# Patient Record
Sex: Male | Born: 1950
Health system: Southern US, Community
[De-identification: ages and names within clinical notes are randomized; demographics above are authoritative.]

## PROBLEM LIST (undated history)

## (undated) DIAGNOSIS — I10 Essential (primary) hypertension: Secondary | ICD-10-CM

## (undated) DIAGNOSIS — N210 Calculus in bladder: Secondary | ICD-10-CM

## (undated) DIAGNOSIS — I2510 Atherosclerotic heart disease of native coronary artery without angina pectoris: Secondary | ICD-10-CM

## (undated) DIAGNOSIS — J45909 Unspecified asthma, uncomplicated: Secondary | ICD-10-CM

## (undated) DIAGNOSIS — J301 Allergic rhinitis due to pollen: Secondary | ICD-10-CM

## (undated) DIAGNOSIS — Z973 Presence of spectacles and contact lenses: Secondary | ICD-10-CM

## (undated) DIAGNOSIS — T4145XA Adverse effect of unspecified anesthetic, initial encounter: Secondary | ICD-10-CM

## (undated) DIAGNOSIS — R112 Nausea with vomiting, unspecified: Secondary | ICD-10-CM

## (undated) DIAGNOSIS — L089 Local infection of the skin and subcutaneous tissue, unspecified: Secondary | ICD-10-CM

## (undated) DIAGNOSIS — M549 Dorsalgia, unspecified: Secondary | ICD-10-CM

## (undated) DIAGNOSIS — K219 Gastro-esophageal reflux disease without esophagitis: Secondary | ICD-10-CM

## (undated) DIAGNOSIS — M1990 Unspecified osteoarthritis, unspecified site: Secondary | ICD-10-CM

## (undated) DIAGNOSIS — G8929 Other chronic pain: Secondary | ICD-10-CM

## (undated) DIAGNOSIS — E785 Hyperlipidemia, unspecified: Secondary | ICD-10-CM

## (undated) DIAGNOSIS — Z860100 Personal history of colon polyps, unspecified: Secondary | ICD-10-CM

## (undated) DIAGNOSIS — C61 Malignant neoplasm of prostate: Principal | ICD-10-CM

## (undated) DIAGNOSIS — Z8546 Personal history of malignant neoplasm of prostate: Secondary | ICD-10-CM

## (undated) DIAGNOSIS — J189 Pneumonia, unspecified organism: Secondary | ICD-10-CM

## (undated) DIAGNOSIS — M6510 Other infective (teno)synovitis, unspecified site: Secondary | ICD-10-CM

## (undated) DIAGNOSIS — Z87442 Personal history of urinary calculi: Secondary | ICD-10-CM

## (undated) DIAGNOSIS — T7840XA Allergy, unspecified, initial encounter: Secondary | ICD-10-CM

## (undated) DIAGNOSIS — T8859XA Other complications of anesthesia, initial encounter: Secondary | ICD-10-CM

## (undated) DIAGNOSIS — E7800 Pure hypercholesterolemia, unspecified: Secondary | ICD-10-CM

## (undated) HISTORY — PX: KNEE ARTHROSCOPY: SUR90

## (undated) HISTORY — DX: Local infection of the skin and subcutaneous tissue, unspecified: L08.9

## (undated) HISTORY — PX: COLONOSCOPY: SHX174

## (undated) HISTORY — PX: ACHILLES TENDON SURGERY: SHX542

## (undated) HISTORY — PX: VASECTOMY: SHX75

## (undated) HISTORY — DX: Other infective (teno)synovitis, unspecified site: M65.10

## (undated) HISTORY — DX: Allergy, unspecified, initial encounter: T78.40XA

## (undated) HISTORY — PX: EYE SURGERY: SHX253

## (undated) HISTORY — PX: POSTERIOR LAMINECTOMY / DECOMPRESSION LUMBAR SPINE: SUR740

## (undated) HISTORY — PX: TONSILLECTOMY: SUR1361

## (undated) HISTORY — PX: FINGER SURGERY: SHX640

---
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
## 2002-06-01 HISTORY — PX: CARDIAC CATHETERIZATION: SHX172

## 2005-01-09 ENCOUNTER — Ambulatory Visit: Payer: Self-pay | Admitting: Internal Medicine

## 2005-01-27 ENCOUNTER — Ambulatory Visit: Payer: Self-pay | Admitting: Internal Medicine

## 2005-01-27 ENCOUNTER — Encounter (INDEPENDENT_AMBULATORY_CARE_PROVIDER_SITE_OTHER): Payer: Self-pay | Admitting: *Deleted

## 2006-08-17 ENCOUNTER — Ambulatory Visit (HOSPITAL_COMMUNITY): Admission: RE | Admit: 2006-08-17 | Discharge: 2006-08-17 | Payer: Self-pay | Admitting: Cardiology

## 2006-08-19 ENCOUNTER — Ambulatory Visit (HOSPITAL_COMMUNITY): Admission: RE | Admit: 2006-08-19 | Discharge: 2006-08-19 | Payer: Self-pay | Admitting: Cardiology

## 2007-05-25 ENCOUNTER — Ambulatory Visit (HOSPITAL_BASED_OUTPATIENT_CLINIC_OR_DEPARTMENT_OTHER): Admission: RE | Admit: 2007-05-25 | Discharge: 2007-05-25 | Payer: Self-pay | Admitting: Orthopedic Surgery

## 2009-09-03 DIAGNOSIS — G5600 Carpal tunnel syndrome, unspecified upper limb: Secondary | ICD-10-CM | POA: Insufficient documentation

## 2009-09-03 DIAGNOSIS — H539 Unspecified visual disturbance: Secondary | ICD-10-CM | POA: Insufficient documentation

## 2009-09-03 DIAGNOSIS — E785 Hyperlipidemia, unspecified: Secondary | ICD-10-CM | POA: Insufficient documentation

## 2009-09-03 DIAGNOSIS — M1990 Unspecified osteoarthritis, unspecified site: Secondary | ICD-10-CM | POA: Insufficient documentation

## 2009-12-19 DIAGNOSIS — K219 Gastro-esophageal reflux disease without esophagitis: Secondary | ICD-10-CM | POA: Insufficient documentation

## 2009-12-19 DIAGNOSIS — H04129 Dry eye syndrome of unspecified lacrimal gland: Secondary | ICD-10-CM | POA: Insufficient documentation

## 2009-12-19 DIAGNOSIS — J309 Allergic rhinitis, unspecified: Secondary | ICD-10-CM | POA: Insufficient documentation

## 2009-12-19 DIAGNOSIS — N529 Male erectile dysfunction, unspecified: Secondary | ICD-10-CM | POA: Insufficient documentation

## 2009-12-23 ENCOUNTER — Encounter: Payer: Self-pay | Admitting: Internal Medicine

## 2009-12-30 ENCOUNTER — Encounter: Payer: Self-pay | Admitting: Internal Medicine

## 2010-01-31 ENCOUNTER — Encounter (INDEPENDENT_AMBULATORY_CARE_PROVIDER_SITE_OTHER): Payer: Self-pay

## 2010-02-04 ENCOUNTER — Ambulatory Visit: Payer: Self-pay | Admitting: Internal Medicine

## 2010-02-07 ENCOUNTER — Telehealth (INDEPENDENT_AMBULATORY_CARE_PROVIDER_SITE_OTHER): Payer: Self-pay | Admitting: *Deleted

## 2010-02-19 ENCOUNTER — Ambulatory Visit: Payer: Self-pay | Admitting: Internal Medicine

## 2010-02-20 ENCOUNTER — Encounter: Payer: Self-pay | Admitting: Internal Medicine

## 2010-06-22 ENCOUNTER — Encounter: Payer: Self-pay | Admitting: Cardiovascular Disease

## 2010-07-01 NOTE — Letter (Signed)
Summary: Previsit letter  [facility]  [address] [place], [place] [address]   Phone: [phone]  Fax: [phone]       12/30/2009 MRN: [id number]  [facility] [address] [place], [place]  [address]  Dear Terry [name],  Welcome to the Gastroenterology Division at [facility].    You are scheduled to see a nurse for your pre-procedure visit on 02-04-10 at 4:30PM on the 3rd floor at [facility], [address]  We ask that you try to arrive at our office 15 minutes prior to your appointment time to allow for check-in.  Your nurse visit will consist of discussing your medical and surgical history, your immediate family medical history, and your medications.    Please bring a complete list of all your medications or, if you prefer, bring the medication bottles and we will list them.  We will need to be aware of both prescribed and over the counter drugs.  We will need to know exact dosage information as well.  If you are on blood thinners (Coumadin, Plavix, Aggrenox, Ticlid, etc.) please call our office today/prior to your appointment, as we need to consult with your physician about holding your medication.   Please be prepared to read and sign documents such as consent forms, a financial agreement, and acknowledgement forms.  If necessary, and with your consent, a friend or relative is welcome to sit-in on the nurse visit with you.  Please bring your insurance card so that we may make a copy of it.  If your insurance requires a referral to see a specialist, please bring your referral form from your primary care physician.  No co-pay is required for this nurse visit.     If you cannot keep your appointment, please call [phone] to cancel or reschedule prior to your appointment date.  This allows [place] the opportunity to schedule an appointment for another patient in need of care.    Thank you for choosing  Gastroenterology for your medical needs.   We appreciate the opportunity to care for you.  Please visit [place] at our website  to learn more about our practice.                     Sincerely.                                                                                                                   The Gastroenterology Division

## 2010-07-01 NOTE — Miscellaneous (Signed)
Summary: Lec previsit  Clinical Lists Changes  Medications: Added new medication of MOVIPREP 100 GM  SOLR (PEG-KCL-NACL-NASULF-NA ASC-C) As per prep instructions. - Signed Rx of MOVIPREP 100 GM  SOLR (PEG-KCL-NACL-NASULF-NA ASC-C) As per prep instructions.;  #1 x 0;  Signed;  Entered by: Ulis Rias RN;  Authorized by: Hilarie Fredrickson MD;  Method used: Electronically to [facility] # [address] [place], [place]  [address], Ph: [phone], Fax: [phone] Allergies: Added new allergy or adverse reaction of PENICILLIN Observations: Added new observation of ALLERGY REV: Done (02/04/2010 16:16) Added new observation of NKA: F (02/04/2010 16:16)    Prescriptions: MOVIPREP 100 GM  SOLR (PEG-KCL-NACL-NASULF-NA ASC-C) As per prep instructions.  #1 x 0   Entered by:   Ulis Rias RN   Authorized by:   Hilarie Fredrickson MD   Signed by:   Ulis Rias RN on 02/04/2010   Method used:   Electronically to        [facility] # [address]* (retail)       [address]       [place], [place]  [address]       Ph: [phone]       Fax: [phone]   RxID:   [id number]

## 2010-07-01 NOTE — Procedures (Signed)
Summary: Colonoscopy  Patient: [name] Note: All result statuses are Final unless otherwise noted.  Tests: (1) Colonoscopy (COL)   COL Colonoscopy           DONE     [name]     [address]     [place], [place]  [address]           COLONOSCOPY PROCEDURE REPORT           PATIENT:  Terry [name], Terry [name]  MR#:  [id number]     BIRTHDATE:  12-26-1950, 59 yrs. old  GENDER:  male     ENDOSCOPIST:  Wilhemina Bonito. Eda Keys, MD     REF. BY:  Surveillance Program Recall     PROCEDURE DATE:  02/19/2010     PROCEDURE:  Colonoscopy with snare polypectomy x 1     ASA CLASS:  Class II     INDICATIONS:  history of pre-cancerous (adenomatous) colon polyps,     surveillance and high-risk screening ; index exam 12-2004 w/ small     adenomas     MEDICATIONS:   Fentanyl 100 mcg IV, Versed 10 mg IV           DESCRIPTION OF PROCEDURE:   After the risks benefits and     alternatives of the procedure were thoroughly explained, informed     consent was obtained.  Digital rectal exam was performed and     revealed no abnormalities.   The LB CF-[id number] endoscope     was introduced through the anus and advanced to the cecum, which     was identified by both the appendix and ileocecal valve, without     limitations.Time to cecum = 2:55 min. The quality of the prep was     excellent, using MoviPrep.  The instrument was then slowly     withdrawn (time = 10:13 min) as the colon was fully examined.     <<PROCEDUREIMAGES>>           FINDINGS:  A diminutive polyp was found in the mid transverse     colon. Polyp was snared without cautery. Retrieval was successful.     Moderate diverticulosis was found in the sigmoid colon.     Retroflexed views in the rectum revealed no abnormalities.    The     scope was then withdrawn from the patient and the procedure     completed.           COMPLICATIONS:  None     ENDOSCOPIC IMPRESSION:     1) Diminutive polyp in the mid transverse colon - removed     2)  Moderate diverticulosis in the sigmoid colon           RECOMMENDATIONS:     1) Follow up colonoscopy in 5 years           ______________________________     Wilhemina Bonito. Eda Keys, MD           CC:  Creola Corn, MD; The Patient           n.     eSIGNED:   Wilhemina Bonito. Eda Keys at 02/19/2010 10:25 AM           Philis Fendt, [id number]  Note: An exclamation mark (!) indicates a result that was not dispersed into the flowsheet. Document Creation Date: 02/19/2010 10:26 AM _______________________________________________________________________  (1) Order result status: Final Collection or observation date-time: 02/19/2010 10:19 Requested date-time:  Receipt  date-time:  Reported date-time:  Referring Physician:   Ordering Physician: Fransico Setters [phone]) Specimen Source:  Source: Launa Grill Order Number: [id number] Lab site:   Appended Document: Colonoscopy     Procedures Next Due Date:    Colonoscopy: 03/2015

## 2010-07-01 NOTE — Letter (Signed)
Summary: Patient Notice- Polyp Results  [facility]  [address] [place], [place] [address]   Phone: [phone]  Fax: [phone]        February 20, 2010 MRN: [id number]    [facility] [address] [place], [place]  [address]    Dear Mr. Romey,  I am pleased to inform you that the colon polyp(s) removed during your recent colonoscopy was (were) found to be benign (no cancer detected) upon pathologic examination.  I recommend you have a repeat colonoscopy examination in 5 years to look for recurrent polyps, as having colon polyps increases your risk for having recurrent polyps or even colon cancer in the future.  Should you develop new or worsening symptoms of abdominal pain, bowel habit changes or bleeding from the rectum or bowels, please schedule an evaluation with either your primary care physician or with me.  Additional information/recommendations:  __ No further action with gastroenterology is needed at this time. Please      follow-up with your primary care physician for your other healthcare      needs.    Please call us if you are having persistent problems or have questions about your condition that have not been fully answered at this time.  Sincerely,  Hilarie Fredrickson MD  This letter has been electronically signed by your physician.  Appended Document: Patient Notice- Polyp Results letter mailed

## 2010-07-01 NOTE — Progress Notes (Signed)
Summary: prep concern  Phone Note Call from Patient Call back at Work Phone [phone]   Caller: Patient Call For: Dr. Marina Goodell Reason for Call: Talk to Nurse Summary of Call: prep is not at pharmacy... [facility] [address] Initial call taken by: Vallarie Mare,  February 07, 2010 1:58 PM  Follow-up for Phone Call        [facility] [address]  Left message on pt's voice mail. Follow-up by: Ezra Sites RN,  February 07, 2010 2:52 PM

## 2010-07-01 NOTE — Letter (Signed)
Summary: [facility] Instructions  [facility]  [address] [place], [place] [address]   Phone: [phone]  Fax: [phone]       Terry [name]    Oct 21, 1950    MRN: [id number]        Procedure Day Dorna Bloom:  Wednesday 02/19/2061     Arrival Time: 8:30 am      Procedure Time: 9:30 am     Location of Procedure:                    _x _  [facility] (4th Floor)                        PREPARATION FOR COLONOSCOPY WITH MOVIPREP   Starting 5 days prior to your procedure Friday 9/16 do not eat nuts, seeds, popcorn, corn, beans, peas,  salads, or any raw vegetables.  Do not take any fiber supplements (e.g. Metamucil, Citrucel, and Benefiber).  THE DAY BEFORE YOUR PROCEDURE         DATE: Tuesday 9/20  1.  Drink clear liquids the entire day-NO SOLID FOOD  2.  Do not drink anything colored red or purple.  Avoid juices with pulp.  No orange juice.  3.  Drink at least 64 oz. (8 glasses) of fluid/clear liquids during the day to prevent dehydration and help the prep work efficiently.  CLEAR LIQUIDS INCLUDE: Water Jello Ice Popsicles Tea (sugar ok, no milk/cream) Powdered fruit flavored drinks Coffee (sugar ok, no milk/cream) Gatorade Juice: apple, white grape, white cranberry  Lemonade Clear bullion, consomm, broth Carbonated beverages (any kind) Strained chicken noodle soup Hard Candy                             4.  In the morning, mix first dose of MoviPrep solution:    Empty 1 Pouch A and 1 Pouch B into the disposable container    Add lukewarm drinking water to the top line of the container. Mix to dissolve    Refrigerate (mixed solution should be used within 24 hrs)  5.  Begin drinking the prep at 5:00 p.m. The MoviPrep container is divided by 4 marks.   Every 15 minutes drink the solution down to the next mark (approximately 8 oz) until the full liter is complete.   6.  Follow completed prep with 16 oz of clear liquid of your choice (Nothing  red or purple).  Continue to drink clear liquids until bedtime.  7.  Before going to bed, mix second dose of MoviPrep solution:    Empty 1 Pouch A and 1 Pouch B into the disposable container    Add lukewarm drinking water to the top line of the container. Mix to dissolve    Refrigerate  THE DAY OF YOUR PROCEDURE      DATE: Wednesday 9/21 Beginning at 4:30 am (5 hours before procedure):         1. Every 15 minutes, drink the solution down to the next mark (approx 8 oz) until the full liter is complete.  2. Follow completed prep with 16 oz. of clear liquid of your choice.    3. You may drink clear liquids until 7:30 am (2 HOURS BEFORE PROCEDURE).   MEDICATION INSTRUCTIONS  Unless otherwise instructed, you should take regular prescription medications with a small sip of water   as early as possible the morning of your  procedure.          OTHER INSTRUCTIONS  You will need a responsible adult at least 62 years of age to accompany you and drive you home.   This person must remain in the waiting room during your procedure.  Wear loose fitting clothing that is easily removed.  Leave jewelry and other valuables at home.  However, you may wish to bring a book to read or  an iPod/MP3 player to listen to music as you wait for your procedure to start.  Remove all body piercing jewelry and leave at home.  Total time from sign-in until discharge is approximately 2-3 hours.  You should go home directly after your procedure and rest.  You can resume normal activities the  day after your procedure.  The day of your procedure you should not:   Drive   Make legal decisions   Operate machinery   Drink alcohol   Return to work  You will receive specific instructions about eating, activities and medications before you leave.    The above instructions have been reviewed and explained to me by   Ulis Rias RN  February 04, 2061 4:52 PM     I fully understand and can  verbalize these instructions _____________________________ Date _________

## 2010-07-01 NOTE — Letter (Signed)
Summary: Colonoscopy Letter  [facility]  [address] [place], [place] [address]   Phone: [phone]  Fax: [phone]      December 23, 2009 MRN: [id number]   [facility] [address] [place], [place]  [address]   Dear Ms. Hurtado,   According to your medical record, it is time for you to schedule a Colonoscopy. The American Cancer Society recommends this procedure as a method to detect early colon cancer. Patients with a family history of colon cancer, or a personal history of colon polyps or inflammatory bowel disease are at increased risk.  This letter has been generated based on the recommendations made at the time of your procedure. If you feel that in your particular situation this may no longer apply, please contact our office.  Please call our office at [phone] to schedule this appointment or to update your records at your earliest convenience.  Thank you for cooperating with [place] to provide you with the very best care possible.   Sincerely,  Wilhemina Bonito. Marina Goodell, M.D.  [facility] [phone]

## 2010-10-14 NOTE — Op Note (Signed)
Terry [name], Terry [name]                ACCOUNT NO.:  [id number]   MEDICAL RECORD NO.:  [id number]          PATIENT TYPE:  AMB   LOCATION:  DSC                          FACILITY:  [facility]   PHYSICIAN:  Harvie Junior, M.D.   DATE OF BIRTH:  06-28-1950   DATE OF PROCEDURE:  05/25/2007  DATE OF DISCHARGE:                               OPERATIVE REPORT   PREOPERATIVE DIAGNOSIS:  Lateral meniscal tear.   POSTOPERATIVE DIAGNOSES:  1. Partial posterior horn medial meniscal tear.  2. Partial posterior horn lateral meniscal tear.   PRINCIPAL PROCEDURE:  1. Partial posterior horn medial meniscectomy.  2. Partial posterior horn lateral meniscectomy.   SURGEON:  Harvie Junior, M.D.   ASSISTANT:   ANESTHESIA:  General.   BRIEF HISTORY:  [name] is a 60 year old male with long history of  having had significant left knee pain.  We treated conservatively for a  period of time.  He was having intermittent locking, catching, grabbing.  Because of continued complaints of pain, he was ultimately taken to the  operating room for operative knee arthroscopy.   PROCEDURE:  Patient was taken to the operating room.  After adequate  anesthesia was obtained with general __________ , patient placed supine  on the operating table.  The left leg was prepped and draped in the  usual sterile fashion.   Following this routine, arthroscopic examination of the knee revealed  that there was an obvious posterior horn medial meniscal tear with a  flap that was displacing into the knee.  This was debrided back to a  smooth and stable rim.   Attention was then turned laterally where there was a posterior horn  lateral meniscal tear which was debrided back to a smooth and stable  rim.  The articular cartilage both medial and lateral looked good.  Patellofemoral tracking was good, and patellofemoral cartilage looked  good.  There was some significant sort of debris in the knee.  It was  soft of soft tissue,  synovial; had a little bit of look like it could  have had some cartilage pieces, but I did not see any donor site within  the knee.   The knee was copiously and thoroughly irrigated with 6 liters of normal  saline irrigation, suctioned dry.  The arthroscopic portals were closed  with Band-Aids.  Sterile compressive dressing was applied.   Patient was taken to recovery room, was noted to be in satisfactory  condition.  Estimated blood loss for the procedure was __________      Harvie Junior, M.D.  Electronically Signed     JLG/MEDQ  D:  05/25/2007  T:  05/25/2007  Job:  [id number]

## 2010-12-29 DIAGNOSIS — F430 Acute stress reaction: Secondary | ICD-10-CM | POA: Insufficient documentation

## 2011-03-06 LAB — POCT HEMOGLOBIN-HEMACUE
Hemoglobin: 15.7 — ABNORMAL HIGH
Operator id: 128471

## 2011-09-09 ENCOUNTER — Other Ambulatory Visit: Payer: Self-pay

## 2011-09-09 DIAGNOSIS — M79672 Pain in left foot: Secondary | ICD-10-CM

## 2011-09-11 ENCOUNTER — Other Ambulatory Visit: Payer: Self-pay

## 2011-09-14 ENCOUNTER — Ambulatory Visit
Admission: RE | Admit: 2011-09-14 | Discharge: 2011-09-14 | Disposition: A | Discharge status: Home / Self Care | Payer: 59 | Source: Ambulatory Visit

## 2011-09-14 DIAGNOSIS — M79672 Pain in left foot: Secondary | ICD-10-CM

## 2012-04-05 DIAGNOSIS — M549 Dorsalgia, unspecified: Secondary | ICD-10-CM | POA: Insufficient documentation

## 2012-04-05 DIAGNOSIS — M79641 Pain in right hand: Secondary | ICD-10-CM | POA: Insufficient documentation

## 2013-01-23 DIAGNOSIS — R739 Hyperglycemia, unspecified: Secondary | ICD-10-CM | POA: Insufficient documentation

## 2013-01-23 DIAGNOSIS — R7402 Elevation of levels of lactic acid dehydrogenase (LDH): Secondary | ICD-10-CM | POA: Insufficient documentation

## 2013-01-23 DIAGNOSIS — Z5189 Encounter for other specified aftercare: Secondary | ICD-10-CM | POA: Insufficient documentation

## 2013-01-23 DIAGNOSIS — D229 Melanocytic nevi, unspecified: Secondary | ICD-10-CM | POA: Insufficient documentation

## 2013-01-23 DIAGNOSIS — Z1389 Encounter for screening for other disorder: Secondary | ICD-10-CM | POA: Insufficient documentation

## 2013-01-23 DIAGNOSIS — Z4889 Encounter for other specified surgical aftercare: Secondary | ICD-10-CM | POA: Insufficient documentation

## 2013-04-13 HISTORY — PX: PROSTATE BIOPSY: SHX241

## 2013-05-08 ENCOUNTER — Encounter: Payer: Self-pay | Admitting: Radiation Oncology

## 2013-05-08 DIAGNOSIS — C61 Malignant neoplasm of prostate: Secondary | ICD-10-CM | POA: Insufficient documentation

## 2013-05-08 NOTE — Progress Notes (Signed)
GU Location of Tumor / Histology:  prostate  If Prostate Cancer, Gleason Score is (3 + 3) and PSA is (5.08 on 03/22/13)  Patient presented 1.5 months ago with signs/symptoms of: elevated PSA  Biopsies of prostate (if applicable) revealed: adenocarcinoma, 1/12 cores positive for adenocarcinoma, right lateral mid core- small focus atypical glands, Gleason 3+3=6, volume 55.4 cc  Past/Anticipated interventions by urology, if any: none  Past/Anticipated interventions by medical oncology, if any: none  Weight changes, if any: no  Bowel/Bladder complaints, if any:  Hesitancy, straining to void, sensation of not emptying bladder, frequency, stream starts/stops, urgency, weak stream, strain to void, nocturia x 2, IPSS 18  Nausea/Vomiting, if any: no  Pain issues, if any:  History of pain underneath left testicle, pain "near my left kidney" x 2 years  SAFETY ISSUES:  Prior radiation? no  Pacemaker/ICD? no  Possible current pregnancy? na  Is the patient on methotrexate? no  Current Complaints / other details:  Married, business development/management, 2 children

## 2013-05-09 ENCOUNTER — Encounter: Payer: Self-pay | Admitting: Radiation Oncology

## 2013-05-09 ENCOUNTER — Ambulatory Visit
Admission: RE | Admit: 2013-05-09 | Discharge: 2013-05-09 | Disposition: A | Discharge status: Home / Self Care | Payer: 59 | Source: Ambulatory Visit | Attending: Radiation Oncology | Admitting: Radiation Oncology

## 2013-05-09 VITALS — BP 143/81 | HR 71 | Temp 99.1°F | Resp 20 | Ht 68.0 in | Wt 180.0 lb

## 2013-05-09 DIAGNOSIS — C61 Malignant neoplasm of prostate: Secondary | ICD-10-CM

## 2013-05-09 HISTORY — DX: Malignant neoplasm of prostate: C61

## 2013-05-09 HISTORY — DX: Unspecified asthma, uncomplicated: J45.909

## 2013-05-09 HISTORY — DX: Gastro-esophageal reflux disease without esophagitis: K21.9

## 2013-05-09 NOTE — Progress Notes (Signed)
[facility] Oncology NEW PATIENT EVALUATION  Name: Terry [name] MRN: [id number]  Date:   05/09/2013           DOB: 1950-11-09  Status: outpatient   CC:   Terry Mc, MD Dr. Creola [name]   REFERRING PHYSICIAN: [name], MD   DIAGNOSIS: Stage TI C. favorable risk adenocarcinoma prostate.   HISTORY OF PRESENT ILLNESS:  Terry [name] is a 62 y.o. male who is seen today for the courtesy of Dr. Heloise [name] for discussion of possible radiation therapy in the management of his stage TI C. favorable risk adenocarcinoma prostate. His was have an elevated PSA of 5.1 prompting ultrasound-guided needle biopsies by Dr. Laverle Patter on 04/13/2013. He was found to have Gleason's 6 (3+3) involving 60% of one core from the right base. Remaining 11 biopsies were benign although there was a small focus of atypical glands within the right lateral mid gland. His gland volume was 55.4 cc. His I PSS score today is 18 and was noted to be 14 when initially seen by Dr. Laverle Patter. He is potent, but not sexually active. No GI difficulties.    PREVIOUS RADIATION THERAPY: No   PAST MEDICAL HISTORY:  has a past medical history of Prostate cancer (04/13/13); Asthma; GERD (gastroesophageal reflux disease); and Joint pain.     PAST SURGICAL HISTORY:  Past Surgical History  Procedure Laterality Date  . Eye surgery      lazy eye as child, lasiks surgery  . Knee arthroscopy Bilateral   . Finger surgery      tendon sheath  . Prostate biopsy  04/13/13    gleason 3+3=6, vol 55.4 cc     FAMILY HISTORY: family history includes Dementia in his mother; Heart attack in his father; Heart disease in his father; Pneumonia in his mother. His mother died from pneumonia after having dementia at age 31. His father died of a heart attack at 89. No family history of prostate cancer.   SOCIAL HISTORY:  reports that he has never smoked. He has never used smokeless tobacco. He reports that he drinks alcohol. He  reports that he does not use illicit drugs. Married, 2 children. 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   ALLERGIES: Penicillins   MEDICATIONS:  Current Outpatient Prescriptions  Medication Sig Dispense Refill  . cetirizine (ZYRTEC) 10 MG tablet Take 10 mg by mouth daily.      . cycloSPORINE (RESTASIS) 0.05 % ophthalmic emulsion 1 drop 2 (two) times daily.      Marland Kitchen ibuprofen (ADVIL,MOTRIN) 800 MG tablet Take 800 mg by mouth every 8 (eight) hours as needed.      Boris Lown Oil 1000 MG CAPS Take by mouth.      . mometasone (NASONEX) 50 MCG/ACT nasal spray Place 2 sprays into the nose daily.      . Multiple Vitamins-Minerals (CENTRUM SILVER ADULT 50+ PO) Take by mouth.      Marland Kitchen omeprazole (PRILOSEC) 20 MG capsule Take 20 mg by mouth daily.      . rosuvastatin (CRESTOR) 20 MG tablet Take 20 mg by mouth daily.       No current facility-administered medications for this encounter.     REVIEW OF SYSTEMS:  Pertinent items are noted in HPI.    PHYSICAL EXAM:  height is 5\' 8"  (1.727 m) and weight is 180 lb (81.647 kg). His oral temperature is 99.1 F (37.3 C). His blood pressure is 143/81 and his pulse  is 71. His respiration is 20.   Head and neck examination: Grossly unremarkable. Nodes: Without palpable cervical or supraclavicular lymphadenopathy. Chest: Lungs clear. Back: Without spinal or CVA tenderness. Abdomen: Without masses organomegaly. Genitalia: Unremarkable to inspection. Rectal: The prostate gland is slightly enlarged and is without focal induration or nodularity. Extremities: Without edema.   LABORATORY DATA:  Lab Results  Component Value Date   HGB 15.7* 05/25/2007   No results found for this basename: NA, K, CL, CO2   No results found for this basename: ALT, AST, GGT, ALKPHOS, BILITOT   PSA 5.08 from 03/22/2013.   IMPRESSION: Stage TI C. favorable risk adenocarcinoma prostate. I explained to the patient and his wife that his prognosis is related to his stage, PSA  level, and Gleason score. All are favorable. Other prognostic factors include PSA doubling time and disease volume. These also appear to be favorable. Treatment options include surgery, close surveillance/observation, and radiation therapy. Radiation therapy options include seed implantation alone or 8 weeks of external beam/IMRT. We discussed the potential acute and late toxicities of radiation therapy. He is relatively healthy and has at least a 10 year life expectancy, so I would favor potentially curative treatment. He is not an ideal candidate for seed implantation in view of his obstructive symptomatology. His I PSS score today is 18. This has been a chronic problem for him. Before visiting today, he was trying to decide between surgery and seed implantation. I recommend surgery and he is comfortable with this approach .   PLAN: As discussed above. He'll contact Dr. Laverle Patter for scheduling of his surgery.  I spent 60 minutes minutes face to face with the patient and more than 50% of that time was spent in counseling and/or coordination of care.

## 2013-05-09 NOTE — Progress Notes (Signed)
Please see the Nurse Progress Note in the MD Initial Consult Encounter for this patient. 

## 2013-05-10 ENCOUNTER — Other Ambulatory Visit: Payer: Self-pay | Admitting: Urology

## 2013-05-17 ENCOUNTER — Other Ambulatory Visit (HOSPITAL_COMMUNITY): Payer: Self-pay | Admitting: Urology

## 2013-05-17 ENCOUNTER — Encounter (HOSPITAL_COMMUNITY): Payer: Self-pay | Admitting: Pharmacy Technician

## 2013-05-17 NOTE — Patient Instructions (Signed)
20 REMO KIRSCHENMANN  05/17/2013   Your procedure is scheduled on: 05/22/13 MONDAY   Report to [facility] at     0515  AM.  Call this number if you have problems the morning of surgery: [phone]       Remember: BOWEL PREP AS DIRECTED BY OFFICE  Do not eat food  Or drink :After Midnight. Sunday NIGHT   Take these medicines the morning of surgery with A SIP OF WATER: OMEPRAZOLE   .  Contacts, dentures or partial plates can not be worn to surgery  Leave suitcase in the car. After surgery it may be brought to your room.  For patients admitted to the hospital, checkout time is 11:00 AM day of  discharge.             SPECIAL INSTRUCTIONS- SEE [place] PREPARING FOR SURGERY INSTRUCTION SHEET-     DO NOT WEAR JEWELRY, LOTIONS, POWDERS, OR PERFUMES.  WOMEN-- DO NOT SHAVE LEGS OR UNDERARMS FOR 12 HOURS BEFORE SHOWERS. MEN MAY SHAVE FACE.  Patients discharged the day of surgery will not be allowed to drive home. IF going home the day of surgery, you must have a driver and someone to stay with you for the first 24 hours  Name and phone number of your driver:   admission                                                                     Please read over the following fact sheets that you were given:  Incentive Spirometry Sheet, Blood Transfusion Sheet  Information                                                                                   Aliviyah Malanga  PST [phone]  [phone]                 FAILURE TO FOLLOW THESE INSTRUCTIONS MAY RESULT IN  CANCELLATION   OF YOUR SURGERY                                                  Patient Signature _____________________________

## 2013-05-18 ENCOUNTER — Encounter (HOSPITAL_COMMUNITY): Payer: Self-pay

## 2013-05-18 ENCOUNTER — Ambulatory Visit (HOSPITAL_COMMUNITY)
Admission: RE | Admit: 2013-05-18 | Discharge: 2013-05-18 | Disposition: A | Discharge status: Home / Self Care | Payer: 59 | Source: Ambulatory Visit | Attending: Urology | Admitting: Urology

## 2013-05-18 ENCOUNTER — Encounter (HOSPITAL_COMMUNITY)
Admission: RE | Admit: 2013-05-18 | Discharge: 2013-05-18 | Disposition: A | Discharge status: Home / Self Care | Payer: 59 | Source: Ambulatory Visit | Attending: Urology | Admitting: Urology

## 2013-05-18 DIAGNOSIS — M40299 Other kyphosis, site unspecified: Secondary | ICD-10-CM | POA: Insufficient documentation

## 2013-05-18 DIAGNOSIS — Z0183 Encounter for blood typing: Secondary | ICD-10-CM | POA: Insufficient documentation

## 2013-05-18 DIAGNOSIS — M439 Deforming dorsopathy, unspecified: Secondary | ICD-10-CM | POA: Insufficient documentation

## 2013-05-18 DIAGNOSIS — C61 Malignant neoplasm of prostate: Secondary | ICD-10-CM | POA: Insufficient documentation

## 2013-05-18 DIAGNOSIS — Z01810 Encounter for preprocedural cardiovascular examination: Secondary | ICD-10-CM | POA: Insufficient documentation

## 2013-05-18 DIAGNOSIS — Z01818 Encounter for other preprocedural examination: Secondary | ICD-10-CM | POA: Insufficient documentation

## 2013-05-18 DIAGNOSIS — Z01812 Encounter for preprocedural laboratory examination: Secondary | ICD-10-CM | POA: Insufficient documentation

## 2013-05-18 HISTORY — DX: Unspecified osteoarthritis, unspecified site: M19.90

## 2013-05-18 HISTORY — DX: Pure hypercholesterolemia, unspecified: E78.00

## 2013-05-18 LAB — CBC
HCT: 43.1 % (ref 39.0–52.0)
Hemoglobin: 15.2 g/dL (ref 13.0–17.0)
MCH: 29.2 pg (ref 26.0–34.0)
MCV: 82.7 fL (ref 78.0–100.0)
Platelets: 226 10*3/uL (ref 150–400)
RBC: 5.21 MIL/uL (ref 4.22–5.81)
RDW: 13.1 % (ref 11.5–15.5)
WBC: 7.2 10*3/uL (ref 4.0–10.5)

## 2013-05-18 LAB — BASIC METABOLIC PANEL
CO2: 25 mEq/L (ref 19–32)
Calcium: 9.9 mg/dL (ref 8.4–10.5)
Creatinine, Ser: 0.96 mg/dL (ref 0.50–1.35)
Glucose, Bld: 107 mg/dL — ABNORMAL HIGH (ref 70–99)
Potassium: 3.8 mEq/L (ref 3.5–5.1)

## 2013-05-18 LAB — ABO/RH: ABO/RH(D): B NEG

## 2013-05-18 NOTE — Progress Notes (Signed)
Faxed chest x ray report to Dr Laverle Patter by [facility]

## 2013-05-18 NOTE — Progress Notes (Signed)
Dr Laverle Patter-  Cefazolin is ordered pre op-  PT STATES HAS HIVES WITH PENICILLIN--please clarify if EPIC if you wish to make any changes.. Thank you

## 2013-05-21 ENCOUNTER — Encounter (HOSPITAL_COMMUNITY): Payer: Self-pay | Admitting: Anesthesiology

## 2013-05-21 NOTE — H&P (Signed)
History of Present Illness Terry [name] is a 62 year old who was noted to have an elevated PSA of 5.1 prompting a prostate needle biopsy on 04/13/13 which confirmed Gleason 3+3=6 adenocarcinoma of the prostate in 1 out of 12 biopsy cores. He has no family history of prostate cancer.    TNM stage: cT1c Nx Mx  PSA: 5.1  Gleason score: 3+3=6  Biopsy (04/13/13): 1/12 cores -- R base (60%)  Prostate volume: 55.4 cc  PSAD: 0.09    Nomogram  OC disease: 88%  EPE: 8%  SVI: 1%  LNI: 1.4%  PFS (surgery): 98% at 5 years, 97% at 10 years    Urinary function: He has moderate voiding symptoms including hesitancy and straining. IPSS is 14.  Erectile function: SHIM score is 20.    Interval history:    He follows up today for further discussion regarding his treatment options for prostate cancer. He recently saw Dr. Dayton Scrape and discussed his radiation therapy options. After further discussion and considering his baseline voiding symptoms, he has elected to proceed with surgical treatment.     Past Medical History Problems  1. History of Asthma (493.90) 2. History of esophageal reflux (V12.79)  Surgical History Problems  1. History of Arthroscopy Knee 2. History of Eye Surgery 3. History of Hand Incision Tendon Sheath Of A Finger  Current Meds 1. Centrum Silver Adult 50+ Oral Tablet;  Therapy: (Recorded:22Oct2014) to Recorded 2. Crestor 20 MG Oral Tablet;  Therapy: (Recorded:22Oct2014) to Recorded 3. Ibuprofen 800 MG Oral Tablet;  Therapy: 07Jul2014 to Recorded 4. Krill Oil 1000 MG Oral Capsule;  Therapy: (Recorded:22Oct2014) to Recorded 5. Nasonex 50 MCG/ACT Nasal Suspension;  Therapy: 09Sep2014 to Recorded 6. PriLOSEC OTC 20 MG Oral Tablet Delayed Release;  Therapy: (Recorded:22Oct2014) to Recorded 7. Restasis 0.05 % Ophthalmic Emulsion;  Therapy: (Recorded:22Oct2014) to Recorded 8. ZyrTEC Allergy TABS;  Therapy: (Recorded:22Oct2014) to  Recorded  Allergies Medication  1. Penicillins  Family History Problems  1. Family history of Bacterial Pneumonia : Mother 2. Family history of Heart Disease (V17.49) : Father  Social History Problems  1. Alcohol Use   up to 1 serving daily - not an every drinker 2. Caffeine Use   up to 2 cups of coffee daily 3. Marital History - Currently Married 4. Never A Smoker 5. Occupation:   business Counsellor / Geologist, engineering Vital Signs [Data Includes: Last 1 Day]  Recorded: 15Dec2014 04:03PM  Blood Pressure: 156 / 87 Heart Rate: 72  Physical Exam Constitutional: Well nourished and well developed . No acute distress.  ENT:. The ears and nose are normal in appearance.  Neck: The appearance of the neck is normal and no neck mass is present.  Pulmonary: No respiratory distress, normal respiratory rhythm and effort and clear bilateral breath sounds.  Cardiovascular: Heart rate and rhythm are normal . No peripheral edema.  Abdomen: The abdomen is soft and nontender. No masses are palpated. No CVA tenderness. No hernias are palpable. No hepatosplenomegaly noted.  Skin: Normal skin turgor, no visible rash and no visible skin lesions.  Neuro/Psych:. Mood and affect are appropriate.    Results/Data Urine [Data Includes: Last 1 Day]   15Dec2014  COLOR YELLOW   APPEARANCE CLEAR   SPECIFIC GRAVITY 1.025   pH 6.0   GLUCOSE NEG mg/dL  BILIRUBIN NEG   KETONE NEG mg/dL  BLOOD NEG   PROTEIN NEG mg/dL  UROBILINOGEN 0.2 mg/dL  NITRITE NEG   LEUKOCYTE ESTERASE TRACE   SQUAMOUS EPITHELIAL/HPF NONE SEEN  WBC 3-6 WBC/hpf  RBC 0-2 RBC/hpf  BACTERIA NONE SEEN   CRYSTALS NONE SEEN   CASTS NONE SEEN    Assessment Assessed  1. Prostate cancer (185)  Plan Health Maintenance  1. UA With REFLEX; Status:Complete;   Done: 15Dec2014 03:38PM Prostate cancer  2. Follow-up Keep Future Appt Office  Follow-up  Status: Complete  Done: 15Dec2014  Discussion/Summary 1. Prostate cancer:  He has elected to proceed with surgical treatment of his prostate cancer and has been scheduled for a bilateral nerve sparing robotic-assisted laparoscopic radical prostatectomy.   We discussed surgical therapy for prostate cancer including the different available surgical approaches. We discussed, in detail, the risks and expectations of surgery with regard to cancer control, urinary control, and erectile function as well as the expected postoperative recovery process. Additional risks of surgery including but not limited to bleeding, infection, hernia formation, nerve damage, lymphocele formation, bowel/rectal injury potentially necessitating colostomy, damage to the urinary tract resulting in urine leakage, urethral stricture, and the cardiopulmonary risks such as myocardial infarction, stroke, death, venothromboembolism, etc. were explained. The risk of open surgical conversion for robotic/laparoscopic prostatectomy was also discussed.     All of his questions were answered to be stated satisfaction. Surgery will be scheduled for the near future.    Cc: Dr. Creola Corn  A total of 35 minutes were spent in the overall care of the patient today with 35 minutes in direct face to face consultation.    Signatures Electronically signed by : Heloise Purpura, M.D.; May 15 2013  5:03PM EST

## 2013-05-21 NOTE — Anesthesia Preprocedure Evaluation (Addendum)
Anesthesia Evaluation  Patient identified by MRN, date of birth, ID band Patient awake    Reviewed: Allergy & Precautions, H&P , NPO status , Patient's Chart, lab work & pertinent test results  Airway Mallampati: II TM Distance: >3 FB Neck ROM: Full    Dental no notable dental hx.    Pulmonary asthma ,  breath sounds clear to auscultation  Pulmonary exam normal       Cardiovascular Exercise Tolerance: Good negative cardio ROS  Rhythm:Regular Rate:Normal  ECG and CXR reviewed.   Neuro/Psych negative neurological ROS  negative psych ROS   GI/Hepatic Neg liver ROS, GERD-  Medicated,  Endo/Other  negative endocrine ROS  Renal/GU negative Renal ROS  negative genitourinary   Musculoskeletal negative musculoskeletal ROS (+)   Abdominal   Peds negative pediatric ROS (+)  Hematology negative hematology ROS (+)   Anesthesia Other Findings   Reproductive/Obstetrics negative OB ROS                          Anesthesia Physical Anesthesia Plan  ASA: II  Anesthesia Plan: General   Post-op Pain Management:    Induction: Intravenous  Airway Management Planned: Oral ETT  Additional Equipment:   Intra-op Plan:   Post-operative Plan: Extubation in OR  Informed Consent: I have reviewed the patients History and Physical, chart, labs and discussed the procedure including the risks, benefits and alternatives for the proposed anesthesia with the patient or authorized representative who has indicated his/her understanding and acceptance.   Dental advisory given  Plan Discussed with: CRNA  Anesthesia Plan Comments:         Anesthesia Quick Evaluation

## 2013-05-22 ENCOUNTER — Encounter (HOSPITAL_COMMUNITY): Payer: 59 | Admitting: Anesthesiology

## 2013-05-22 ENCOUNTER — Inpatient Hospital Stay (HOSPITAL_COMMUNITY)
Admission: RE | Admit: 2013-05-22 | Discharge: 2013-05-23 | DRG: 708 | Disposition: A | Discharge status: Home / Self Care | Payer: 59 | Source: Ambulatory Visit | Attending: Urology | Admitting: Urology

## 2013-05-22 ENCOUNTER — Encounter (HOSPITAL_COMMUNITY)
Admission: RE | Disposition: A | Discharge status: Home / Self Care | Payer: Self-pay | Source: Ambulatory Visit | Attending: Urology

## 2013-05-22 ENCOUNTER — Encounter (HOSPITAL_COMMUNITY): Payer: Self-pay | Admitting: *Deleted

## 2013-05-22 ENCOUNTER — Inpatient Hospital Stay (HOSPITAL_COMMUNITY): Payer: 59 | Admitting: Anesthesiology

## 2013-05-22 DIAGNOSIS — Z8249 Family history of ischemic heart disease and other diseases of the circulatory system: Secondary | ICD-10-CM

## 2013-05-22 DIAGNOSIS — R3911 Hesitancy of micturition: Secondary | ICD-10-CM | POA: Diagnosis present

## 2013-05-22 DIAGNOSIS — K219 Gastro-esophageal reflux disease without esophagitis: Secondary | ICD-10-CM | POA: Diagnosis present

## 2013-05-22 DIAGNOSIS — C61 Malignant neoplasm of prostate: Principal | ICD-10-CM | POA: Diagnosis present

## 2013-05-22 DIAGNOSIS — J45909 Unspecified asthma, uncomplicated: Secondary | ICD-10-CM | POA: Diagnosis present

## 2013-05-22 HISTORY — PX: ROBOT ASSISTED LAPAROSCOPIC RADICAL PROSTATECTOMY: SHX5141

## 2013-05-22 LAB — HEMOGLOBIN AND HEMATOCRIT, BLOOD: Hemoglobin: 14 g/dL (ref 13.0–17.0)

## 2013-05-22 LAB — TYPE AND SCREEN: Antibody Screen: NEGATIVE

## 2013-05-22 SURGERY — ROBOTIC ASSISTED LAPAROSCOPIC RADICAL PROSTATECTOMY LEVEL 1
Anesthesia: General | Site: Abdomen

## 2013-05-22 MED ORDER — HYDROMORPHONE HCL PF 1 MG/ML IJ SOLN
0.2500 mg | INTRAMUSCULAR | Status: DC | PRN
  Administered 2013-05-22 (×2): 0.25 mg via INTRAVENOUS

## 2013-05-22 MED ORDER — DIPHENHYDRAMINE HCL 50 MG/ML IJ SOLN: 12.5000 mg | Freq: Four times a day (QID) | INTRAMUSCULAR | Status: DC | PRN

## 2013-05-22 MED ORDER — SUFENTANIL CITRATE 50 MCG/ML IV SOLN
INTRAVENOUS | Status: DC | PRN
  Administered 2013-05-22: 10 ug via INTRAVENOUS
  Administered 2013-05-22 (×2): 5 ug via INTRAVENOUS
  Administered 2013-05-22: 10 ug via INTRAVENOUS
  Administered 2013-05-22: 5 ug via INTRAVENOUS
  Administered 2013-05-22 (×6): 10 ug via INTRAVENOUS
  Administered 2013-05-22: 5 ug via INTRAVENOUS
  Administered 2013-05-22: 10 ug via INTRAVENOUS

## 2013-05-22 MED ORDER — ZOLPIDEM TARTRATE 5 MG PO TABS
5.0000 mg | ORAL_TABLET | Freq: Every evening | ORAL | Status: DC | PRN
  Administered 2013-05-22: 5 mg via ORAL
  Filled 2013-05-22: qty 1

## 2013-05-22 MED ORDER — ONDANSETRON HCL 4 MG/2ML IJ SOLN
INTRAMUSCULAR | Status: AC
Start: 2013-05-22 — End: 2013-05-22
  Filled 2013-05-22: qty 2

## 2013-05-22 MED ORDER — PROMETHAZINE HCL 25 MG/ML IJ SOLN: 6.2500 mg | INTRAMUSCULAR | Status: DC | PRN

## 2013-05-22 MED ORDER — LIDOCAINE HCL (CARDIAC) 20 MG/ML IV SOLN
INTRAVENOUS | Status: AC
  Filled 2013-05-22: qty 5

## 2013-05-22 MED ORDER — DEXAMETHASONE SODIUM PHOSPHATE 10 MG/ML IJ SOLN
INTRAMUSCULAR | Status: AC
Start: 2013-05-22 — End: 2013-05-22
  Filled 2013-05-22: qty 1

## 2013-05-22 MED ORDER — CIPROFLOXACIN HCL 500 MG PO TABS: 500.0000 mg | ORAL_TABLET | Freq: Two times a day (BID) | ORAL | Status: DC

## 2013-05-22 MED ORDER — GLYCOPYRROLATE 0.2 MG/ML IJ SOLN
INTRAMUSCULAR | Status: AC
  Filled 2013-05-22: qty 3

## 2013-05-22 MED ORDER — PANTOPRAZOLE SODIUM 40 MG PO TBEC
40.0000 mg | DELAYED_RELEASE_TABLET | Freq: Every day | ORAL | Status: DC
  Administered 2013-05-23: 10:00:00 40 mg via ORAL
  Filled 2013-05-22: qty 1

## 2013-05-22 MED ORDER — HYDROCODONE-ACETAMINOPHEN 5-325 MG PO TABS: 1.0000 | ORAL_TABLET | Freq: Four times a day (QID) | ORAL | Status: DC | PRN

## 2013-05-22 MED ORDER — LACTATED RINGERS IV SOLN: INTRAVENOUS | Status: DC

## 2013-05-22 MED ORDER — SODIUM CHLORIDE 0.9 % IJ SOLN
INTRAMUSCULAR | Status: AC
  Filled 2013-05-22: qty 10

## 2013-05-22 MED ORDER — PROPOFOL 10 MG/ML IV BOLUS
INTRAVENOUS | Status: AC
  Filled 2013-05-22: qty 20

## 2013-05-22 MED ORDER — ACETAMINOPHEN 325 MG PO TABS: 650.0000 mg | ORAL_TABLET | ORAL | Status: DC | PRN

## 2013-05-22 MED ORDER — LIDOCAINE HCL (CARDIAC) 20 MG/ML IV SOLN
INTRAVENOUS | Status: DC | PRN
  Administered 2013-05-22: 80 mg via INTRAVENOUS
  Administered 2013-05-22: 20 mg via INTRAVENOUS

## 2013-05-22 MED ORDER — LACTATED RINGERS IV SOLN
INTRAVENOUS | Status: DC | PRN
  Administered 2013-05-22: 08:00:00

## 2013-05-22 MED ORDER — SUFENTANIL CITRATE 50 MCG/ML IV SOLN
INTRAVENOUS | Status: AC
  Filled 2013-05-22: qty 1

## 2013-05-22 MED ORDER — KCL IN DEXTROSE-NACL 20-5-0.45 MEQ/L-%-% IV SOLN
INTRAVENOUS | Status: DC
  Administered 2013-05-22 – 2013-05-23 (×3): via INTRAVENOUS
  Filled 2013-05-22 (×5): qty 1000

## 2013-05-22 MED ORDER — DEXAMETHASONE SODIUM PHOSPHATE 10 MG/ML IJ SOLN
INTRAMUSCULAR | Status: AC
  Filled 2013-05-22: qty 1

## 2013-05-22 MED ORDER — DOCUSATE SODIUM 100 MG PO CAPS
100.0000 mg | ORAL_CAPSULE | Freq: Two times a day (BID) | ORAL | Status: DC
  Administered 2013-05-22 – 2013-05-23 (×2): 100 mg via ORAL
  Filled 2013-05-22 (×3): qty 1

## 2013-05-22 MED ORDER — GLYCOPYRROLATE 0.2 MG/ML IJ SOLN
INTRAMUSCULAR | Status: DC | PRN
  Administered 2013-05-22: .6 mg via INTRAVENOUS

## 2013-05-22 MED ORDER — KETOROLAC TROMETHAMINE 15 MG/ML IJ SOLN
15.0000 mg | Freq: Four times a day (QID) | INTRAMUSCULAR | Status: DC
  Administered 2013-05-22 – 2013-05-23 (×3): 15 mg via INTRAVENOUS
  Filled 2013-05-22 (×5): qty 1

## 2013-05-22 MED ORDER — MIDAZOLAM HCL 5 MG/5ML IJ SOLN
INTRAMUSCULAR | Status: DC | PRN
  Administered 2013-05-22: 2 mg via INTRAVENOUS

## 2013-05-22 MED ORDER — ONDANSETRON HCL 4 MG/2ML IJ SOLN
INTRAMUSCULAR | Status: AC
  Filled 2013-05-22: qty 2

## 2013-05-22 MED ORDER — LABETALOL HCL 5 MG/ML IV SOLN
INTRAVENOUS | Status: DC | PRN
  Administered 2013-05-22 (×2): 5 mg via INTRAVENOUS

## 2013-05-22 MED ORDER — HEPARIN SODIUM (PORCINE) 1000 UNIT/ML IJ SOLN
INTRAMUSCULAR | Status: AC
  Filled 2013-05-22: qty 1

## 2013-05-22 MED ORDER — SUCCINYLCHOLINE CHLORIDE 20 MG/ML IJ SOLN
INTRAMUSCULAR | Status: DC | PRN
  Administered 2013-05-22: 100 mg via INTRAVENOUS

## 2013-05-22 MED ORDER — DEXAMETHASONE SODIUM PHOSPHATE 10 MG/ML IJ SOLN
INTRAMUSCULAR | Status: DC | PRN
  Administered 2013-05-22: 10 mg via INTRAVENOUS

## 2013-05-22 MED ORDER — SUCCINYLCHOLINE CHLORIDE 20 MG/ML IJ SOLN
INTRAMUSCULAR | Status: AC
  Filled 2013-05-22: qty 1

## 2013-05-22 MED ORDER — SODIUM CHLORIDE 0.9 % IV BOLUS (SEPSIS)
1000.0000 mL | Freq: Once | INTRAVENOUS | Status: AC
  Administered 2013-05-22: 1000 mL via INTRAVENOUS

## 2013-05-22 MED ORDER — MORPHINE SULFATE 2 MG/ML IJ SOLN
2.0000 mg | INTRAMUSCULAR | Status: DC | PRN
  Administered 2013-05-22 – 2013-05-23 (×4): 2 mg via INTRAVENOUS
  Filled 2013-05-22 (×4): qty 1

## 2013-05-22 MED ORDER — CEFAZOLIN SODIUM-DEXTROSE 2-3 GM-% IV SOLR
2.0000 g | INTRAVENOUS | Status: AC
  Administered 2013-05-22: 2 g via INTRAVENOUS

## 2013-05-22 MED ORDER — MIDAZOLAM HCL 2 MG/2ML IJ SOLN
INTRAMUSCULAR | Status: AC
  Filled 2013-05-22: qty 2

## 2013-05-22 MED ORDER — ONDANSETRON HCL 4 MG/2ML IJ SOLN
INTRAMUSCULAR | Status: DC | PRN
  Administered 2013-05-22: 4 mg via INTRAVENOUS

## 2013-05-22 MED ORDER — HYDROMORPHONE HCL PF 2 MG/ML IJ SOLN
INTRAMUSCULAR | Status: AC
  Filled 2013-05-22: qty 1

## 2013-05-22 MED ORDER — ROCURONIUM BROMIDE 100 MG/10ML IV SOLN
INTRAVENOUS | Status: AC
  Filled 2013-05-22: qty 1

## 2013-05-22 MED ORDER — HYDROMORPHONE HCL PF 1 MG/ML IJ SOLN
INTRAMUSCULAR | Status: DC | PRN
  Administered 2013-05-22 (×2): 1 mg via INTRAVENOUS

## 2013-05-22 MED ORDER — CYCLOSPORINE 0.05 % OP EMUL
1.0000 [drp] | Freq: Two times a day (BID) | OPHTHALMIC | Status: DC
  Administered 2013-05-23: 10:00:00 1 [drp] via OPHTHALMIC
  Filled 2013-05-22 (×3): qty 1

## 2013-05-22 MED ORDER — DOCUSATE SODIUM 100 MG PO CAPS: 100.0000 mg | ORAL_CAPSULE | Freq: Two times a day (BID) | ORAL | Status: DC

## 2013-05-22 MED ORDER — BUPIVACAINE-EPINEPHRINE 0.25% -1:200000 IJ SOLN
INTRAMUSCULAR | Status: DC | PRN
  Administered 2013-05-22: 30 mL

## 2013-05-22 MED ORDER — NEOSTIGMINE METHYLSULFATE 1 MG/ML IJ SOLN
INTRAMUSCULAR | Status: AC
  Filled 2013-05-22: qty 10

## 2013-05-22 MED ORDER — FLUTICASONE PROPIONATE 50 MCG/ACT NA SUSP
1.0000 | Freq: Every day | NASAL | Status: DC
  Administered 2013-05-23: 1 via NASAL
  Filled 2013-05-22: qty 16

## 2013-05-22 MED ORDER — HYDROMORPHONE HCL PF 1 MG/ML IJ SOLN
INTRAMUSCULAR | Status: AC
  Filled 2013-05-22: qty 1

## 2013-05-22 MED ORDER — SODIUM CHLORIDE 0.9 % IJ SOLN
INTRAMUSCULAR | Status: AC
  Filled 2013-05-22: qty 20

## 2013-05-22 MED ORDER — DIPHENHYDRAMINE HCL 12.5 MG/5ML PO ELIX: 12.5000 mg | ORAL_SOLUTION | Freq: Four times a day (QID) | ORAL | Status: DC | PRN

## 2013-05-22 MED ORDER — CEFAZOLIN SODIUM 1-5 GM-% IV SOLN
1.0000 g | Freq: Three times a day (TID) | INTRAVENOUS | Status: AC
  Administered 2013-05-22 (×2): 1 g via INTRAVENOUS
  Filled 2013-05-22 (×2): qty 50

## 2013-05-22 MED ORDER — NEOSTIGMINE METHYLSULFATE 1 MG/ML IJ SOLN
INTRAMUSCULAR | Status: DC | PRN
  Administered 2013-05-22: 4 mg via INTRAVENOUS

## 2013-05-22 MED ORDER — BUPIVACAINE-EPINEPHRINE PF 0.25-1:200000 % IJ SOLN
INTRAMUSCULAR | Status: AC
  Filled 2013-05-22: qty 30

## 2013-05-22 MED ORDER — LACTATED RINGERS IV SOLN
INTRAVENOUS | Status: DC | PRN
  Administered 2013-05-22 (×3): via INTRAVENOUS

## 2013-05-22 MED ORDER — STERILE WATER FOR IRRIGATION IR SOLN
Status: DC | PRN
  Administered 2013-05-22: 3000 mL

## 2013-05-22 MED ORDER — SODIUM CHLORIDE 0.9 % IR SOLN
Status: DC | PRN
  Administered 2013-05-22: 1000 mL

## 2013-05-22 MED ORDER — LABETALOL HCL 5 MG/ML IV SOLN
INTRAVENOUS | Status: AC
  Filled 2013-05-22: qty 4

## 2013-05-22 MED ORDER — ROCURONIUM BROMIDE 100 MG/10ML IV SOLN
INTRAVENOUS | Status: DC | PRN
  Administered 2013-05-22: 20 mg via INTRAVENOUS
  Administered 2013-05-22: 10 mg via INTRAVENOUS
  Administered 2013-05-22: 20 mg via INTRAVENOUS
  Administered 2013-05-22: 50 mg via INTRAVENOUS

## 2013-05-22 MED ORDER — ATORVASTATIN CALCIUM 40 MG PO TABS
40.0000 mg | ORAL_TABLET | Freq: Every day | ORAL | Status: DC
  Filled 2013-05-22 (×2): qty 1

## 2013-05-22 MED ORDER — PROPOFOL 10 MG/ML IV BOLUS
INTRAVENOUS | Status: DC | PRN
  Administered 2013-05-22: 30 mg via INTRAVENOUS
  Administered 2013-05-22: 10 mg via INTRAVENOUS
  Administered 2013-05-22 (×2): 30 mg via INTRAVENOUS
  Administered 2013-05-22: 20 mg via INTRAVENOUS
  Administered 2013-05-22: 30 mg via INTRAVENOUS
  Administered 2013-05-22: 180 mg via INTRAVENOUS
  Administered 2013-05-22: 40 mg via INTRAVENOUS
  Administered 2013-05-22: 30 mg via INTRAVENOUS

## 2013-05-22 SURGICAL SUPPLY — 45 items
CABLE HIGH FREQUENCY MONO STRZ (ELECTRODE) ×2 IMPLANT
CANISTER SUCTION 2500CC (MISCELLANEOUS) ×2 IMPLANT
CATH FOLEY 2WAY SLVR 18FR 30CC (CATHETERS) ×2 IMPLANT
CATH ROBINSON RED A/P 16FR (CATHETERS) ×2 IMPLANT
CATH ROBINSON RED A/P 8FR (CATHETERS) ×2 IMPLANT
CATH TIEMANN FOLEY 18FR 5CC (CATHETERS) ×2 IMPLANT
CHLORAPREP W/TINT 26ML (MISCELLANEOUS) ×2 IMPLANT
CLIP LIGATING HEM O LOK PURPLE (MISCELLANEOUS) ×8 IMPLANT
COVER SURGICAL LIGHT HANDLE (MISCELLANEOUS) ×2 IMPLANT
COVER TIP SHEARS 8 DVNC (MISCELLANEOUS) ×1 IMPLANT
COVER TIP SHEARS 8MM DA VINCI (MISCELLANEOUS) ×1
CUTTER ECHEON FLEX ENDO 45 340 (ENDOMECHANICALS) ×2 IMPLANT
DECANTER SPIKE VIAL GLASS SM (MISCELLANEOUS) IMPLANT
DERMABOND ADVANCED (GAUZE/BANDAGES/DRESSINGS)
DERMABOND ADVANCED .7 DNX12 (GAUZE/BANDAGES/DRESSINGS) IMPLANT
DRAPE INCISE IOBAN 66X45 STRL (DRAPES) ×2 IMPLANT
DRAPE SURG IRRIG POUCH 19X23 (DRAPES) ×2 IMPLANT
DRSG TEGADERM 4X4.75 (GAUZE/BANDAGES/DRESSINGS) ×2 IMPLANT
DRSG TEGADERM 6X8 (GAUZE/BANDAGES/DRESSINGS) ×4 IMPLANT
ELECT REM PT RETURN 9FT ADLT (ELECTROSURGICAL) ×2
ELECTRODE REM PT RTRN 9FT ADLT (ELECTROSURGICAL) ×1 IMPLANT
GLOVE BIO SURGEON STRL SZ 6.5 (GLOVE) ×2 IMPLANT
GLOVE BIOGEL M STRL SZ7.5 (GLOVE) ×4 IMPLANT
GOWN PREVENTION PLUS LG XLONG (DISPOSABLE) ×6 IMPLANT
GOWN STRL REIN XL XLG (GOWN DISPOSABLE) ×2 IMPLANT
HEMOSTAT SURGICEL 4X8 (HEMOSTASIS) ×2 IMPLANT
HOLDER FOLEY CATH W/STRAP (MISCELLANEOUS) ×2 IMPLANT
IV LACTATED RINGERS 1000ML (IV SOLUTION) IMPLANT
KIT ACCESSORY DA VINCI DISP (KITS) ×1
KIT ACCESSORY DVNC DISP (KITS) ×1 IMPLANT
NDL SAFETY ECLIPSE 18X1.5 (NEEDLE) ×1 IMPLANT
NEEDLE HYPO 18GX1.5 SHARP (NEEDLE) ×1
PACK ROBOT UROLOGY CUSTOM (CUSTOM PROCEDURE TRAY) ×2 IMPLANT
RELOAD GREEN ECHELON 45 (STAPLE) ×2 IMPLANT
SET TUBE IRRIG SUCTION NO TIP (IRRIGATION / IRRIGATOR) ×2 IMPLANT
SOLUTION ELECTROLUBE (MISCELLANEOUS) ×2 IMPLANT
SUT ETHILON 3 0 PS 1 (SUTURE) ×2 IMPLANT
SUT MNCRL 3 0 VIOLET RB1 (SUTURE) ×1 IMPLANT
SUT MNCRL AB 4-0 PS2 18 (SUTURE) ×4 IMPLANT
SUT MONOCRYL 3 0 RB1 (SUTURE) ×1
SUT VICRYL 0 UR6 27IN ABS (SUTURE) ×4 IMPLANT
SYR 27GX1/2 1ML LL SAFETY (SYRINGE) ×2 IMPLANT
TOWEL OR 17X26 10 PK STRL BLUE (TOWEL DISPOSABLE) ×2 IMPLANT
TOWEL OR NON WOVEN STRL DISP B (DISPOSABLE) ×2 IMPLANT
WATER STERILE IRR 1500ML POUR (IV SOLUTION) IMPLANT

## 2013-05-22 NOTE — Op Note (Signed)
Preoperative diagnosis: Clinically localized adenocarcinoma of the prostate (clinical stage T1c Nx Mx)  Postoperative diagnosis: Clinically localized adenocarcinoma of the prostate (clinical stage T1c Nx Mx)  Procedure:  1. Robotic assisted laparoscopic radical prostatectomy (bilateral nerve sparing)  Surgeon: Rolly Salter, Montez Hageman. M.D.  Assistant: Dr. Cordella Register  2nd Assitant: Seward Grater, [facility]  Anesthesia: General  Complications: None  EBL: 150 mL  IVF:  1800 mL crystalloid  Specimens: 1. Prostate and seminal vesicles  Disposition of specimens: Pathology  Drains: 1. 20 Fr coude catheter 2. # 19 Blake pelvic drain  Indication: Terry [name] is a 62 y.o. year old patient with clinically localized prostate cancer.  After a thorough review of the management options for treatment of prostate cancer, he elected to proceed with surgical therapy and the above procedure(s).  We have discussed the potential benefits and risks of the procedure, side effects of the proposed treatment, the likelihood of the patient achieving the goals of the procedure, and any potential problems that might occur during the procedure or recuperation. Informed consent has been obtained.  Description of procedure:  The patient was taken to the operating room and a general anesthetic was administered. He was given preoperative antibiotics, placed in the dorsal lithotomy position, and prepped and draped in the usual sterile fashion. Next a preoperative timeout was performed. A urethral catheter was placed into the bladder and a site was selected near the umbilicus for placement of the camera port. This was placed using a standard open Hassan technique which allowed entry into the peritoneal cavity under direct vision and without difficulty. A 12 mm port was placed and a pneumoperitoneum established. The camera was then used to inspect the abdomen and there was no evidence of any intra-abdominal injuries or  other abnormalities. The remaining abdominal ports were then placed. 8 mm robotic ports were placed in the right lower quadrant, left lower quadrant, and far left lateral abdominal wall. A 5 mm port was placed in the right upper quadrant and a 12 mm port was placed in the right lateral abdominal wall for laparoscopic assistance. All ports were placed under direct vision without difficulty. The surgical cart was then docked.   Utilizing the cautery scissors, the bladder was reflected posteriorly allowing entry into the space of Retzius and identification of the endopelvic fascia and prostate. The periprostatic fat was then removed from the prostate allowing full exposure of the endopelvic fascia. The endopelvic fascia was then incised from the apex back to the base of the prostate bilaterally and the underlying levator muscle fibers were swept laterally off the prostate thereby isolating the dorsal venous complex. The dorsal vein was then stapled and divided with a 45 mm Flex Echelon stapler. Attention then turned to the bladder neck which was divided anteriorly thereby allowing entry into the bladder and exposure of the urethral catheter. The catheter balloon was deflated and the catheter was brought into the operative field and used to retract the prostate anteriorly. The posterior bladder neck was then examined and was divided allowing further dissection between the bladder and prostate posteriorly until the vasa deferentia and seminal vessels were identified. The vasa deferentia were isolated, divided, and lifted anteriorly. The seminal vesicles were dissected down to their tips with care to control the seminal vascular arterial blood supply. These structures were then lifted anteriorly and the space between Denonvillier's fascia and the anterior rectum was developed with a combination of sharp and blunt dissection. This isolated the vascular pedicles of  the prostate.  The lateral prostatic fascia was then  sharply incised allowing release of the neurovascular bundles bilaterally. The vascular pedicles of the prostate were then ligated with Weck clips between the prostate and neurovascular bundles and divided with sharp cold scissor dissection resulting in neurovascular bundle preservation. The neurovascular bundles were then separated off the apex of the prostate and urethra bilaterally.  The urethra was then sharply transected allowing the prostate specimen to be disarticulated. The pelvis was copiously irrigated and hemostasis was ensured. There was no evidence for rectal injury.  Attention then turned to the urethral anastomosis. A 2-0 Vicryl slip knot was placed between Denonvillier's fascia, the posterior bladder neck, and the posterior urethra to reapproximate these structures. A double-armed 3-0 Monocryl suture was then used to perform a 360 running tension-free anastomosis between the bladder neck and urethra. A new urethral catheter was then placed into the bladder and irrigated. There were no blood clots within the bladder and the anastomosis appeared to be watertight. A #19 Blake drain was then brought through the left lateral 8 mm port site and positioned appropriately within the pelvis. It was secured to the skin with a nylon suture. The surgical cart was then undocked. The right lateral 12 mm port site was closed at the fascial level with a 0 Vicryl suture placed laparoscopically. All remaining ports were then removed under direct vision. The prostate specimen was removed intact within the Endopouch retrieval bag via the periumbilical camera port site. This fascial opening was closed with two running 0 Vicryl sutures. 0.25% Marcaine was then injected into all port sites and all incisions were reapproximated at the skin level with 4-0 Monocryl subcuticular sutures and Dermabond. The patient appeared to tolerate the procedure well and without complications. The patient was able to be extubated and  transferred to the recovery unit in satisfactory condition.  Moody Bruins MD

## 2013-05-22 NOTE — Anesthesia Postprocedure Evaluation (Signed)
  Anesthesia Post-op Note  Patient: Terry [name]  Procedure(s) Performed: Procedure(s) (LRB): ROBOTIC ASSISTED LAPAROSCOPIC RADICAL PROSTATECTOMY LEVEL 1 (N/A)  Patient Location: PACU  Anesthesia Type: General  Level of Consciousness: awake and alert   Airway and Oxygen Therapy: Patient Spontanous Breathing  Post-op Pain: mild  Post-op Assessment: Post-op Vital signs reviewed, Patient's Cardiovascular Status Stable, Respiratory Function Stable, Patent Airway and No signs of Nausea or vomiting  Last Vitals:  Filed Vitals:   05/22/13 1210  BP: 149/80  Pulse: 72  Temp: 36.8 C  Resp:     Post-op Vital Signs: stable   Complications: No apparent anesthesia complications

## 2013-05-22 NOTE — Progress Notes (Signed)
Patient ID: CANDACE RAMUS, male   DOB: 09-19-1950, 62 y.o.   MRN: [id number]  Post-op note  Subjective: The patient is doing well.  No complaints.  Objective: Vital signs in last 24 hours: Temp:  [98.3 F (36.8 C)-99.3 F (37.4 C)] 99.3 F (37.4 C) (12/22 1825) Pulse Rate:  [72-102] 102 (12/22 1825) Resp:  [6-16] 16 (12/22 1825) BP: (128-161)/(66-91) 161/79 mmHg (12/22 1825) SpO2:  [96 %-99 %] 96 % (12/22 1825) FiO2 (%):  [2 %] 2 % (12/22 1210) Weight:  [80.186 kg (176 lb 12.5 oz)] 80.186 kg (176 lb 12.5 oz) (12/22 1210)  Intake/Output from previous day:   Intake/Output this shift:    Physical Exam:  General: Alert and oriented. Abdomen: Soft, Nondistended. Incisions: Clean and dry.  Lab Results:  Recent Labs  05/22/13 1045  HGB 14.0  HCT 40.9    Assessment/Plan: POD#0   1) Continue to monitor   Moody Bruins. MD   LOS: 0 days   Lovene Maret,LES 05/22/2013, 7:37 PM

## 2013-05-22 NOTE — Transfer of Care (Signed)
Immediate Anesthesia Transfer of Care Note  Patient: Terry [name]  Procedure(s) Performed: Procedure(s) (LRB): ROBOTIC ASSISTED LAPAROSCOPIC RADICAL PROSTATECTOMY LEVEL 1 (N/A)  Patient Location: PACU  Anesthesia Type: General  Level of Consciousness: sedated, patient cooperative and responds to stimulation  Airway & Oxygen Therapy: Patient Spontanous Breathing and Patient connected to face mask oxgen  Post-op Assessment: Report given to PACU RN and Post -op Vital signs reviewed and stable  Post vital signs: Reviewed and stable  Complications: No apparent anesthesia complications

## 2013-05-22 NOTE — Interval H&P Note (Signed)
History and Physical Interval Note:  05/22/2013 7:07 AM  Terry [name]  has presented today for surgery, with the diagnosis of PROSTATE CANCER  The various methods of treatment have been discussed with the patient and family. After consideration of risks, benefits and other options for treatment, the patient has consented to  Procedure(s): ROBOTIC ASSISTED LAPAROSCOPIC RADICAL PROSTATECTOMY LEVEL 1 (N/A) as a surgical intervention .  The patient's history has been reviewed, patient examined, no change in status, stable for surgery.  I have reviewed the patient's chart and labs.  Questions were answered to the patient's satisfaction.     Corben Auzenne,LES

## 2013-05-23 ENCOUNTER — Encounter (HOSPITAL_COMMUNITY): Payer: Self-pay | Admitting: Urology

## 2013-05-23 LAB — HEMOGLOBIN AND HEMATOCRIT, BLOOD: HCT: 35.9 % — ABNORMAL LOW (ref 39.0–52.0)

## 2013-05-23 MED ORDER — BISACODYL 10 MG RE SUPP
10.0000 mg | Freq: Once | RECTAL | Status: AC
  Administered 2013-05-23: 09:00:00 10 mg via RECTAL
  Filled 2013-05-23: qty 1

## 2013-05-23 MED ORDER — HYDROCODONE-ACETAMINOPHEN 5-325 MG PO TABS
1.0000 | ORAL_TABLET | Freq: Four times a day (QID) | ORAL | Status: DC | PRN
  Administered 2013-05-23: 2 via ORAL
  Filled 2013-05-23: qty 2

## 2013-05-23 NOTE — Discharge Summary (Signed)
  Date of admission: 05/22/2013  Date of discharge: 05/23/2013  Admission diagnosis: Prostate Cancer  Discharge diagnosis: Prostate Cancer  History and Physical: For full details, please see admission history and physical. Briefly, GERHARDT [name] is a 62 y.o. gentleman with localized prostate cancer.  After discussing management/treatment options, he elected to proceed with surgical treatment.  Hospital Course: [name] was taken to the operating room on 05/22/2013 and underwent a robotic assisted laparoscopic radical prostatectomy. He tolerated this procedure well and without complications. Postoperatively, he was able to be transferred to a regular hospital room following recovery from anesthesia.  He was able to begin ambulating the night of surgery. He remained hemodynamically stable overnight.  He had excellent urine output with appropriately minimal output from his pelvic drain and his pelvic drain was removed on POD #1.  He was transitioned to oral pain medication, tolerated a clear liquid diet, and had met all discharge criteria and was able to be discharged home later on POD#1.  Laboratory values:  Recent Labs  05/22/13 1045 05/23/13 0514  HGB 14.0 12.4*  HCT 40.9 35.9*    Disposition: Home  Discharge instruction: He was instructed to be ambulatory but to refrain from heavy lifting, strenuous activity, or driving. He was instructed on urethral catheter care.  Discharge medications:     Medication List    STOP taking these medications       CENTRUM SILVER ADULT 50+ PO     Krill Oil 1000 MG Caps     naproxen sodium 220 MG tablet  Commonly known as:  ANAPROX      TAKE these medications       cetirizine 10 MG tablet  Commonly known as:  ZYRTEC  Take 10 mg by mouth daily. PRN     ciprofloxacin 500 MG tablet  Commonly known as:  CIPRO  Take 1 tablet (500 mg total) by mouth 2 (two) times daily. Begin one day prior to catheter removal.     cycloSPORINE 0.05 %  ophthalmic emulsion  Commonly known as:  RESTASIS  Place 1 drop into both eyes 2 (two) times daily.     diphenhydrAMINE 25 MG tablet  Commonly known as:  BENADRYL  Take 25 mg by mouth at bedtime as needed.     docusate sodium 100 MG capsule  Commonly known as:  COLACE  Take 1 capsule (100 mg total) by mouth 2 (two) times daily.     HYDROcodone-acetaminophen 5-325 MG per tablet  Commonly known as:  NORCO/VICODIN  Take 1-2 tablets by mouth every 6 (six) hours as needed.     mometasone 50 MCG/ACT nasal spray  Commonly known as:  NASONEX  Place 2 sprays into the nose daily.     omeprazole 20 MG capsule  Commonly known as:  PRILOSEC  Take 20 mg by mouth daily.     rosuvastatin 20 MG tablet  Commonly known as:  CRESTOR  Take 20 mg by mouth every evening.        Followup: He will followup in 1 week for catheter removal and to discuss his surgical pathology results.

## 2013-05-23 NOTE — Progress Notes (Signed)
Patient ID: Terry [name], male   DOB: 12-Dec-1950, 62 y.o.   MRN: [id number]  1 Day Post-Op Subjective: The patient is doing well.  No nausea or vomiting. Pain is adequately controlled.  Objective: Vital signs in last 24 hours: Temp:  [97.6 F (36.4 C)-99.3 F (37.4 C)] 98.1 F (36.7 C) (12/23 0200) Pulse Rate:  [71-102] 71 (12/23 0200) Resp:  [6-16] 16 (12/23 0200) BP: (117-161)/(66-84) 117/70 mmHg (12/23 0200) SpO2:  [95 %-99 %] 97 % (12/23 0200) FiO2 (%):  [2 %] 2 % (12/22 1210) Weight:  [80.186 kg (176 lb 12.5 oz)] 80.186 kg (176 lb 12.5 oz) (12/22 1210)  Intake/Output from previous day: 12/22 0701 - 12/23 0700 In: 6455 [P.O.:360; I.V.:5095; IV Piggyback:1000] Out: 4280 [Urine:4030; Drains:100; Blood:150] Intake/Output this shift:    Physical Exam:  General: Alert and oriented. CV: RRR Lungs: Clear bilaterally. GI: Soft, Nondistended. Positive BS. Incisions: Dressings intact. Urine: Clear Extremities: Nontender, no erythema, no edema.  Lab Results:  Recent Labs  05/22/13 1045 05/23/13 0514  HGB 14.0 12.4*  HCT 40.9 35.9*      Assessment/Plan: POD# 1 s/p robotic prostatectomy.  1) SL IVF 2) Ambulate, Incentive spirometry 3) Transition to oral pain medication 4) Dulcolax suppository 5) D/C pelvic drain 6) Plan for likely discharge later today   Moody Bruins. MD   LOS: 1 day   Dafna Romo,LES 05/23/2013, 7:34 AM

## 2013-05-23 NOTE — Progress Notes (Signed)
Leg bag teaching done with patient and wife. Both demonstrate and verbalize understanding. Setzer, Don Broach

## 2014-06-21 NOTE — Pre-Procedure Instructions (Signed)
JACAI KIPP  06/21/2014   Your procedure is scheduled on:  Wednesday,January 27.  Report to [facility] Admitting at 10:30 AM.  Call this number if you have problems the morning of surgery: [phone]   Remember:   Do not eat food or drink liquids after midnight Tuesday, January 26.   Take these medicines the morning of surgery with A SIP OF WATER: omeprazole (PRILOSEC).              May take if need  cetirizine (ZYRTEC) and HYDROcodone-acetaminophen (NORCO/VICODIN).   Do not wear jewelry, make-up or nail polish.  Do not wear lotions, powders, or perfumes.  Men may shave face and neck.  Do not bring valuables to the hospital.               [facility] is not responsible for any belongings or valuables.               Contacts, dentures or bridgework may not be worn into surgery.  Leave suitcase in the car. After surgery it may be brought to your room.  For patients admitted to the hospital, discharge time is determined by your treatment team.               Patients discharged the day of surgery will not be allowed to drive home.  Name and phone number of your driver: -   Special Instructions: -              Bring brace with you.   Please read over the following fact sheets that you were given: Pain Booklet, Coughing and Deep Breathing and Surgical Site Infection Prevention

## 2014-06-21 NOTE — Pre-Procedure Instructions (Signed)
Terry [name]  06/21/2014   Your procedure is scheduled on:  Wednesday,January 27.  Report to [facility] Admitting at 10:30 AM.  Call this number if you have problems the morning of surgery: [phone]   Remember:   Do not eat food or drink liquids after midnight Tuesday, January 26.   Take these medicines the morning of surgery with A SIP OF WATER: omeprazole (PRILOSEC).              May take if need  cetirizine (ZYRTEC) and HYDROcodone-acetaminophen (NORCO/VICODIN).   Do not wear jewelry, make-up or nail polish.  Do not wear lotions, powders, or perfumes.  Men may shave face and neck.  Do not bring valuables to the hospital.               [facility] is not responsible for any belongings or valuables.               Contacts, dentures or bridgework may not be worn into surgery.  Leave suitcase in the car. After surgery it may be brought to your room.  For patients admitted to the hospital, discharge time is determined by your treatment team.               Patients discharged the day of surgery will not be allowed to drive home.  Name and phone number of your driver: -   Special Instructions: -   Please read over the following fact sheets that you were given: Pain Booklet, Coughing and Deep Breathing and Surgical Site Infection Prevention

## 2014-06-21 NOTE — Pre-Procedure Instructions (Signed)
Terry [name]  06/21/2014   Your procedure is scheduled on:  Wednesday,January 27.  Report to [facility] Admitting at 10:30 AM.  Call this number if you have problems the morning of surgery: [phone]   Remember:   Do not eat food or drink liquids after midnight Tuesday, January 26.   Take these medicines the morning of surgery with A SIP OF WATER: omeprazole (PRILOSEC).   Do not wear jewelry, make-up or nail polish.  Do not wear lotions, powders, or perfumes.  Men may shave face and neck.  Do not bring valuables to the hospital.               [facility] is not responsible for any belongings or valuables.               Contacts, dentures or bridgework may not be worn into surgery.  Leave suitcase in the car. After surgery it may be brought to your room.  For patients admitted to the hospital, discharge time is determined by your treatment team.               Patients discharged the day of surgery will not be allowed to drive home.  Name and phone number of your driver: -   Special Instructions: -   Please read over the following fact sheets that you were given: Pain Booklet, Coughing and Deep Breathing and Surgical Site Infection Prevention

## 2014-06-22 ENCOUNTER — Other Ambulatory Visit (HOSPITAL_COMMUNITY): Payer: Self-pay

## 2014-06-22 ENCOUNTER — Encounter (HOSPITAL_COMMUNITY): Payer: Self-pay

## 2014-06-22 ENCOUNTER — Encounter (HOSPITAL_COMMUNITY)
Admission: RE | Admit: 2014-06-22 | Discharge: 2014-06-22 | Disposition: A | Discharge status: Home / Self Care | Payer: 59 | Source: Ambulatory Visit | Attending: Orthopedic Surgery | Admitting: Orthopedic Surgery

## 2014-06-22 DIAGNOSIS — Z01818 Encounter for other preprocedural examination: Secondary | ICD-10-CM | POA: Diagnosis present

## 2014-06-22 DIAGNOSIS — M47892 Other spondylosis, cervical region: Secondary | ICD-10-CM | POA: Insufficient documentation

## 2014-06-22 HISTORY — DX: Pneumonia, unspecified organism: J18.9

## 2014-06-22 LAB — BASIC METABOLIC PANEL
ANION GAP: 7 (ref 5–15)
BUN: 13 mg/dL (ref 6–23)
CO2: 27 mmol/L (ref 19–32)
CREATININE: 1.06 mg/dL (ref 0.50–1.35)
Calcium: 9.7 mg/dL (ref 8.4–10.5)
Chloride: 105 mmol/L (ref 96–112)
GFR calc Af Amer: 83 mL/min — ABNORMAL LOW (ref >90)
GFR, EST NON AFRICAN AMERICAN: 72 mL/min — AB (ref >90)
Glucose, Bld: 117 mg/dL — ABNORMAL HIGH (ref 70–99)
Potassium: 3.9 mmol/L (ref 3.5–5.1)
Sodium: 139 mmol/L (ref 135–145)

## 2014-06-22 LAB — CBC
HCT: 41.9 % (ref 39.0–52.0)
Hemoglobin: 14.9 g/dL (ref 13.0–17.0)
MCH: 29.7 pg (ref 26.0–34.0)
MCHC: 35.6 g/dL (ref 30.0–36.0)
MCV: 83.6 fL (ref 78.0–100.0)
PLATELETS: 203 10*3/uL (ref 150–400)
RBC: 5.01 MIL/uL (ref 4.22–5.81)
RDW: 13.4 % (ref 11.5–15.5)
WBC: 6.9 10*3/uL (ref 4.0–10.5)

## 2014-06-22 LAB — SURGICAL PCR SCREEN
MRSA, PCR: NEGATIVE
Staphylococcus aureus: POSITIVE — AB

## 2014-06-22 NOTE — Progress Notes (Signed)
Mupirocin Ointment Rx called into [facility] on [address] for positive PCR of staph. Pt notified and voiced understanding.

## 2014-06-22 NOTE — Pre-Procedure Instructions (Signed)
DAMEN WINDSOR  06/22/2014   Your procedure is scheduled on:  Wednesday, June 27, 2014 at 12:30 PM.   Report to [facility] Entrance "A" Admitting Office at 10:30 AM.   Call this number if you have problems the morning of surgery: [phone]   Remember:   Do not eat food or drink liquids after midnight Tuesday, 06/26/14.   Take these medicines the morning of surgery with A SIP OF WATER: Prilosec (Omeprazole), Nasonex, Robaxin (Methocarbamol) - if needed, Restasis eye drops if needed   Do not wear jewelry.  Do not wear lotions, powders, or cologne. You may wear deodorant.  Men may shave face and neck.  Do not bring valuables to the hospital.  [facility] is not responsible                  for any belongings or valuables.               Contacts, dentures or bridgework may not be worn into surgery.  Leave suitcase in the car. After surgery it may be brought to your room.  For patients admitted to the hospital, discharge time is determined by your                treatment team.               Special Instructions: [place] - Preparing for Surgery  Before surgery, you can play an important role.  Because skin is not sterile, your skin needs to be as free of germs as possible.  You can reduce the number of germs on you skin by washing with CHG (chlorahexidine gluconate) soap before surgery.  CHG is an antiseptic cleaner which kills germs and bonds with the skin to continue killing germs even after washing.  Please DO NOT use if you have an allergy to CHG or antibacterial soaps.  If your skin becomes reddened/irritated stop using the CHG and inform your nurse when you arrive at Short Stay.  Do not shave (including legs and underarms) for at least 48 hours prior to the first CHG shower.  You may shave your face.  Please follow these instructions carefully:   1.  Shower with CHG Soap the night before surgery and the                                morning of Surgery.  2.  If  you choose to wash your hair, wash your hair first as usual with your       normal shampoo.  3.  After you shampoo, rinse your hair and body thoroughly to remove the                      Shampoo.  4.  Use CHG as you would any other liquid soap.  You can apply chg directly       to the skin and wash gently with scrungie or a clean washcloth.  5.  Apply the CHG Soap to your body ONLY FROM THE NECK DOWN.        Do not use on open wounds or open sores.  Avoid contact with your eyes, ears, mouth and genitals (private parts).  Wash genitals (private parts) with your normal soap.  6.  Wash thoroughly, paying special attention to the area where your surgery        will be performed.  7.  Thoroughly rinse your body with warm water from the neck down.  8.  DO NOT shower/wash with your normal soap after using and rinsing off       the CHG Soap.  9.  Pat yourself dry with a clean towel.            10.  Wear clean pajamas.            11.  Place clean sheets on your bed the night of your first shower and do not        sleep with pets.  Day of Surgery  Do not apply any lotions the morning of surgery.  Please wear clean clothes to the hospital.     Please read over the following fact sheets that you were given: Pain Booklet, Coughing and Deep Breathing, MRSA Information and Surgical Site Infection Prevention

## 2014-06-26 MED ORDER — ACETAMINOPHEN 10 MG/ML IV SOLN: 1000.0000 mg | Freq: Four times a day (QID) | INTRAVENOUS | Status: DC

## 2014-06-26 MED ORDER — DEXAMETHASONE SODIUM PHOSPHATE 4 MG/ML IJ SOLN
10.0000 mg | Freq: Once | INTRAMUSCULAR | Status: DC
  Filled 2014-06-26: qty 3

## 2014-06-26 MED ORDER — VANCOMYCIN HCL IN DEXTROSE 1-5 GM/200ML-% IV SOLN
1000.0000 mg | INTRAVENOUS | Status: AC
  Administered 2014-06-27: 1000 mg via INTRAVENOUS
  Filled 2014-06-26: qty 200

## 2014-06-27 ENCOUNTER — Encounter (HOSPITAL_COMMUNITY): Payer: Self-pay | Admitting: *Deleted

## 2014-06-27 ENCOUNTER — Ambulatory Visit (HOSPITAL_COMMUNITY): Payer: 59 | Admitting: Certified Registered Nurse Anesthetist

## 2014-06-27 ENCOUNTER — Observation Stay (HOSPITAL_COMMUNITY): Payer: 59

## 2014-06-27 ENCOUNTER — Encounter (HOSPITAL_COMMUNITY)
Admission: RE | Disposition: A | Discharge status: Home / Self Care | Payer: Self-pay | Source: Ambulatory Visit | Attending: Orthopedic Surgery

## 2014-06-27 ENCOUNTER — Ambulatory Visit (HOSPITAL_COMMUNITY): Payer: 59

## 2014-06-27 ENCOUNTER — Observation Stay (HOSPITAL_COMMUNITY)
Admission: RE | Admit: 2014-06-27 | Discharge: 2014-06-28 | Disposition: A | Discharge status: Home / Self Care | Payer: 59 | Source: Ambulatory Visit | Attending: Orthopedic Surgery | Admitting: Orthopedic Surgery

## 2014-06-27 DIAGNOSIS — Z880 Allergy status to penicillin: Secondary | ICD-10-CM | POA: Insufficient documentation

## 2014-06-27 DIAGNOSIS — E780 Pure hypercholesterolemia: Secondary | ICD-10-CM | POA: Insufficient documentation

## 2014-06-27 DIAGNOSIS — K219 Gastro-esophageal reflux disease without esophagitis: Secondary | ICD-10-CM | POA: Insufficient documentation

## 2014-06-27 DIAGNOSIS — M47812 Spondylosis without myelopathy or radiculopathy, cervical region: Secondary | ICD-10-CM

## 2014-06-27 DIAGNOSIS — M542 Cervicalgia: Secondary | ICD-10-CM | POA: Diagnosis present

## 2014-06-27 DIAGNOSIS — Z8546 Personal history of malignant neoplasm of prostate: Secondary | ICD-10-CM | POA: Insufficient documentation

## 2014-06-27 DIAGNOSIS — J45909 Unspecified asthma, uncomplicated: Secondary | ICD-10-CM | POA: Insufficient documentation

## 2014-06-27 DIAGNOSIS — Z981 Arthrodesis status: Secondary | ICD-10-CM

## 2014-06-27 DIAGNOSIS — M2578 Osteophyte, vertebrae: Secondary | ICD-10-CM | POA: Diagnosis not present

## 2014-06-27 DIAGNOSIS — M4722 Other spondylosis with radiculopathy, cervical region: Principal | ICD-10-CM | POA: Insufficient documentation

## 2014-06-27 DIAGNOSIS — M1990 Unspecified osteoarthritis, unspecified site: Secondary | ICD-10-CM | POA: Insufficient documentation

## 2014-06-27 HISTORY — PX: ANTERIOR CERVICAL DECOMP/DISCECTOMY FUSION: SHX1161

## 2014-06-27 SURGERY — ANTERIOR CERVICAL DECOMPRESSION/DISCECTOMY FUSION 2 LEVELS
Anesthesia: General

## 2014-06-27 MED ORDER — ACETAMINOPHEN 10 MG/ML IV SOLN
1000.0000 mg | Freq: Four times a day (QID) | INTRAVENOUS | Status: DC
  Administered 2014-06-27 – 2014-06-28 (×2): 1000 mg via INTRAVENOUS
  Filled 2014-06-27 (×4): qty 100

## 2014-06-27 MED ORDER — ACETAMINOPHEN 10 MG/ML IV SOLN
INTRAVENOUS | Status: DC | PRN
  Administered 2014-06-27: 1000 mg via INTRAVENOUS

## 2014-06-27 MED ORDER — FENTANYL CITRATE 0.05 MG/ML IJ SOLN
INTRAMUSCULAR | Status: AC
  Filled 2014-06-27: qty 5

## 2014-06-27 MED ORDER — GLYCOPYRROLATE 0.2 MG/ML IJ SOLN
INTRAMUSCULAR | Status: DC | PRN
  Administered 2014-06-27: .8 mg via INTRAVENOUS

## 2014-06-27 MED ORDER — LACTATED RINGERS IV SOLN
INTRAVENOUS | Status: DC
  Administered 2014-06-27: 11:00:00 via INTRAVENOUS

## 2014-06-27 MED ORDER — OXYCODONE HCL 5 MG PO TABS
5.0000 mg | ORAL_TABLET | Freq: Once | ORAL | Status: DC | PRN
Start: 2014-06-27 — End: 2014-06-27

## 2014-06-27 MED ORDER — GLYCOPYRROLATE 0.2 MG/ML IJ SOLN
INTRAMUSCULAR | Status: AC
  Filled 2014-06-27: qty 4

## 2014-06-27 MED ORDER — LACTATED RINGERS IV SOLN
INTRAVENOUS | Status: DC | PRN
  Administered 2014-06-27 (×2): via INTRAVENOUS

## 2014-06-27 MED ORDER — PROMETHAZINE HCL 25 MG/ML IJ SOLN: 6.2500 mg | INTRAMUSCULAR | Status: DC | PRN

## 2014-06-27 MED ORDER — THROMBIN 20000 UNITS EX SOLR
CUTANEOUS | Status: DC | PRN
  Administered 2014-06-27: 20000 mL via TOPICAL

## 2014-06-27 MED ORDER — OXYCODONE-ACETAMINOPHEN 10-325 MG PO TABS: 1.0000 | ORAL_TABLET | ORAL | Status: DC | PRN

## 2014-06-27 MED ORDER — DEXAMETHASONE SODIUM PHOSPHATE 4 MG/ML IJ SOLN
INTRAMUSCULAR | Status: DC | PRN
  Administered 2014-06-27: 10 mg via INTRAVENOUS

## 2014-06-27 MED ORDER — VANCOMYCIN HCL IN DEXTROSE 1-5 GM/200ML-% IV SOLN
1000.0000 mg | Freq: Once | INTRAVENOUS | Status: AC
  Administered 2014-06-27: 1000 mg via INTRAVENOUS
  Filled 2014-06-27: qty 200

## 2014-06-27 MED ORDER — PROPOFOL 10 MG/ML IV BOLUS
INTRAVENOUS | Status: DC | PRN
  Administered 2014-06-27: 150 mg via INTRAVENOUS

## 2014-06-27 MED ORDER — DOCUSATE SODIUM 100 MG PO CAPS: 100.0000 mg | ORAL_CAPSULE | Freq: Three times a day (TID) | ORAL | Status: DC | PRN

## 2014-06-27 MED ORDER — ARTIFICIAL TEARS OP OINT
TOPICAL_OINTMENT | OPHTHALMIC | Status: DC | PRN
  Administered 2014-06-27: 1 via OPHTHALMIC

## 2014-06-27 MED ORDER — VECURONIUM BROMIDE 10 MG IV SOLR
INTRAVENOUS | Status: AC
  Filled 2014-06-27: qty 20

## 2014-06-27 MED ORDER — SODIUM CHLORIDE 0.9 % IJ SOLN: 3.0000 mL | Freq: Two times a day (BID) | INTRAMUSCULAR | Status: DC

## 2014-06-27 MED ORDER — METHOCARBAMOL 500 MG PO TABS
500.0000 mg | ORAL_TABLET | Freq: Four times a day (QID) | ORAL | Status: DC | PRN
  Administered 2014-06-27: 500 mg via ORAL
  Filled 2014-06-27 (×2): qty 1

## 2014-06-27 MED ORDER — LACTATED RINGERS IV SOLN: INTRAVENOUS | Status: DC

## 2014-06-27 MED ORDER — PHENYLEPHRINE HCL 10 MG/ML IJ SOLN
10.0000 mg | INTRAVENOUS | Status: DC | PRN
  Administered 2014-06-27: 20 ug/min via INTRAVENOUS

## 2014-06-27 MED ORDER — HYDROMORPHONE HCL 1 MG/ML IJ SOLN
0.2500 mg | INTRAMUSCULAR | Status: DC | PRN
  Administered 2014-06-27 (×4): 0.5 mg via INTRAVENOUS

## 2014-06-27 MED ORDER — ROCURONIUM BROMIDE 100 MG/10ML IV SOLN
INTRAVENOUS | Status: DC | PRN
  Administered 2014-06-27: 50 mg via INTRAVENOUS

## 2014-06-27 MED ORDER — ROCURONIUM BROMIDE 50 MG/5ML IV SOLN
INTRAVENOUS | Status: AC
  Filled 2014-06-27: qty 1

## 2014-06-27 MED ORDER — DEXAMETHASONE 4 MG PO TABS
4.0000 mg | ORAL_TABLET | Freq: Four times a day (QID) | ORAL | Status: DC
  Administered 2014-06-27 – 2014-06-28 (×2): 4 mg via ORAL
  Filled 2014-06-27 (×6): qty 1

## 2014-06-27 MED ORDER — HYDROMORPHONE HCL 1 MG/ML IJ SOLN
INTRAMUSCULAR | Status: AC
  Administered 2014-06-27: 0.5 mg via INTRAVENOUS
  Filled 2014-06-27: qty 1

## 2014-06-27 MED ORDER — OXYCODONE HCL 5 MG/5ML PO SOLN: 5.0000 mg | Freq: Once | ORAL | Status: DC | PRN

## 2014-06-27 MED ORDER — DEXTROSE 5 % IV SOLN
500.0000 mg | Freq: Four times a day (QID) | INTRAVENOUS | Status: DC | PRN
  Administered 2014-06-27: 500 mg via INTRAVENOUS
  Filled 2014-06-27 (×3): qty 5

## 2014-06-27 MED ORDER — MIDAZOLAM HCL 5 MG/5ML IJ SOLN
INTRAMUSCULAR | Status: DC | PRN
  Administered 2014-06-27: 2 mg via INTRAVENOUS

## 2014-06-27 MED ORDER — BUPIVACAINE-EPINEPHRINE 0.25% -1:200000 IJ SOLN
INTRAMUSCULAR | Status: DC | PRN
  Administered 2014-06-27: 7 mL

## 2014-06-27 MED ORDER — SODIUM CHLORIDE 0.9 % IJ SOLN: 3.0000 mL | INTRAMUSCULAR | Status: DC | PRN

## 2014-06-27 MED ORDER — LIDOCAINE HCL (CARDIAC) 20 MG/ML IV SOLN
INTRAVENOUS | Status: DC | PRN
  Administered 2014-06-27: 60 mg via INTRAVENOUS

## 2014-06-27 MED ORDER — SODIUM CHLORIDE 0.9 % IV SOLN: 250.0000 mL | INTRAVENOUS | Status: DC

## 2014-06-27 MED ORDER — MORPHINE SULFATE 2 MG/ML IJ SOLN: 1.0000 mg | INTRAMUSCULAR | Status: DC | PRN

## 2014-06-27 MED ORDER — ONDANSETRON HCL 4 MG/2ML IJ SOLN
INTRAMUSCULAR | Status: AC
  Filled 2014-06-27: qty 2

## 2014-06-27 MED ORDER — THROMBIN 20000 UNITS EX SOLR
CUTANEOUS | Status: AC
  Filled 2014-06-27: qty 20000

## 2014-06-27 MED ORDER — MENTHOL 3 MG MT LOZG: 1.0000 | LOZENGE | OROMUCOSAL | Status: DC | PRN

## 2014-06-27 MED ORDER — PHENOL 1.4 % MT LIQD
1.0000 | OROMUCOSAL | Status: DC | PRN
Start: 2014-06-27 — End: 2014-06-28

## 2014-06-27 MED ORDER — MIDAZOLAM HCL 2 MG/2ML IJ SOLN
INTRAMUSCULAR | Status: AC
  Filled 2014-06-27: qty 2

## 2014-06-27 MED ORDER — METHOCARBAMOL 500 MG PO TABS: 500.0000 mg | ORAL_TABLET | Freq: Three times a day (TID) | ORAL | Status: DC | PRN

## 2014-06-27 MED ORDER — 0.9 % SODIUM CHLORIDE (POUR BTL) OPTIME
TOPICAL | Status: DC | PRN
  Administered 2014-06-27: 1000 mL

## 2014-06-27 MED ORDER — DEXAMETHASONE SODIUM PHOSPHATE 4 MG/ML IJ SOLN
4.0000 mg | Freq: Four times a day (QID) | INTRAMUSCULAR | Status: DC
  Filled 2014-06-27 (×4): qty 1

## 2014-06-27 MED ORDER — FENTANYL CITRATE 0.05 MG/ML IJ SOLN
INTRAMUSCULAR | Status: DC | PRN
  Administered 2014-06-27: 100 ug via INTRAVENOUS
  Administered 2014-06-27 (×2): 50 ug via INTRAVENOUS
  Administered 2014-06-27: 100 ug via INTRAVENOUS

## 2014-06-27 MED ORDER — PHENYLEPHRINE HCL 10 MG/ML IJ SOLN
INTRAMUSCULAR | Status: DC | PRN
  Administered 2014-06-27 (×4): 80 ug via INTRAVENOUS

## 2014-06-27 MED ORDER — NEOSTIGMINE METHYLSULFATE 10 MG/10ML IV SOLN
INTRAVENOUS | Status: DC | PRN
  Administered 2014-06-27: 4 mg via INTRAVENOUS

## 2014-06-27 MED ORDER — ONDANSETRON HCL 4 MG PO TABS: 4.0000 mg | ORAL_TABLET | Freq: Three times a day (TID) | ORAL | Status: DC | PRN

## 2014-06-27 MED ORDER — ACETAMINOPHEN 10 MG/ML IV SOLN
INTRAVENOUS | Status: AC
  Filled 2014-06-27: qty 100

## 2014-06-27 MED ORDER — PROPOFOL 10 MG/ML IV BOLUS
INTRAVENOUS | Status: AC
  Filled 2014-06-27: qty 20

## 2014-06-27 MED ORDER — ONDANSETRON HCL 4 MG/2ML IJ SOLN: 4.0000 mg | INTRAMUSCULAR | Status: DC | PRN

## 2014-06-27 MED ORDER — BUPIVACAINE-EPINEPHRINE (PF) 0.25% -1:200000 IJ SOLN
INTRAMUSCULAR | Status: AC
  Filled 2014-06-27: qty 30

## 2014-06-27 MED ORDER — LIDOCAINE HCL (CARDIAC) 20 MG/ML IV SOLN
INTRAVENOUS | Status: AC
  Filled 2014-06-27: qty 5

## 2014-06-27 MED ORDER — DEXAMETHASONE SODIUM PHOSPHATE 10 MG/ML IJ SOLN
INTRAMUSCULAR | Status: AC
  Filled 2014-06-27: qty 1

## 2014-06-27 MED ORDER — ONDANSETRON HCL 4 MG/2ML IJ SOLN
INTRAMUSCULAR | Status: DC | PRN
  Administered 2014-06-27: 4 mg via INTRAVENOUS

## 2014-06-27 MED ORDER — OXYCODONE HCL 5 MG PO TABS
10.0000 mg | ORAL_TABLET | ORAL | Status: DC | PRN
  Administered 2014-06-27: 10 mg via ORAL
  Filled 2014-06-27: qty 2

## 2014-06-27 SURGICAL SUPPLY — 71 items
BENZOIN TINCTURE PRP APPL 2/3 (GAUZE/BANDAGES/DRESSINGS) ×3 IMPLANT
BLADE SURG ROTATE 9660 (MISCELLANEOUS) IMPLANT
BUR EGG ELITE 4.0 (BURR) IMPLANT
BUR EGG ELITE 4.0MM (BURR)
BUR MATCHSTICK NEURO 3.0 LAGG (BURR) IMPLANT
CANISTER SUCTION 2500CC (MISCELLANEOUS) ×3 IMPLANT
CLOSURE STERI-STRIP 1/2X4 (GAUZE/BANDAGES/DRESSINGS) ×1
CLSR STERI-STRIP ANTIMIC 1/2X4 (GAUZE/BANDAGES/DRESSINGS) ×2 IMPLANT
CORDS BIPOLAR (ELECTRODE) ×3 IMPLANT
COVER SURGICAL LIGHT HANDLE (MISCELLANEOUS) ×6 IMPLANT
CRADLE DONUT ADULT HEAD (MISCELLANEOUS) ×3 IMPLANT
DEVICE ENDSKLTN MED 6 7MM (Orthopedic Implant) ×2 IMPLANT
DRAPE C-ARM 42X72 X-RAY (DRAPES) ×3 IMPLANT
DRAPE POUCH INSTRU U-SHP 10X18 (DRAPES) ×3 IMPLANT
DRAPE SURG 17X23 STRL (DRAPES) ×3 IMPLANT
DRAPE U-SHAPE 47X51 STRL (DRAPES) ×3 IMPLANT
DRSG MEPILEX BORDER 4X4 (GAUZE/BANDAGES/DRESSINGS) ×3 IMPLANT
DURAPREP 26ML APPLICATOR (WOUND CARE) ×3 IMPLANT
ELECT COATED BLADE 2.86 ST (ELECTRODE) ×3 IMPLANT
ELECT PENCIL ROCKER SW 15FT (MISCELLANEOUS) ×3 IMPLANT
ELECT REM PT RETURN 9FT ADLT (ELECTROSURGICAL) ×3
ELECTRODE REM PT RTRN 9FT ADLT (ELECTROSURGICAL) ×1 IMPLANT
ENDOSKELETON MED 6 7MM (Orthopedic Implant) ×6 IMPLANT
GLOVE BIOGEL PI IND STRL 6 (GLOVE) ×1 IMPLANT
GLOVE BIOGEL PI IND STRL 6.5 (GLOVE) ×2 IMPLANT
GLOVE BIOGEL PI IND STRL 8 (GLOVE) IMPLANT
GLOVE BIOGEL PI IND STRL 8.5 (GLOVE) ×1 IMPLANT
GLOVE BIOGEL PI INDICATOR 6 (GLOVE) ×2
GLOVE BIOGEL PI INDICATOR 6.5 (GLOVE) ×4
GLOVE BIOGEL PI INDICATOR 8 (GLOVE)
GLOVE BIOGEL PI INDICATOR 8.5 (GLOVE) ×2
GLOVE ECLIPSE 8.5 STRL (GLOVE) ×3 IMPLANT
GLOVE ORTHO TXT STRL SZ7.5 (GLOVE) IMPLANT
GOWN STRL REUS W/ TWL LRG LVL3 (GOWN DISPOSABLE) ×1 IMPLANT
GOWN STRL REUS W/ TWL XL LVL3 (GOWN DISPOSABLE) IMPLANT
GOWN STRL REUS W/TWL 2XL LVL3 (GOWN DISPOSABLE) ×3 IMPLANT
GOWN STRL REUS W/TWL LRG LVL3 (GOWN DISPOSABLE) ×2
GOWN STRL REUS W/TWL XL LVL3 (GOWN DISPOSABLE)
KIT BASIN OR (CUSTOM PROCEDURE TRAY) ×3 IMPLANT
KIT ROOM TURNOVER OR (KITS) ×3 IMPLANT
NEEDLE SPNL 18GX3.5 QUINCKE PK (NEEDLE) ×3 IMPLANT
NS IRRIG 1000ML POUR BTL (IV SOLUTION) ×3 IMPLANT
PACK ORTHO CERVICAL (CUSTOM PROCEDURE TRAY) ×3 IMPLANT
PACK UNIVERSAL I (CUSTOM PROCEDURE TRAY) ×3 IMPLANT
PAD ARMBOARD 7.5X6 YLW CONV (MISCELLANEOUS) ×6 IMPLANT
PATTIES SURGICAL .25X.25 (GAUZE/BANDAGES/DRESSINGS) ×3 IMPLANT
PIN DISTRACTION 14 (PIN) ×3 IMPLANT
PIN RETAINER PRODISC 14 MM (PIN) ×3 IMPLANT
PIN TEMP SKYLINE THREADED (PIN) ×3 IMPLANT
PLATE SKYLINE 2 LEVEL 34MM (Plate) ×3 IMPLANT
PUTTY BONE DBX 2.5 MIS (Bone Implant) ×3 IMPLANT
RESTRAINT LIMB HOLDER UNIV (RESTRAINTS) ×3 IMPLANT
SCREW SKYLINE 14MM SD-VA (Screw) ×12 IMPLANT
SCREW SKYLINE 16MM (Screw) ×6 IMPLANT
SPONGE INTESTINAL PEANUT (DISPOSABLE) ×3 IMPLANT
SPONGE LAP 4X18 X RAY DECT (DISPOSABLE) ×6 IMPLANT
SPONGE SURGIFOAM ABS GEL 100 (HEMOSTASIS) ×3 IMPLANT
SURGIFLO TRUKIT (HEMOSTASIS) ×3 IMPLANT
SUT BONE WAX W31G (SUTURE) ×3 IMPLANT
SUT MON AB 3-0 SH 27 (SUTURE) ×2
SUT MON AB 3-0 SH27 (SUTURE) ×1 IMPLANT
SUT SILK 2 0 (SUTURE)
SUT SILK 2-0 18XBRD TIE 12 (SUTURE) IMPLANT
SUT VIC AB 2-0 CT1 18 (SUTURE) ×3 IMPLANT
SYR BULB IRRIGATION 50ML (SYRINGE) ×3 IMPLANT
SYR CONTROL 10ML LL (SYRINGE) ×6 IMPLANT
TAPE CLOTH 4X10 WHT NS (GAUZE/BANDAGES/DRESSINGS) ×3 IMPLANT
TAPE UMBILICAL COTTON 1/8X30 (MISCELLANEOUS) ×3 IMPLANT
TOWEL OR 17X24 6PK STRL BLUE (TOWEL DISPOSABLE) ×3 IMPLANT
TOWEL OR 17X26 10 PK STRL BLUE (TOWEL DISPOSABLE) ×6 IMPLANT
WATER STERILE IRR 1000ML POUR (IV SOLUTION) IMPLANT

## 2014-06-27 NOTE — H&P (Signed)
History of Present Illness  The patient is a 64 year old male who comes in today for a preoperative History and Physical. The patient is scheduled for a ACDF C5-7 to be performed by Dr. Duane Lope D. Rolena Infante, MD at [facility] on 06/27/14 . Please see the hospital record for complete dictated history and physical.  Additional reasons for visit:  Follow-up Neck is described as the following: The patient is being followed for their neck pain ("is actually worse"). Symptoms reported today include: pain (right scapula "all the time"), pain at night, aching, stiffness, popping, grinding, weakness and numbness (right arm to the finger tips), while the patient does not report symptoms of: swelling, locking, giving way, instability, pain with weightbearing, difficulty ambulating, difficulty arising from chair or pain with overhead motions. The patient feels that they are doing poorly and report their pain level to be 6-7 / 10 (increases with activity). The following medication has been used for pain control: antiinflammatory medication (Meloxicam he has not begun taking since the injection on 05/09/14. He is taking Robaxin qhs). The patient indicates that they have questions or concerns today regarding pain and Medications.  Vitals  06/18/2014 8:03 AM Weight: 177 lb Height: 67.5in Body Surface Area: 1.93 m Body Mass Index: 27.31 kg/m  Pulse: 74 (Regular)  BP: 159/86 (Sitting, Left Arm, Standard)  Physical Exam  General General Appearance-Very pleasant and pleasant. Orientation-Oriented X3. Gait-Normal.  Cardiovascular Examination of related systems reveals -well-developed, well-nourished and in no acute distress; alert and oriented x 3. Auscultation Rhythm - Regular. Heart Sounds - Normal heart sounds.  Peripheral Vascular Lower Extremity Palpation - Pulses - Bilateral - pulses intact. Calf - Bilateral - soft/supple to palpation, appearance is not suggestive of  DVT.  Neurologic Sensory Paresthesia - Right C6 and Right C7. Light Touch - Decreased - Right C6 and Right C7. Pain - Increased - Right C6 and Right C7. Reflexes 2/2 Normal - All. Testing Hoffman's Sign - No Hoffman's sign present.   Plans Transcription  Goal Of Surgery:Discussed that goal of surgery is to reduce pain and improve function and quality of life. Patient is aware that despite all appropriate treatment that there pain and function could be the same, worse, or different. Anterior cervical fusion:Risks of surgery include, but are not limited to: Throat pain, swallowing difficulty, hoarseness or change in voice, death, stroke, paralysis, nerve root damage/injury, bleeding, blood clots, loss of bowel/bladder control, hardware failure, or mal-position, spinal fluid leak, adjacent segment disease, non-union, need for further surgery, ongoing or worse pain, infection. Post-operative bleeding or swelling that could require emergent surgery. Anterior cervical fusion:Risks of surgery include, but are not limited to: Throat pain, swallowing difficulty, hoarseness or change in voice, death, stroke, paralysis, nerve root damage/injury, bleeding, blood clots, loss of bowel/bladder control, hardware failure, or mal-position, spinal fluid leak, adjacent segment disease, non-union, need for further surgery, ongoing or worse pain, infection. Post-operative bleeding or swelling that could require emergent surgery.  Assessments Transcription  At this point in time, his neck pain is just unbearable.  We have tried physical therapy, injection therapy, activity modification and medications, and he is just to the point where he would like something done. We have discussed the risks which include infection, bleeding, nerve damage, death, stroke, paralysis, failure to heal, ongoing or worsening pain, throat pain, swallowing difficulties, that it does not fuse and the need for surgery in the back of the  neck. Because this is a multilevel procedure, I am going to  request an external bone stimulator to be used to help augment the likelihood of solid arthrodesis. He is present for the dictation and will plan on doing this in the near future.

## 2014-06-27 NOTE — Brief Op Note (Signed)
06/27/2014  4:07 PM  PATIENT:  Terry [name]  64 y.o. male  PRE-OPERATIVE DIAGNOSIS:  CERVICAL SPONDYLOSIS   POST-OPERATIVE DIAGNOSIS:  CERVICAL SPONDYLOSIS   PROCEDURE:  Procedure(s): ANTERIOR CERVICAL DECOMPRESSION/DISCECTOMY FUSION C5-C7   (2 LEVELS) (N/A)  SURGEON:  Surgeon(s) and Role:    * Melina Schools, MD - Primary  PHYSICIAN ASSISTANT:   ASSISTANTS: none   ANESTHESIA:   general  EBL:  Total I/O In: 1300 [I.V.:1300] Out: 100 [Blood:100]  BLOOD ADMINISTERED:none  DRAINS: none   LOCAL MEDICATIONS USED:  MARCAINE     SPECIMEN:  No Specimen  DISPOSITION OF SPECIMEN:  N/A  COUNTS:  YES  TOURNIQUET:  * No tourniquets in log *  DICTATION: .Other Dictation: Dictation Number [id number]  PLAN OF CARE: Admit for overnight observation  PATIENT DISPOSITION:  PACU - hemodynamically stable.

## 2014-06-27 NOTE — Transfer of Care (Signed)
Immediate Anesthesia Transfer of Care Note  Patient: Terry [name]  Procedure(s) Performed: Procedure(s): ANTERIOR CERVICAL DECOMPRESSION/DISCECTOMY FUSION C5-C7   (2 LEVELS) (N/A)  Patient Location: PACU  Anesthesia Type:General  Level of Consciousness: awake, alert  and oriented  Airway & Oxygen Therapy: Patient Spontanous Breathing and Patient connected to nasal cannula oxygen  Post-op Assessment: Report given to PACU RN, Post -op Vital signs reviewed and stable and Patient moving all extremities X 4  Post vital signs: Reviewed and stable  Complications: No apparent anesthesia complications

## 2014-06-27 NOTE — Anesthesia Postprocedure Evaluation (Signed)
Anesthesia Post Note  Patient: Terry [name]  Procedure(s) Performed: Procedure(s) (LRB): ANTERIOR CERVICAL DECOMPRESSION/DISCECTOMY FUSION C5-C7   (2 LEVELS) (N/A)  Anesthesia type: general  Patient location: PACU  Post pain: Pain level controlled  Post assessment: Patient's Cardiovascular Status Stable  Last Vitals:  Filed Vitals:   06/27/14 1848  BP: 163/93  Pulse: 72  Temp: 36.8 C  Resp: 16    Post vital signs: Reviewed and stable  Level of consciousness: sedated  Complications: No apparent anesthesia complications

## 2014-06-27 NOTE — Anesthesia Preprocedure Evaluation (Addendum)
Anesthesia Evaluation  Patient identified by MRN, date of birth, ID band Patient awake    Reviewed: Allergy & Precautions, NPO status , Patient's Chart, lab work & pertinent test results  Airway Mallampati: II  TM Distance: >3 FB Neck ROM: Full    Dental  (+) Teeth Intact   Pulmonary neg pulmonary ROS,          Cardiovascular negative cardio ROS      Neuro/Psych negative neurological ROS     GI/Hepatic Neg liver ROS, GERD-  ,  Endo/Other  negative endocrine ROS  Renal/GU negative Renal ROS     Musculoskeletal  (+) Arthritis -,   Abdominal   Peds  Hematology negative hematology ROS (+)   Anesthesia Other Findings   Reproductive/Obstetrics                            Anesthesia Physical Anesthesia Plan  ASA: II  Anesthesia Plan: General   Post-op Pain Management:    Induction: Intravenous  Airway Management Planned: Oral ETT  Additional Equipment:   Intra-op Plan:   Post-operative Plan: Extubation in OR  Informed Consent: I have reviewed the patients History and Physical, chart, labs and discussed the procedure including the risks, benefits and alternatives for the proposed anesthesia with the patient or authorized representative who has indicated his/her understanding and acceptance.   Dental advisory given  Plan Discussed with: CRNA  Anesthesia Plan Comments:         Anesthesia Quick Evaluation

## 2014-06-27 NOTE — Discharge Instructions (Signed)

## 2014-06-27 NOTE — Anesthesia Procedure Notes (Signed)
Procedure Name: Intubation Date/Time: 06/27/2014 1:14 PM Performed by: Ollen Bowl Pre-anesthesia Checklist: Patient identified, Emergency Drugs available, Suction available, Patient being monitored and Timeout performed Patient Re-evaluated:Patient Re-evaluated prior to inductionOxygen Delivery Method: Circle system utilized and Simple face mask Preoxygenation: Pre-oxygenation with 100% oxygen Intubation Type: IV induction Ventilation: Mask ventilation without difficulty and Oral airway inserted - appropriate to patient size Laryngoscope Size: Mac and 4 Grade View: Grade II Tube type: Oral Tube size: 7.5 mm Number of attempts: 1 Airway Equipment and Method: Patient positioned with wedge pillow and Stylet Placement Confirmation: ETT inserted through vocal cords under direct vision,  positive ETCO2 and breath sounds checked- equal and bilateral Secured at: 23 cm Tube secured with: Tape Dental Injury: Teeth and Oropharynx as per pre-operative assessment

## 2014-06-28 ENCOUNTER — Encounter (HOSPITAL_COMMUNITY): Payer: Self-pay | Admitting: Orthopedic Surgery

## 2014-06-28 DIAGNOSIS — M4722 Other spondylosis with radiculopathy, cervical region: Secondary | ICD-10-CM | POA: Diagnosis not present

## 2014-06-28 NOTE — Evaluation (Signed)
Occupational Therapy Evaluation and Discharge Summary Patient Details Name: Terry [name] MRN: [id number] DOB: [birth year] Today's Date: 06/28/2014    History of Present Illness Pt is 64 yo male admitted for C5-6 C 6-7 ACD.    Clinical Impression   Pt admitted for the above surgery and is doing very well with mobility and adls.  Pt was educated on all adl techniques and care for his neck.  Pt with no further therapy needs.    Follow Up Recommendations  Supervision - Intermittent;No OT follow up    Equipment Recommendations  None recommended by OT    Recommendations for Other Services       Precautions / Restrictions Precautions Precautions: Cervical Required Braces or Orthoses: Cervical Brace Cervical Brace: For comfort Restrictions Weight Bearing Restrictions: No      Mobility Bed Mobility Overal bed mobility: Independent             General bed mobility comments: no assist needed.  Transfers Overall transfer level: Independent Equipment used: None             General transfer comment: Pt safe and independent with transfers.    Balance Overall balance assessment: Independent                                          ADL Overall ADL's : Independent                                       General ADL Comments: Pt did not need assist with adls.  Reviewed techniques for bathing and dressing.     Vision                     Perception     Praxis      Pertinent Vitals/Pain Pain Assessment: 0-10 Pain Score: 4  Pain Location: neck Pain Descriptors / Indicators: Aching Pain Intervention(s): Monitored during session;Repositioned;Ice applied     Hand Dominance Right   Extremity/Trunk Assessment Upper Extremity Assessment Upper Extremity Assessment: Overall WFL for tasks assessed (within limits of new neck surgery)   Lower Extremity Assessment Lower Extremity Assessment: Overall WFL for tasks assessed    Cervical / Trunk Assessment Cervical / Trunk Assessment: Other exceptions (new neck surgery) Cervical / Trunk Exceptions: new ACD surgery   Communication Communication Communication: No difficulties   Cognition Arousal/Alertness: Awake/alert Behavior During Therapy: WFL for tasks assessed/performed Overall Cognitive Status: Within Functional Limits for tasks assessed                     General Comments       Exercises       Shoulder Instructions      Home Living Family/patient expects to be discharged to:: Private residence Living Arrangements: Spouse/significant other Available Help at Discharge: Family;Available 24 hours/day Type of Home: House Home Access: Stairs to enter [employer] of Steps: 5   Home Layout: Multi-level;Able to live on main level with bedroom/bathroom Alternate Level Stairs-Number of Steps: 12 to each   Bathroom Shower/Tub: Walk-in shower;Door   [employer] Toilet: Standard     Home Equipment: None          Prior Functioning/Environment Level of Independence: Independent        Comments: Pt did well with all  adls, walked in hallway Ily and walked up and down one flight of stairs with assist of handrail only.    OT Diagnosis:     OT Problem List:     OT Treatment/Interventions:      OT Goals(Current goals can be found in the care plan section) Acute Rehab OT Goals Patient Stated Goal: to go home. OT Goal Formulation: All assessment and education complete, DC therapy  OT Frequency:     Barriers to D/C:            Co-evaluation              End of Session Equipment Utilized During Treatment: Cervical collar Nurse Communication: Mobility status  Activity Tolerance: Patient tolerated treatment well Patient left: in chair;with call bell/phone within reach   Time: 0820-0836 OT Time Calculation (min): 16 min Charges:  OT General Charges $OT Visit: 1 Procedure OT Evaluation $Initial OT Evaluation Tier  I: 1 Procedure G-Codes: OT G-codes **NOT FOR INPATIENT CLASS** Functional Assessment Tool Used: clinical judgement Functional Limitation: Self care Self Care Current Status (R4431): At least 1 percent but less than 20 percent impaired, limited or restricted Self Care Goal Status (V4008): At least 1 percent but less than 20 percent impaired, limited or restricted Self Care Discharge Status [phone]): At least 1 percent but less than 20 percent impaired, limited or restricted  Glenford Peers 06/28/2014, 8:47 AM [phone]

## 2014-06-28 NOTE — Progress Notes (Signed)
PT Cancellation Note  Patient Details Name: Terry [name] MRN: [id number] DOB: Oct 07, 1950   Cancelled Treatment:    Reason Eval/Treat Not Completed: PT screened, no needs identified, will sign off   Irwin Brakeman F 06/28/2014, 9:11 AM  [facility] Acute Rehabilitation [phone] (pager)

## 2014-06-28 NOTE — Progress Notes (Signed)
.     Subjective: 1 Day Post-Op Procedure(s) (LRB): ANTERIOR CERVICAL DECOMPRESSION/DISCECTOMY FUSION C5-C7   (2 LEVELS) (N/A) Patient reports pain as 2 on 0-10 scale.   Denies CP or SOB.  Voiding without difficulty. Positive flatus. Objective: Vital signs in last 24 hours: Temp:  [97.4 F (36.3 C)-98.8 F (37.1 C)] 98.4 F (36.9 C) (01/28 0823) Pulse Rate:  [65-96] 65 (01/28 0823) Resp:  [8-21] 20 (01/28 0823) BP: (137-169)/(77-97) 147/89 mmHg (01/28 0823) SpO2:  [94 %-99 %] 94 % (01/28 0823)  Intake/Output from previous day: 01/27 0701 - 01/28 0700 In: 1300 [I.V.:1300] Out: 101 [Urine:1; Blood:100] Intake/Output this shift: Total I/O In: 480 [P.O.:480] Out: -   Labs: No results for input(s): HGB in the last 72 hours. No results for input(s): WBC, RBC, HCT, PLT in the last 72 hours. No results for input(s): NA, K, CL, CO2, BUN, CREATININE, GLUCOSE, CALCIUM in the last 72 hours. No results for input(s): LABPT, INR in the last 72 hours.  Physical Exam: Neurologically intact ABD soft Neurovascular intact Intact pulses distally Incision: dressing C/D/I Compartment soft  Assessment/Plan: 1 Day Post-Op Procedure(s) (LRB): ANTERIOR CERVICAL DECOMPRESSION/DISCECTOMY FUSION C5-C7   (2 LEVELS) (N/A) Advance diet Up with therapy D/C IV fluids  Ok for d/c to home - xrays satisfactory  Santanna Olenik D for Dr. Melina Schools [facility] [phone] 06/28/2014, 10:57 AM

## 2014-06-28 NOTE — Progress Notes (Signed)
Patient alert and oriented, mae's well, voiding adequate amount of urine, swallowing without difficulty, no c/o pain. Patient discharged home with family. Script and discharged instructions given to patient. Patient and family stated understanding of d/c instructions given and has an appointment with MD. Aisha Logyn Kendrick RN 

## 2014-06-28 NOTE — Op Note (Signed)
NAMEWYNTER, Terry [name]                ACCOUNT NO.:  [id number]  MEDICAL RECORD NO.:  [id number]  LOCATION:  3C08C                        FACILITY:  [facility]  PHYSICIAN:  Dahlia Bailiff, MD    DATE OF BIRTH:  1950-09-15  DATE OF PROCEDURE:  06/27/2014 DATE OF DISCHARGE:                              OPERATIVE REPORT   PREOPERATIVE DIAGNOSIS:  Two-level cervical spondylotic radiculopathy C5- 6, C6-7.  POSTOPERATIVE DIAGNOSIS:  Two-level cervical spondylotic radiculopathy C5-6, C6-7.  OPERATIVE PROCEDURE:  Two-level anterior cervical diskectomy and fusion C5-6, C6-7.  Titan titanium size 7 lordotic interbody spacer packed with DBX mix plus local bone from decompression with a 34-mm anterior cervical DePuy Skyline plate affixed with 09-NA screws into the body of C5.  COMPLICATIONS:  No intraoperative complications.  HISTORY:  This is a very pleasant gentleman who has been complaining of severe neck and radicular right arm pain for sometime.  Despite appropriate conservative management, the patient continued to deteriorate.  As a result, we elected to proceed with surgery.  All appropriate risks, benefits, and alternatives were discussed and consent was obtained.  OPERATIVE NOTE:  The patient was brought to the operating room, placed supine on the operating table.  After successful induction of general anesthesia and endotracheal intubation, TEDs, SCDs were applied.  He was placed on the operating room table.  Inflatable tamp was placed between the shoulder blades.  Restraints were put on the wrist to allow for intraoperative traction to be applied for imaging.  The neck was then prepped and draped in a standard fashion.  Time-out was taken to confirm patient, procedure, and all other pertinent important data.  After the time-out was taken, we then proceeded with the surgery.  X-ray was used in the lateral planes to identify the C6 vertebral body. I then infiltrated the incisions  proposed with 0.25% Marcaine.  A midline incision was made starting at midline of the neck, anterior cervical spine and proceeding to the left.  Sharp dissection was carried out down to the platysma.  The platysma was sharply incised.  I then sharply dissected along the medial border of the sternocleidomastoid performing a standard Smith-Robinson approach to the anterior cervical spine.  Once I was through the prevertebral fascia, I could palpate the anterior longitudinal ligament.  I retracted and mobilized the esophagus and trachea to the right and held the retractor with an appendiceal.  I palpated and protected the carotid sheath with my finger.  A needle was placed into the C5-6 disk space and an x-ray was taken to confirm I was at the appropriate level.  Once this was done, I mobilized the longus colli muscles out to the level of the uncovertebral joint using bipolar electrocautery from the midbody of C5 to the midbody of C7.  I then used a double-action Leksell rongeur to remove the large anterior osteophytes at the disk space levels C5-6 and C6-7.  A self-retaining retractor was then placed underneath the longus colli muscle.  The endotracheal cuff was deflated and I expanded the retractors and reinflated the endotracheal cuff.  At this point, an annulotomy at C6-7 was performed with a 15 blade scalpel.  Then, I used  a combination of pituitary rongeurs, curettes, and Kerrison rongeurs to remove the bulk of the disk material.  I then removed using a 2-mm Kerrison overhanging osteophyte from the inferior aspect of the C6 vertebral body.  I placed distraction pins into the bodies of C6 and C7, distracted the intervertebral space and then maintained the distraction.  I then used a fine curette to continue my diskectomy removing the posterior annulus.  I then used a 1- mm Kerrison to resect the posterior osteophytes from the body of C6 and C7.  I used a fine nerve hook to develop a  plane underneath the PLL, and I resected the PLL with a 1-mm Kerrison.  This allowed me to get under the uncovertebral joint and adequate decompression on both sides.  Care was taken more on the right side as this was the more symptomatic side.  At this point, I had an adequate decompression and removal of osteophytes.  I rasped the endplates until I had bleeding subchondral endplates.  I then measured with trial devices and placed an 8-medium lordotic Titan titanium spacer packed with DBX mix.  Once this was done, I removed the distraction pins and then repositioned at C5 and C6.  I then using the same technique performed a diskectomy at this level.  Again, I took down the posterior longitudinal ligament using my 1-mm Kerrison rongeur.  There was hard disk osteophyte at right lateral corner which I also resected.  Again, I rasped the endplates and this time I placed a 7 medium lordotic cage.  The cage was packed with DBX mix plus his local bone from removal of the osteophytes.  Both cages had excellent fixation.  I then removed the distraction pins and then placed a 34-mm anterior cervical plate which I had contoured to the anterior surface of the spine and secured it with self-drilling screws into the bodies of C5 and C7.  All 4 screws had excellent purchase and strength.  I then placed two 40-mm screws into the body of C6.  All screws had excellent purchase and were tightened down to finger tight.  I then locked them according to manufacturer's standards to prevent backout.  I then checked the esophagus again and cleared it and made sure it was not entrapped beneath the plate.  Once I confirmed this, I irrigated the wound copiously with normal saline, and confirmed hemostasis using bipolar electrocautery.  I then returned the trachea and esophagus to midline. I then closed the platysma with interrupted 2-0 Vicryl sutures.  A 3-0 Monocryl was used to close the skin edges.   Steri-Strips and dry dressing were applied.  At the end of the case, all needle and sponge counts were correct.  There were no adverse intraoperative events. The patient was ultimately extubated, Aspen collar was applied.  He was transferred to PACU without incident.     Dahlia Bailiff, MD     DDB/MEDQ  D:  06/27/2014  T:  06/28/2014  Job:  [id number]
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
## 2014-07-16 DIAGNOSIS — L500 Allergic urticaria: Secondary | ICD-10-CM | POA: Insufficient documentation

## 2015-02-08 DIAGNOSIS — Z860100 Personal history of colon polyps, unspecified: Secondary | ICD-10-CM | POA: Insufficient documentation

## 2015-02-12 ENCOUNTER — Encounter: Payer: Self-pay | Admitting: Internal Medicine

## 2015-03-22 ENCOUNTER — Ambulatory Visit (AMBULATORY_SURGERY_CENTER): Payer: Self-pay

## 2015-03-22 VITALS — Ht 68.0 in | Wt 183.0 lb

## 2015-03-22 DIAGNOSIS — Z86010 Personal history of colonic polyps: Secondary | ICD-10-CM

## 2015-03-22 MED ORDER — NA SULFATE-K SULFATE-MG SULF 17.5-3.13-1.6 GM/177ML PO SOLN: 1.0000 | Freq: Once | ORAL | Status: DC

## 2015-03-22 NOTE — Progress Notes (Signed)
No egg or soy allergies Not on home 02 No previous anesthesia complications No diet or weight loss meds 

## 2015-04-05 ENCOUNTER — Ambulatory Visit (AMBULATORY_SURGERY_CENTER): Payer: 59 | Admitting: Internal Medicine

## 2015-04-05 ENCOUNTER — Encounter: Payer: Self-pay | Admitting: Internal Medicine

## 2015-04-05 VITALS — BP 121/60 | HR 66 | Temp 97.7°F | Resp 17 | Ht 68.0 in | Wt 183.0 lb

## 2015-04-05 DIAGNOSIS — Z86010 Personal history of colonic polyps: Secondary | ICD-10-CM | POA: Diagnosis not present

## 2015-04-05 MED ORDER — SODIUM CHLORIDE 0.9 % IV SOLN: 500.0000 mL | INTRAVENOUS | Status: DC

## 2015-04-05 NOTE — Patient Instructions (Signed)
YOU HAD AN ENDOSCOPIC PROCEDURE TODAY AT THE  ENDOSCOPY CENTER:   Refer to the procedure report that was given to you for any specific questions about what was found during the examination.  If the procedure report does not answer your questions, please call your gastroenterologist to clarify.  If you requested that your care partner not be given the details of your procedure findings, then the procedure report has been included in a sealed envelope for you to review at your convenience later.  YOU SHOULD EXPECT: Some feelings of bloating in the abdomen. Passage of more gas than usual.  Walking can help get rid of the air that was put into your GI tract during the procedure and reduce the bloating. If you had a lower endoscopy (such as a colonoscopy or flexible sigmoidoscopy) you may notice spotting of blood in your stool or on the toilet paper. If you underwent a bowel prep for your procedure, you may not have a normal bowel movement for a few days.  Please Note:  You might notice some irritation and congestion in your nose or some drainage.  This is from the oxygen used during your procedure.  There is no need for concern and it should clear up in a day or so.  SYMPTOMS TO REPORT IMMEDIATELY:   Following lower endoscopy (colonoscopy or flexible sigmoidoscopy):  Excessive amounts of blood in the stool  Significant tenderness or worsening of abdominal pains  Swelling of the abdomen that is new, acute  Fever of 100F or higher   For urgent or emergent issues, a gastroenterologist can be reached at any hour by calling [phone].   DIET: Your first meal following the procedure should be a small meal and then it is ok to progress to your normal diet. Heavy or fried foods are harder to digest and may make you feel nauseous or bloated.  Likewise, meals heavy in dairy and vegetables can increase bloating.  Drink plenty of fluids but you should avoid alcoholic beverages for 24  hours.  ACTIVITY:  You should plan to take it easy for the rest of today and you should NOT DRIVE or use heavy machinery until tomorrow (because of the sedation medicines used during the test).    FOLLOW UP: Our staff will call the number listed on your records the next business day following your procedure to check on you and address any questions or concerns that you may have regarding the information given to you following your procedure. If we do not reach you, we will leave a message.  However, if you are feeling well and you are not experiencing any problems, there is no need to return our call.  We will assume that you have returned to your regular daily activities without incident.  If any biopsies were taken you will be contacted by phone or by letter within the next 1-3 weeks.  Please call us at [phone] if you have not heard about the biopsies in 3 weeks.    SIGNATURES/CONFIDENTIALITY: You and/or your care partner have signed paperwork which will be entered into your electronic medical record.  These signatures attest to the fact that that the information above on your After Visit Summary has been reviewed and is understood.  Full responsibility of the confidentiality of this discharge information lies with you and/or your care-partner.  Diverticulosis and high fiber diet information given. 

## 2015-04-05 NOTE — Op Note (Signed)
[facility]  [address] [place], [phone]   COLONOSCOPY PROCEDURE REPORT  PATIENT: Terry [name], Terry [name]  MR#: [id number] BIRTHDATE: 1950-08-10 , 73  yrs. old GENDER: male ENDOSCOPIST: Eustace Quail, MD REFERRED WN:[id number] Program Recall PROCEDURE DATE:  04/05/2015 PROCEDURE:   Colonoscopy, surveillance First Screening Colonoscopy - Avg.  risk and is 50 yrs.  old or older - No.  Prior Negative Screening - Now for repeat screening. N/A  History of Adenoma - Now for follow-up colonoscopy & has been > or = to 3 yrs.  Yes hx of adenoma.  Has been 3 or more years since last colonoscopy.  Polyps removed today? No Recommend repeat exam, <10 yrs? No ASA CLASS:   Class II INDICATIONS:Surveillance due to prior colonic neoplasia and PH Colon Adenoma.   . Previous examinations 2006 and 2011 with small tubular adenomas MEDICATIONS: Propofol 200 mg IV and Monitored anesthesia care  DESCRIPTION OF PROCEDURE:   After the risks benefits and alternatives of the procedure were thoroughly explained, informed consent was obtained.  The digital rectal exam revealed no abnormalities of the rectum.   The LB [id number]  endoscope was introduced through the anus and advanced to the cecum, which was identified by both the appendix and ileocecal valve. No adverse events experienced.   The quality of the prep was excellent. (Suprep was used)  The instrument was then slowly withdrawn as the colon was fully examined. Estimated blood loss is zero unless otherwise noted in this procedure report.      COLON FINDINGS: There was moderate diverticulosis noted in the sigmoid colon.   The examination was otherwise normal.  Retroflexed views revealed internal hemorrhoids. The time to cecum = 2.5 Withdrawal time = 9.7   The scope was withdrawn and the procedure completed. COMPLICATIONS: There were no immediate complications.  ENDOSCOPIC IMPRESSION: 1.   Moderate diverticulosis was  noted in the sigmoid colon 2.   The examination was otherwise normal  RECOMMENDATIONS: 1. Continue current colorectal surveillance recommendations with a repeat colonoscopy in 10 years.  eSigned:  Eustace Quail, MD 04/05/2015 3:26 PM   cc: The Patient and Shon Baton, MD

## 2015-04-05 NOTE — Progress Notes (Signed)
Report to PACU, RN, vss, BBS= Clear.  

## 2015-04-08 ENCOUNTER — Telehealth: Payer: Self-pay

## 2015-04-08 NOTE — Telephone Encounter (Signed)
  Follow up Call-  Call back number 04/05/2015  Post procedure Call Back phone  # [phone]  Permission to leave phone message Yes     Patient questions:  Do you have a fever, pain , or abdominal swelling? No. Pain Score  0 *  Have you tolerated food without any problems? Yes.    Have you been able to return to your normal activities? Yes.    Do you have any questions about your discharge instructions: Diet   No. Medications  No. Follow up visit  No.  Do you have questions or concerns about your Care? No.  Actions: * If pain score is 4 or above: No action needed, pain <4.

## 2015-06-26 ENCOUNTER — Encounter: Payer: Self-pay | Admitting: Internal Medicine

## 2015-07-11 DIAGNOSIS — R252 Cramp and spasm: Secondary | ICD-10-CM | POA: Insufficient documentation

## 2015-07-11 DIAGNOSIS — N5082 Scrotal pain: Secondary | ICD-10-CM | POA: Insufficient documentation

## 2015-07-15 ENCOUNTER — Other Ambulatory Visit: Payer: Self-pay | Admitting: Internal Medicine

## 2015-07-15 DIAGNOSIS — K219 Gastro-esophageal reflux disease without esophagitis: Secondary | ICD-10-CM

## 2015-07-15 DIAGNOSIS — N5082 Scrotal pain: Secondary | ICD-10-CM

## 2015-07-17 ENCOUNTER — Ambulatory Visit (HOSPITAL_COMMUNITY): Payer: 59

## 2015-07-18 ENCOUNTER — Ambulatory Visit (HOSPITAL_COMMUNITY)
Admission: RE | Admit: 2015-07-18 | Discharge: 2015-07-18 | Disposition: A | Discharge status: Home / Self Care | Payer: BLUE CROSS/BLUE SHIELD | Source: Ambulatory Visit | Attending: Internal Medicine | Admitting: Internal Medicine

## 2015-07-18 DIAGNOSIS — K829 Disease of gallbladder, unspecified: Secondary | ICD-10-CM | POA: Insufficient documentation

## 2015-07-18 DIAGNOSIS — K219 Gastro-esophageal reflux disease without esophagitis: Secondary | ICD-10-CM | POA: Diagnosis not present

## 2015-07-18 DIAGNOSIS — R252 Cramp and spasm: Secondary | ICD-10-CM | POA: Insufficient documentation

## 2015-07-18 DIAGNOSIS — N5082 Scrotal pain: Secondary | ICD-10-CM

## 2015-07-18 DIAGNOSIS — N281 Cyst of kidney, acquired: Secondary | ICD-10-CM | POA: Insufficient documentation

## 2015-09-30 ENCOUNTER — Other Ambulatory Visit (HOSPITAL_COMMUNITY): Payer: Self-pay | Admitting: Internal Medicine

## 2015-09-30 DIAGNOSIS — K829 Disease of gallbladder, unspecified: Secondary | ICD-10-CM

## 2015-10-08 ENCOUNTER — Encounter (HOSPITAL_COMMUNITY)
Admission: RE | Admit: 2015-10-08 | Discharge: 2015-10-08 | Disposition: A | Discharge status: Home / Self Care | Payer: BLUE CROSS/BLUE SHIELD | Source: Ambulatory Visit | Attending: Internal Medicine | Admitting: Internal Medicine

## 2015-10-08 DIAGNOSIS — K829 Disease of gallbladder, unspecified: Secondary | ICD-10-CM | POA: Diagnosis present

## 2015-10-08 MED ORDER — TECHNETIUM TC 99M MEBROFENIN IV KIT
5.0600 | PACK | Freq: Once | INTRAVENOUS | Status: AC | PRN
  Administered 2015-10-08: 5 via INTRAVENOUS

## 2015-11-22 ENCOUNTER — Other Ambulatory Visit: Payer: Self-pay | Admitting: Orthopedic Surgery

## 2015-11-22 DIAGNOSIS — Z4789 Encounter for other orthopedic aftercare: Secondary | ICD-10-CM

## 2015-11-29 ENCOUNTER — Ambulatory Visit
Admission: RE | Admit: 2015-11-29 | Discharge: 2015-11-29 | Disposition: A | Discharge status: Home / Self Care | Payer: BLUE CROSS/BLUE SHIELD | Source: Ambulatory Visit | Attending: Orthopedic Surgery | Admitting: Orthopedic Surgery

## 2015-11-29 DIAGNOSIS — Z4789 Encounter for other orthopedic aftercare: Secondary | ICD-10-CM

## 2016-02-28 DIAGNOSIS — M5416 Radiculopathy, lumbar region: Secondary | ICD-10-CM | POA: Insufficient documentation

## 2016-03-03 ENCOUNTER — Other Ambulatory Visit: Payer: Self-pay | Admitting: Internal Medicine

## 2016-03-03 DIAGNOSIS — M545 Low back pain, unspecified: Secondary | ICD-10-CM

## 2016-03-03 DIAGNOSIS — M5416 Radiculopathy, lumbar region: Secondary | ICD-10-CM

## 2016-03-04 ENCOUNTER — Ambulatory Visit
Admission: RE | Admit: 2016-03-04 | Discharge: 2016-03-04 | Disposition: A | Discharge status: Home / Self Care | Payer: BLUE CROSS/BLUE SHIELD | Source: Ambulatory Visit | Attending: Internal Medicine | Admitting: Internal Medicine

## 2016-03-04 DIAGNOSIS — M5416 Radiculopathy, lumbar region: Secondary | ICD-10-CM

## 2016-03-04 DIAGNOSIS — M545 Low back pain, unspecified: Secondary | ICD-10-CM

## 2016-03-06 ENCOUNTER — Other Ambulatory Visit: Payer: BLUE CROSS/BLUE SHIELD

## 2016-03-25 DIAGNOSIS — M48062 Spinal stenosis, lumbar region with neurogenic claudication: Secondary | ICD-10-CM | POA: Diagnosis not present

## 2016-04-08 DIAGNOSIS — M47816 Spondylosis without myelopathy or radiculopathy, lumbar region: Secondary | ICD-10-CM | POA: Diagnosis not present

## 2016-04-08 DIAGNOSIS — M48062 Spinal stenosis, lumbar region with neurogenic claudication: Secondary | ICD-10-CM | POA: Diagnosis not present

## 2016-04-20 DIAGNOSIS — M4316 Spondylolisthesis, lumbar region: Secondary | ICD-10-CM | POA: Diagnosis not present

## 2016-04-20 DIAGNOSIS — Z4789 Encounter for other orthopedic aftercare: Secondary | ICD-10-CM | POA: Diagnosis not present

## 2016-04-20 DIAGNOSIS — M48062 Spinal stenosis, lumbar region with neurogenic claudication: Secondary | ICD-10-CM | POA: Diagnosis not present

## 2016-05-18 DIAGNOSIS — M47816 Spondylosis without myelopathy or radiculopathy, lumbar region: Secondary | ICD-10-CM | POA: Diagnosis not present

## 2016-06-10 DIAGNOSIS — M47816 Spondylosis without myelopathy or radiculopathy, lumbar region: Secondary | ICD-10-CM | POA: Diagnosis not present

## 2016-06-27 DIAGNOSIS — M47816 Spondylosis without myelopathy or radiculopathy, lumbar region: Secondary | ICD-10-CM | POA: Diagnosis not present

## 2016-06-27 DIAGNOSIS — M48062 Spinal stenosis, lumbar region with neurogenic claudication: Secondary | ICD-10-CM | POA: Diagnosis not present

## 2016-06-27 DIAGNOSIS — M4316 Spondylolisthesis, lumbar region: Secondary | ICD-10-CM | POA: Diagnosis not present

## 2016-08-13 DIAGNOSIS — M4316 Spondylolisthesis, lumbar region: Secondary | ICD-10-CM | POA: Diagnosis not present

## 2016-08-17 DIAGNOSIS — M7662 Achilles tendinitis, left leg: Secondary | ICD-10-CM | POA: Diagnosis not present

## 2016-08-18 ENCOUNTER — Other Ambulatory Visit: Payer: Self-pay | Admitting: Neurological Surgery

## 2016-08-25 ENCOUNTER — Encounter (HOSPITAL_COMMUNITY): Payer: Self-pay

## 2016-08-25 ENCOUNTER — Encounter (HOSPITAL_COMMUNITY)
Admission: RE | Admit: 2016-08-25 | Discharge: 2016-08-25 | Disposition: A | Discharge status: Home / Self Care | Payer: Medicare Other | Source: Ambulatory Visit | Attending: Neurological Surgery | Admitting: Neurological Surgery

## 2016-08-25 DIAGNOSIS — M4316 Spondylolisthesis, lumbar region: Secondary | ICD-10-CM | POA: Diagnosis not present

## 2016-08-25 DIAGNOSIS — Z01810 Encounter for preprocedural cardiovascular examination: Secondary | ICD-10-CM | POA: Insufficient documentation

## 2016-08-25 DIAGNOSIS — M7662 Achilles tendinitis, left leg: Secondary | ICD-10-CM | POA: Diagnosis not present

## 2016-08-25 DIAGNOSIS — Z01812 Encounter for preprocedural laboratory examination: Secondary | ICD-10-CM | POA: Diagnosis not present

## 2016-08-25 HISTORY — DX: Dorsalgia, unspecified: M54.9

## 2016-08-25 HISTORY — DX: Personal history of colonic polyps: Z86.010

## 2016-08-25 HISTORY — DX: Other chronic pain: G89.29

## 2016-08-25 HISTORY — DX: Personal history of colon polyps, unspecified: Z86.0100

## 2016-08-25 HISTORY — DX: Allergic rhinitis due to pollen: J30.1

## 2016-08-25 LAB — TYPE AND SCREEN
ABO/RH(D): B NEG
Antibody Screen: NEGATIVE

## 2016-08-25 LAB — BASIC METABOLIC PANEL
ANION GAP: 10 (ref 5–15)
BUN: 14 mg/dL (ref 6–20)
CO2: 25 mmol/L (ref 22–32)
Calcium: 9.5 mg/dL (ref 8.9–10.3)
Chloride: 104 mmol/L (ref 101–111)
Creatinine, Ser: 1.12 mg/dL (ref 0.61–1.24)
GFR calc Af Amer: >60 mL/min (ref >60)
GFR calc non Af Amer: >60 mL/min (ref >60)
GLUCOSE: 101 mg/dL — AB (ref 65–99)
POTASSIUM: 4.3 mmol/L (ref 3.5–5.1)
Sodium: 139 mmol/L (ref 135–145)

## 2016-08-25 LAB — CBC
HEMATOCRIT: 46.6 % (ref 39.0–52.0)
HEMOGLOBIN: 16.1 g/dL (ref 13.0–17.0)
MCH: 29.3 pg (ref 26.0–34.0)
MCHC: 34.5 g/dL (ref 30.0–36.0)
MCV: 84.7 fL (ref 78.0–100.0)
Platelets: 207 10*3/uL (ref 150–400)
RBC: 5.5 MIL/uL (ref 4.22–5.81)
RDW: 12.9 % (ref 11.5–15.5)
WBC: 6.5 10*3/uL (ref 4.0–10.5)

## 2016-08-25 LAB — SURGICAL PCR SCREEN
MRSA, PCR: NEGATIVE
STAPHYLOCOCCUS AUREUS: NEGATIVE

## 2016-08-25 LAB — ABO/RH: ABO/RH(D): B NEG

## 2016-08-25 MED ORDER — CHLORHEXIDINE GLUCONATE CLOTH 2 % EX PADS: 6.0000 | MEDICATED_PAD | Freq: Once | CUTANEOUS | Status: DC

## 2016-08-25 NOTE — Pre-Procedure Instructions (Signed)
Terry [name]  08/25/2016      [id number] - [place], [place] - [address]. AT [address] [place] [address] Phone: [phone] Fax: [phone]  [facility] [place], [place] - [address] [place] [address] Phone: [phone] Fax: [phone]  [place], [place] [address] Phone: [phone] Fax: [phone]    Your procedure is scheduled on Mon, April 2 @ 11:35 AM  Report to [facility] at 9:30 AM  Call this number if you have problems the morning of surgery:  [phone]   Remember:  Do not eat food or drink liquids after midnight.  Take these medicines the morning of surgery with A SIP OF WATER Zyrtec(Cetirizine),Eye Drops,and Omeprazole(Prilosec)              Stop taking your Mobic,Fish Oil,and Multivitamin along with any Herbal Medications. No Goody's,BC's,Aleve,Advil,Motrin,or Ibuprofen.    Do not wear jewelry.  Do not wear lotions, powders,colognes, or deoderant.  Men may shave face and neck.  Do not bring valuables to the hospital.  [facility] is not responsible for any belongings or valuables.  Contacts, dentures or bridgework may not be worn into surgery.  Leave your suitcase in the car.  After surgery it may be brought to your room.  For patients admitted to the hospital, discharge time will be determined by your treatment team.  Patients discharged the day of surgery will not be allowed to drive home.    [facility]  Before surgery, you can play an important role.  Because skin is not sterile, your skin needs to be as free of germs as possible.  You can reduce the number of germs on you skin by washing with CHG (chlorahexidine gluconate) soap before surgery.  CHG is an antiseptic cleaner which kills germs and  bonds with the skin to continue killing germs even after washing.  Please DO NOT use if you have an allergy to CHG or antibacterial soaps.  If your skin becomes reddened/irritated stop using the CHG and inform your nurse when you arrive at Short Stay.  Do not shave (including legs and underarms) for at least 48 hours prior to the first CHG shower.  You may shave your face.  Please follow these instructions carefully:   1.  Shower with CHG Soap the night before surgery and the                                morning of Surgery.  2.  If you choose to wash your hair, wash your hair first as usual with your       normal shampoo.  3.  After you shampoo, rinse your hair and body thoroughly to remove the                      Shampoo.  4.  Use CHG as you would any other liquid soap.  You can apply chg directly       to the skin and wash gently with scrungie or a clean washcloth.  5.  Apply the CHG Soap to your body ONLY FROM THE NECK DOWN.        Do not use on open wounds or open sores.  Avoid contact with your eyes,       ears, mouth and genitals (private parts).  Wash genitals (private parts)       with your normal soap.  6.  Wash thoroughly, paying special attention to the area where your surgery        will be performed.  7.  Thoroughly rinse your body with warm water from the neck down.  8.  DO NOT shower/wash with your normal soap after using and rinsing off       the CHG Soap.  9.  Pat yourself dry with a clean towel.            10.  Wear clean pajamas.            11.  Place clean sheets on your bed the night of your first shower and do not        sleep with pets.  Day of Surgery  Do not apply any lotions/deoderants the morning of surgery.  Please wear clean clothes to the hospital/surgery center.     Please read over the following fact sheets that you were given. Pain Booklet, Coughing and Deep Breathing, MRSA Information and Surgical Site Infection Prevention

## 2016-08-25 NOTE — Progress Notes (Signed)
Cardiologist is Dr.Ganji-saw him 10 yrs ago. Dads side of family had heart issues so he went  Medical Md is Dr.John Virgina Jock  Echo/stress test/heart cath done in 2004  EKG denies in past yr  CXR denies in past yr

## 2016-08-30 NOTE — H&P (Signed)
CHIEF COMPLAINT: Back pain and sciatica.  HISTORY OF PRESENT ILLNESS: Mr. Wurtz is a 66 year old, right-handed individual, who tells me that he has had some issues with his neck, and he has had cervical surgery by Dr. Rolena Infante a few years ago. Then, he has developed some problems with his back. He was seen and treated by Dr. Rolena Infante initially, and had a series of injections done by Dr. Nelva Bush. He notes that what sounds like a series of facet blocks actually made his symptoms somewhat worse, and he has had continued problems, and brings with him an MRI of the lumbar spine that was performed on March 04, 2016. This study demonstrates that the patient has evidence of multilevel degenerative changes with the worst findings at the level of L3-4, where he has a grade 1 spondylolisthesis causing a severe stenosis of the central portion of the canal. He also has advanced degenerative changes at L2-3 and L1-2 and T12-L1, where the discs are significantly collapsed, and it appears that he has an old healed L1 compression fracture that may be causing a kyphotic curvature at that level. He also has moderate degenerative changes at L4-L5, but L5-S1 appears to be completely healthy. The central canal is amply patent below the level of L3-4, whereas at L3-4, he has central canal stenosis with bilateral lateral recess stenosis, perhaps worse on the left than on the right. The patient notes that a number of weeks ago, he was moving about because of his back pain and felt a sudden sharp pull in his left Achilles region. He fears that he may have had an Achilles tendon pull. This problem is also bothering him in that he cannot stand up onto his toes, and has limited his walking capacity. He notes that the pain pattern can be very unpredictable. Certain days, he can walk well, and he can walk a mile unimpeded, and other days, he can barely get 0.25 mile or so before he has to stop and rest. He also notes that his sleeping pattern  has become very irregular because of the unpredictability of the pain. He finds himself waking in the middle of the night to take a hot shower in an effort to get some relief, sometimes having to resort to using a recliner to get some comfort.  PAST MEDICAL HISTORY: Reveals that his health in general has been quite good. He reports no significant medical issues, but he did have some prostate cancer and had a total prostatectomy.   He had a trigger finger surgery in 2012, prostate surgery in 2014, and a cervical fusion in 2016.  ALLERGIES: He notes an allergy to penicillin.  CURRENT MEDICATIONS: Include Prilosec, Zyrtec, MegaRed, Meloxicam, and Ranitidine and Crestor combination.  SYSTEMS REVIEW: Notable for ulcers and gastroesophageal reflux disease. He has some leg pain while walking, high cholesterol, arthritis, neck pain, arm weakness, leg weakness, back pain, arm pain, joint pain and swelling, and some sinus issues on a 14-point review sheet noted in the office today.  PHYSICAL EXAMINATION: I note that he walks with a mild antalgia involving the left lower extremity. He does not have full gastroc strength, but this may be due to the tendinous concern that he has in that left leg. He has an absent Achilles reflex in both lower extremities. His patellar reflexes are 1+. His motor strength, otherwise more proximally, appears intact including his iliopsoas and his tibialis anterior.   Today in the office, to further his workup, I obtained a lateral flexion-extension film  of the lumbar spine. The film demonstrates that the patient has about 3 mm of anterolisthesis between L3 and L4 on top of his already present anterolisthesis of approximately 3 mm in the neutral position. This reduces nicely to a neutral position, but is accentuated with flexion and is also accentuated in extension. He has the kyphotic deformity across the thoracolumbar junction as noted previously, and this is a fairly fixed  abnormality. L5-S1 and L4-5 appear to move fairly normally.  IMPRESSION: The patient has a high-grade stenosis at the level of L3-4 with significant amount of degenerative changes at multiple levels in his lumbar spine, particularly above L3-4, where it appears that he has an old healed compression fracture of the L1 vertebra. I have advised Mr. Sadlowski that, ultimately, I believe he needs to consider surgical decompression and stabilization of the L3-4 joint. I discussed with him on a model the way this is done by removing the lamina and then placing screws into the pedicles of L3 and L4 with some spacers in the disc space to hold that space open. This surgery requires about 2-1/2 to 3 hours of time to complete. Afterwards, he would have to wear an external corset for a period of about 6 weeks while he is up and out of bed. He does not have to wear it while he is lying down. Once the healing starts, generally, patients start to progress and feel better sooner. We would check on him periodically after the surgery, and gradually, have him weaned away from any narcotic pain medications. I did note that there are some concerns about the other spondylitic changes that he has in his lumbar spine, but I do not believe that they are actively engaged in creating his current symptom complex. I believe the looseness of the L3-4 joint with the severe degree of stenosis that he has present, that that is likely causing the worst of his symptoms in his lumbar spine. We can plan on scheduling the surgery at his convenience.

## 2016-08-31 ENCOUNTER — Inpatient Hospital Stay (HOSPITAL_COMMUNITY): Payer: Medicare Other

## 2016-08-31 ENCOUNTER — Inpatient Hospital Stay (HOSPITAL_COMMUNITY)
Admission: RE | Admit: 2016-08-31 | Discharge: 2016-09-01 | DRG: 455 | Disposition: A | Discharge status: Home / Self Care | Payer: Medicare Other | Source: Ambulatory Visit | Attending: Neurological Surgery | Admitting: Neurological Surgery

## 2016-08-31 ENCOUNTER — Inpatient Hospital Stay (HOSPITAL_COMMUNITY): Payer: Medicare Other | Admitting: Anesthesiology

## 2016-08-31 ENCOUNTER — Encounter (HOSPITAL_COMMUNITY): Payer: Self-pay | Admitting: Certified Registered Nurse Anesthetist

## 2016-08-31 ENCOUNTER — Inpatient Hospital Stay (HOSPITAL_COMMUNITY)
Admission: RE | Disposition: A | Discharge status: Home / Self Care | Payer: Self-pay | Source: Ambulatory Visit | Attending: Neurological Surgery

## 2016-08-31 DIAGNOSIS — K219 Gastro-esophageal reflux disease without esophagitis: Secondary | ICD-10-CM | POA: Diagnosis not present

## 2016-08-31 DIAGNOSIS — Z8546 Personal history of malignant neoplasm of prostate: Secondary | ICD-10-CM | POA: Diagnosis not present

## 2016-08-31 DIAGNOSIS — M1991 Primary osteoarthritis, unspecified site: Secondary | ICD-10-CM | POA: Diagnosis present

## 2016-08-31 DIAGNOSIS — M4326 Fusion of spine, lumbar region: Secondary | ICD-10-CM | POA: Diagnosis not present

## 2016-08-31 DIAGNOSIS — Z79899 Other long term (current) drug therapy: Secondary | ICD-10-CM | POA: Diagnosis not present

## 2016-08-31 DIAGNOSIS — M4316 Spondylolisthesis, lumbar region: Secondary | ICD-10-CM | POA: Diagnosis present

## 2016-08-31 DIAGNOSIS — M5430 Sciatica, unspecified side: Secondary | ICD-10-CM | POA: Diagnosis not present

## 2016-08-31 DIAGNOSIS — M48061 Spinal stenosis, lumbar region without neurogenic claudication: Principal | ICD-10-CM | POA: Diagnosis present

## 2016-08-31 DIAGNOSIS — Z419 Encounter for procedure for purposes other than remedying health state, unspecified: Secondary | ICD-10-CM

## 2016-08-31 DIAGNOSIS — M5416 Radiculopathy, lumbar region: Secondary | ICD-10-CM | POA: Diagnosis not present

## 2016-08-31 DIAGNOSIS — Z880 Allergy status to penicillin: Secondary | ICD-10-CM | POA: Diagnosis not present

## 2016-08-31 DIAGNOSIS — M542 Cervicalgia: Secondary | ICD-10-CM | POA: Diagnosis not present

## 2016-08-31 DIAGNOSIS — Z9079 Acquired absence of other genital organ(s): Secondary | ICD-10-CM

## 2016-08-31 SURGERY — POSTERIOR LUMBAR FUSION 1 LEVEL
Anesthesia: General | Site: Back

## 2016-08-31 MED ORDER — BUPIVACAINE HCL (PF) 0.5 % IJ SOLN
INTRAMUSCULAR | Status: AC
  Filled 2016-08-31: qty 30

## 2016-08-31 MED ORDER — SODIUM CHLORIDE 0.9% FLUSH
3.0000 mL | Freq: Two times a day (BID) | INTRAVENOUS | Status: DC
  Administered 2016-08-31: 10 mL via INTRAVENOUS

## 2016-08-31 MED ORDER — LIDOCAINE-EPINEPHRINE 2 %-1:100000 IJ SOLN
INTRAMUSCULAR | Status: AC
  Filled 2016-08-31: qty 1

## 2016-08-31 MED ORDER — METHOCARBAMOL 500 MG PO TABS
500.0000 mg | ORAL_TABLET | Freq: Four times a day (QID) | ORAL | Status: DC | PRN
  Administered 2016-08-31 – 2016-09-01 (×2): 500 mg via ORAL
  Filled 2016-08-31 (×2): qty 1

## 2016-08-31 MED ORDER — ALUM & MAG HYDROXIDE-SIMETH 200-200-20 MG/5ML PO SUSP: 30.0000 mL | Freq: Four times a day (QID) | ORAL | Status: DC | PRN

## 2016-08-31 MED ORDER — ACETAMINOPHEN 325 MG PO TABS: 650.0000 mg | ORAL_TABLET | ORAL | Status: DC | PRN

## 2016-08-31 MED ORDER — DOCUSATE SODIUM 100 MG PO CAPS
100.0000 mg | ORAL_CAPSULE | Freq: Two times a day (BID) | ORAL | Status: DC
  Administered 2016-08-31 – 2016-09-01 (×2): 100 mg via ORAL
  Filled 2016-08-31 (×2): qty 1

## 2016-08-31 MED ORDER — DEXAMETHASONE SODIUM PHOSPHATE 10 MG/ML IJ SOLN
INTRAMUSCULAR | Status: DC | PRN
  Administered 2016-08-31: 10 mg via INTRAVENOUS

## 2016-08-31 MED ORDER — SUGAMMADEX SODIUM 200 MG/2ML IV SOLN
INTRAVENOUS | Status: AC
  Filled 2016-08-31: qty 2

## 2016-08-31 MED ORDER — SENNA 8.6 MG PO TABS
1.0000 | ORAL_TABLET | Freq: Two times a day (BID) | ORAL | Status: DC
  Administered 2016-08-31 – 2016-09-01 (×2): 8.6 mg via ORAL
  Filled 2016-08-31 (×2): qty 1

## 2016-08-31 MED ORDER — MIDAZOLAM HCL 2 MG/2ML IJ SOLN
INTRAMUSCULAR | Status: AC
  Filled 2016-08-31: qty 2

## 2016-08-31 MED ORDER — THROMBIN 5000 UNITS EX SOLR
OROMUCOSAL | Status: DC | PRN
  Administered 2016-08-31: 13:00:00 via TOPICAL

## 2016-08-31 MED ORDER — SODIUM CHLORIDE 0.9 % IV SOLN: 250.0000 mL | INTRAVENOUS | Status: DC

## 2016-08-31 MED ORDER — PHENYLEPHRINE 40 MCG/ML (10ML) SYRINGE FOR IV PUSH (FOR BLOOD PRESSURE SUPPORT)
PREFILLED_SYRINGE | INTRAVENOUS | Status: DC | PRN
  Administered 2016-08-31: 80 ug via INTRAVENOUS

## 2016-08-31 MED ORDER — POLYETHYLENE GLYCOL 3350 17 G PO PACK: 17.0000 g | PACK | Freq: Every day | ORAL | Status: DC | PRN

## 2016-08-31 MED ORDER — DEXAMETHASONE 4 MG PO TABS
2.0000 mg | ORAL_TABLET | Freq: Two times a day (BID) | ORAL | Status: DC
  Administered 2016-08-31 – 2016-09-01 (×2): 2 mg via ORAL
  Filled 2016-08-31 (×2): qty 1

## 2016-08-31 MED ORDER — DEXTROSE 5 % IV SOLN
500.0000 mg | Freq: Four times a day (QID) | INTRAVENOUS | Status: DC | PRN
  Filled 2016-08-31: qty 5

## 2016-08-31 MED ORDER — LORATADINE 10 MG PO TABS
10.0000 mg | ORAL_TABLET | Freq: Every day | ORAL | Status: DC
  Administered 2016-09-01: 10 mg via ORAL
  Filled 2016-08-31: qty 1

## 2016-08-31 MED ORDER — SODIUM CHLORIDE 0.9 % IV SOLN: INTRAVENOUS | Status: DC

## 2016-08-31 MED ORDER — BUPIVACAINE HCL (PF) 0.5 % IJ SOLN
INTRAMUSCULAR | Status: DC | PRN
  Administered 2016-08-31: 20 mL
  Administered 2016-08-31: 5 mL

## 2016-08-31 MED ORDER — FENTANYL CITRATE (PF) 250 MCG/5ML IJ SOLN
INTRAMUSCULAR | Status: AC
  Filled 2016-08-31: qty 5

## 2016-08-31 MED ORDER — CYCLOSPORINE 0.05 % OP EMUL
1.0000 [drp] | Freq: Two times a day (BID) | OPHTHALMIC | Status: DC | PRN
  Filled 2016-08-31: qty 1

## 2016-08-31 MED ORDER — LIDOCAINE-EPINEPHRINE 1 %-1:100000 IJ SOLN
INTRAMUSCULAR | Status: DC | PRN
  Administered 2016-08-31: 5 mL

## 2016-08-31 MED ORDER — DIPHENHYDRAMINE HCL 25 MG PO TABS
25.0000 mg | ORAL_TABLET | Freq: Every evening | ORAL | Status: DC | PRN
  Filled 2016-08-31: qty 1

## 2016-08-31 MED ORDER — VANCOMYCIN HCL IN DEXTROSE 1-5 GM/200ML-% IV SOLN
1000.0000 mg | INTRAVENOUS | Status: AC
  Administered 2016-08-31: 1000 mg via INTRAVENOUS

## 2016-08-31 MED ORDER — ONDANSETRON HCL 4 MG PO TABS: 4.0000 mg | ORAL_TABLET | Freq: Four times a day (QID) | ORAL | Status: DC | PRN

## 2016-08-31 MED ORDER — ONDANSETRON HCL 4 MG/2ML IJ SOLN
INTRAMUSCULAR | Status: DC | PRN
  Administered 2016-08-31: 4 mg via INTRAVENOUS

## 2016-08-31 MED ORDER — HYDROMORPHONE HCL 1 MG/ML IJ SOLN
0.2500 mg | INTRAMUSCULAR | Status: DC | PRN
  Administered 2016-08-31 (×2): 0.5 mg via INTRAVENOUS

## 2016-08-31 MED ORDER — PROPOFOL 10 MG/ML IV BOLUS
INTRAVENOUS | Status: AC
  Filled 2016-08-31: qty 20

## 2016-08-31 MED ORDER — BACITRACIN 50000 UNITS IM SOLR
INTRAMUSCULAR | Status: DC | PRN
  Administered 2016-08-31: 12:00:00

## 2016-08-31 MED ORDER — THROMBIN 5000 UNITS EX SOLR
CUTANEOUS | Status: AC
  Filled 2016-08-31: qty 5000

## 2016-08-31 MED ORDER — OMEPRAZOLE 20 MG PO CPDR
20.0000 mg | DELAYED_RELEASE_CAPSULE | Freq: Every day | ORAL | Status: DC
  Filled 2016-08-31: qty 1

## 2016-08-31 MED ORDER — ACETAMINOPHEN 650 MG RE SUPP: 650.0000 mg | RECTAL | Status: DC | PRN

## 2016-08-31 MED ORDER — HYDROMORPHONE HCL 1 MG/ML IJ SOLN
INTRAMUSCULAR | Status: AC
  Filled 2016-08-31: qty 0.5

## 2016-08-31 MED ORDER — ONDANSETRON HCL 4 MG/2ML IJ SOLN
INTRAMUSCULAR | Status: AC
  Filled 2016-08-31: qty 2

## 2016-08-31 MED ORDER — SODIUM CHLORIDE 0.9% FLUSH: 3.0000 mL | INTRAVENOUS | Status: DC | PRN

## 2016-08-31 MED ORDER — DEXAMETHASONE SODIUM PHOSPHATE 4 MG/ML IJ SOLN: 2.0000 mg | Freq: Two times a day (BID) | INTRAMUSCULAR | Status: DC

## 2016-08-31 MED ORDER — PROPOFOL 10 MG/ML IV BOLUS
INTRAVENOUS | Status: DC | PRN
  Administered 2016-08-31: 180 mg via INTRAVENOUS

## 2016-08-31 MED ORDER — ONDANSETRON HCL 4 MG/2ML IJ SOLN: 4.0000 mg | Freq: Once | INTRAMUSCULAR | Status: DC | PRN

## 2016-08-31 MED ORDER — VANCOMYCIN HCL IN DEXTROSE 1-5 GM/200ML-% IV SOLN
INTRAVENOUS | Status: AC
  Filled 2016-08-31: qty 200

## 2016-08-31 MED ORDER — 0.9 % SODIUM CHLORIDE (POUR BTL) OPTIME
TOPICAL | Status: DC | PRN
  Administered 2016-08-31: 1000 mL

## 2016-08-31 MED ORDER — HYDROCODONE-ACETAMINOPHEN 5-325 MG PO TABS
ORAL_TABLET | ORAL | Status: AC
  Administered 2016-08-31: 2
  Filled 2016-08-31: qty 2

## 2016-08-31 MED ORDER — THROMBIN 20000 UNITS EX SOLR
CUTANEOUS | Status: AC
  Filled 2016-08-31: qty 20000

## 2016-08-31 MED ORDER — LACTATED RINGERS IV SOLN
INTRAVENOUS | Status: DC
  Administered 2016-08-31 (×2): via INTRAVENOUS

## 2016-08-31 MED ORDER — LIDOCAINE 2% (20 MG/ML) 5 ML SYRINGE
INTRAMUSCULAR | Status: DC | PRN
  Administered 2016-08-31: 80 mg via INTRAVENOUS

## 2016-08-31 MED ORDER — MENTHOL 3 MG MT LOZG: 1.0000 | LOZENGE | OROMUCOSAL | Status: DC | PRN

## 2016-08-31 MED ORDER — FENTANYL CITRATE (PF) 100 MCG/2ML IJ SOLN
INTRAMUSCULAR | Status: DC | PRN
  Administered 2016-08-31: 100 ug via INTRAVENOUS
  Administered 2016-08-31: 50 ug via INTRAVENOUS

## 2016-08-31 MED ORDER — FLEET ENEMA 7-19 GM/118ML RE ENEM: 1.0000 | ENEMA | Freq: Once | RECTAL | Status: DC | PRN

## 2016-08-31 MED ORDER — PHENOL 1.4 % MT LIQD: 1.0000 | OROMUCOSAL | Status: DC | PRN

## 2016-08-31 MED ORDER — MEPERIDINE HCL 25 MG/ML IJ SOLN: 6.2500 mg | INTRAMUSCULAR | Status: DC | PRN

## 2016-08-31 MED ORDER — HYDROCODONE-ACETAMINOPHEN 5-325 MG PO TABS
1.0000 | ORAL_TABLET | ORAL | Status: DC | PRN
  Administered 2016-08-31 – 2016-09-01 (×4): 2 via ORAL
  Filled 2016-08-31 (×4): qty 2

## 2016-08-31 MED ORDER — VANCOMYCIN HCL IN DEXTROSE 1-5 GM/200ML-% IV SOLN
1000.0000 mg | Freq: Once | INTRAVENOUS | Status: AC
  Administered 2016-08-31: 1000 mg via INTRAVENOUS
  Filled 2016-08-31: qty 200

## 2016-08-31 MED ORDER — THROMBIN 20000 UNITS EX SOLR
CUTANEOUS | Status: DC | PRN
  Administered 2016-08-31: 13:00:00 via TOPICAL

## 2016-08-31 MED ORDER — PHENYLEPHRINE HCL 10 MG/ML IJ SOLN
INTRAVENOUS | Status: DC | PRN
  Administered 2016-08-31: 40 ug/min via INTRAVENOUS

## 2016-08-31 MED ORDER — MIDAZOLAM HCL 2 MG/2ML IJ SOLN
INTRAMUSCULAR | Status: DC | PRN
  Administered 2016-08-31: 2 mg via INTRAVENOUS

## 2016-08-31 MED ORDER — BISACODYL 10 MG RE SUPP: 10.0000 mg | Freq: Every day | RECTAL | Status: DC | PRN

## 2016-08-31 MED ORDER — ROCURONIUM BROMIDE 50 MG/5ML IV SOSY
PREFILLED_SYRINGE | INTRAVENOUS | Status: AC
  Filled 2016-08-31: qty 5

## 2016-08-31 MED ORDER — HYDROMORPHONE HCL 1 MG/ML IJ SOLN
1.0000 mg | INTRAMUSCULAR | Status: DC | PRN
  Administered 2016-08-31: 1 mg via INTRAVENOUS
  Filled 2016-08-31: qty 1

## 2016-08-31 MED ORDER — SUGAMMADEX SODIUM 200 MG/2ML IV SOLN
INTRAVENOUS | Status: DC | PRN
  Administered 2016-08-31: 200 mg via INTRAVENOUS

## 2016-08-31 MED ORDER — ONDANSETRON HCL 4 MG/2ML IJ SOLN: 4.0000 mg | Freq: Four times a day (QID) | INTRAMUSCULAR | Status: DC | PRN

## 2016-08-31 MED ORDER — ROCURONIUM BROMIDE 10 MG/ML (PF) SYRINGE
PREFILLED_SYRINGE | INTRAVENOUS | Status: DC | PRN
  Administered 2016-08-31: 20 mg via INTRAVENOUS
  Administered 2016-08-31: 50 mg via INTRAVENOUS
  Administered 2016-08-31 (×2): 20 mg via INTRAVENOUS
  Administered 2016-08-31: 10 mg via INTRAVENOUS

## 2016-08-31 MED ORDER — ROSUVASTATIN CALCIUM 20 MG PO TABS
20.0000 mg | ORAL_TABLET | ORAL | Status: DC
  Administered 2016-08-31: 20 mg via ORAL
  Filled 2016-08-31: qty 1

## 2016-08-31 SURGICAL SUPPLY — 59 items
BAG DECANTER FOR FLEXI CONT (MISCELLANEOUS) ×3 IMPLANT
BASKET BONE COLLECTION (BASKET) ×2 IMPLANT
BONE EQUIVA 10CC (Bone Implant) ×3 IMPLANT
BUR MATCHSTICK NEURO 3.0 LAGG (BURR) ×3 IMPLANT
CANISTER SUCT 3000ML PPV (MISCELLANEOUS) ×3 IMPLANT
CARTRIDGE OIL MAESTRO DRILL (MISCELLANEOUS) ×1 IMPLANT
CONT SPEC 4OZ CLIKSEAL STRL BL (MISCELLANEOUS) ×3 IMPLANT
COVER BACK TABLE 60X90IN (DRAPES) ×3 IMPLANT
DERMABOND ADVANCED (GAUZE/BANDAGES/DRESSINGS) ×2
DERMABOND ADVANCED .7 DNX12 (GAUZE/BANDAGES/DRESSINGS) ×1 IMPLANT
DEVICE DISSECT PLASMABLAD 3.0S (MISCELLANEOUS) ×1 IMPLANT
DIFFUSER DRILL AIR PNEUMATIC (MISCELLANEOUS) ×3 IMPLANT
DRAPE C-ARM 42X72 X-RAY (DRAPES) ×6 IMPLANT
DRAPE HALF SHEET 40X57 (DRAPES) IMPLANT
DRAPE LAPAROTOMY 100X72X124 (DRAPES) ×3 IMPLANT
DRAPE POUCH INSTRU U-SHP 10X18 (DRAPES) ×3 IMPLANT
DRSG OPSITE POSTOP 4X6 (GAUZE/BANDAGES/DRESSINGS) ×3 IMPLANT
DURAPREP 26ML APPLICATOR (WOUND CARE) ×3 IMPLANT
DURASEAL APPLICATOR TIP (TIP) IMPLANT
DURASEAL SPINE SEALANT 3ML (MISCELLANEOUS) IMPLANT
ELECT REM PT RETURN 9FT ADLT (ELECTROSURGICAL) ×3
ELECTRODE REM PT RTRN 9FT ADLT (ELECTROSURGICAL) ×1 IMPLANT
GAUZE SPONGE 4X4 12PLY STRL (GAUZE/BANDAGES/DRESSINGS) ×3 IMPLANT
GAUZE SPONGE 4X4 16PLY XRAY LF (GAUZE/BANDAGES/DRESSINGS) ×3 IMPLANT
GLOVE BIOGEL PI IND STRL 8.5 (GLOVE) ×2 IMPLANT
GLOVE BIOGEL PI INDICATOR 8.5 (GLOVE) ×4
GLOVE ECLIPSE 8.5 STRL (GLOVE) ×6 IMPLANT
GOWN STRL REUS W/ TWL LRG LVL3 (GOWN DISPOSABLE) ×1 IMPLANT
GOWN STRL REUS W/ TWL XL LVL3 (GOWN DISPOSABLE) ×1 IMPLANT
GOWN STRL REUS W/TWL 2XL LVL3 (GOWN DISPOSABLE) ×12 IMPLANT
GOWN STRL REUS W/TWL LRG LVL3 (GOWN DISPOSABLE) ×2
GOWN STRL REUS W/TWL XL LVL3 (GOWN DISPOSABLE) ×2
HEMOSTAT POWDER KIT SURGIFOAM (HEMOSTASIS) ×3 IMPLANT
KIT BASIN OR (CUSTOM PROCEDURE TRAY) ×3 IMPLANT
KIT ROOM TURNOVER OR (KITS) ×3 IMPLANT
MILL MEDIUM DISP (BLADE) ×3 IMPLANT
NEEDLE HYPO 22GX1.5 SAFETY (NEEDLE) ×3 IMPLANT
NS IRRIG 1000ML POUR BTL (IV SOLUTION) ×3 IMPLANT
OIL CARTRIDGE MAESTRO DRILL (MISCELLANEOUS) ×3
PACK LAMINECTOMY NEURO (CUSTOM PROCEDURE TRAY) ×3 IMPLANT
PAD ARMBOARD 7.5X6 YLW CONV (MISCELLANEOUS) ×9 IMPLANT
PATTIES SURGICAL .5 X1 (DISPOSABLE) ×3 IMPLANT
PLASMABLADE 3.0S (MISCELLANEOUS) ×3
ROD TI ALLOY CVD VIT 5.5X35MM (Rod) ×6 IMPLANT
SCREW VITALITY PA 6.5X45MM (Screw) ×12 IMPLANT
SPACER ZYSTON STR 11X25X10X8 (Spacer) ×6 IMPLANT
SPONGE LAP 4X18 X RAY DECT (DISPOSABLE) IMPLANT
SPONGE SURGIFOAM ABS GEL 100 (HEMOSTASIS) ×3 IMPLANT
SUT PROLENE 6 0 BV (SUTURE) IMPLANT
SUT VIC AB 1 CT1 18XBRD ANBCTR (SUTURE) ×2 IMPLANT
SUT VIC AB 1 CT1 8-18 (SUTURE) ×4
SUT VIC AB 2-0 CP2 18 (SUTURE) ×6 IMPLANT
SUT VIC AB 3-0 SH 8-18 (SUTURE) ×6 IMPLANT
SYR 3ML LL SCALE MARK (SYRINGE) ×12 IMPLANT
TOP CLOSURE 5.5-6.0 SHEAR TOP (Neuro Prosthesis/Implant) ×12 IMPLANT
TOWEL GREEN STERILE (TOWEL DISPOSABLE) ×2 IMPLANT
TOWEL GREEN STERILE FF (TOWEL DISPOSABLE) ×3 IMPLANT
TRAY FOLEY W/METER SILVER 16FR (SET/KITS/TRAYS/PACK) ×3 IMPLANT
WATER STERILE IRR 1000ML POUR (IV SOLUTION) ×3 IMPLANT

## 2016-08-31 NOTE — Anesthesia Postprocedure Evaluation (Signed)
Anesthesia Post Note  Patient: Terry [name]  Procedure(s) Performed: Procedure(s) (LRB): Lumbar three- four Posterior lumbar interbody fusion (N/A)  Patient location during evaluation: PACU Anesthesia Type: General Level of consciousness: awake and alert Pain management: pain level controlled Vital Signs Assessment: post-procedure vital signs reviewed and stable Respiratory status: spontaneous breathing Cardiovascular status: stable Postop Assessment: no signs of nausea or vomiting Anesthetic complications: no       Last Vitals:  Vitals:   08/31/16 0935 08/31/16 1442  BP: (!) 146/82 (!) 147/85  Pulse: 71 76  Resp: 20 16  Temp: 36.9 C     Last Pain:  Vitals:   08/31/16 0935  TempSrc: Oral                 Jolly Mango

## 2016-08-31 NOTE — Progress Notes (Signed)
Patient ID: Terry [name], male   DOB: 1951-02-10, 66 y.o.   MRN: [id number] Vital signs stable Motor function is good Dressing is dry Feels comfortable

## 2016-08-31 NOTE — Anesthesia Preprocedure Evaluation (Addendum)
Anesthesia Evaluation    Reviewed: Allergy & Precautions, NPO status , Patient's Chart, lab work & pertinent test results  Airway Mallampati: II  TM Distance: >3 FB Neck ROM: Full    Dental  (+) Teeth Intact, Dental Advisory Given   Pulmonary asthma , pneumonia, resolved,    breath sounds clear to auscultation       Cardiovascular  Rhythm:Regular Rate:Normal     Neuro/Psych    GI/Hepatic GERD  Medicated and Controlled,  Endo/Other    Renal/GU      Musculoskeletal  (+) Arthritis ,   Abdominal   Peds  Hematology   Anesthesia Other Findings   Reproductive/Obstetrics                          Anesthesia Physical Anesthesia Plan  ASA: II  Anesthesia Plan: General   Post-op Pain Management:    Induction: Intravenous  Airway Management Planned: Oral ETT  Additional Equipment:   Intra-op Plan:   Post-operative Plan:   Informed Consent: I have reviewed the patients History and Physical, chart, labs and discussed the procedure including the risks, benefits and alternatives for the proposed anesthesia with the patient or authorized representative who has indicated his/her understanding and acceptance.   Dental advisory given  Plan Discussed with: CRNA and Anesthesiologist  Anesthesia Plan Comments: (Patient has adequate extension of the neck s/p cervical fusion, some residual numbness in right 4th and 5th fingers.)      Anesthesia Quick Evaluation

## 2016-08-31 NOTE — Op Note (Signed)
Date of surgery: 08/31/2016 Preoperative diagnosis: L3-L4 spondylolisthesis with stenosis and lumbar radiculopathy Postoperative diagnosis: Same Procedure: L3-4 decompressive laminectomy decompression of L3 and L4 nerve roots, posterior lumbar interbody arthrodesis with peek spacers local autograft and allograft, pedicle screw fixation L3-4, posterior lateral arthrodesis L3-4  Surgeon: Kristeen Miss M.D.  Asst.: Jovita Gamma M.D.  Indications: Patient is Terry [name] is a 66 y.o. male who who's had significant back pain and lumbar radiculopathy for over a years period time. A lumbar MRI demonstrates advanced spondylolisthesis with high-grade canal stenosis. he was advised regarding surgical intervention.  Procedure: The patient was brought to the operating room supine on a stretcher. After the smooth induction of general endotracheal anesthesia she was turned prone and the back was prepped with alcohol and DuraPrep. The back was then draped sterilely. A midline incision was created and carried down to the lumbar dorsal fascia. A localizing radiograph identified the L3 and L4 spinous processes. A subligamentous dissection was created at L3 and L4 to expose the interlaminar space at L3 and L4 and the facet joints over the L3-4 interspace. Laminotomies were were then created removing the entire inferior margin of the lamina of L4 including the inferior facet at the L3-4 joint. The yellow ligament was taken up and the common dural tube was exposed along with the L3 nerve root superiorly, and the L4 nerve root inferiorly, the disc space was exposed and epidural veins in this region were cauterized and divided. The L3 nerve roots and the L4 nerve root were dissected with care taken to protect them. The disc space was opened and a combination of curettes and rongeurs was used to evacuate the disc space fully. The endplates were removed using sharp curettes. An interbody spacer was placed to distract the disc  space while the contralateral discectomy was performed. When the entirety of the disc was removed and the endplates were prepared final sizing of the disc space was obtained 11 mm 8 lordotic 25 mm long peek spacers were chosen and packed with autograft and allograft and placed into the interspace. The remainder of the interspace was packed with autograft and allograft for a total volume of 9 mL. Pedicle entry sites were then chosen using fluoroscopic guidance and 6.5 x 45 mm screws were placed in L3 and 6.5 x 45 mm screws were placed in L4. The lateral gutters were decorticated and graft was packed in the posterolateral gutters between L4 and L3. Final radiographs were obtained after placing appropriately sized rods between the pedicle screws at L3-4 and torquing these to the appropriate tension. The surgical site was inspected carefully to assure the L4 and L3 nerve roots were well decompressed, hemostasis was obtained, and the graft was well packed. Then the retractors were removed and the wound was closed with #1 Vicryl in the lumbar dorsal fascia 2-0 Vicryl in the subcutaneous tissue and 3-0 Vicryl subcuticularly. When he cc of half percent Marcaine was injected into the paraspinous musculature at the time of closure. Blood loss was estimated at 250 cc. The patient tolerated procedure well and was returned to the recovery room in stable condition.

## 2016-08-31 NOTE — Transfer of Care (Signed)
Immediate Anesthesia Transfer of Care Note  Patient: Terry [name]  Procedure(s) Performed: Procedure(s) with comments: Lumbar three- four Posterior lumbar interbody fusion (N/A) - L3-4 Posterior lumbar interbody fusion  Patient Location: PACU  Anesthesia Type:General  Level of Consciousness: awake, alert , oriented and patient cooperative  Airway & Oxygen Therapy: Patient Spontanous Breathing and Patient connected to nasal cannula oxygen  Post-op Assessment: Report given to RN, Post -op Vital signs reviewed and stable and Patient moving all extremities X 4  Post vital signs: Reviewed and stable  Last Vitals:  Vitals:   08/31/16 0935  BP: (!) 146/82  Pulse: 71  Resp: 20  Temp: 36.9 C    Last Pain:  Vitals:   08/31/16 0935  TempSrc: Oral         Complications: No apparent anesthesia complications

## 2016-08-31 NOTE — Anesthesia Procedure Notes (Signed)
Procedure Name: Intubation Date/Time: 08/31/2016 11:47 AM Performed by: Julieta Bellini Pre-anesthesia Checklist: Patient identified, Emergency Drugs available, Suction available and Patient being monitored Patient Re-evaluated:Patient Re-evaluated prior to inductionOxygen Delivery Method: Circle system utilized Preoxygenation: Pre-oxygenation with 100% oxygen Intubation Type: IV induction Ventilation: Mask ventilation without difficulty and Oral airway inserted - appropriate to patient size Laryngoscope Size: Mac and 3 Grade View: Grade I Tube type: Oral Tube size: 7.5 mm Number of attempts: 1 Airway Equipment and Method: Stylet Placement Confirmation: ETT inserted through vocal cords under direct vision,  positive ETCO2 and breath sounds checked- equal and bilateral Secured at: 23 cm Tube secured with: Tape Dental Injury: Teeth and Oropharynx as per pre-operative assessment

## 2016-08-31 NOTE — Progress Notes (Signed)
Pharmacy Antibiotic Note  Terry [name] is a 66 y.o. male admitted on 08/31/2016 with back pain and sciatica.  Pharmacy has been consulted for vancomycin dosing for surgical prophylaxis.  No documentation of a drain and confirmed with RN.  Noted that patient received vancomycin 1gm IV around 1200 today.  Baseline labs reviewed.   Plan: - Vanc 1gm IV x 1 tonight - Pharmacy will sign off.  Thank you for the consult!   Weight: 181 lb (82.1 kg)  Temp (24hrs), Avg:98.4 F (36.9 C), Min:98.1 F (36.7 C), Max:98.7 F (37.1 C)   Recent Labs Lab 08/25/16 0948  WBC 6.5  CREATININE 1.12    Estimated Creatinine Clearance: 66.9 mL/min (by C-G formula based on SCr of 1.12 mg/dL).    Allergies  Allergen Reactions  . Penicillins Hives and Other (See Comments)    Has patient had a PCN reaction causing immediate rash, facial/tongue/throat swelling, SOB or lightheadedness with hypotension: No Has patient had a PCN reaction causing SEVERE RASH INVOLVING MUCUS MEMBRANES or SKIN NECROSIS: #  #  #  YES  #  #  #  Has patient had a PCN reaction that required hospitalization No Has patient had a PCN reaction occurring within the last 10 years: No      Audris Speaker D. Mina Marble, PharmD, BCPS Pager:  [phone] 08/31/2016, 5:14 PM

## 2016-09-01 LAB — CBC
HCT: 40.2 % (ref 39.0–52.0)
Hemoglobin: 14 g/dL (ref 13.0–17.0)
MCH: 29.2 pg (ref 26.0–34.0)
MCHC: 34.8 g/dL (ref 30.0–36.0)
MCV: 83.8 fL (ref 78.0–100.0)
PLATELETS: 218 10*3/uL (ref 150–400)
RBC: 4.8 MIL/uL (ref 4.22–5.81)
RDW: 13.1 % (ref 11.5–15.5)
WBC: 17.1 10*3/uL — AB (ref 4.0–10.5)

## 2016-09-01 LAB — BASIC METABOLIC PANEL
ANION GAP: 12 (ref 5–15)
BUN: 16 mg/dL (ref 6–20)
CALCIUM: 8.7 mg/dL — AB (ref 8.9–10.3)
CO2: 22 mmol/L (ref 22–32)
Chloride: 100 mmol/L — ABNORMAL LOW (ref 101–111)
Creatinine, Ser: 1.18 mg/dL (ref 0.61–1.24)
GLUCOSE: 189 mg/dL — AB (ref 65–99)
Potassium: 4.2 mmol/L (ref 3.5–5.1)
SODIUM: 134 mmol/L — AB (ref 135–145)

## 2016-09-01 MED ORDER — METHOCARBAMOL 500 MG PO TABS: 500.0000 mg | ORAL_TABLET | Freq: Four times a day (QID) | ORAL | 3 refills | Status: DC | PRN

## 2016-09-01 MED ORDER — DEXAMETHASONE 1 MG PO TABS: ORAL_TABLET | ORAL | 0 refills | Status: DC

## 2016-09-01 MED ORDER — OMEPRAZOLE 20 MG PO CPDR
20.0000 mg | DELAYED_RELEASE_CAPSULE | Freq: Every day | ORAL | Status: DC
  Administered 2016-09-01: 20 mg via ORAL
  Filled 2016-09-01: qty 1

## 2016-09-01 MED ORDER — HYDROCODONE-ACETAMINOPHEN 5-325 MG PO TABS: 1.0000 | ORAL_TABLET | ORAL | 0 refills | Status: DC | PRN

## 2016-09-01 MED FILL — Sodium Chloride IV Soln 0.9%: INTRAVENOUS | Qty: 1000 | Status: AC

## 2016-09-01 MED FILL — Heparin Sodium (Porcine) Inj 1000 Unit/ML: INTRAMUSCULAR | Qty: 30 | Status: AC

## 2016-09-01 NOTE — Progress Notes (Signed)
Pt doing well. Pt given D/C instructions with Rx's, verbal understanding was provided. Pt's incision is clean and dry with no sign of infection, honeycomb dressing was removed prior to D/C per MD order. Pt's IV was removed prior to D/C. Pt D/C'd home via walking @ 1045 per MD order. Pt is stable @ D/C and has no other needs at this time. Holli Humbles, RN

## 2016-09-01 NOTE — Evaluation (Signed)
Occupational Therapy Evaluation Patient Details Name: Terry [name] MRN: [id number] DOB: 1950/10/26 Today's Date: 09/01/2016    History of Present Illness Pt is a 65 y/o male who presents s/p L3-L4 PLIF on 08/31/16. PMH significant for CA, chronic back pain, ACDF 2016, B knee arthroscopy.   Clinical Impression   Patient evaluated by Occupational Therapy with no further acute OT needs identified. All education has been completed and the patient has no further questions. See below for any follow-up Occupational Therapy or equipment needs. OT to sign off. Thank you for referral.      Follow Up Recommendations  No OT follow up    Equipment Recommendations  None recommended by OT    Recommendations for Other Services       Precautions / Restrictions Precautions Precautions: Fall;Back Precaution Booklet Issued: Yes (comment) Precaution Comments: reviewed back handout in detail due to unable to recall any precautions from PT session Required Braces or Orthoses: Spinal Brace Spinal Brace: Lumbar corset;Applied in sitting position Restrictions Weight Bearing Restrictions: No      Mobility Bed Mobility Overal bed mobility: Modified Independent             General bed mobility comments: up in hall on arrival to unit and then in chair after PT session for the start of OT evaluation  Transfers Overall transfer level: Modified independent Equipment used: None             General transfer comment: VC's for maintaining precautions. Pt performed sit<>stand several times throughout session.     Balance Overall balance assessment: No apparent balance deficits (not formally assessed)                                         ADL either performed or assessed with clinical judgement   ADL Overall ADL's : Needs assistance/impaired Eating/Feeding: Independent   Grooming: Wash/dry hands   Upper Body Bathing: Independent   Lower Body Bathing: Supervison/  safety Lower Body Bathing Details (indicate cue type and reason): educated on cross bil LE    Upper Body Dressing Details (indicate cue type and reason): educated on correct don and doff of brace. pt asking about sitting up in the bed to watch TV and advised to take off brace if feeling fatigued to avoid falling asleep in brace     Toilet Transfer: Supervision/safety           Functional mobility during ADLs: Supervision/safety General ADL Comments: pt needed teach back to recall precautions. pt unable to recall 1 out 3 ( twisting ) at the conclusion of session. pt advised to read precautions again. Pt reports "i think these drugs are having an affect on me" Pt advised by OT to have family (A) with medication management and safety with adls for first 2 days.  Pt educated on bathing and avoid washing directly on incision. Pt educated to use new wash cloth and towel each day. Pt educated to allow water to run across dressing and not to soak in a tub at this time. Pt advised RN will instruct on any bandages required otherwise is open to air.   Back handout provided and reviewed adls in detail. Pt educated on: clothing between brace, never sleep in brace, set an alarm at night for medication, avoid sitting for long periods of time, correct bed positioning for sleeping, correct sequence for bed mobility, avoiding lifting  more than 5 pounds and never wash directly over incision. All education is complete and patient indicates understanding.   Advised on animal to be provided by wife and daughter at this time.  Pt with poor return demo of precautions due to poor recall of precautions. Pt finding therapist in the hall after session to repeat them back to therapist showing the use of teach back again. Pt likes to joke and needed redirection initially in session.    Vision Baseline Vision/History: Wears glasses Wears Glasses: At all times       Perception     Praxis      Pertinent Vitals/Pain  Pain Assessment: Faces Faces Pain Scale: Hurts a little bit Pain Location: Incision site Pain Descriptors / Indicators: Operative site guarding;Discomfort Pain Intervention(s): Limited activity within patient's tolerance;Monitored during session;Repositioned     Hand Dominance Right   Extremity/Trunk Assessment Upper Extremity Assessment Upper Extremity Assessment: Overall WFL for tasks assessed   Lower Extremity Assessment Lower Extremity Assessment: Defer to PT evaluation LLE Deficits / Details: Pt reports Achilles tendon injury that recently happened and has been going to outpatient PT for it.    Cervical / Trunk Assessment Cervical / Trunk Assessment: Normal;Other exceptions Cervical / Trunk Exceptions: s/p surgery   Communication Communication Communication: No difficulties   Cognition Arousal/Alertness: Awake/alert Behavior During Therapy: WFL for tasks assessed/performed Overall Cognitive Status: Within Functional Limits for tasks assessed                                     General Comments  educated on bathing with incision    Exercises     Shoulder Instructions      Home Living Family/patient expects to be discharged to:: Private residence Living Arrangements: Spouse/significant other Available Help at Discharge: Family;Available 24 hours/day Type of Home: House Home Access: Stairs to enter [employer] of Steps: 5   Home Layout: Multi-level;Able to live on main level with bedroom/bathroom Alternate Level Stairs-Number of Steps: Flight   Bathroom Shower/Tub: Tub/shower unit;Walk-in Psychologist, prison and probation services: Standard     Home Equipment: Research scientist (life sciences);Shower seat - built in [employer]: Reacher Additional Comments: x2 dogs in the home and must go to the lowest level to let the dog in and out. Wife ./ daughter will (A) with this task especially for first 2 weeks. uses a chair and half , sofa and recliner that will lay  flat in the den at home for seating options      Prior Functioning/Environment Level of Independence: Independent                 OT Problem List:        OT Treatment/Interventions:      OT Goals(Current goals can be found in the care plan section) Acute Rehab OT Goals Patient Stated Goal: Home today  OT Frequency:     Barriers to D/C:            Co-evaluation              End of Session Equipment Utilized During Treatment: Back brace Nurse Communication: Mobility status;Precautions  Activity Tolerance: Patient tolerated treatment well Patient left: in chair;with call bell/phone within reach  OT Visit Diagnosis: Unsteadiness on feet (R26.81)                Time: 4627-0350 OT Time Calculation (min): 16 min Charges:  OT General  Charges $OT Visit: 1 Procedure OT Evaluation $OT Eval Moderate Complexity: 1 Procedure G-Codes:      Jeri Modena   OTR/L Pager: [phone] Office: [phone] .   Parke Poisson B 09/01/2016, 9:44 AM

## 2016-09-01 NOTE — Evaluation (Signed)
Physical Therapy Evaluation Patient Details Name: Terry [name] MRN: [id number] DOB: 03-Feb-1951 Today's Date: 09/01/2016   History of Present Illness  Pt is a 66 y/o male who presents s/p L3-L4 PLIF on 08/31/16. PMH significant for CA, chronic back pain, ACDF 2016, B knee arthroscopy.  Clinical Impression  Patient evaluated by Physical Therapy with no further acute PT needs identified. All education has been completed and the patient has no further questions. At the time of PT eval pt was able to perform transfers and ambulation with gross modified independence. Pt was educated on walking program, car transfer, and safety with general mobility and back precautions. See below for any follow-up Physical Therapy or equipment needs. PT is signing off. Thank you for this referral.     Follow Up Recommendations Outpatient PT (When appropriate per post-op protocol)    Equipment Recommendations  None recommended by PT    Recommendations for Other Services       Precautions / Restrictions Precautions Precautions: Fall;Back Precaution Booklet Issued: Yes (comment) Precaution Comments: Discussed handout with pt in detail. Pt was cued for precautions during functional mobility.  Required Braces or Orthoses: Spinal Brace Spinal Brace: Lumbar corset;Applied in sitting position Restrictions Weight Bearing Restrictions: No      Mobility  Bed Mobility Overal bed mobility: Modified Independent             General bed mobility comments: VC's for proper log roll technique.   Transfers Overall transfer level: Modified independent Equipment used: None             General transfer comment: VC's for maintaining precautions. Pt performed sit<>stand several times throughout session.   Ambulation/Gait Ambulation/Gait assistance: Modified independent (Device/Increase time) Ambulation Distance (Feet): 300 Feet Assistive device: None Gait Pattern/deviations: WFL(Within Functional Limits)    Gait velocity interpretation: Below normal speed for age/gender General Gait Details: Slightly decreased gait speed however no unsteadiness or LOB noted.   Stairs Stairs: Yes Stairs assistance: Modified independent (Device/Increase time);Supervision Stair Management: One rail Right;Alternating pattern;Step to pattern;Forwards Number of Stairs: 11 General stair comments: Mod I ascending stairs (alternating pattern), and supervision provided for descending stairs (step-to pattern) due to Achilles tendon injury.   Wheelchair Mobility    Modified Rankin (Stroke Patients Only)       Balance Overall balance assessment: No apparent balance deficits (not formally assessed)                                           Pertinent Vitals/Pain Pain Assessment: Faces Faces Pain Scale: Hurts a little bit Pain Location: Incision site Pain Descriptors / Indicators: Operative site guarding;Discomfort Pain Intervention(s): Limited activity within patient's tolerance;Monitored during session;Repositioned    Home Living Family/patient expects to be discharged to:: Private residence Living Arrangements: Spouse/significant other Available Help at Discharge: Family;Available 24 hours/day Type of Home: House Home Access: Stairs to enter   [employer] of Steps: 5 Home Layout: Multi-level;Able to live on main level with bedroom/bathroom Home Equipment: Adaptive equipment;Shower seat - built in      Prior Function Level of Independence: Independent               Hand Dominance   Dominant Hand: Right    Extremity/Trunk Assessment   Upper Extremity Assessment Upper Extremity Assessment: Defer to OT evaluation    Lower Extremity Assessment Lower Extremity Assessment: LLE deficits/detail LLE Deficits /  Details: Pt reports Achilles tendon injury that recently happened and has been going to outpatient PT for it.     Cervical / Trunk Assessment Cervical /  Trunk Assessment: Normal;Other exceptions Cervical / Trunk Exceptions: s/p surgery  Communication   Communication: No difficulties  Cognition Arousal/Alertness: Awake/alert Behavior During Therapy: WFL for tasks assessed/performed Overall Cognitive Status: Within Functional Limits for tasks assessed                                        General Comments      Exercises     Assessment/Plan    PT Assessment Patent does not need any further PT services  PT Problem List         PT Treatment Interventions      PT Goals (Current goals can be found in the Care Plan section)  Acute Rehab PT Goals Patient Stated Goal: Home today PT Goal Formulation: All assessment and education complete, DC therapy    Frequency     Barriers to discharge        Co-evaluation               End of Session Equipment Utilized During Treatment: Back brace Activity Tolerance: Patient tolerated treatment well Patient left: in chair;with call bell/phone within reach Nurse Communication: Mobility status PT Visit Diagnosis: Pain;Other symptoms and signs involving the nervous system (R29.898) Pain - part of body:  (Back)    Time: 8182-9937 PT Time Calculation (min) (ACUTE ONLY): 18 min   Charges:   PT Evaluation $PT Eval Moderate Complexity: 1 Procedure     PT G Codes:        Rolinda Roan, PT, DPT Acute Rehabilitation Services Pager: [phone]   Thelma Comp 09/01/2016, 8:37 AM

## 2016-09-01 NOTE — Discharge Instructions (Signed)

## 2016-09-01 NOTE — Discharge Summary (Signed)
Physician Discharge Summary  Patient ID: Terry [name] MRN: [id number] DOB/AGE: 68-Feb-1952 68 y.o.  Admit date: 08/31/2016 Discharge date: 09/01/2016  Admission Diagnoses:L3-L4 spondylolisthesis with stenosis and lumbar radiculopathy, neurogenic claudication  Discharge Diagnoses: L3-L4 spondylolisthesis with stenosis and lumbar radiculopathy, neurogenic claudication Active Problems:   Spondylolisthesis of lumbar region   Discharged Condition: good  Hospital Course: Patient was admitted to undergo surgical decompression and stabilization at L3-L4 which she tolerated well.  Consults: None  Significant Diagnostic Studies: None  Treatments: surgery: Decompression L3-L4 with posterior lumbar interbody arthrodesis using peek spacers local autograft and allograft pedicle screw fixation L3-L4.  Discharge Exam: Blood pressure (!) 142/94, pulse 91, temperature 98.4 F (36.9 C), temperature source Oral, resp. rate 20, weight 82.1 kg (181 lb), SpO2 95 %. Incision is clean and dry, station and gait are intact.  Disposition: 01-Home or Self Care  Discharge Instructions    Call MD for:  redness, tenderness, or signs of infection (pain, swelling, redness, odor or green/yellow discharge around incision site)    Complete by:  As directed    Call MD for:  severe uncontrolled pain    Complete by:  As directed    Call MD for:  temperature >100.4    Complete by:  As directed    Diet - low sodium heart healthy    Complete by:  As directed    Discharge instructions    Complete by:  As directed    Okay to shower. Do not apply salves or appointments to incision. No heavy lifting with the upper extremities greater than 15 pounds. May resume driving when not requiring pain medication and patient feels comfortable with doing so.   Incentive spirometry RT    Complete by:  As directed    Increase activity slowly    Complete by:  As directed      Allergies as of 09/01/2016      Reactions   Penicillins  Hives, Other (See Comments)   Has patient had a PCN reaction causing immediate rash, facial/tongue/throat swelling, SOB or lightheadedness with hypotension: No Has patient had a PCN reaction causing SEVERE RASH INVOLVING MUCUS MEMBRANES or SKIN NECROSIS: #  #  #  YES  #  #  #  Has patient had a PCN reaction that required hospitalization No Has patient had a PCN reaction occurring within the last 10 years: No      Medication List    TAKE these medications   cetirizine 10 MG tablet Commonly known as:  ZYRTEC Take 10 mg by mouth daily as needed for allergies.   cycloSPORINE 0.05 % ophthalmic emulsion Commonly known as:  RESTASIS Place 1 drop into both eyes 2 (two) times daily as needed (dry eyes).   dexamethasone 1 MG tablet Commonly known as:  DECADRON 2 tablets twice daily for 2 days, one tablet twice daily for 2 days, one tablet daily for 2 days.   diphenhydrAMINE 25 MG tablet Commonly known as:  BENADRYL Take 25 mg by mouth at bedtime as needed for sleep.   FISH OIL PO Take 1 capsule by mouth daily. Omega red   HYDROcodone-acetaminophen 5-325 MG tablet Commonly known as:  NORCO/VICODIN Take 1-2 tablets by mouth every 4 (four) hours as needed (breakthrough pain).   meloxicam 15 MG tablet Commonly known as:  MOBIC Take 15 mg by mouth daily.   methocarbamol 500 MG tablet Commonly known as:  ROBAXIN Take 1 tablet (500 mg total) by mouth every 6 (six) hours as  needed for muscle spasms.   multivitamin tablet Take 1 tablet by mouth daily.   omeprazole 20 MG tablet Commonly known as:  PRILOSEC OTC Take 20 mg by mouth daily.   rosuvastatin 20 MG tablet Commonly known as:  CRESTOR Take 20 mg by mouth as directed. Take Mon, Tues, Wed, Thur, Fri   Vitamin D 2000 units Caps Take 2,000 Units by mouth daily.        SignedEarleen Newport 09/01/2016, 10:17 AM

## 2016-09-17 DIAGNOSIS — M4316 Spondylolisthesis, lumbar region: Secondary | ICD-10-CM | POA: Diagnosis not present

## 2016-09-21 DIAGNOSIS — M79672 Pain in left foot: Secondary | ICD-10-CM | POA: Diagnosis not present

## 2016-09-21 DIAGNOSIS — G8929 Other chronic pain: Secondary | ICD-10-CM | POA: Diagnosis not present

## 2016-09-21 DIAGNOSIS — M7662 Achilles tendinitis, left leg: Secondary | ICD-10-CM | POA: Diagnosis not present

## 2016-09-30 DIAGNOSIS — M7662 Achilles tendinitis, left leg: Secondary | ICD-10-CM | POA: Diagnosis not present

## 2016-10-02 DIAGNOSIS — M7662 Achilles tendinitis, left leg: Secondary | ICD-10-CM | POA: Diagnosis not present

## 2016-10-05 DIAGNOSIS — M7662 Achilles tendinitis, left leg: Secondary | ICD-10-CM | POA: Diagnosis not present

## 2016-10-08 DIAGNOSIS — M7662 Achilles tendinitis, left leg: Secondary | ICD-10-CM | POA: Diagnosis not present

## 2016-10-12 DIAGNOSIS — M7662 Achilles tendinitis, left leg: Secondary | ICD-10-CM | POA: Diagnosis not present

## 2016-10-15 DIAGNOSIS — M7662 Achilles tendinitis, left leg: Secondary | ICD-10-CM | POA: Diagnosis not present

## 2016-10-22 DIAGNOSIS — M7662 Achilles tendinitis, left leg: Secondary | ICD-10-CM | POA: Diagnosis not present

## 2016-11-09 DIAGNOSIS — M7662 Achilles tendinitis, left leg: Secondary | ICD-10-CM | POA: Diagnosis not present

## 2016-11-09 DIAGNOSIS — G8929 Other chronic pain: Secondary | ICD-10-CM | POA: Diagnosis not present

## 2016-11-09 DIAGNOSIS — M79672 Pain in left foot: Secondary | ICD-10-CM | POA: Diagnosis not present

## 2016-11-26 DIAGNOSIS — M4316 Spondylolisthesis, lumbar region: Secondary | ICD-10-CM | POA: Diagnosis not present

## 2016-12-21 DIAGNOSIS — M7662 Achilles tendinitis, left leg: Secondary | ICD-10-CM | POA: Diagnosis not present

## 2016-12-26 DIAGNOSIS — M7662 Achilles tendinitis, left leg: Secondary | ICD-10-CM | POA: Diagnosis not present

## 2016-12-30 DIAGNOSIS — S86012A Strain of left Achilles tendon, initial encounter: Secondary | ICD-10-CM | POA: Diagnosis not present

## 2017-01-14 DIAGNOSIS — S86012D Strain of left Achilles tendon, subsequent encounter: Secondary | ICD-10-CM | POA: Diagnosis not present

## 2017-01-27 DIAGNOSIS — S86012D Strain of left Achilles tendon, subsequent encounter: Secondary | ICD-10-CM | POA: Diagnosis not present

## 2017-02-03 DIAGNOSIS — E784 Other hyperlipidemia: Secondary | ICD-10-CM | POA: Diagnosis not present

## 2017-02-03 DIAGNOSIS — R7309 Other abnormal glucose: Secondary | ICD-10-CM | POA: Diagnosis not present

## 2017-02-03 DIAGNOSIS — Z125 Encounter for screening for malignant neoplasm of prostate: Secondary | ICD-10-CM | POA: Diagnosis not present

## 2017-02-09 DIAGNOSIS — Z0000 Encounter for general adult medical examination without abnormal findings: Secondary | ICD-10-CM | POA: Diagnosis not present

## 2017-02-09 DIAGNOSIS — Z6826 Body mass index (BMI) 26.0-26.9, adult: Secondary | ICD-10-CM | POA: Diagnosis not present

## 2017-02-09 DIAGNOSIS — M5416 Radiculopathy, lumbar region: Secondary | ICD-10-CM | POA: Diagnosis not present

## 2017-02-09 DIAGNOSIS — E784 Other hyperlipidemia: Secondary | ICD-10-CM | POA: Diagnosis not present

## 2017-02-09 DIAGNOSIS — F5221 Male erectile disorder: Secondary | ICD-10-CM | POA: Diagnosis not present

## 2017-02-09 DIAGNOSIS — D229 Melanocytic nevi, unspecified: Secondary | ICD-10-CM | POA: Diagnosis not present

## 2017-02-09 DIAGNOSIS — M1990 Unspecified osteoarthritis, unspecified site: Secondary | ICD-10-CM | POA: Diagnosis not present

## 2017-02-09 DIAGNOSIS — J3089 Other allergic rhinitis: Secondary | ICD-10-CM | POA: Diagnosis not present

## 2017-02-09 DIAGNOSIS — Z23 Encounter for immunization: Secondary | ICD-10-CM | POA: Diagnosis not present

## 2017-02-09 DIAGNOSIS — M7660 Achilles tendinitis, unspecified leg: Secondary | ICD-10-CM | POA: Insufficient documentation

## 2017-02-09 DIAGNOSIS — R7309 Other abnormal glucose: Secondary | ICD-10-CM | POA: Diagnosis not present

## 2017-02-09 DIAGNOSIS — M7662 Achilles tendinitis, left leg: Secondary | ICD-10-CM | POA: Diagnosis not present

## 2017-02-09 DIAGNOSIS — Z1389 Encounter for screening for other disorder: Secondary | ICD-10-CM | POA: Diagnosis not present

## 2017-02-10 DIAGNOSIS — S86012D Strain of left Achilles tendon, subsequent encounter: Secondary | ICD-10-CM | POA: Diagnosis not present

## 2017-02-12 DIAGNOSIS — Z1212 Encounter for screening for malignant neoplasm of rectum: Secondary | ICD-10-CM | POA: Diagnosis not present

## 2017-02-24 DIAGNOSIS — S86012D Strain of left Achilles tendon, subsequent encounter: Secondary | ICD-10-CM | POA: Diagnosis not present

## 2017-03-01 DIAGNOSIS — S86012D Strain of left Achilles tendon, subsequent encounter: Secondary | ICD-10-CM | POA: Diagnosis not present

## 2017-03-02 DIAGNOSIS — M25672 Stiffness of left ankle, not elsewhere classified: Secondary | ICD-10-CM | POA: Diagnosis not present

## 2017-03-02 DIAGNOSIS — S86012D Strain of left Achilles tendon, subsequent encounter: Secondary | ICD-10-CM | POA: Diagnosis not present

## 2017-03-02 DIAGNOSIS — M25572 Pain in left ankle and joints of left foot: Secondary | ICD-10-CM | POA: Diagnosis not present

## 2017-03-02 DIAGNOSIS — R262 Difficulty in walking, not elsewhere classified: Secondary | ICD-10-CM | POA: Diagnosis not present

## 2017-03-05 DIAGNOSIS — M25572 Pain in left ankle and joints of left foot: Secondary | ICD-10-CM | POA: Diagnosis not present

## 2017-03-05 DIAGNOSIS — S86012D Strain of left Achilles tendon, subsequent encounter: Secondary | ICD-10-CM | POA: Diagnosis not present

## 2017-03-05 DIAGNOSIS — R262 Difficulty in walking, not elsewhere classified: Secondary | ICD-10-CM | POA: Diagnosis not present

## 2017-03-05 DIAGNOSIS — M25672 Stiffness of left ankle, not elsewhere classified: Secondary | ICD-10-CM | POA: Diagnosis not present

## 2017-03-08 DIAGNOSIS — S86012D Strain of left Achilles tendon, subsequent encounter: Secondary | ICD-10-CM | POA: Diagnosis not present

## 2017-03-08 DIAGNOSIS — R262 Difficulty in walking, not elsewhere classified: Secondary | ICD-10-CM | POA: Diagnosis not present

## 2017-03-08 DIAGNOSIS — M25572 Pain in left ankle and joints of left foot: Secondary | ICD-10-CM | POA: Diagnosis not present

## 2017-03-08 DIAGNOSIS — M25672 Stiffness of left ankle, not elsewhere classified: Secondary | ICD-10-CM | POA: Diagnosis not present

## 2017-03-09 DIAGNOSIS — Z23 Encounter for immunization: Secondary | ICD-10-CM | POA: Diagnosis not present

## 2017-03-11 DIAGNOSIS — R262 Difficulty in walking, not elsewhere classified: Secondary | ICD-10-CM | POA: Diagnosis not present

## 2017-03-11 DIAGNOSIS — M25572 Pain in left ankle and joints of left foot: Secondary | ICD-10-CM | POA: Diagnosis not present

## 2017-03-11 DIAGNOSIS — M25672 Stiffness of left ankle, not elsewhere classified: Secondary | ICD-10-CM | POA: Diagnosis not present

## 2017-03-11 DIAGNOSIS — S86012D Strain of left Achilles tendon, subsequent encounter: Secondary | ICD-10-CM | POA: Diagnosis not present

## 2017-03-12 DIAGNOSIS — R262 Difficulty in walking, not elsewhere classified: Secondary | ICD-10-CM | POA: Diagnosis not present

## 2017-03-12 DIAGNOSIS — S86012D Strain of left Achilles tendon, subsequent encounter: Secondary | ICD-10-CM | POA: Diagnosis not present

## 2017-03-12 DIAGNOSIS — M25572 Pain in left ankle and joints of left foot: Secondary | ICD-10-CM | POA: Diagnosis not present

## 2017-03-12 DIAGNOSIS — M25672 Stiffness of left ankle, not elsewhere classified: Secondary | ICD-10-CM | POA: Diagnosis not present

## 2017-03-15 DIAGNOSIS — S86012D Strain of left Achilles tendon, subsequent encounter: Secondary | ICD-10-CM | POA: Diagnosis not present

## 2017-03-15 DIAGNOSIS — M25672 Stiffness of left ankle, not elsewhere classified: Secondary | ICD-10-CM | POA: Diagnosis not present

## 2017-03-15 DIAGNOSIS — M25572 Pain in left ankle and joints of left foot: Secondary | ICD-10-CM | POA: Diagnosis not present

## 2017-03-15 DIAGNOSIS — R262 Difficulty in walking, not elsewhere classified: Secondary | ICD-10-CM | POA: Diagnosis not present

## 2017-03-18 DIAGNOSIS — R262 Difficulty in walking, not elsewhere classified: Secondary | ICD-10-CM | POA: Diagnosis not present

## 2017-03-18 DIAGNOSIS — M25572 Pain in left ankle and joints of left foot: Secondary | ICD-10-CM | POA: Diagnosis not present

## 2017-03-18 DIAGNOSIS — S86012D Strain of left Achilles tendon, subsequent encounter: Secondary | ICD-10-CM | POA: Diagnosis not present

## 2017-03-18 DIAGNOSIS — M25672 Stiffness of left ankle, not elsewhere classified: Secondary | ICD-10-CM | POA: Diagnosis not present

## 2017-03-23 DIAGNOSIS — M25672 Stiffness of left ankle, not elsewhere classified: Secondary | ICD-10-CM | POA: Diagnosis not present

## 2017-03-23 DIAGNOSIS — R262 Difficulty in walking, not elsewhere classified: Secondary | ICD-10-CM | POA: Diagnosis not present

## 2017-03-23 DIAGNOSIS — S86012D Strain of left Achilles tendon, subsequent encounter: Secondary | ICD-10-CM | POA: Diagnosis not present

## 2017-03-23 DIAGNOSIS — M25572 Pain in left ankle and joints of left foot: Secondary | ICD-10-CM | POA: Diagnosis not present

## 2017-03-24 DIAGNOSIS — M722 Plantar fascial fibromatosis: Secondary | ICD-10-CM | POA: Diagnosis not present

## 2017-03-24 DIAGNOSIS — S86012D Strain of left Achilles tendon, subsequent encounter: Secondary | ICD-10-CM | POA: Diagnosis not present

## 2017-03-26 DIAGNOSIS — M25672 Stiffness of left ankle, not elsewhere classified: Secondary | ICD-10-CM | POA: Diagnosis not present

## 2017-03-26 DIAGNOSIS — S86012D Strain of left Achilles tendon, subsequent encounter: Secondary | ICD-10-CM | POA: Diagnosis not present

## 2017-03-26 DIAGNOSIS — R262 Difficulty in walking, not elsewhere classified: Secondary | ICD-10-CM | POA: Diagnosis not present

## 2017-03-26 DIAGNOSIS — M25572 Pain in left ankle and joints of left foot: Secondary | ICD-10-CM | POA: Diagnosis not present

## 2017-03-29 DIAGNOSIS — R262 Difficulty in walking, not elsewhere classified: Secondary | ICD-10-CM | POA: Diagnosis not present

## 2017-03-29 DIAGNOSIS — M25672 Stiffness of left ankle, not elsewhere classified: Secondary | ICD-10-CM | POA: Diagnosis not present

## 2017-03-29 DIAGNOSIS — M25572 Pain in left ankle and joints of left foot: Secondary | ICD-10-CM | POA: Diagnosis not present

## 2017-03-29 DIAGNOSIS — S86012D Strain of left Achilles tendon, subsequent encounter: Secondary | ICD-10-CM | POA: Diagnosis not present

## 2017-04-01 DIAGNOSIS — S86012D Strain of left Achilles tendon, subsequent encounter: Secondary | ICD-10-CM | POA: Diagnosis not present

## 2017-04-01 DIAGNOSIS — R262 Difficulty in walking, not elsewhere classified: Secondary | ICD-10-CM | POA: Diagnosis not present

## 2017-04-01 DIAGNOSIS — M25572 Pain in left ankle and joints of left foot: Secondary | ICD-10-CM | POA: Diagnosis not present

## 2017-04-01 DIAGNOSIS — M25672 Stiffness of left ankle, not elsewhere classified: Secondary | ICD-10-CM | POA: Diagnosis not present

## 2017-04-07 DIAGNOSIS — S86012D Strain of left Achilles tendon, subsequent encounter: Secondary | ICD-10-CM | POA: Diagnosis not present

## 2017-04-07 DIAGNOSIS — M25572 Pain in left ankle and joints of left foot: Secondary | ICD-10-CM | POA: Diagnosis not present

## 2017-04-07 DIAGNOSIS — M4316 Spondylolisthesis, lumbar region: Secondary | ICD-10-CM | POA: Diagnosis not present

## 2017-04-07 DIAGNOSIS — M25672 Stiffness of left ankle, not elsewhere classified: Secondary | ICD-10-CM | POA: Diagnosis not present

## 2017-04-07 DIAGNOSIS — R262 Difficulty in walking, not elsewhere classified: Secondary | ICD-10-CM | POA: Diagnosis not present

## 2017-04-08 DIAGNOSIS — M25572 Pain in left ankle and joints of left foot: Secondary | ICD-10-CM | POA: Diagnosis not present

## 2017-04-08 DIAGNOSIS — S86012D Strain of left Achilles tendon, subsequent encounter: Secondary | ICD-10-CM | POA: Diagnosis not present

## 2017-04-08 DIAGNOSIS — M25672 Stiffness of left ankle, not elsewhere classified: Secondary | ICD-10-CM | POA: Diagnosis not present

## 2017-04-08 DIAGNOSIS — R262 Difficulty in walking, not elsewhere classified: Secondary | ICD-10-CM | POA: Diagnosis not present

## 2017-04-09 DIAGNOSIS — M48061 Spinal stenosis, lumbar region without neurogenic claudication: Secondary | ICD-10-CM | POA: Diagnosis not present

## 2017-04-09 DIAGNOSIS — M4726 Other spondylosis with radiculopathy, lumbar region: Secondary | ICD-10-CM | POA: Diagnosis not present

## 2017-04-09 DIAGNOSIS — M5136 Other intervertebral disc degeneration, lumbar region: Secondary | ICD-10-CM | POA: Diagnosis not present

## 2017-04-09 DIAGNOSIS — M5416 Radiculopathy, lumbar region: Secondary | ICD-10-CM | POA: Diagnosis not present

## 2017-04-12 DIAGNOSIS — R262 Difficulty in walking, not elsewhere classified: Secondary | ICD-10-CM | POA: Diagnosis not present

## 2017-04-12 DIAGNOSIS — M25572 Pain in left ankle and joints of left foot: Secondary | ICD-10-CM | POA: Diagnosis not present

## 2017-04-12 DIAGNOSIS — M25672 Stiffness of left ankle, not elsewhere classified: Secondary | ICD-10-CM | POA: Diagnosis not present

## 2017-04-12 DIAGNOSIS — S86012D Strain of left Achilles tendon, subsequent encounter: Secondary | ICD-10-CM | POA: Diagnosis not present

## 2017-04-16 DIAGNOSIS — S86012D Strain of left Achilles tendon, subsequent encounter: Secondary | ICD-10-CM | POA: Diagnosis not present

## 2017-04-16 DIAGNOSIS — M25572 Pain in left ankle and joints of left foot: Secondary | ICD-10-CM | POA: Diagnosis not present

## 2017-04-16 DIAGNOSIS — R262 Difficulty in walking, not elsewhere classified: Secondary | ICD-10-CM | POA: Diagnosis not present

## 2017-04-16 DIAGNOSIS — M25672 Stiffness of left ankle, not elsewhere classified: Secondary | ICD-10-CM | POA: Diagnosis not present

## 2017-04-19 DIAGNOSIS — M25572 Pain in left ankle and joints of left foot: Secondary | ICD-10-CM | POA: Diagnosis not present

## 2017-04-19 DIAGNOSIS — R262 Difficulty in walking, not elsewhere classified: Secondary | ICD-10-CM | POA: Diagnosis not present

## 2017-04-19 DIAGNOSIS — S86012D Strain of left Achilles tendon, subsequent encounter: Secondary | ICD-10-CM | POA: Diagnosis not present

## 2017-04-19 DIAGNOSIS — M25672 Stiffness of left ankle, not elsewhere classified: Secondary | ICD-10-CM | POA: Diagnosis not present

## 2017-04-28 DIAGNOSIS — M25572 Pain in left ankle and joints of left foot: Secondary | ICD-10-CM | POA: Diagnosis not present

## 2017-04-28 DIAGNOSIS — M25672 Stiffness of left ankle, not elsewhere classified: Secondary | ICD-10-CM | POA: Diagnosis not present

## 2017-04-28 DIAGNOSIS — R262 Difficulty in walking, not elsewhere classified: Secondary | ICD-10-CM | POA: Diagnosis not present

## 2017-04-28 DIAGNOSIS — S86012D Strain of left Achilles tendon, subsequent encounter: Secondary | ICD-10-CM | POA: Diagnosis not present

## 2017-05-04 DIAGNOSIS — M25672 Stiffness of left ankle, not elsewhere classified: Secondary | ICD-10-CM | POA: Diagnosis not present

## 2017-05-04 DIAGNOSIS — M25572 Pain in left ankle and joints of left foot: Secondary | ICD-10-CM | POA: Diagnosis not present

## 2017-05-04 DIAGNOSIS — S86012D Strain of left Achilles tendon, subsequent encounter: Secondary | ICD-10-CM | POA: Diagnosis not present

## 2017-05-04 DIAGNOSIS — R262 Difficulty in walking, not elsewhere classified: Secondary | ICD-10-CM | POA: Diagnosis not present

## 2017-05-05 DIAGNOSIS — M722 Plantar fascial fibromatosis: Secondary | ICD-10-CM | POA: Diagnosis not present

## 2017-05-05 DIAGNOSIS — M79672 Pain in left foot: Secondary | ICD-10-CM | POA: Diagnosis not present

## 2017-05-07 DIAGNOSIS — R262 Difficulty in walking, not elsewhere classified: Secondary | ICD-10-CM | POA: Diagnosis not present

## 2017-05-07 DIAGNOSIS — M25672 Stiffness of left ankle, not elsewhere classified: Secondary | ICD-10-CM | POA: Diagnosis not present

## 2017-05-07 DIAGNOSIS — S86012D Strain of left Achilles tendon, subsequent encounter: Secondary | ICD-10-CM | POA: Diagnosis not present

## 2017-05-07 DIAGNOSIS — M25572 Pain in left ankle and joints of left foot: Secondary | ICD-10-CM | POA: Diagnosis not present

## 2017-05-13 DIAGNOSIS — M25572 Pain in left ankle and joints of left foot: Secondary | ICD-10-CM | POA: Diagnosis not present

## 2017-05-13 DIAGNOSIS — S86012D Strain of left Achilles tendon, subsequent encounter: Secondary | ICD-10-CM | POA: Diagnosis not present

## 2017-05-13 DIAGNOSIS — M25672 Stiffness of left ankle, not elsewhere classified: Secondary | ICD-10-CM | POA: Diagnosis not present

## 2017-05-13 DIAGNOSIS — R262 Difficulty in walking, not elsewhere classified: Secondary | ICD-10-CM | POA: Diagnosis not present

## 2017-05-15 DIAGNOSIS — M25672 Stiffness of left ankle, not elsewhere classified: Secondary | ICD-10-CM | POA: Diagnosis not present

## 2017-05-15 DIAGNOSIS — M25572 Pain in left ankle and joints of left foot: Secondary | ICD-10-CM | POA: Diagnosis not present

## 2017-05-15 DIAGNOSIS — S86012D Strain of left Achilles tendon, subsequent encounter: Secondary | ICD-10-CM | POA: Diagnosis not present

## 2017-05-15 DIAGNOSIS — R262 Difficulty in walking, not elsewhere classified: Secondary | ICD-10-CM | POA: Diagnosis not present

## 2017-05-18 DIAGNOSIS — S86012D Strain of left Achilles tendon, subsequent encounter: Secondary | ICD-10-CM | POA: Diagnosis not present

## 2017-05-18 DIAGNOSIS — M25672 Stiffness of left ankle, not elsewhere classified: Secondary | ICD-10-CM | POA: Diagnosis not present

## 2017-05-18 DIAGNOSIS — R262 Difficulty in walking, not elsewhere classified: Secondary | ICD-10-CM | POA: Diagnosis not present

## 2017-05-18 DIAGNOSIS — M25572 Pain in left ankle and joints of left foot: Secondary | ICD-10-CM | POA: Diagnosis not present

## 2017-06-16 DIAGNOSIS — H40013 Open angle with borderline findings, low risk, bilateral: Secondary | ICD-10-CM | POA: Diagnosis not present

## 2017-06-17 DIAGNOSIS — H5347 Heteronymous bilateral field defects: Secondary | ICD-10-CM | POA: Insufficient documentation

## 2017-06-24 DIAGNOSIS — H5347 Heteronymous bilateral field defects: Secondary | ICD-10-CM | POA: Diagnosis not present

## 2017-06-30 DIAGNOSIS — H5347 Heteronymous bilateral field defects: Secondary | ICD-10-CM | POA: Diagnosis not present

## 2017-06-30 DIAGNOSIS — R51 Headache: Secondary | ICD-10-CM | POA: Diagnosis not present

## 2017-07-01 DIAGNOSIS — Z6826 Body mass index (BMI) 26.0-26.9, adult: Secondary | ICD-10-CM | POA: Diagnosis not present

## 2017-07-01 DIAGNOSIS — R030 Elevated blood-pressure reading, without diagnosis of hypertension: Secondary | ICD-10-CM | POA: Diagnosis not present

## 2017-07-01 DIAGNOSIS — H5347 Heteronymous bilateral field defects: Secondary | ICD-10-CM | POA: Diagnosis not present

## 2017-07-19 DIAGNOSIS — M5136 Other intervertebral disc degeneration, lumbar region: Secondary | ICD-10-CM | POA: Diagnosis not present

## 2017-07-19 DIAGNOSIS — M48061 Spinal stenosis, lumbar region without neurogenic claudication: Secondary | ICD-10-CM | POA: Diagnosis not present

## 2017-07-19 DIAGNOSIS — Z981 Arthrodesis status: Secondary | ICD-10-CM | POA: Diagnosis not present

## 2017-07-19 DIAGNOSIS — M5416 Radiculopathy, lumbar region: Secondary | ICD-10-CM | POA: Diagnosis not present

## 2017-07-19 DIAGNOSIS — M4726 Other spondylosis with radiculopathy, lumbar region: Secondary | ICD-10-CM | POA: Diagnosis not present

## 2017-07-26 DIAGNOSIS — M67969 Unspecified disorder of synovium and tendon, unspecified lower leg: Secondary | ICD-10-CM | POA: Diagnosis not present

## 2017-07-26 DIAGNOSIS — M79672 Pain in left foot: Secondary | ICD-10-CM | POA: Diagnosis not present

## 2017-08-01 DIAGNOSIS — J019 Acute sinusitis, unspecified: Secondary | ICD-10-CM | POA: Diagnosis not present

## 2017-08-01 DIAGNOSIS — J209 Acute bronchitis, unspecified: Secondary | ICD-10-CM | POA: Diagnosis not present

## 2017-08-10 DIAGNOSIS — Z8546 Personal history of malignant neoplasm of prostate: Secondary | ICD-10-CM | POA: Diagnosis not present

## 2017-08-10 DIAGNOSIS — R102 Pelvic and perineal pain: Secondary | ICD-10-CM | POA: Diagnosis not present

## 2017-08-17 DIAGNOSIS — R300 Dysuria: Secondary | ICD-10-CM | POA: Diagnosis not present

## 2017-08-17 DIAGNOSIS — R102 Pelvic and perineal pain: Secondary | ICD-10-CM | POA: Diagnosis not present

## 2017-08-17 DIAGNOSIS — N3941 Urge incontinence: Secondary | ICD-10-CM | POA: Diagnosis not present

## 2017-08-17 DIAGNOSIS — M6281 Muscle weakness (generalized): Secondary | ICD-10-CM | POA: Diagnosis not present

## 2017-08-17 DIAGNOSIS — M62838 Other muscle spasm: Secondary | ICD-10-CM | POA: Diagnosis not present

## 2017-08-31 DIAGNOSIS — M6281 Muscle weakness (generalized): Secondary | ICD-10-CM | POA: Diagnosis not present

## 2017-08-31 DIAGNOSIS — R102 Pelvic and perineal pain: Secondary | ICD-10-CM | POA: Diagnosis not present

## 2017-08-31 DIAGNOSIS — R300 Dysuria: Secondary | ICD-10-CM | POA: Diagnosis not present

## 2017-08-31 DIAGNOSIS — M62838 Other muscle spasm: Secondary | ICD-10-CM | POA: Diagnosis not present

## 2017-08-31 DIAGNOSIS — N3941 Urge incontinence: Secondary | ICD-10-CM | POA: Diagnosis not present

## 2017-09-13 DIAGNOSIS — I10 Essential (primary) hypertension: Secondary | ICD-10-CM | POA: Diagnosis not present

## 2017-09-13 DIAGNOSIS — Z6825 Body mass index (BMI) 25.0-25.9, adult: Secondary | ICD-10-CM | POA: Diagnosis not present

## 2017-09-13 DIAGNOSIS — M47816 Spondylosis without myelopathy or radiculopathy, lumbar region: Secondary | ICD-10-CM | POA: Diagnosis not present

## 2017-09-13 DIAGNOSIS — S3992XA Unspecified injury of lower back, initial encounter: Secondary | ICD-10-CM | POA: Diagnosis not present

## 2017-09-28 DIAGNOSIS — H40013 Open angle with borderline findings, low risk, bilateral: Secondary | ICD-10-CM | POA: Diagnosis not present

## 2017-10-26 DIAGNOSIS — M5136 Other intervertebral disc degeneration, lumbar region: Secondary | ICD-10-CM | POA: Diagnosis not present

## 2017-10-26 DIAGNOSIS — Z981 Arthrodesis status: Secondary | ICD-10-CM | POA: Diagnosis not present

## 2017-10-26 DIAGNOSIS — M5417 Radiculopathy, lumbosacral region: Secondary | ICD-10-CM | POA: Diagnosis not present

## 2017-10-26 DIAGNOSIS — M5416 Radiculopathy, lumbar region: Secondary | ICD-10-CM | POA: Diagnosis not present

## 2017-10-27 DIAGNOSIS — M25571 Pain in right ankle and joints of right foot: Secondary | ICD-10-CM | POA: Diagnosis not present

## 2017-11-01 DIAGNOSIS — M25571 Pain in right ankle and joints of right foot: Secondary | ICD-10-CM | POA: Diagnosis not present

## 2017-11-05 DIAGNOSIS — S86091A Other specified injury of right Achilles tendon, initial encounter: Secondary | ICD-10-CM | POA: Diagnosis not present

## 2017-11-05 DIAGNOSIS — S86011A Strain of right Achilles tendon, initial encounter: Secondary | ICD-10-CM | POA: Insufficient documentation

## 2017-11-23 DIAGNOSIS — S86091D Other specified injury of right Achilles tendon, subsequent encounter: Secondary | ICD-10-CM | POA: Diagnosis not present

## 2017-12-13 DIAGNOSIS — R102 Pelvic and perineal pain: Secondary | ICD-10-CM | POA: Diagnosis not present

## 2017-12-13 DIAGNOSIS — R300 Dysuria: Secondary | ICD-10-CM | POA: Diagnosis not present

## 2017-12-13 DIAGNOSIS — M62838 Other muscle spasm: Secondary | ICD-10-CM | POA: Diagnosis not present

## 2017-12-13 DIAGNOSIS — N3941 Urge incontinence: Secondary | ICD-10-CM | POA: Diagnosis not present

## 2017-12-13 DIAGNOSIS — M6281 Muscle weakness (generalized): Secondary | ICD-10-CM | POA: Diagnosis not present

## 2017-12-13 DIAGNOSIS — Z8546 Personal history of malignant neoplasm of prostate: Secondary | ICD-10-CM | POA: Diagnosis not present

## 2017-12-27 DIAGNOSIS — N3941 Urge incontinence: Secondary | ICD-10-CM | POA: Diagnosis not present

## 2017-12-27 DIAGNOSIS — R300 Dysuria: Secondary | ICD-10-CM | POA: Diagnosis not present

## 2017-12-27 DIAGNOSIS — R102 Pelvic and perineal pain: Secondary | ICD-10-CM | POA: Diagnosis not present

## 2017-12-27 DIAGNOSIS — M62838 Other muscle spasm: Secondary | ICD-10-CM | POA: Diagnosis not present

## 2017-12-27 DIAGNOSIS — M6281 Muscle weakness (generalized): Secondary | ICD-10-CM | POA: Diagnosis not present

## 2017-12-27 DIAGNOSIS — S86091D Other specified injury of right Achilles tendon, subsequent encounter: Secondary | ICD-10-CM | POA: Diagnosis not present

## 2017-12-29 DIAGNOSIS — S86111A Strain of other muscle(s) and tendon(s) of posterior muscle group at lower leg level, right leg, initial encounter: Secondary | ICD-10-CM | POA: Diagnosis not present

## 2017-12-29 DIAGNOSIS — S86091A Other specified injury of right Achilles tendon, initial encounter: Secondary | ICD-10-CM | POA: Diagnosis not present

## 2018-01-05 DIAGNOSIS — S86111D Strain of other muscle(s) and tendon(s) of posterior muscle group at lower leg level, right leg, subsequent encounter: Secondary | ICD-10-CM | POA: Diagnosis not present

## 2018-01-05 DIAGNOSIS — M25671 Stiffness of right ankle, not elsewhere classified: Secondary | ICD-10-CM | POA: Diagnosis not present

## 2018-01-05 DIAGNOSIS — M25471 Effusion, right ankle: Secondary | ICD-10-CM | POA: Diagnosis not present

## 2018-01-05 DIAGNOSIS — M25571 Pain in right ankle and joints of right foot: Secondary | ICD-10-CM | POA: Diagnosis not present

## 2018-01-10 DIAGNOSIS — S86111D Strain of other muscle(s) and tendon(s) of posterior muscle group at lower leg level, right leg, subsequent encounter: Secondary | ICD-10-CM | POA: Diagnosis not present

## 2018-01-10 DIAGNOSIS — M25571 Pain in right ankle and joints of right foot: Secondary | ICD-10-CM | POA: Diagnosis not present

## 2018-01-10 DIAGNOSIS — M25471 Effusion, right ankle: Secondary | ICD-10-CM | POA: Diagnosis not present

## 2018-01-10 DIAGNOSIS — M25671 Stiffness of right ankle, not elsewhere classified: Secondary | ICD-10-CM | POA: Diagnosis not present

## 2018-01-12 DIAGNOSIS — S86111D Strain of other muscle(s) and tendon(s) of posterior muscle group at lower leg level, right leg, subsequent encounter: Secondary | ICD-10-CM | POA: Diagnosis not present

## 2018-01-12 DIAGNOSIS — M25671 Stiffness of right ankle, not elsewhere classified: Secondary | ICD-10-CM | POA: Diagnosis not present

## 2018-01-12 DIAGNOSIS — M25571 Pain in right ankle and joints of right foot: Secondary | ICD-10-CM | POA: Diagnosis not present

## 2018-01-12 DIAGNOSIS — M25471 Effusion, right ankle: Secondary | ICD-10-CM | POA: Diagnosis not present

## 2018-01-17 DIAGNOSIS — M25571 Pain in right ankle and joints of right foot: Secondary | ICD-10-CM | POA: Diagnosis not present

## 2018-01-17 DIAGNOSIS — M25471 Effusion, right ankle: Secondary | ICD-10-CM | POA: Diagnosis not present

## 2018-01-17 DIAGNOSIS — S86111D Strain of other muscle(s) and tendon(s) of posterior muscle group at lower leg level, right leg, subsequent encounter: Secondary | ICD-10-CM | POA: Diagnosis not present

## 2018-01-17 DIAGNOSIS — M25671 Stiffness of right ankle, not elsewhere classified: Secondary | ICD-10-CM | POA: Diagnosis not present

## 2018-01-19 DIAGNOSIS — S86111D Strain of other muscle(s) and tendon(s) of posterior muscle group at lower leg level, right leg, subsequent encounter: Secondary | ICD-10-CM | POA: Diagnosis not present

## 2018-01-19 DIAGNOSIS — M25671 Stiffness of right ankle, not elsewhere classified: Secondary | ICD-10-CM | POA: Diagnosis not present

## 2018-01-19 DIAGNOSIS — M25571 Pain in right ankle and joints of right foot: Secondary | ICD-10-CM | POA: Diagnosis not present

## 2018-01-19 DIAGNOSIS — M25471 Effusion, right ankle: Secondary | ICD-10-CM | POA: Diagnosis not present

## 2018-01-24 DIAGNOSIS — M25471 Effusion, right ankle: Secondary | ICD-10-CM | POA: Diagnosis not present

## 2018-01-24 DIAGNOSIS — M25571 Pain in right ankle and joints of right foot: Secondary | ICD-10-CM | POA: Diagnosis not present

## 2018-01-24 DIAGNOSIS — S86111D Strain of other muscle(s) and tendon(s) of posterior muscle group at lower leg level, right leg, subsequent encounter: Secondary | ICD-10-CM | POA: Diagnosis not present

## 2018-01-24 DIAGNOSIS — M25671 Stiffness of right ankle, not elsewhere classified: Secondary | ICD-10-CM | POA: Diagnosis not present

## 2018-01-26 DIAGNOSIS — M25571 Pain in right ankle and joints of right foot: Secondary | ICD-10-CM | POA: Diagnosis not present

## 2018-01-26 DIAGNOSIS — S86111D Strain of other muscle(s) and tendon(s) of posterior muscle group at lower leg level, right leg, subsequent encounter: Secondary | ICD-10-CM | POA: Diagnosis not present

## 2018-01-26 DIAGNOSIS — M25471 Effusion, right ankle: Secondary | ICD-10-CM | POA: Diagnosis not present

## 2018-01-26 DIAGNOSIS — M25671 Stiffness of right ankle, not elsewhere classified: Secondary | ICD-10-CM | POA: Diagnosis not present

## 2018-01-28 DIAGNOSIS — J019 Acute sinusitis, unspecified: Secondary | ICD-10-CM | POA: Diagnosis not present

## 2018-02-01 DIAGNOSIS — M25671 Stiffness of right ankle, not elsewhere classified: Secondary | ICD-10-CM | POA: Diagnosis not present

## 2018-02-01 DIAGNOSIS — M25571 Pain in right ankle and joints of right foot: Secondary | ICD-10-CM | POA: Diagnosis not present

## 2018-02-01 DIAGNOSIS — S86111D Strain of other muscle(s) and tendon(s) of posterior muscle group at lower leg level, right leg, subsequent encounter: Secondary | ICD-10-CM | POA: Diagnosis not present

## 2018-02-01 DIAGNOSIS — M25471 Effusion, right ankle: Secondary | ICD-10-CM | POA: Diagnosis not present

## 2018-02-02 DIAGNOSIS — M25671 Stiffness of right ankle, not elsewhere classified: Secondary | ICD-10-CM | POA: Diagnosis not present

## 2018-02-02 DIAGNOSIS — M25571 Pain in right ankle and joints of right foot: Secondary | ICD-10-CM | POA: Diagnosis not present

## 2018-02-02 DIAGNOSIS — M25471 Effusion, right ankle: Secondary | ICD-10-CM | POA: Diagnosis not present

## 2018-02-02 DIAGNOSIS — S86111D Strain of other muscle(s) and tendon(s) of posterior muscle group at lower leg level, right leg, subsequent encounter: Secondary | ICD-10-CM | POA: Diagnosis not present

## 2018-02-03 DIAGNOSIS — Z125 Encounter for screening for malignant neoplasm of prostate: Secondary | ICD-10-CM | POA: Diagnosis not present

## 2018-02-03 DIAGNOSIS — E7849 Other hyperlipidemia: Secondary | ICD-10-CM | POA: Diagnosis not present

## 2018-02-03 DIAGNOSIS — R82998 Other abnormal findings in urine: Secondary | ICD-10-CM | POA: Diagnosis not present

## 2018-02-03 DIAGNOSIS — R739 Hyperglycemia, unspecified: Secondary | ICD-10-CM | POA: Diagnosis not present

## 2018-02-07 DIAGNOSIS — M25571 Pain in right ankle and joints of right foot: Secondary | ICD-10-CM | POA: Diagnosis not present

## 2018-02-07 DIAGNOSIS — M25471 Effusion, right ankle: Secondary | ICD-10-CM | POA: Diagnosis not present

## 2018-02-07 DIAGNOSIS — S86091D Other specified injury of right Achilles tendon, subsequent encounter: Secondary | ICD-10-CM | POA: Diagnosis not present

## 2018-02-07 DIAGNOSIS — M25671 Stiffness of right ankle, not elsewhere classified: Secondary | ICD-10-CM | POA: Diagnosis not present

## 2018-02-07 DIAGNOSIS — S86111D Strain of other muscle(s) and tendon(s) of posterior muscle group at lower leg level, right leg, subsequent encounter: Secondary | ICD-10-CM | POA: Diagnosis not present

## 2018-02-09 DIAGNOSIS — M25671 Stiffness of right ankle, not elsewhere classified: Secondary | ICD-10-CM | POA: Diagnosis not present

## 2018-02-09 DIAGNOSIS — M25471 Effusion, right ankle: Secondary | ICD-10-CM | POA: Diagnosis not present

## 2018-02-09 DIAGNOSIS — M25571 Pain in right ankle and joints of right foot: Secondary | ICD-10-CM | POA: Diagnosis not present

## 2018-02-09 DIAGNOSIS — S86111D Strain of other muscle(s) and tendon(s) of posterior muscle group at lower leg level, right leg, subsequent encounter: Secondary | ICD-10-CM | POA: Diagnosis not present

## 2018-02-10 DIAGNOSIS — Z0000 Encounter for general adult medical examination without abnormal findings: Secondary | ICD-10-CM | POA: Diagnosis not present

## 2018-02-10 DIAGNOSIS — M1990 Unspecified osteoarthritis, unspecified site: Secondary | ICD-10-CM | POA: Diagnosis not present

## 2018-02-10 DIAGNOSIS — D229 Melanocytic nevi, unspecified: Secondary | ICD-10-CM | POA: Diagnosis not present

## 2018-02-10 DIAGNOSIS — D692 Other nonthrombocytopenic purpura: Secondary | ICD-10-CM | POA: Diagnosis not present

## 2018-02-10 DIAGNOSIS — Z6827 Body mass index (BMI) 27.0-27.9, adult: Secondary | ICD-10-CM | POA: Diagnosis not present

## 2018-02-10 DIAGNOSIS — J3089 Other allergic rhinitis: Secondary | ICD-10-CM | POA: Diagnosis not present

## 2018-02-10 DIAGNOSIS — Z1389 Encounter for screening for other disorder: Secondary | ICD-10-CM | POA: Diagnosis not present

## 2018-02-10 DIAGNOSIS — E7849 Other hyperlipidemia: Secondary | ICD-10-CM | POA: Diagnosis not present

## 2018-02-10 DIAGNOSIS — C61 Malignant neoplasm of prostate: Secondary | ICD-10-CM | POA: Diagnosis not present

## 2018-02-10 DIAGNOSIS — F5221 Male erectile disorder: Secondary | ICD-10-CM | POA: Diagnosis not present

## 2018-02-10 DIAGNOSIS — R7309 Other abnormal glucose: Secondary | ICD-10-CM | POA: Diagnosis not present

## 2018-02-10 DIAGNOSIS — M25571 Pain in right ankle and joints of right foot: Secondary | ICD-10-CM | POA: Diagnosis not present

## 2018-02-11 DIAGNOSIS — Z1212 Encounter for screening for malignant neoplasm of rectum: Secondary | ICD-10-CM | POA: Diagnosis not present

## 2018-02-21 DIAGNOSIS — M25571 Pain in right ankle and joints of right foot: Secondary | ICD-10-CM | POA: Diagnosis not present

## 2018-02-21 DIAGNOSIS — M25471 Effusion, right ankle: Secondary | ICD-10-CM | POA: Diagnosis not present

## 2018-02-21 DIAGNOSIS — M25671 Stiffness of right ankle, not elsewhere classified: Secondary | ICD-10-CM | POA: Diagnosis not present

## 2018-02-21 DIAGNOSIS — S86111D Strain of other muscle(s) and tendon(s) of posterior muscle group at lower leg level, right leg, subsequent encounter: Secondary | ICD-10-CM | POA: Diagnosis not present

## 2018-02-23 DIAGNOSIS — S86111D Strain of other muscle(s) and tendon(s) of posterior muscle group at lower leg level, right leg, subsequent encounter: Secondary | ICD-10-CM | POA: Diagnosis not present

## 2018-02-23 DIAGNOSIS — M25471 Effusion, right ankle: Secondary | ICD-10-CM | POA: Diagnosis not present

## 2018-02-23 DIAGNOSIS — M25671 Stiffness of right ankle, not elsewhere classified: Secondary | ICD-10-CM | POA: Diagnosis not present

## 2018-02-23 DIAGNOSIS — M25571 Pain in right ankle and joints of right foot: Secondary | ICD-10-CM | POA: Diagnosis not present

## 2018-03-02 DIAGNOSIS — M25471 Effusion, right ankle: Secondary | ICD-10-CM | POA: Diagnosis not present

## 2018-03-02 DIAGNOSIS — M25571 Pain in right ankle and joints of right foot: Secondary | ICD-10-CM | POA: Diagnosis not present

## 2018-03-02 DIAGNOSIS — M25671 Stiffness of right ankle, not elsewhere classified: Secondary | ICD-10-CM | POA: Diagnosis not present

## 2018-03-02 DIAGNOSIS — S86111D Strain of other muscle(s) and tendon(s) of posterior muscle group at lower leg level, right leg, subsequent encounter: Secondary | ICD-10-CM | POA: Diagnosis not present

## 2018-03-04 DIAGNOSIS — Z23 Encounter for immunization: Secondary | ICD-10-CM | POA: Diagnosis not present

## 2018-03-07 DIAGNOSIS — M25471 Effusion, right ankle: Secondary | ICD-10-CM | POA: Diagnosis not present

## 2018-03-07 DIAGNOSIS — M25671 Stiffness of right ankle, not elsewhere classified: Secondary | ICD-10-CM | POA: Diagnosis not present

## 2018-03-07 DIAGNOSIS — M25571 Pain in right ankle and joints of right foot: Secondary | ICD-10-CM | POA: Diagnosis not present

## 2018-03-07 DIAGNOSIS — S86111D Strain of other muscle(s) and tendon(s) of posterior muscle group at lower leg level, right leg, subsequent encounter: Secondary | ICD-10-CM | POA: Diagnosis not present

## 2018-03-09 DIAGNOSIS — S86111D Strain of other muscle(s) and tendon(s) of posterior muscle group at lower leg level, right leg, subsequent encounter: Secondary | ICD-10-CM | POA: Diagnosis not present

## 2018-03-09 DIAGNOSIS — M25671 Stiffness of right ankle, not elsewhere classified: Secondary | ICD-10-CM | POA: Diagnosis not present

## 2018-03-09 DIAGNOSIS — M25471 Effusion, right ankle: Secondary | ICD-10-CM | POA: Diagnosis not present

## 2018-03-09 DIAGNOSIS — M25571 Pain in right ankle and joints of right foot: Secondary | ICD-10-CM | POA: Diagnosis not present

## 2018-03-14 DIAGNOSIS — S86111D Strain of other muscle(s) and tendon(s) of posterior muscle group at lower leg level, right leg, subsequent encounter: Secondary | ICD-10-CM | POA: Diagnosis not present

## 2018-03-14 DIAGNOSIS — M25671 Stiffness of right ankle, not elsewhere classified: Secondary | ICD-10-CM | POA: Diagnosis not present

## 2018-03-14 DIAGNOSIS — M25571 Pain in right ankle and joints of right foot: Secondary | ICD-10-CM | POA: Diagnosis not present

## 2018-03-14 DIAGNOSIS — M25471 Effusion, right ankle: Secondary | ICD-10-CM | POA: Diagnosis not present

## 2018-03-25 DIAGNOSIS — M25571 Pain in right ankle and joints of right foot: Secondary | ICD-10-CM | POA: Diagnosis not present

## 2018-03-25 DIAGNOSIS — S86091A Other specified injury of right Achilles tendon, initial encounter: Secondary | ICD-10-CM | POA: Diagnosis not present

## 2018-03-29 DIAGNOSIS — H40013 Open angle with borderline findings, low risk, bilateral: Secondary | ICD-10-CM | POA: Diagnosis not present

## 2018-04-06 DIAGNOSIS — S86091A Other specified injury of right Achilles tendon, initial encounter: Secondary | ICD-10-CM | POA: Diagnosis not present

## 2018-04-08 DIAGNOSIS — S86091D Other specified injury of right Achilles tendon, subsequent encounter: Secondary | ICD-10-CM | POA: Diagnosis not present

## 2018-04-14 DIAGNOSIS — J209 Acute bronchitis, unspecified: Secondary | ICD-10-CM | POA: Diagnosis not present

## 2018-04-14 DIAGNOSIS — J019 Acute sinusitis, unspecified: Secondary | ICD-10-CM | POA: Diagnosis not present

## 2018-04-22 DIAGNOSIS — M545 Low back pain: Secondary | ICD-10-CM | POA: Diagnosis not present

## 2018-04-22 DIAGNOSIS — M961 Postlaminectomy syndrome, not elsewhere classified: Secondary | ICD-10-CM | POA: Diagnosis not present

## 2018-04-23 DIAGNOSIS — J209 Acute bronchitis, unspecified: Secondary | ICD-10-CM | POA: Diagnosis not present

## 2018-04-23 DIAGNOSIS — J019 Acute sinusitis, unspecified: Secondary | ICD-10-CM | POA: Diagnosis not present

## 2018-05-09 DIAGNOSIS — H5053 Vertical heterophoria: Secondary | ICD-10-CM | POA: Diagnosis not present

## 2018-05-09 DIAGNOSIS — H04123 Dry eye syndrome of bilateral lacrimal glands: Secondary | ICD-10-CM | POA: Diagnosis not present

## 2018-05-09 DIAGNOSIS — H5211 Myopia, right eye: Secondary | ICD-10-CM | POA: Diagnosis not present

## 2018-05-09 DIAGNOSIS — H52223 Regular astigmatism, bilateral: Secondary | ICD-10-CM | POA: Diagnosis not present

## 2018-05-09 DIAGNOSIS — H5202 Hypermetropia, left eye: Secondary | ICD-10-CM | POA: Diagnosis not present

## 2018-05-09 DIAGNOSIS — H524 Presbyopia: Secondary | ICD-10-CM | POA: Diagnosis not present

## 2018-06-07 DIAGNOSIS — M6701 Short Achilles tendon (acquired), right ankle: Secondary | ICD-10-CM | POA: Diagnosis not present

## 2018-06-07 DIAGNOSIS — G8918 Other acute postprocedural pain: Secondary | ICD-10-CM | POA: Diagnosis not present

## 2018-06-07 DIAGNOSIS — M66361 Spontaneous rupture of flexor tendons, right lower leg: Secondary | ICD-10-CM | POA: Diagnosis not present

## 2018-06-22 DIAGNOSIS — S86091D Other specified injury of right Achilles tendon, subsequent encounter: Secondary | ICD-10-CM | POA: Diagnosis not present

## 2018-06-22 DIAGNOSIS — S86091A Other specified injury of right Achilles tendon, initial encounter: Secondary | ICD-10-CM | POA: Diagnosis not present

## 2018-06-22 DIAGNOSIS — Z5189 Encounter for other specified aftercare: Secondary | ICD-10-CM | POA: Diagnosis not present

## 2018-07-10 ENCOUNTER — Other Ambulatory Visit: Payer: Self-pay | Admitting: Orthopedic Surgery

## 2018-07-10 ENCOUNTER — Other Ambulatory Visit: Payer: Self-pay

## 2018-07-10 ENCOUNTER — Ambulatory Visit (HOSPITAL_COMMUNITY): Payer: Medicare Other | Admitting: Certified Registered Nurse Anesthetist

## 2018-07-10 ENCOUNTER — Inpatient Hospital Stay (HOSPITAL_COMMUNITY)
Admission: EM | Admit: 2018-07-10 | Discharge: 2018-07-13 | DRG: 908 | Disposition: A | Discharge status: Home Health | Payer: Medicare Other | Attending: Orthopedic Surgery | Admitting: Orthopedic Surgery

## 2018-07-10 ENCOUNTER — Encounter (HOSPITAL_COMMUNITY): Payer: Self-pay | Admitting: Certified Registered Nurse Anesthetist

## 2018-07-10 ENCOUNTER — Encounter (HOSPITAL_COMMUNITY)
Admission: EM | Disposition: A | Discharge status: Home Health | Payer: Self-pay | Source: Home / Self Care | Attending: Orthopedic Surgery

## 2018-07-10 DIAGNOSIS — L089 Local infection of the skin and subcutaneous tissue, unspecified: Secondary | ICD-10-CM | POA: Diagnosis not present

## 2018-07-10 DIAGNOSIS — T8149XA Infection following a procedure, other surgical site, initial encounter: Secondary | ICD-10-CM | POA: Diagnosis not present

## 2018-07-10 DIAGNOSIS — M66871 Spontaneous rupture of other tendons, right ankle and foot: Secondary | ICD-10-CM | POA: Diagnosis present

## 2018-07-10 DIAGNOSIS — Z880 Allergy status to penicillin: Secondary | ICD-10-CM

## 2018-07-10 DIAGNOSIS — T8141XA Infection following a procedure, superficial incisional surgical site, initial encounter: Secondary | ICD-10-CM | POA: Diagnosis not present

## 2018-07-10 DIAGNOSIS — K219 Gastro-esophageal reflux disease without esophagitis: Secondary | ICD-10-CM | POA: Diagnosis not present

## 2018-07-10 DIAGNOSIS — Y838 Other surgical procedures as the cause of abnormal reaction of the patient, or of later complication, without mention of misadventure at the time of the procedure: Secondary | ICD-10-CM | POA: Diagnosis present

## 2018-07-10 DIAGNOSIS — L03115 Cellulitis of right lower limb: Secondary | ICD-10-CM | POA: Diagnosis present

## 2018-07-10 DIAGNOSIS — Z8546 Personal history of malignant neoplasm of prostate: Secondary | ICD-10-CM

## 2018-07-10 DIAGNOSIS — Z881 Allergy status to other antibiotic agents status: Secondary | ICD-10-CM | POA: Diagnosis not present

## 2018-07-10 DIAGNOSIS — Z8249 Family history of ischemic heart disease and other diseases of the circulatory system: Secondary | ICD-10-CM | POA: Diagnosis not present

## 2018-07-10 DIAGNOSIS — T8132XA Disruption of internal operation (surgical) wound, not elsewhere classified, initial encounter: Principal | ICD-10-CM | POA: Diagnosis present

## 2018-07-10 DIAGNOSIS — T8140XA Infection following a procedure, unspecified, initial encounter: Secondary | ICD-10-CM | POA: Diagnosis not present

## 2018-07-10 DIAGNOSIS — T8131XA Disruption of external operation (surgical) wound, not elsewhere classified, initial encounter: Secondary | ICD-10-CM

## 2018-07-10 DIAGNOSIS — B958 Unspecified staphylococcus as the cause of diseases classified elsewhere: Secondary | ICD-10-CM | POA: Diagnosis not present

## 2018-07-10 DIAGNOSIS — B9689 Other specified bacterial agents as the cause of diseases classified elsewhere: Secondary | ICD-10-CM | POA: Diagnosis not present

## 2018-07-10 DIAGNOSIS — S86011A Strain of right Achilles tendon, initial encounter: Secondary | ICD-10-CM | POA: Diagnosis not present

## 2018-07-10 HISTORY — PX: I&D EXTREMITY: SHX5045

## 2018-07-10 LAB — BASIC METABOLIC PANEL
Anion gap: 10 (ref 5–15)
BUN: 15 mg/dL (ref 8–23)
CO2: 23 mmol/L (ref 22–32)
Calcium: 9.4 mg/dL (ref 8.9–10.3)
Chloride: 105 mmol/L (ref 98–111)
Creatinine, Ser: 0.94 mg/dL (ref 0.61–1.24)
GFR calc Af Amer: >60 mL/min (ref >60)
Glucose, Bld: 116 mg/dL — ABNORMAL HIGH (ref 70–99)
Potassium: 3.9 mmol/L (ref 3.5–5.1)
SODIUM: 138 mmol/L (ref 135–145)

## 2018-07-10 LAB — C-REACTIVE PROTEIN: CRP: 2.3 mg/dL — ABNORMAL HIGH (ref <1.0)

## 2018-07-10 LAB — HEMOGLOBIN: HEMOGLOBIN: 14.7 g/dL (ref 13.0–17.0)

## 2018-07-10 LAB — SEDIMENTATION RATE: Sed Rate: 2 mm/hr (ref 0–16)

## 2018-07-10 SURGERY — IRRIGATION AND DEBRIDEMENT EXTREMITY
Anesthesia: General | Site: Ankle | Laterality: Right

## 2018-07-10 MED ORDER — LACTATED RINGERS IV SOLN
INTRAVENOUS | Status: DC
  Administered 2018-07-10: 11:00:00 via INTRAVENOUS

## 2018-07-10 MED ORDER — LIDOCAINE 2% (20 MG/ML) 5 ML SYRINGE
INTRAMUSCULAR | Status: AC
  Filled 2018-07-10: qty 5

## 2018-07-10 MED ORDER — ONDANSETRON HCL 4 MG PO TABS: 4.0000 mg | ORAL_TABLET | Freq: Four times a day (QID) | ORAL | Status: DC | PRN

## 2018-07-10 MED ORDER — DEXAMETHASONE SODIUM PHOSPHATE 10 MG/ML IJ SOLN
INTRAMUSCULAR | Status: DC | PRN
  Administered 2018-07-10: 5 mg via INTRAVENOUS

## 2018-07-10 MED ORDER — MIDAZOLAM HCL 2 MG/2ML IJ SOLN
INTRAMUSCULAR | Status: DC | PRN
  Administered 2018-07-10 (×2): 1 mg via INTRAVENOUS

## 2018-07-10 MED ORDER — METHOCARBAMOL 1000 MG/10ML IJ SOLN
500.0000 mg | Freq: Four times a day (QID) | INTRAVENOUS | Status: DC | PRN
  Filled 2018-07-10: qty 5

## 2018-07-10 MED ORDER — ROCURONIUM BROMIDE 10 MG/ML (PF) SYRINGE
PREFILLED_SYRINGE | INTRAVENOUS | Status: DC | PRN
  Administered 2018-07-10: 40 mg via INTRAVENOUS

## 2018-07-10 MED ORDER — FENTANYL CITRATE (PF) 100 MCG/2ML IJ SOLN
50.0000 ug | INTRAMUSCULAR | Status: DC | PRN
  Administered 2018-07-10 (×2): 50 ug via INTRAVENOUS

## 2018-07-10 MED ORDER — HYDROMORPHONE HCL 1 MG/ML IJ SOLN
1.0000 mg | INTRAMUSCULAR | Status: DC | PRN
  Administered 2018-07-10: 1 mg via INTRAVENOUS
  Filled 2018-07-10: qty 1

## 2018-07-10 MED ORDER — CEFAZOLIN SODIUM-DEXTROSE 2-4 GM/100ML-% IV SOLN
INTRAVENOUS | Status: AC
  Filled 2018-07-10: qty 100

## 2018-07-10 MED ORDER — LIDOCAINE 2% (20 MG/ML) 5 ML SYRINGE
INTRAMUSCULAR | Status: DC | PRN
  Administered 2018-07-10: 100 mg via INTRAVENOUS

## 2018-07-10 MED ORDER — POLYETHYLENE GLYCOL 3350 17 G PO PACK: 17.0000 g | PACK | Freq: Every day | ORAL | Status: DC | PRN

## 2018-07-10 MED ORDER — VANCOMYCIN HCL 10 G IV SOLR
1750.0000 mg | INTRAVENOUS | Status: DC
  Administered 2018-07-10 – 2018-07-13 (×4): 1750 mg via INTRAVENOUS
  Filled 2018-07-10 (×4): qty 1750

## 2018-07-10 MED ORDER — POVIDONE-IODINE 10 % EX SWAB: 2.0000 "application " | Freq: Once | CUTANEOUS | Status: DC

## 2018-07-10 MED ORDER — METOCLOPRAMIDE HCL 5 MG/ML IJ SOLN: 5.0000 mg | Freq: Three times a day (TID) | INTRAMUSCULAR | Status: DC | PRN

## 2018-07-10 MED ORDER — EPHEDRINE SULFATE-NACL 50-0.9 MG/10ML-% IV SOSY
PREFILLED_SYRINGE | INTRAVENOUS | Status: DC | PRN
  Administered 2018-07-10: 5 mg via INTRAVENOUS

## 2018-07-10 MED ORDER — ONDANSETRON HCL 4 MG/2ML IJ SOLN
INTRAMUSCULAR | Status: DC | PRN
  Administered 2018-07-10: 4 mg via INTRAVENOUS

## 2018-07-10 MED ORDER — FENTANYL CITRATE (PF) 100 MCG/2ML IJ SOLN
INTRAMUSCULAR | Status: AC
  Filled 2018-07-10: qty 2

## 2018-07-10 MED ORDER — FENTANYL CITRATE (PF) 250 MCG/5ML IJ SOLN
INTRAMUSCULAR | Status: DC | PRN
  Administered 2018-07-10: 50 ug via INTRAVENOUS
  Administered 2018-07-10: 150 ug via INTRAVENOUS

## 2018-07-10 MED ORDER — PROPOFOL 10 MG/ML IV BOLUS
INTRAVENOUS | Status: AC
  Filled 2018-07-10: qty 20

## 2018-07-10 MED ORDER — DOCUSATE SODIUM 100 MG PO CAPS
100.0000 mg | ORAL_CAPSULE | Freq: Two times a day (BID) | ORAL | Status: DC
  Administered 2018-07-10 – 2018-07-13 (×6): 100 mg via ORAL
  Filled 2018-07-10 (×7): qty 1

## 2018-07-10 MED ORDER — EPHEDRINE 5 MG/ML INJ
INTRAVENOUS | Status: AC
  Filled 2018-07-10: qty 30

## 2018-07-10 MED ORDER — OXYCODONE HCL 5 MG PO TABS
5.0000 mg | ORAL_TABLET | ORAL | Status: DC | PRN
  Administered 2018-07-10 – 2018-07-13 (×6): 5 mg via ORAL
  Filled 2018-07-10 (×7): qty 1

## 2018-07-10 MED ORDER — METHOCARBAMOL 500 MG PO TABS
500.0000 mg | ORAL_TABLET | Freq: Four times a day (QID) | ORAL | Status: DC | PRN
  Administered 2018-07-10 – 2018-07-13 (×5): 500 mg via ORAL
  Filled 2018-07-10 (×5): qty 1

## 2018-07-10 MED ORDER — PROPOFOL 10 MG/ML IV BOLUS
INTRAVENOUS | Status: DC | PRN
  Administered 2018-07-10: 180 mg via INTRAVENOUS

## 2018-07-10 MED ORDER — MIDAZOLAM HCL 2 MG/2ML IJ SOLN
INTRAMUSCULAR | Status: AC
  Filled 2018-07-10: qty 2

## 2018-07-10 MED ORDER — SODIUM CHLORIDE 0.9 % IV SOLN
INTRAVENOUS | Status: DC
  Administered 2018-07-10 – 2018-07-11 (×2): via INTRAVENOUS

## 2018-07-10 MED ORDER — MAGNESIUM CITRATE PO SOLN: 1.0000 | Freq: Once | ORAL | Status: DC | PRN

## 2018-07-10 MED ORDER — SUGAMMADEX SODIUM 200 MG/2ML IV SOLN
INTRAVENOUS | Status: DC | PRN
  Administered 2018-07-10: 160 mg via INTRAVENOUS

## 2018-07-10 MED ORDER — PHENYLEPHRINE 40 MCG/ML (10ML) SYRINGE FOR IV PUSH (FOR BLOOD PRESSURE SUPPORT)
PREFILLED_SYRINGE | INTRAVENOUS | Status: AC
  Filled 2018-07-10: qty 30

## 2018-07-10 MED ORDER — CEFAZOLIN SODIUM-DEXTROSE 2-4 GM/100ML-% IV SOLN
2.0000 g | INTRAVENOUS | Status: DC
  Administered 2018-07-10: 2 g via INTRAVENOUS

## 2018-07-10 MED ORDER — SODIUM CHLORIDE 0.9 % IV SOLN
2.0000 g | Freq: Two times a day (BID) | INTRAVENOUS | Status: DC
  Administered 2018-07-10 – 2018-07-11 (×3): 2 g via INTRAVENOUS
  Filled 2018-07-10 (×3): qty 2

## 2018-07-10 MED ORDER — BISACODYL 10 MG RE SUPP: 10.0000 mg | Freq: Every day | RECTAL | Status: DC | PRN

## 2018-07-10 MED ORDER — ONDANSETRON HCL 4 MG/2ML IJ SOLN: 4.0000 mg | Freq: Four times a day (QID) | INTRAMUSCULAR | Status: DC | PRN

## 2018-07-10 MED ORDER — ACETAMINOPHEN 325 MG PO TABS: 325.0000 mg | ORAL_TABLET | Freq: Four times a day (QID) | ORAL | Status: DC | PRN

## 2018-07-10 MED ORDER — OXYCODONE HCL 5 MG PO TABS
10.0000 mg | ORAL_TABLET | ORAL | Status: DC | PRN
  Administered 2018-07-10 – 2018-07-12 (×5): 10 mg via ORAL
  Filled 2018-07-10 (×6): qty 2

## 2018-07-10 MED ORDER — SUCCINYLCHOLINE CHLORIDE 20 MG/ML IJ SOLN
INTRAMUSCULAR | Status: DC | PRN
  Administered 2018-07-10: 120 mg via INTRAVENOUS

## 2018-07-10 MED ORDER — DIPHENHYDRAMINE HCL 12.5 MG/5ML PO ELIX: 12.5000 mg | ORAL_SOLUTION | ORAL | Status: DC | PRN

## 2018-07-10 MED ORDER — FENTANYL CITRATE (PF) 250 MCG/5ML IJ SOLN
INTRAMUSCULAR | Status: AC
  Filled 2018-07-10: qty 5

## 2018-07-10 MED ORDER — SENNA 8.6 MG PO TABS
1.0000 | ORAL_TABLET | Freq: Two times a day (BID) | ORAL | Status: DC
  Administered 2018-07-10 – 2018-07-13 (×6): 8.6 mg via ORAL
  Filled 2018-07-10 (×7): qty 1

## 2018-07-10 MED ORDER — DEXAMETHASONE SODIUM PHOSPHATE 10 MG/ML IJ SOLN
INTRAMUSCULAR | Status: AC
  Filled 2018-07-10: qty 2

## 2018-07-10 MED ORDER — METOCLOPRAMIDE HCL 5 MG PO TABS: 5.0000 mg | ORAL_TABLET | Freq: Three times a day (TID) | ORAL | Status: DC | PRN

## 2018-07-10 MED ORDER — SODIUM CHLORIDE 0.9 % IR SOLN
Status: DC | PRN
  Administered 2018-07-10: 3000 mL

## 2018-07-10 MED ORDER — VANCOMYCIN HCL 10 G IV SOLR
1500.0000 mg | Freq: Once | INTRAVENOUS | Status: DC
  Filled 2018-07-10: qty 1500

## 2018-07-10 MED ORDER — CELECOXIB 200 MG PO CAPS
200.0000 mg | ORAL_CAPSULE | Freq: Two times a day (BID) | ORAL | Status: DC
  Administered 2018-07-10 – 2018-07-13 (×6): 200 mg via ORAL
  Filled 2018-07-10 (×7): qty 1

## 2018-07-10 MED ORDER — 0.9 % SODIUM CHLORIDE (POUR BTL) OPTIME
TOPICAL | Status: DC | PRN
  Administered 2018-07-10: 1000 mL

## 2018-07-10 MED ORDER — ONDANSETRON HCL 4 MG/2ML IJ SOLN
INTRAMUSCULAR | Status: AC
  Filled 2018-07-10: qty 2

## 2018-07-10 SURGICAL SUPPLY — 55 items
BANDAGE ESMARK 6X9 LF (GAUZE/BANDAGES/DRESSINGS) ×1 IMPLANT
BENZOIN TINCTURE PRP APPL 2/3 (GAUZE/BANDAGES/DRESSINGS) ×9 IMPLANT
BLADE SURG 10 STRL SS (BLADE) IMPLANT
BNDG COHESIVE 4X5 TAN STRL (GAUZE/BANDAGES/DRESSINGS) ×3 IMPLANT
BNDG COHESIVE 6X5 TAN STRL LF (GAUZE/BANDAGES/DRESSINGS) ×3 IMPLANT
BNDG CONFORM 3 STRL LF (GAUZE/BANDAGES/DRESSINGS) IMPLANT
BNDG ESMARK 6X9 LF (GAUZE/BANDAGES/DRESSINGS) ×3
CANISTER SUCT 3000ML PPV (MISCELLANEOUS) IMPLANT
CANISTER WOUNDNEG PRESSURE 500 (CANNISTER) ×3 IMPLANT
CHLORAPREP W/TINT 26ML (MISCELLANEOUS) IMPLANT
COVER SURGICAL LIGHT HANDLE (MISCELLANEOUS) ×3 IMPLANT
COVER WAND RF STERILE (DRAPES) ×3 IMPLANT
CUFF TOURNIQUET SINGLE 34IN LL (TOURNIQUET CUFF) ×3 IMPLANT
CUFF TOURNIQUET SINGLE 44IN (TOURNIQUET CUFF) IMPLANT
DRESSING PEEL AND PLC PRVNA 13 (GAUZE/BANDAGES/DRESSINGS) ×1 IMPLANT
DRSG MEPITEL 4X7.2 (GAUZE/BANDAGES/DRESSINGS) IMPLANT
DRSG PAD ABDOMINAL 8X10 ST (GAUZE/BANDAGES/DRESSINGS) IMPLANT
DRSG PEEL AND PLACE PREVENA 13 (GAUZE/BANDAGES/DRESSINGS) ×3
ELECT REM PT RETURN 9FT ADLT (ELECTROSURGICAL) ×3
ELECTRODE REM PT RTRN 9FT ADLT (ELECTROSURGICAL) ×1 IMPLANT
EVACUATOR SILICONE 100CC (DRAIN) IMPLANT
GAUZE SPONGE 4X4 12PLY STRL (GAUZE/BANDAGES/DRESSINGS) IMPLANT
GLOVE BIO SURGEON STRL SZ8 (GLOVE) ×9 IMPLANT
GLOVE BIOGEL PI IND STRL 8 (GLOVE) ×1 IMPLANT
GLOVE BIOGEL PI INDICATOR 8 (GLOVE) ×2
GLOVE ECLIPSE 8.0 STRL XLNG CF (GLOVE) ×3 IMPLANT
GOWN STRL REUS W/ TWL XL LVL3 (GOWN DISPOSABLE) ×1 IMPLANT
GOWN STRL REUS W/TWL XL LVL3 (GOWN DISPOSABLE) ×2
KIT BASIN OR (CUSTOM PROCEDURE TRAY) ×3 IMPLANT
KIT DRSG PREVENA PLUS 7DAY 125 (MISCELLANEOUS) ×3 IMPLANT
KIT TURNOVER KIT B (KITS) ×3 IMPLANT
NS IRRIG 1000ML POUR BTL (IV SOLUTION) ×3 IMPLANT
PACK ORTHO EXTREMITY (CUSTOM PROCEDURE TRAY) ×3 IMPLANT
PAD ARMBOARD 7.5X6 YLW CONV (MISCELLANEOUS) ×12 IMPLANT
PAD CAST 4YDX4 CTTN HI CHSV (CAST SUPPLIES) ×1 IMPLANT
PADDING CAST COTTON 4X4 STRL (CAST SUPPLIES) ×2
PADDING CAST COTTON 6X4 STRL (CAST SUPPLIES) ×3 IMPLANT
SET CYSTO W/LG BORE CLAMP LF (SET/KITS/TRAYS/PACK) ×3 IMPLANT
SPLINT PLASTER CAST XFAST 5X30 (CAST SUPPLIES) ×1 IMPLANT
SPLINT PLASTER XFAST SET 5X30 (CAST SUPPLIES) ×2
SPONGE LAP 4X18 RFD (DISPOSABLE) IMPLANT
SUCTION FRAZIER HANDLE 10FR (MISCELLANEOUS) ×2
SUCTION TUBE FRAZIER 10FR DISP (MISCELLANEOUS) ×1 IMPLANT
SUT ETHILON 2 0 FS 18 (SUTURE) ×6 IMPLANT
SUT ETHILON 3 0 PS 1 (SUTURE) IMPLANT
SUT VIC AB 2-0 CT1 27 (SUTURE)
SUT VIC AB 2-0 CT1 TAPERPNT 27 (SUTURE) IMPLANT
SWAB COLLECTION DEVICE MRSA (MISCELLANEOUS) ×3 IMPLANT
SWAB CULTURE ESWAB REG 1ML (MISCELLANEOUS) ×3 IMPLANT
TOWEL OR 17X24 6PK STRL BLUE (TOWEL DISPOSABLE) IMPLANT
TOWEL OR 17X26 10 PK STRL BLUE (TOWEL DISPOSABLE) ×3 IMPLANT
TUBE CONNECTING 12'X1/4 (SUCTIONS) ×1
TUBE CONNECTING 12X1/4 (SUCTIONS) ×2 IMPLANT
TUBING CYSTO DISP (UROLOGICAL SUPPLIES) ×3 IMPLANT
YANKAUER SUCT BULB TIP NO VENT (SUCTIONS) ×3 IMPLANT

## 2018-07-10 NOTE — Op Note (Signed)
07/10/2018  11:56 AM  PATIENT:  Terry [name]  68 y.o. male  PRE-OPERATIVE DIAGNOSIS: 1.  Right ankle post op wound dehiscence      2.  Right ankle cellulitis  POST-OPERATIVE DIAGNOSIS: 1.  Right ankle post op wound dehiscence      2.  Right ankle cellulitis      3.  Right achilles tendon rupture   Procedure(s): 1.  Irrigation and excisional debridement of complex right ankle post op wound infection 2.  Closure of right ankle wound 3.  Application of negative pressure wound dressing to ankle wound 30 mm x 4 mm  SURGEON:  Wylene Simmer, MD  ASSISTANT: none  ANESTHESIA:   General  EBL:  minimal   TOURNIQUET:   Total Tourniquet Time Documented: Thigh (Right) - 30 minutes Total: Thigh (Right) - 30 minutes  COMPLICATIONS:  None apparent  DISPOSITION:  Extubated, awake and stable to recovery.  INDICATION FOR PROCEDURE: The patient is a 68 year old male who underwent right Achilles tendon reconstruction on June 07, 2018.  He was seen in the office last week with appropriate healing noted at his postop wound.  This morning in the shower he noticed a wound opened up and drained.  He presents now for irrigation and excisional debridement of the wound with attempted closure versus application of a wound VAC.  The risks and benefits of the alternative treatment options have been discussed in detail.  The patient wishes to proceed with surgery and specifically understands risks of bleeding, infection, nerve damage, blood clots, need for additional surgery, amputation and death.  PROCEDURE IN DETAIL: After preoperative consent was obtained and the correct operative site was identified, the patient was brought to the operating room supine on a stretcher.  General anesthesia was induced.  Surgical timeout was taken.  Preoperative antibiotics were held.  The patient was then turned into the prone position on the operating table with all bony prominences padded well.  The right lower extremity was  prepped and draped in standard sterile fashion with a tourniquet around the thigh.  The extremity was elevated and the tourniquet was inflated to 250 mmHg.  The patient's postoperative wound dehiscence was identified.  There was evidence of localized cellulitis around the wound.  The wound was extended proximally and distally along the previous incision.  The Achilles tendon was visible within the wound.  The Achilles repair was noted to be disrupted with recurrent rupture of the tendon.  The FHL transfer was noted to be intact and appeared generally healthy.  Deep cultures were obtained and sent to microbiology for aerobic and anaerobic culture.  Excisional debridement was then performed circumferentially around the wound with a scalpel and rondure.  The skin edges were debrided of all necrotic areas.  Proximally and distally the wound was nicely healed but centrally at the area of dehiscence the skin edges were necrotic.  Once this was excised sharp debridement was carried down through the area of the subcutaneous tissues and down to the level of the tendon.  The tendon ends were noted to be necrotic and this was all sharply excised as well.  This left a gap of approximately 4 cm.  The FHL was debrided at its superficial layer.  The wound was then irrigated copiously with 3 L of normal saline.  Excisional debridement was then repeated again with scalpel, curette and rondure from the level of the skin down through the subcutaneous tissues to the level of the FHL muscle.  The  surgical wound was then closed with horizontal mattress sutures of 3-0 nylon.  The resultant measured 30 mm x 4 mm at its largest dimension.  An incisional negative pressure dressing then applied after preparing the skin appropriately.  The dressing was hooked up to the VAC at 125 mmHg continuous, low intensity.  Sterile dressings were applied followed by a well-padded short leg splint.  The tourniquet was released after application of the  dressing at 30 minutes.  The patient was awakened from anesthesia and transported to the recovery room in stable condition.  IV cefazolin was administered after obtaining deep cultures.   FOLLOW UP PLAN: The patient will be admitted and started on vancomycin and cefepime pending culture results.  He will be nonweightbearing on the right lower extremity.  Once his wound is healed we will be able to assess whether he has sufficient active plantar flexion strength with his FHL only.  Revision reconstruction of the tendon will be delayed until his wound is appropriately healed and infection is cleared.

## 2018-07-10 NOTE — H&P (Addendum)
Terry [name] is an 68 y.o. male.   Chief Complaint:  Right ankle wound opening HPI:  The patient is a 68 y/o male with a h/o right achilles tendon reconstruction on 06/07/18.  He noted drainage from the wound on Friday.  This morning as he was showering he noted that the wound opened up and the tendon was exposed.  He called, and I instructed him to go to the ER at [facility].  He denies f/c/n/v/wt loss.  No pain.  He has not yet eaten or had anything to drink this morning.  Past Medical History:  Diagnosis Date  . Allergy    takes Zyrtec as needed  . Arthritis   . Asthma    pt denies  . Chronic back pain   . GERD (gastroesophageal reflux disease)    takes Omeprazole daily  . Hay fever   . History of colon polyps    benign  . Hypercholesterolemia    takes Crestor daily  . Pneumonia    "walking" pneumonia - as a child  . Prostate cancer ([place]) 04/13/13   gleason 6, vol 55.4 cc    Past Surgical History:  Procedure Laterality Date  . ANTERIOR CERVICAL DECOMP/DISCECTOMY FUSION N/A 06/27/2014   Procedure: ANTERIOR CERVICAL DECOMPRESSION/DISCECTOMY FUSION C5-C7   (2 LEVELS);  Surgeon: Melina Schools, MD;  Location: [facility];  Service: Orthopedics;  Laterality: N/A;  . CARDIAC CATHETERIZATION  2004  . COLONOSCOPY    . EYE SURGERY     lazy eye as child, lasik surgery  . FINGER SURGERY Right    4th finger  . KNEE ARTHROSCOPY Bilateral   . PROSTATE BIOPSY  04/13/13   gleason 3+3=6, vol 55.4 cc  . ROBOT ASSISTED LAPAROSCOPIC RADICAL PROSTATECTOMY N/A 05/22/2013   Procedure: ROBOTIC ASSISTED LAPAROSCOPIC RADICAL PROSTATECTOMY LEVEL 1;  Surgeon: Dutch Gray, MD;  Location: [facility];  Service: Urology;  Laterality: N/A;  . TONSILLECTOMY    . VASECTOMY      Family History  Problem Relation Age of Onset  . Pneumonia Mother        bacterial  . Dementia Mother   . Heart disease Father   . Heart attack Father   . Colon cancer Neg Hx    Social History:  reports that he has never  smoked. He has never used smokeless tobacco. He reports current alcohol use. He reports that he does not use drugs.  Allergies:  Allergies  Allergen Reactions  . Penicillins Hives and Other (See Comments)    Has patient had a PCN reaction causing immediate rash, facial/tongue/throat swelling, SOB or lightheadedness with hypotension: No Has patient had a PCN reaction causing SEVERE RASH INVOLVING MUCUS MEMBRANES or SKIN NECROSIS: #  #  #  YES  #  #  #  Has patient had a PCN reaction that required hospitalization No Has patient had a PCN reaction occurring within the last 10 years: No     No medications prior to admission.    No results found for this or any previous visit (from the past 48 hour(s)). No results found.  ROS  No recent f/c/nv//wt loss  There were no vitals taken for this visit. Physical Exam  wn wd male in nad.  A and O x 4.  Mood and affect normal.  EOMI.  resp unlabored.  R ankle with wound dehiscence of the distal ankle posterior wound.  No cellulitis.  Scant serous drainage.  Wound edges are necrotic.  Sens to  LT intact at the foot dorsal and plantar.  Active PF and DF of the ankle and toes.  Assessment/Plan R ankle wound dehiscence.  To the OR today for I and D and attempted closure.  Possible application of a wound VAC. The risks and benefits of the alternative treatment options have been discussed in detail.  The patient wishes to proceed with surgery and specifically understands risks of bleeding, infection, nerve damage, blood clots, need for additional surgery, amputation and death.  Hold abx for intra op cultures.   Wylene Simmer, MD 07/10/2018, 9:17 AM

## 2018-07-10 NOTE — Progress Notes (Signed)
Pharmacy Antibiotic Note  DEMETRES PROCHNOW is a 68 y.o. male admitted on 07/10/2018 with wound infection.  Pharmacy has been consulted for Vanco/Cefepime dosing.  CC/HPI: 2/9 I&D R ankle wound with vac  PMH:  Right achilles tendon rupture with reconstruction 06/07/18, R ankle cellulits and post-op wound dehiscence, allergies, arthritis, asthma, chronic back pain, GERD, HLD, prostate cancer  ID: Tmax 99.5. No WBC. Scr WNL  Vanco 2/9>> Cefepime 2/9>>  Vancomycin 1750 mg IV Q 24 hrs. Goal AUC 400-550. Expected AUC: 484 SCr used: 0.94   Plan: Vanco 1750mg  IV q 24h. Levels at steady state after 3-5 doses Cefepime 2g IV q 12 hrs    Height: 5\' 8"  (172.7 cm) Weight: 173 lb (78.5 kg) IBW/kg (Calculated) : 68.4  Temp (24hrs), Avg:98.1 F (36.7 C), Min:97.3 F (36.3 C), Max:99.5 F (37.5 C)  Recent Labs  Lab 07/10/18 1027  CREATININE 0.94    Estimated Creatinine Clearance: 71.8 mL/min (by C-G formula based on SCr of 0.94 mg/dL).    Allergies  Allergen Reactions  . Penicillins Hives, Itching and Other (See Comments)    Has patient had a PCN reaction causing immediate rash, facial/tongue/throat swelling, SOB or lightheadedness with hypotension: No Has patient had a PCN reaction causing SEVERE RASH INVOLVING MUCUS MEMBRANES or SKIN NECROSIS: #  #  #  YES  #  #  #  Has patient had a PCN reaction that required hospitalization No Has patient had a PCN reaction occurring within the last 10 years: No     Elijha Dedman S. Alford Highland, PharmD, BCPS Clinical Staff Pharmacist  Eilene Ghazi Stillinger 07/10/2018 1:40 PM

## 2018-07-10 NOTE — Anesthesia Procedure Notes (Signed)
Procedure Name: Intubation Date/Time: 07/10/2018 11:14 AM Performed by: Julieta Bellini, CRNA Pre-anesthesia Checklist: Patient identified, Emergency Drugs available, Suction available and Patient being monitored Patient Re-evaluated:Patient Re-evaluated prior to induction Oxygen Delivery Method: Circle system utilized Preoxygenation: Pre-oxygenation with 100% oxygen Induction Type: IV induction Ventilation: Mask ventilation without difficulty Laryngoscope Size: Mac and 4 Grade View: Grade I Tube type: Oral Tube size: 7.5 mm Number of attempts: 1 Airway Equipment and Method: Stylet Placement Confirmation: ETT inserted through vocal cords under direct vision,  positive ETCO2 and breath sounds checked- equal and bilateral Secured at: 23 cm Tube secured with: Tape Dental Injury: Teeth and Oropharynx as per pre-operative assessment

## 2018-07-10 NOTE — Anesthesia Preprocedure Evaluation (Addendum)
Anesthesia Evaluation  Patient identified by MRN, date of birth, ID band Patient awake    Reviewed: Allergy & Precautions, NPO status , Patient's Chart, lab work & pertinent test results  History of Anesthesia Complications (+) MALIGNANT HYPERTHERMIA  Airway Mallampati: II  TM Distance: >3 FB     Dental   Pulmonary asthma , pneumonia,    breath sounds clear to auscultation       Cardiovascular negative cardio ROS   Rhythm:Regular Rate:Normal     Neuro/Psych    GI/Hepatic Neg liver ROS, GERD  ,  Endo/Other  negative endocrine ROS  Renal/GU negative Renal ROS     Musculoskeletal   Abdominal   Peds  Hematology   Anesthesia Other Findings   Reproductive/Obstetrics                            Anesthesia Physical Anesthesia Plan  ASA: III  Anesthesia Plan: General   Post-op Pain Management:    Induction: Intravenous  PONV Risk Score and Plan: Ondansetron, Dexamethasone and Midazolam  Airway Management Planned: Oral ETT  Additional Equipment:   Intra-op Plan:   Post-operative Plan: Extubation in OR  Informed Consent: I have reviewed the patients History and Physical, chart, labs and discussed the procedure including the risks, benefits and alternatives for the proposed anesthesia with the patient or authorized representative who has indicated his/her understanding and acceptance.     Dental advisory given  Plan Discussed with: CRNA, Anesthesiologist and Surgeon  Anesthesia Plan Comments:        Anesthesia Quick Evaluation

## 2018-07-10 NOTE — Anesthesia Postprocedure Evaluation (Signed)
Anesthesia Post Note  Patient: Terry [name]  Procedure(s) Performed: IRRIGATION AND DEBRIDEMENT RIGHT ANKLE WOUND AND WOUND VAC PLACEMENT (Right Ankle)     Patient location during evaluation: PACU Anesthesia Type: General Level of consciousness: awake Pain management: pain level controlled Vital Signs Assessment: post-procedure vital signs reviewed and stable Respiratory status: spontaneous breathing Cardiovascular status: stable Postop Assessment: no apparent nausea or vomiting Anesthetic complications: no    Last Vitals:  Vitals:   07/10/18 1247 07/10/18 1300  BP: 139/76   Pulse: 65   Resp: 14   Temp:  36.4 C  SpO2: 94%     Last Pain:  Vitals:   07/10/18 1300  TempSrc:   PainSc: 3                  Dezzie Badilla

## 2018-07-10 NOTE — Transfer of Care (Signed)
Immediate Anesthesia Transfer of Care Note  Patient: Terry [name]  Procedure(s) Performed: IRRIGATION AND DEBRIDEMENT RIGHT ANKLE WOUND AND WOUND VAC PLACEMENT (Right Ankle)  Patient Location: PACU  Anesthesia Type:General  Level of Consciousness: awake, alert , oriented and patient cooperative  Airway & Oxygen Therapy: Patient Spontanous Breathing and Patient connected to nasal cannula oxygen  Post-op Assessment: Report given to RN, Post -op Vital signs reviewed and stable and Patient moving all extremities X 4  Post vital signs: Reviewed and stable  Last Vitals:  Vitals Value Taken Time  BP 140/71 07/10/2018 12:02 PM  Temp    Pulse 74 07/10/2018 12:04 PM  Resp 17 07/10/2018 12:04 PM  SpO2 96 % 07/10/2018 12:04 PM  Vitals shown include unvalidated device data.  Last Pain:  Vitals:   07/10/18 1032  TempSrc: Oral  PainSc: 0-No pain      Patients Stated Pain Goal: 3 (82/70/78 [id number])  Complications: No apparent anesthesia complications

## 2018-07-11 ENCOUNTER — Encounter (HOSPITAL_COMMUNITY): Payer: Self-pay | Admitting: Orthopedic Surgery

## 2018-07-11 DIAGNOSIS — Z8546 Personal history of malignant neoplasm of prostate: Secondary | ICD-10-CM

## 2018-07-11 DIAGNOSIS — T8131XA Disruption of external operation (surgical) wound, not elsewhere classified, initial encounter: Secondary | ICD-10-CM

## 2018-07-11 DIAGNOSIS — Z880 Allergy status to penicillin: Secondary | ICD-10-CM

## 2018-07-11 DIAGNOSIS — T8149XA Infection following a procedure, other surgical site, initial encounter: Secondary | ICD-10-CM

## 2018-07-11 DIAGNOSIS — B9689 Other specified bacterial agents as the cause of diseases classified elsewhere: Secondary | ICD-10-CM

## 2018-07-11 LAB — POCT I-STAT 4, (NA,K, GLUC, HGB,HCT)
Glucose, Bld: 114 mg/dL — ABNORMAL HIGH (ref 70–99)
HCT: 41 % (ref 39.0–52.0)
Hemoglobin: 13.9 g/dL (ref 13.0–17.0)
Potassium: 4.1 mmol/L (ref 3.5–5.1)
Sodium: 139 mmol/L (ref 135–145)

## 2018-07-11 MED ORDER — SODIUM CHLORIDE 0.9 % IV SOLN
2.0000 g | Freq: Three times a day (TID) | INTRAVENOUS | Status: DC
  Administered 2018-07-11 – 2018-07-13 (×6): 2 g via INTRAVENOUS
  Filled 2018-07-11 (×8): qty 2

## 2018-07-11 NOTE — Progress Notes (Addendum)
Subjective: 1 Day Post-Op Procedure(s) (LRB): IRRIGATION AND DEBRIDEMENT RIGHT ANKLE WOUND AND WOUND VAC PLACEMENT (Right)  Patient reports pain as mild to moderate.  Tolerating POs well.  Denies fever, chills, N/V, CP, SOB.  Resting comfortably in bed.  Objective:   VITALS:  Temp:  [97.3 F (36.3 C)-99.5 F (37.5 C)] 98.2 F (36.8 C) (02/10 0450) Pulse Rate:  [58-88] 58 (02/10 0450) Resp:  [9-20] 18 (02/10 0450) BP: (128-147)/(71-76) 145/73 (02/10 0450) SpO2:  [94 %-100 %] 95 % (02/10 0450) Weight:  [78.5 kg] 78.5 kg (02/09 1032)  General: WDWN patient in NAD. Psych:  Appropriate mood and affect. Neuro:  A&O x 3, Moving all extremities, sensation intact to light touch HEENT:  EOMs intact Chest:  Even non-labored respirations Skin:  SLS C/D/I, no rashes or lesions.  Wound vac intact. Extremities: warm/dry, no visible edema, erythema or echymosis.  No lymphadenopathy. Pulses: Popliteus 2+ MSK:  ROM: EHL/FHL, MMT: able to perform quad set    LABS Recent Labs    07/10/18 1027 07/10/18 1032  HGB 14.7 13.9   Recent Labs    07/10/18 1027 07/10/18 1032  NA 138 139  K 3.9 4.1  CL 105  --   CO2 23  --   BUN 15  --   CREATININE 0.94  --   GLUCOSE 116* 114*   No results for input(s): LABPT, INR in the last 72 hours.   Assessment/Plan: 1 Day Post-Op Procedure(s) (LRB): IRRIGATION AND DEBRIDEMENT RIGHT ANKLE WOUND AND WOUND VAC PLACEMENT (Right)  NWB R LE Cultures showing few gram (+) cocci in pairs, and rare gram (-) rods Final cultures pending. Continue vanc and cefepime for time being. Consult to ID. Need to change over to Prevena portable wound vac prior to D/C  Mechele Claude PA-C [facility] Office:  [phone]

## 2018-07-11 NOTE — Care Management Note (Addendum)
Case Management Note  Patient Details  Name: Terry [name] MRN: [id number] Date of Birth: Aug 30, 1950  Subjective/Objective:                    Action/Plan:  Patient from home with wife.   Has incisional VAC.  At home has knee scooter. Patient asking if he still uses the knee scooter , because " I have to wear a boot with a bubble on it where my knee rest on scooter". Called Dr [employer] office spoke with Tani,regarding same,. Per Sandrea Hughs PA no need for PT eval.   Patient aware ID consult, possible IV ABX at home . Medicare.gov list provided, if needed wants [facility].   Patient address is [address] , [place]  Home phone is [phone], cell [phone]  Spoke  Expected Discharge Date:                  Expected Discharge Plan:  [place]  In-House Referral:     Discharge planning Services  CM Consult  Post Acute Care Choice:  Home Health Choice offered to:  Patient  DME Arranged:    DME Agency:     HH Arranged:    [place] Agency:     Status of Service:  In process, will continue to follow  If discussed at Long Length of Stay Meetings, dates discussed:    Additional Comments:  Marilu Favre, RN 07/11/2018, 10:35 AM

## 2018-07-11 NOTE — Consult Note (Signed)
Terry [name]    Date of Admission:  07/10/2018     Total days of antibiotics 1               Reason for Consult: Post Surgical Wound Infection  Referring Provider: Doran Durand Primary Care Provider: Shon Baton, MD   Assessment/Plan:  Terry [name] is a 68 y/o male s/p Achilles tendon reconstruction on 06/07/18 presenting with post-surgical wound infection s/p irrigation and debridement on 06/09/18. Gram stains from surgical specimens showing gram positive cocci and gram negative rods. Agree continued broad spectrum coverage with narrowing pending culture results. Pain is adequately controlled with his medication regimen. Of note he has allergies/hives to penicillin but has been tolerating cefepime without adverse side effect at present.   1. Continue vancomycin and cefepime.  2. Monitor culture results and fever curve.  3. Check Hepatitis C antibody as he is born between 47-65 to complete routine health maintenance.  4. Post-op wound care per orthopedics.   Active Problems:   Wound dehiscence, surgical   Wound dehiscence, surgical, initial encounter   . celecoxib  200 mg Oral BID  . docusate sodium  100 mg Oral BID  . senna  1 tablet Oral BID     HPI: Terry [name] is a 68 y.o. male with previous medical history of prostate cancer (2014) and recent history of Achilles Tendon reconstruction on 06/07/18 admitted with wound drainage starting on 07/08/18 when he noted the wound opened while showering and the tendon exposed.   Dr. Doran Durand performed irrigation and excisional debridement of the right ankle with application of wound VAC on 07/10/18. There was evidence of cellulitis around the wound with recurrent rupture of the tear. Cultures were obtained.  He was not on antibiotics prior to the procedure with Cefazolin being administered after obtaining deep cultures. He was started on broad spectrum vancomycin and cefepime pending cultures. ID has been asked to  evaluate for antibiotic recommendations.   Terry [name] has been afebrile since admission. Surgical specimens with gram stain showing gram positive cocci and gram negative rods. Initial CRP was 2.3 with ESR of 2.   Initial injury occurred in April of 2019 when he tripped while in [place]. Conservative treatment was attempted with a cam walker for about 10 months, however was ultimately unsuccessful leading to surgical intervention on 06/07/18. He was healing well until last week when he was seen in the orthopedic office and noted to have drainage coming from the wound with dishisense occurring a couple of days later. Denies fevers, chills or sweats. Has not been on antibiotics for at least 1 month prior to his initial surgery. Allergy to penicillins includes hives and development of rash.    Review of Systems: Review of Systems  Constitutional: Negative for chills, fever and weight loss.  Respiratory: Negative for cough, shortness of breath and wheezing.   Cardiovascular: Negative for chest pain and leg swelling.  Gastrointestinal: Negative for abdominal pain, constipation, diarrhea, nausea and vomiting.  Musculoskeletal:       Positive for right ankle pain.   Skin: Negative for rash.     Past Medical History:  Diagnosis Date  . Allergy    takes Zyrtec as needed  . Arthritis   . Asthma    pt denies  . Chronic back pain   . GERD (gastroesophageal reflux [name])    takes Omeprazole daily  . Hay fever   . History of colon polyps  benign  . Hypercholesterolemia    takes Crestor daily  . Pneumonia    "walking" pneumonia - as a child  . Prostate cancer ([place]) 04/13/13   gleason 6, vol 55.4 cc    Social History   Tobacco Use  . Smoking status: Never Smoker  . Smokeless tobacco: Never Used  Substance Use Topics  . Alcohol use: Yes    Alcohol/week: 0.0 standard drinks    Comment: couple of drinks a week  . Drug use: No    Family History  Problem Relation Age  of Onset  . Pneumonia Mother        bacterial  . Dementia Mother   . Heart [name] Father   . Heart attack Father   . Colon cancer Neg Hx     Allergies  Allergen Reactions  . Penicillins Hives, Itching and Other (See Comments)    Has patient had a PCN reaction causing immediate rash, facial/tongue/throat swelling, SOB or lightheadedness with hypotension: No Has patient had a PCN reaction causing SEVERE RASH INVOLVING MUCUS MEMBRANES or SKIN NECROSIS: #  #  #  YES  #  #  #  Has patient had a PCN reaction that required hospitalization No Has patient had a PCN reaction occurring within the last 10 years: No     OBJECTIVE: Blood pressure (!) 145/73, pulse (!) 58, temperature 98.2 F (36.8 C), temperature source Oral, resp. rate 18, height '5\' 8"'  (1.727 m), weight 78.5 kg, SpO2 95 %.  Physical Exam Constitutional:      General: He is not in acute distress.    Appearance: He is well-developed.  Cardiovascular:     Rate and Rhythm: Normal rate and regular rhythm.     Heart sounds: Normal heart sounds.  Pulmonary:     Effort: Pulmonary effort is normal.     Breath sounds: Normal breath sounds.  Musculoskeletal:     Comments: Post surgical dressing is clean and dry with no evidence of shadowing. Toes are slightly cold with appropriate capillary refill. Sensation intact.   Skin:    General: Skin is warm and dry.  Neurological:     Mental Status: He is alert and oriented to person, place, and time.  Psychiatric:        Mood and Affect: Mood normal.     Lab Results Lab Results  Component Value Date   WBC 17.1 (H) 09/01/2016   HGB 13.9 07/10/2018   HCT 41.0 07/10/2018   MCV 83.8 09/01/2016   PLT 218 09/01/2016    Lab Results  Component Value Date   CREATININE 0.94 07/10/2018   BUN 15 07/10/2018   NA 139 07/10/2018   K 4.1 07/10/2018   CL 105 07/10/2018   CO2 23 07/10/2018   No results found for: ALT, AST, GGT, ALKPHOS, BILITOT   Microbiology: Recent Results (from the  past 240 hour(s))  Aerobic/Anaerobic Culture (surgical/deep wound)     Status: None (Preliminary result)   Collection Time: 07/10/18 11:47 AM  Result Value Ref Range Status   Specimen Description WOUND RIGHT ANKLE SWAB OF TISSUE  Final   Special Requests NONE  Final   Gram Stain   Final    RARE WBC PRESENT, PREDOMINANTLY PMN FEW GRAM POSITIVE COCCI IN PAIRS RARE GRAM NEGATIVE RODS Performed at [facility], [place] [address], [place], [place] [address]    Culture PENDING  Incomplete   Report Status PENDING  Incomplete     Terri Piedra, [place]  for [name] [phone] Pager  07/11/2018  9:28 AM

## 2018-07-11 NOTE — Progress Notes (Signed)
   07/11/18 1100  Clinical Encounter Type  Visited With Patient and family together  Visit Type Initial;Social support;Spiritual support;Psychological support  Spiritual Encounters  Spiritual Needs Emotional  Stress Factors  Patient Stress Factors Major life changes;Health changes  Family Stress Factors Loss of control   Intro visit, met w/ pt and his wife in rm.  Today is their 41st wedding anniversary.  Pt is struggling w/ the changes he will need to make to his lifestyle d/t surgery/medical condition and recovery.  He has been very physically active in past and is trying to figure out what this will mean for the future.  Spouse seems loving and attentive while insisting he make modifications.   They enjoy babysitting for their 43 mo granddaughter and are expecting another in Sept.  Pt was previously very involved w/ church mission trips and youth group.  Myra Gianotti resident, [phone]

## 2018-07-11 NOTE — Progress Notes (Signed)
[facility] Infusion Coordinator will follow pt with ID team to support home IV ABX at DC if needed/ordered.  If patient discharges after hours, please call [phone].   Terry [name] 07/11/2018, 10:18 AM

## 2018-07-11 NOTE — Discharge Instructions (Addendum)
Terry Simmer, MD [place]  Please read the following information regarding your care after surgery.  Medications  You only need a prescription for the narcotic pain medicine (ex. oxycodone, Percocet, Norco).  All of the other medicines listed below are available over the counter. X Aleve 2 pills twice a day for the first 3 days after surgery. X acetominophen (Tylenol) 650 mg every 4-6 hours as you need for minor to moderate pain X oxycodone as prescribed for severe pain X Antibiotics as prescribed by Infectious Disease.  Narcotic pain medicine (ex. oxycodone, Percocet, Vicodin) will cause constipation.  To prevent this problem, take the following medicines while you are taking any pain medicine. X docusate sodium (Colace) 100 mg twice a day X senna (Senokot) 2 tablets twice a day  X To help prevent blood clots, take a baby aspirin (81 mg) twice a day after surgery.  You should also get up every hour while you are awake to move around.    Weight Bearing X Do not bear any weight on the operated leg or foot.  Cast / Splint / Dressing X Keep your splint, cast or dressing clean and dry.  Dont put anything (coat hanger, pencil, etc) down inside of it.  If it gets damp, use a hair dryer on the cool setting to dry it.  If it gets soaked, call the office to schedule an appointment for a cast change.   After your dressing, cast or splint is removed; you may shower, but do not soak or scrub the wound.  Allow the water to run over it, and then gently pat it dry.  Swelling It is normal for you to have swelling where you had surgery.  To reduce swelling and pain, keep your toes above your nose for at least 3 days after surgery.  It may be necessary to keep your foot or leg elevated for several weeks.  If it hurts, it should be elevated.  Follow Up Call my office at [phone] when you are discharged from the hospital or surgery center to schedule an appointment to be seen two weeks  after surgery.  Call my office at [phone] if you develop a fever >101.5 F, nausea, vomiting, bleeding from the surgical site or severe pain.

## 2018-07-11 NOTE — Progress Notes (Signed)
PHARMACY NOTE:  ANTIMICROBIAL RENAL DOSAGE ADJUSTMENT  Current antimicrobial regimen includes a mismatch between antimicrobial dosage and estimated renal function.  As per policy approved by the Pharmacy & Therapeutics and Medical Executive Committees, the antimicrobial dosage will be adjusted accordingly.  Current antimicrobial dosage:  Cefepime 2 gm every 12 hours  Indication: Wound infection   Renal Function:  Estimated Creatinine Clearance: 71.8 mL/min (by C-G formula based on SCr of 0.94 mg/dL). []      On intermittent HD, scheduled: []      On CRRT    Antimicrobial dosage has been changed to: Cefepime 2 gm Q 8 hours   Additional comments: Increased dose to empirically cover for pseudomonas   Thank you for allowing pharmacy to be a part of this patient's care.  Jimmy Footman, PharmD, BCPS, BCIDP Infectious Diseases Clinical Pharmacist Phone: [phone] 07/11/2018 5:10 PM

## 2018-07-12 DIAGNOSIS — Z881 Allergy status to other antibiotic agents status: Secondary | ICD-10-CM

## 2018-07-12 DIAGNOSIS — L089 Local infection of the skin and subcutaneous tissue, unspecified: Secondary | ICD-10-CM

## 2018-07-12 DIAGNOSIS — T8140XA Infection following a procedure, unspecified, initial encounter: Secondary | ICD-10-CM

## 2018-07-12 LAB — HIV ANTIBODY (ROUTINE TESTING W REFLEX): HIV Screen 4th Generation wRfx: NONREACTIVE

## 2018-07-12 MED ORDER — DOCUSATE SODIUM 100 MG PO CAPS: 100.0000 mg | ORAL_CAPSULE | Freq: Two times a day (BID) | ORAL | 0 refills | Status: DC

## 2018-07-12 MED ORDER — OXYCODONE HCL 5 MG PO TABS: 5.0000 mg | ORAL_TABLET | ORAL | 0 refills | Status: AC | PRN

## 2018-07-12 MED ORDER — SENNA 8.6 MG PO TABS: 2.0000 | ORAL_TABLET | Freq: Two times a day (BID) | ORAL | 0 refills | Status: DC

## 2018-07-12 MED ORDER — ASPIRIN EC 81 MG PO TBEC: 81.0000 mg | DELAYED_RELEASE_TABLET | Freq: Two times a day (BID) | ORAL | 0 refills | Status: DC

## 2018-07-12 NOTE — Progress Notes (Signed)
Subjective: 2 Days Post-Op Procedure(s) (LRB): IRRIGATION AND DEBRIDEMENT RIGHT ANKLE WOUND AND WOUND VAC PLACEMENT (Right)  Patient reports pain as mild to moderate.  Tolerating POs well.  Admits to flatus.  Denies fever, chills, N/V, CP, SOB.  Anxious to go home.  Objective:   VITALS:  Temp:  [98.1 F (36.7 C)-98.4 F (36.9 C)] 98.1 F (36.7 C) (02/11 0536) Pulse Rate:  [49-72] 49 (02/11 0536) Resp:  [18-20] 18 (02/11 0536) BP: (139-144)/(74-87) 144/87 (02/11 0536) SpO2:  [97 %-99 %] 99 % (02/11 0536)  General: WDWN patient in NAD. Psych:  Appropriate mood and affect. Neuro:  A&O x 3, Moving all extremities, sensation intact to light touch HEENT:  EOMs intact Chest:  Even non-labored respirations Skin:  SLS C/D/I, no rashes or lesions.  Wound vac intact.  No drainage in vac Extremities: warm/dry, no visible edema, erythema or echymosis.  No lymphadenopathy. Pulses: Popliteus 2+ MSK:  ROM: EHL/FHL intact, MMT: able to perform quad set    LABS Recent Labs    07/10/18 1027 07/10/18 1032  HGB 14.7 13.9   Recent Labs    07/10/18 1027 07/10/18 1032  NA 138 139  K 3.9 4.1  CL 105  --   CO2 23  --   BUN 15  --   CREATININE 0.94  --   GLUCOSE 116* 114*   No results for input(s): LABPT, INR in the last 72 hours.   Assessment/Plan: 2 Days Post-Op Procedure(s) (LRB): IRRIGATION AND DEBRIDEMENT RIGHT ANKLE WOUND AND WOUND VAC PLACEMENT (Right)  NWB R LE Final cultures pending ABX per ID Scripts of oxycodone, aspirin, and bowel regime sent to patient's pharmacy. Patient will need to be changed over to [facility] portable wound vac prior to D/C. Plan on outpatient post-op visit with Dr. Doran Durand next week.  Mechele Claude PA-C [facility] Office:  [phone]

## 2018-07-12 NOTE — Progress Notes (Signed)
Subjective: Patient with multiple questions about how he acquired this infection.   Antibiotics:  Anti-infectives (From admission, onward)   Start     Dose/Rate Route Frequency Ordered Stop   07/11/18 2200  ceFEPIme (MAXIPIME) 2 g in sodium chloride 0.9 % 100 mL IVPB     2 g 200 mL/hr over 30 Minutes Intravenous Every 8 hours 07/11/18 1640     07/10/18 1430  ceFEPIme (MAXIPIME) 2 g in sodium chloride 0.9 % 100 mL IVPB  Status:  Discontinued     2 g 200 mL/hr over 30 Minutes Intravenous Every 12 hours 07/10/18 1334 07/11/18 1640   07/10/18 1430  vancomycin (VANCOCIN) 1,750 mg in sodium chloride 0.9 % 500 mL IVPB     1,750 mg 250 mL/hr over 120 Minutes Intravenous Every 24 hours 07/10/18 1340     07/10/18 1345  vancomycin (VANCOCIN) 1,500 mg in sodium chloride 0.9 % 500 mL IVPB  Status:  Discontinued     1,500 mg 250 mL/hr over 120 Minutes Intravenous  Once 07/10/18 1334 07/10/18 1339   07/10/18 1045  ceFAZolin (ANCEF) 2-4 GM/100ML-% IVPB    Note to Pharmacy:  Laurita Quint   : cabinet override      07/10/18 1045 07/10/18 1119   07/10/18 1015  ceFAZolin (ANCEF) IVPB 2g/100 mL premix  Status:  Discontinued     2 g 200 mL/hr over 30 Minutes Intravenous On call to O.R. 07/10/18 1002 07/10/18 1031      Medications: Scheduled Meds: . celecoxib  200 mg Oral BID  . docusate sodium  100 mg Oral BID  . senna  1 tablet Oral BID   Continuous Infusions: . sodium chloride 10 mL/hr at 07/12/18 0655  . ceFEPime (MAXIPIME) IV Stopped (07/12/18 1610)  . methocarbamol (ROBAXIN) IV    . vancomycin Stopped (07/11/18 1706)   PRN Meds:.acetaminophen, bisacodyl, diphenhydrAMINE, HYDROmorphone (DILAUDID) injection, magnesium citrate, methocarbamol **OR** methocarbamol (ROBAXIN) IV, metoCLOPramide **OR** metoCLOPramide (REGLAN) injection, ondansetron **OR** ondansetron (ZOFRAN) IV, oxyCODONE, oxyCODONE, polyethylene glycol    Objective: Weight change:   Intake/Output Summary (Last  24 hours) at 07/12/2018 1010 Last data filed at 07/12/2018 0655 Gross per 24 hour  Intake 2373.04 ml  Output 1350 ml  Net 1023.04 ml   Blood pressure (!) 144/87, pulse (!) 49, temperature 98.1 F (36.7 C), temperature source Oral, resp. rate 18, height 5\' 8"  (1.727 m), weight 78.5 kg, SpO2 99 %. Temp:  [98.1 F (36.7 C)-98.4 F (36.9 C)] 98.1 F (36.7 C) (02/11 0536) Pulse Rate:  [49-72] 49 (02/11 0536) Resp:  [18-20] 18 (02/11 0536) BP: (139-144)/(74-87) 144/87 (02/11 0536) SpO2:  [97 %-99 %] 99 % (02/11 0536)  Physical Exam: General: Alert and awake, oriented x3, not in any acute distress. HEENT: anicteric sclera, EOMI CVS regular rate, normal  Chest: , no wheezing, no respiratory distress Abdomen: soft non-distended,  Ankle wrapped in dressing Neuro: nonfocal  CBC:    BMET Recent Labs    07/10/18 1027 07/10/18 1032  NA 138 139  K 3.9 4.1  CL 105  --   CO2 23  --   GLUCOSE 116* 114*  BUN 15  --   CREATININE 0.94  --   CALCIUM 9.4  --      Liver Panel  No results for input(s): PROT, ALBUMIN, AST, ALT, ALKPHOS, BILITOT, BILIDIR, IBILI in the last 72 hours.     Sedimentation Rate Recent Labs    07/10/18 1428  ESRSEDRATE 2  C-Reactive Protein Recent Labs    07/10/18 1428  CRP 2.3*    Micro Results: Recent Results (from the past 720 hour(s))  Aerobic/Anaerobic Culture (surgical/deep wound)     Status: None (Preliminary result)   Collection Time: 07/10/18 11:47 AM  Result Value Ref Range Status   Specimen Description WOUND RIGHT ANKLE SWAB OF TISSUE  Final   Special Requests NONE  Final   Gram Stain   Final    RARE WBC PRESENT, PREDOMINANTLY PMN FEW GRAM POSITIVE COCCI IN PAIRS RARE GRAM NEGATIVE RODS Performed at [facility], [place] [address] [place], [place] [address]    Culture CULTURE REINCUBATED FOR BETTER GROWTH  Final   Report Status PENDING  Incomplete    Studies/Results: No results  found.    Assessment/Plan:  INTERVAL HISTORY: Cultures reintubated for growth   Active Problems:   Wound dehiscence, surgical   Wound dehiscence, surgical, initial encounter    Terry [name] is a 68 y.o. male with s/p Achilles tendon reconstruction on 06/07/18 presenting with post-surgical wound infection s/p irrigation and debridement on 06/09/18 stain showing gram-positive cocci and a gram-negative rod but cultures so far not yielding an organism having been reactivated for further growth.  #1 postoperative infection of soft tissue and tendon:  Continue vancomycin and cefepime  I raise the idea of oritavancin and told him that we would need to make sure this drug was covered by his insurance.  I think he was confused about the fact that I am not certainly going to want to prescribe this to him if his insurance would not cover it.  Now continue above antibiotics should he be wanting to leave her team be wanting to discharge him then I would consider the oritavancin plus potentially ciprofloxacin for gram-negative's versus Zyvox and ciprofloxacin  (He apparently is allergic to doxycycline and I am not a big fan of high-dose Bactrim)   LOS: 2 days   Rhina [place] 07/12/2018, 10:10 AM

## 2018-07-12 NOTE — Consult Note (Signed)
[facility] CM Primary Care Navigator  07/12/2018  Terry [name] [birth year] [id number]   Met with patient and wife Terry [name]) at the bedside to identify possible discharge needs.  Patient reportsthat he recently had right achilles tendon reconstruction and noted that his right ankle wound opened up which had led to this admission.(right ankle wound dehiscence status post irrigation/ debridement and attempted closure with application of a wound VAC)  Patient endorsesDr.John Virgina [name] with [facility] as hisprimary care provider.   [facility] [place] [facility].   Patientstatesthathe hasbeenmanaginghis medications at home using "pill box" system filled once a week.   Patientverbalizedthat he has been driving prior to his surgery but wife will be able toprovide transportation to his doctors' appointments after discharge.  Patientreportslivingwith wife who will serve as his primary caregiverat home.  Anticipated discharge plan ishomewith home health serviceswhen ready- according to patient.  Patientvoiced understanding to call primary care provider's office for a post discharge follow-up appointment within1- 2 weeks orsooner if needs arise. Patient letter (with PCP's contact number) wasprovided asa reminder.   Discussed with patientregarding [facility]-CM services available for health management andresourcesat home but he denies any current needs orconcernsat this point.  Patientexpressed understanding of needto seekreferral from primary care provider to [facility] care management ifdeemed necessary and appropriate for any services in thefuture.  [facility] care management information was provided for futureneeds thathemay have.  Patienthowever,verbally agreedand optedfor EMMI calls to follow-up withhisrecovery at home.   Referral made for [facility] calls  after discharge.  Primary care provider's office is listed as providing transition of care (TOC).    For additional questions please contact:  Terry [name], BSN, RN-BC [facility] PRIMARY CARE Navigator Cell: [phone]

## 2018-07-13 ENCOUNTER — Encounter (HOSPITAL_COMMUNITY): Payer: Self-pay | Admitting: General Practice

## 2018-07-13 DIAGNOSIS — B958 Unspecified staphylococcus as the cause of diseases classified elsewhere: Secondary | ICD-10-CM

## 2018-07-13 LAB — CREATININE, SERUM
CREATININE: 0.98 mg/dL (ref 0.61–1.24)
GFR calc Af Amer: >60 mL/min (ref >60)
GFR calc non Af Amer: >60 mL/min (ref >60)

## 2018-07-13 MED ORDER — PANTOPRAZOLE SODIUM 40 MG PO TBEC
40.0000 mg | DELAYED_RELEASE_TABLET | Freq: Every day | ORAL | Status: DC
  Administered 2018-07-13: 40 mg via ORAL
  Filled 2018-07-13: qty 1

## 2018-07-13 MED ORDER — METRONIDAZOLE 500 MG PO TABS: 500.0000 mg | ORAL_TABLET | Freq: Three times a day (TID) | ORAL | 0 refills | Status: AC

## 2018-07-13 MED ORDER — LINEZOLID 600 MG PO TABS: 600.0000 mg | ORAL_TABLET | Freq: Two times a day (BID) | ORAL | 0 refills | Status: AC

## 2018-07-13 MED FILL — LINEZOLID 600 MG TABS: 600 | 14 days supply | Qty: 28 | Fill #0

## 2018-07-13 MED FILL — metroNIDAZOLE 500 MG TABS: 500 | 14 days supply | Qty: 42 | Fill #0

## 2018-07-13 NOTE — Progress Notes (Signed)
Pt requesting his Prilosec as at home.

## 2018-07-13 NOTE — Care Management Note (Signed)
Case Management Note  Patient Details  Name: ACHILLES NEVILLE MRN: [id number] Date of Birth: [birth year]  Subjective/Objective:                    Action/Plan:  Patient to receive PO ABX through TOC. Bedside nurse will change VAC to Prevena portable wound  Expected Discharge Date:                  Expected Discharge Plan:  Home/Self Care  In-House Referral:     Discharge planning Services  CM Consult  Post Acute Care Choice:  Home Health Choice offered to:  Patient  DME Arranged:  N/A DME Agency:  NA  HH Arranged:  NA HH Agency:  NA  Status of Service:  Completed, signed off  If discussed at [place] of Stay Meetings, dates discussed:    Additional Comments:  Marilu Favre, RN 07/13/2018, 11:18 AM

## 2018-07-13 NOTE — Plan of Care (Signed)

## 2018-07-13 NOTE — Progress Notes (Signed)
Pt for DC home.  Prevena hooked up to wound VAC, pt has antibiotics, DC instructions, no questions about follow up appointments or instructions.

## 2018-07-13 NOTE — Progress Notes (Signed)
Subjective:  Pt wishes to go home today  Antibiotics:  Anti-infectives (From admission, onward)   Start     Dose/Rate Route Frequency Ordered Stop   07/11/18 2200  ceFEPIme (MAXIPIME) 2 g in sodium chloride 0.9 % 100 mL IVPB     2 g 200 mL/hr over 30 Minutes Intravenous Every 8 hours 07/11/18 1640     07/10/18 1430  ceFEPIme (MAXIPIME) 2 g in sodium chloride 0.9 % 100 mL IVPB  Status:  Discontinued     2 g 200 mL/hr over 30 Minutes Intravenous Every 12 hours 07/10/18 1334 07/11/18 1640   07/10/18 1430  vancomycin (VANCOCIN) 1,750 mg in sodium chloride 0.9 % 500 mL IVPB     1,750 mg 250 mL/hr over 120 Minutes Intravenous Every 24 hours 07/10/18 1340     07/10/18 1345  vancomycin (VANCOCIN) 1,500 mg in sodium chloride 0.9 % 500 mL IVPB  Status:  Discontinued     1,500 mg 250 mL/hr over 120 Minutes Intravenous  Once 07/10/18 1334 07/10/18 1339   07/10/18 1045  ceFAZolin (ANCEF) 2-4 GM/100ML-% IVPB    Note to Pharmacy:  Laurita Quint   : cabinet override      07/10/18 1045 07/10/18 1119   07/10/18 1015  ceFAZolin (ANCEF) IVPB 2g/100 mL premix  Status:  Discontinued     2 g 200 mL/hr over 30 Minutes Intravenous On call to O.R. 07/10/18 1002 07/10/18 1031      Medications: Scheduled Meds: . celecoxib  200 mg Oral BID  . docusate sodium  100 mg Oral BID  . senna  1 tablet Oral BID   Continuous Infusions: . sodium chloride 10 mL/hr at 07/12/18 0655  . ceFEPime (MAXIPIME) IV 2 g (07/13/18 0604)  . methocarbamol (ROBAXIN) IV    . vancomycin 1,750 mg (07/12/18 1424)   PRN Meds:.acetaminophen, bisacodyl, diphenhydrAMINE, HYDROmorphone (DILAUDID) injection, magnesium citrate, methocarbamol **OR** methocarbamol (ROBAXIN) IV, metoCLOPramide **OR** metoCLOPramide (REGLAN) injection, ondansetron **OR** ondansetron (ZOFRAN) IV, oxyCODONE, oxyCODONE, polyethylene glycol    Objective: Weight change:   Intake/Output Summary (Last 24 hours) at 07/13/2018 1035 Last data filed at  07/13/2018 9622 Gross per 24 hour  Intake 400 ml  Output 2100 ml  Net -1700 ml   Blood pressure (!) 142/80, pulse 69, temperature 98 F (36.7 C), temperature source Oral, resp. rate 18, height 5\' 8"  (1.727 m), weight 78.5 kg, SpO2 99 %. Temp:  [97.6 F (36.4 C)-98.6 F (37 C)] 98 F (36.7 C) (02/12 0441) Pulse Rate:  [64-69] 69 (02/12 0441) Resp:  [18-19] 18 (02/12 0441) BP: (118-142)/(78-88) 142/80 (02/12 0441) SpO2:  [96 %-99 %] 99 % (02/12 0441)  Physical Exam: General: Alert and awake, oriented x3, not in any acute distress. HEENT: anicteric sclera, EOMI CVS regular rate, normal  Chest: , no wheezing, no respiratory distress Abdomen: soft non-distended,  Ankle wrapped in dressing Neuro: nonfocal  CBC:    BMET Recent Labs    07/13/18 0308  CREATININE 0.98     Liver Panel  No results for input(s): PROT, ALBUMIN, AST, ALT, ALKPHOS, BILITOT, BILIDIR, IBILI in the last 72 hours.     Sedimentation Rate Recent Labs    07/10/18 1428  ESRSEDRATE 2   C-Reactive Protein Recent Labs    07/10/18 1428  CRP 2.3*    Micro Results: Recent Results (from the past 720 hour(s))  Aerobic/Anaerobic Culture (surgical/deep wound)     Status: None (Preliminary result)   Collection Time: 07/10/18  11:47 AM  Result Value Ref Range Status   Specimen Description WOUND RIGHT ANKLE SWAB OF TISSUE  Final   Special Requests NONE  Final   Gram Stain   Final    RARE WBC PRESENT, PREDOMINANTLY PMN FEW GRAM POSITIVE COCCI IN PAIRS RARE GRAM NEGATIVE RODS Performed at [facility], [place] [address], [place], [place] [address]    Culture   Final    MODERATE UNIDENTIFIED ORGANISM IDENTIFICATION AND SUSCEPTIBILITIES TO FOLLOW NO ANAEROBES ISOLATED; CULTURE IN PROGRESS FOR 5 DAYS    Report Status PENDING  Incomplete    Studies/Results: No results found.    Assessment/Plan:  INTERVAL HISTORY:  Micro data see below   Active Problems:   Wound dehiscence, surgical    Wound dehiscence, surgical, initial encounter    Terry [name] is a 68 y.o. male with s/p Achilles tendon reconstruction on 06/07/18 presenting with post-surgical wound infection s/p irrigation and debridement on 06/09/18 stain showing gram-positive cocci and a gram-negative rod cultures are growing a staph species and skin flora it appears though awaiting on MALDI. He does NOT have GNR aerobe but could have an anerobe  #1 postoperative infection of soft tissue and tendon:  Will try to send him out with 2 weeks of zyvox and metronidazole   Appt with me at 945am on 07/26/2018    LOS: 3 days   Alcide Evener 07/13/2018, 10:35 AM

## 2018-07-13 NOTE — Care Management Important Message (Signed)
Important Message  Patient Details  Name: Terry [name] MRN: [id number] Date of Birth: [birth year]   Medicare Important Message Given:  Yes    Orbie Pyo 07/13/2018, 3:51 PM

## 2018-07-13 NOTE — Progress Notes (Signed)
Subjective: 3 Days Post-Op Procedure(s) (LRB): IRRIGATION AND DEBRIDEMENT RIGHT ANKLE WOUND AND WOUND VAC PLACEMENT (Right)  Patient reports pain as mild to moderate.  Tolerating POs well.  Admits to flatus.  Reports that he is ready to go home as soon as possible.  Objective:   VITALS:  Temp:  [97.6 F (36.4 C)-98.6 F (37 C)] 98 F (36.7 C) (02/12 0441) Pulse Rate:  [64-69] 69 (02/12 0441) Resp:  [18-19] 18 (02/12 0441) BP: (118-142)/(78-88) 142/80 (02/12 0441) SpO2:  [96 %-99 %] 99 % (02/12 0441)  General: WDWN patient in NAD. Psych:  Appropriate mood and affect. Neuro:  A&O x 3, Moving all extremities, sensation intact to light touch HEENT:  EOMs intact Chest:  Even non-labored respirations Skin:  SLS C/D/I, no rashes or lesions.  Wound vac intact.  No drainage in vac Extremities: warm/dry, no visible edema, erythema or echymosis.  No lymphadenopathy. Pulses: Popliteus 2+ MSK:  ROM: EHL/FHL intact, MMT: able to perform quad set    LABS Recent Labs    07/10/18 1027 07/10/18 1032  HGB 14.7 13.9   Recent Labs    07/10/18 1027 07/10/18 1032 07/13/18 0308  NA 138 139  --   K 3.9 4.1  --   CL 105  --   --   CO2 23  --   --   BUN 15  --   --   CREATININE 0.94  --  0.98  GLUCOSE 116* 114*  --    No results for input(s): LABPT, INR in the last 72 hours.   Assessment/Plan: 3 Days Post-Op Procedure(s) (LRB): IRRIGATION AND DEBRIDEMENT RIGHT ANKLE WOUND AND WOUND VAC PLACEMENT (Right)  NWB R LE Final cultures pending ABX per ID Pain, bowel regime, and DVT prophylaxis scripts sent to patient's pharmacy Plan for 1 week outpatient post-op visit with Dr. Doran Durand.  Patient will need to be changed over to [facility] portable wound vac prior to D/C. Please let me know when patient is ready for D/C and I will place order.  Mechele Claude PA-C [facility] Office:  [phone]

## 2018-07-13 NOTE — Discharge Summary (Signed)
Physician Discharge Summary  Patient ID: Terry [name] MRN: [id number] DOB/AGE: [birth year] 70 y.o.  Admit date: 07/10/2018 Discharge date: 07/13/2018  Admission Diagnoses: R LE surgical wound dehiscence; hx of prostate CA, neck pain, spondylolithesis  Discharge Diagnoses:  Active Problems:   Wound dehiscence, surgical   Wound dehiscence, surgical, initial encounter as stated above  Discharged Condition: stable  Hospital Course: Patient presented to [facility] ED on 07/10/18.  He underwent R achilles tendon reconstruction by Dr. Wylene Simmer on 06/07/18.  He noted discharge from the incision site on 07/08/18.  He was in the shower the morning of 07/10/18 when the wound opened up and the tendon was exposed.  Dr. Wylene Simmer was consulted upon arrival to the ED.  The patient was urgently taken to the [facility] for I&D of R surgical wound dehiscence and application of wound vac.  Intra-op cultures were obtained.  The patient tolerated the procedure well and was admitted to the hospital.  Empiric ABX treatment of cefepime and vancomycin.  Infectious disease was then consulted.  Intra-op cultures grew gram positive cocci and few gram negative rods.  The patient is to be D/C'd home on 07/13/18 on oral zyvox and metronidazole x 14 days per ID recommendation.  The patient was transitioned to a portal prevena wound vac.  He tolerated his hospital stay well without complication.  Consults: ID  Significant Diagnostic Studies: microbiology: wound culture: positive for gram positive cocci and few gram negative rods  Treatments: IV hydration, antibiotics: Ancef, vancomycin, metronidazole, cefepime, and zyvox, analgesia: acetaminophen, acetaminophen w/ codeine, Vicodin and Dilaudid, and surgery: as stated above.  Discharge Exam: Blood pressure (!) 142/80, pulse 69, temperature 98 F (36.7 C), temperature source Oral, resp. rate 18, height 5\' 8"  (1.727 m), weight 78.5 kg, SpO2 99 %. General: WDWN patient in  NAD. Psych:  Appropriate mood and affect. Neuro:  A&O x 3, Moving all extremities, sensation intact to light touch HEENT:  EOMs intact Chest:  Even non-labored respirations Skin: SLS C/D/I, no rashes or lesions.  Wound vac intact.  No fluid in vac. Extremities: warm/dry, no visible edema, erythema or echymosis.  No lymphadenopathy. Pulses: Popliteus 2+ MSK:  ROM: EHL/FHL intact, MMT: able to perform quad set   Disposition: Discharge disposition: 01-Home or Self Care       Discharge Instructions    Call MD / Call 911   Complete by:  As directed    If you experience chest pain or shortness of breath, CALL 911 and be transported to the hospital emergency room.  If you develope a fever above 101 F, pus (white drainage) or increased drainage or redness at the wound, or calf pain, call your surgeon's office.   Constipation Prevention   Complete by:  As directed    Drink plenty of fluids.  Prune juice may be helpful.  You may use a stool softener, such as Colace (over the counter) 100 mg twice a day.  Use MiraLax (over the counter) for constipation as needed.   Diet - low sodium heart healthy   Complete by:  As directed    Increase activity slowly as tolerated   Complete by:  As directed    Non weight bearing   Complete by:  As directed    Laterality:  right   Extremity:  Lower     Allergies as of 07/13/2018      Reactions   Penicillins Hives, Itching, Other (See Comments)   Has patient had a  PCN reaction causing immediate rash, facial/tongue/throat swelling, SOB or lightheadedness with hypotension: No Has patient had a PCN reaction causing SEVERE RASH INVOLVING MUCUS MEMBRANES or SKIN NECROSIS: #  #  #  YES  #  #  #  Has patient had a PCN reaction that required hospitalization No Has patient had a PCN reaction occurring within the last 10 years: No      Medication List    STOP taking these medications   HYDROcodone-acetaminophen 5-325 MG tablet Commonly known as:   NORCO/VICODIN     TAKE these medications   aspirin EC 81 MG tablet Take 1 tablet (81 mg total) by mouth 2 (two) times daily.   cetirizine 10 MG tablet Commonly known as:  ZYRTEC Take 10 mg by mouth daily as needed for allergies.   cycloSPORINE 0.05 % ophthalmic emulsion Commonly known as:  RESTASIS Place 1 drop into both eyes every other day.   dexamethasone 1 MG tablet Commonly known as:  DECADRON 2 tablets twice daily for 2 days, one tablet twice daily for 2 days, one tablet daily for 2 days.   diphenhydrAMINE 25 MG tablet Commonly known as:  BENADRYL Take 25 mg by mouth at bedtime as needed for sleep.   docusate sodium 100 MG capsule Commonly known as:  COLACE Take 1 capsule (100 mg total) by mouth 2 (two) times daily. While taking narcotic pain medicine.   linezolid 600 MG tablet Commonly known as:  ZYVOX Take 1 tablet (600 mg total) by mouth 2 (two) times daily for 14 days.   methocarbamol 500 MG tablet Commonly known as:  ROBAXIN Take 1 tablet (500 mg total) by mouth every 6 (six) hours as needed for muscle spasms.   metroNIDAZOLE 500 MG tablet Commonly known as:  FLAGYL Take 1 tablet (500 mg total) by mouth 3 (three) times daily for 14 days.   multivitamin tablet Take 1 tablet by mouth daily.   naproxen sodium 220 MG tablet Commonly known as:  ALEVE Take 220 mg by mouth daily as needed (pain).   omeprazole 20 MG tablet Commonly known as:  PRILOSEC OTC Take 20 mg by mouth daily.   oxyCODONE 5 MG immediate release tablet Commonly known as:  ROXICODONE Take 1 tablet (5 mg total) by mouth every 4 (four) hours as needed for up to 5 days for moderate pain or severe pain.   senna 8.6 MG Tabs tablet Commonly known as:  SENOKOT Take 2 tablets (17.2 mg total) by mouth 2 (two) times daily.            Discharge Care Instructions  (From admission, onward)         Start     Ordered   07/13/18 0000  Non weight bearing    Question Answer Comment   Laterality right   Extremity Lower      07/13/18 1143         Follow-up Information    Wylene Simmer, MD. Schedule an appointment as soon as possible for a visit in 1 week(s).   Specialty:  Orthopedic Surgery Contact information: [address] [place] [address] [phone]           Signed: Mohammed Kindle Office:  [phone]

## 2018-07-15 LAB — AEROBIC/ANAEROBIC CULTURE W GRAM STAIN (SURGICAL/DEEP WOUND)

## 2018-07-15 LAB — AEROBIC/ANAEROBIC CULTURE (SURGICAL/DEEP WOUND)

## 2018-07-18 DIAGNOSIS — E7849 Other hyperlipidemia: Secondary | ICD-10-CM | POA: Diagnosis not present

## 2018-07-18 DIAGNOSIS — R740 Nonspecific elevation of levels of transaminase and lactic acid dehydrogenase [LDH]: Secondary | ICD-10-CM | POA: Diagnosis not present

## 2018-07-18 DIAGNOSIS — T8142XA Infection following a procedure, deep incisional surgical site, initial encounter: Secondary | ICD-10-CM | POA: Diagnosis not present

## 2018-07-18 DIAGNOSIS — K219 Gastro-esophageal reflux disease without esophagitis: Secondary | ICD-10-CM | POA: Diagnosis not present

## 2018-07-26 ENCOUNTER — Encounter: Payer: Self-pay | Admitting: Infectious Disease

## 2018-07-26 ENCOUNTER — Ambulatory Visit (INDEPENDENT_AMBULATORY_CARE_PROVIDER_SITE_OTHER): Payer: Medicare Other | Admitting: Infectious Disease

## 2018-07-26 VITALS — BP 150/83 | HR 92 | Temp 98.0°F | Ht 68.0 in | Wt 177.5 lb

## 2018-07-26 DIAGNOSIS — Z792 Long term (current) use of antibiotics: Secondary | ICD-10-CM

## 2018-07-26 DIAGNOSIS — B957 Other staphylococcus as the cause of diseases classified elsewhere: Secondary | ICD-10-CM | POA: Diagnosis not present

## 2018-07-26 DIAGNOSIS — L089 Local infection of the skin and subcutaneous tissue, unspecified: Secondary | ICD-10-CM

## 2018-07-26 DIAGNOSIS — M6510 Other infective (teno)synovitis, unspecified site: Secondary | ICD-10-CM

## 2018-07-26 DIAGNOSIS — T8149XD Infection following a procedure, other surgical site, subsequent encounter: Secondary | ICD-10-CM

## 2018-07-26 DIAGNOSIS — T8131XD Disruption of external operation (surgical) wound, not elsewhere classified, subsequent encounter: Secondary | ICD-10-CM

## 2018-07-26 NOTE — Progress Notes (Signed)
Subjective:  Chief complaint: He wants to make sure he is seen by orthopedics sooner than his appointment on 11 March  Patient ID: Terry [name], male    DOB: 05-24-1951, 68 y.o.   MRN: [id number]  HPI  68 y.o. male with s/p Achilles tendon reconstruction on 06/07/18 presenting with post-surgical wound infection s/p irrigation and debridement on 06/09/18 stain showing gram-positive cocci and a gram-negative rod cultures grew Staph epidermidis.   He was on vancomycin cefepime as an inpatient.  We transitioned him to Zyvox and metronidazole and sent him out with a 2-week course.  He is to complete antibiotics tonight.  Wound looks to be healing well.  He wants to be seen by orthopedic surgery sooner as his next appointment is 11 March but I reach out to his orthopedic surgeon Dr. Doran Durand to see if he can get seen sooner than 11 March.    Past Medical History:  Diagnosis Date  . Achilles tendon infection 07/26/2018  . Allergy    takes Zyrtec as needed  . Arthritis   . Asthma    pt denies  . Chronic back pain   . GERD (gastroesophageal reflux disease)    takes Omeprazole daily  . Hay fever   . History of colon polyps    benign  . Hypercholesterolemia    takes Crestor daily  . Infection, skin 07/26/2018  . Pneumonia    "walking" pneumonia - as a child  . Prostate cancer ([place]) 04/13/13   gleason 6, vol 55.4 cc    Past Surgical History:  Procedure Laterality Date  . ANTERIOR CERVICAL DECOMP/DISCECTOMY FUSION N/A 06/27/2014   Procedure: ANTERIOR CERVICAL DECOMPRESSION/DISCECTOMY FUSION C5-C7   (2 LEVELS);  Surgeon: Melina Schools, MD;  Location: [facility];  Service: Orthopedics;  Laterality: N/A;  . CARDIAC CATHETERIZATION  2004  . COLONOSCOPY    . EYE SURGERY     lazy eye as child, lasik surgery  . FINGER SURGERY Right    4th finger  . I&D EXTREMITY Right 07/10/2018   Procedure: IRRIGATION AND DEBRIDEMENT RIGHT ANKLE WOUND AND WOUND VAC PLACEMENT;  Surgeon: Wylene Simmer, MD;  Location:  [facility];  Service: Orthopedics;  Laterality: Right;  . KNEE ARTHROSCOPY Bilateral   . PROSTATE BIOPSY  04/13/13   gleason 3+3=6, vol 55.4 cc  . ROBOT ASSISTED LAPAROSCOPIC RADICAL PROSTATECTOMY N/A 05/22/2013   Procedure: ROBOTIC ASSISTED LAPAROSCOPIC RADICAL PROSTATECTOMY LEVEL 1;  Surgeon: Dutch Gray, MD;  Location: [facility];  Service: Urology;  Laterality: N/A;  . TONSILLECTOMY    . VASECTOMY      Family History  Problem Relation Age of Onset  . Pneumonia Mother        bacterial  . Dementia Mother   . Heart disease Father   . Heart attack Father   . Colon cancer Neg Hx       Social History   Socioeconomic History  . Marital status: Married    Spouse name: Not on file  . Number of children: Not on file  . Years of education: Not on file  . Highest education level: Not on file  Occupational History  . Not on file  Social Needs  . Financial resource strain: Not on file  . Food insecurity:    Worry: Not on file    Inability: Not on file  . Transportation needs:    Medical: Not on file    Non-medical: Not on file  Tobacco Use  . Smoking status: Never Smoker  .  Smokeless tobacco: Never Used  Substance and Sexual Activity  . Alcohol use: Yes    Alcohol/week: 0.0 standard drinks    Comment: couple of drinks a week  . Drug use: No  . Sexual activity: Not on file  Lifestyle  . Physical activity:    Days per week: Not on file    Minutes per session: Not on file  . Stress: Not on file  Relationships  . Social connections:    Talks on phone: Not on file    Gets together: Not on file    Attends religious service: Not on file    Active member of club or organization: Not on file    Attends meetings of clubs or organizations: Not on file    Relationship status: Not on file  Other Topics Concern  . Not on file  Social History Narrative  . Not on file    Allergies  Allergen Reactions  . Penicillins Hives, Itching and Other (See Comments)    Has patient had a PCN  reaction causing immediate rash, facial/tongue/throat swelling, SOB or lightheadedness with hypotension: No Has patient had a PCN reaction causing SEVERE RASH INVOLVING MUCUS MEMBRANES or SKIN NECROSIS: #  #  #  YES  #  #  #  Has patient had a PCN reaction that required hospitalization No Has patient had a PCN reaction occurring within the last 10 years: No      Current Outpatient Medications:  .  aspirin EC 81 MG tablet, Take 1 tablet (81 mg total) by mouth 2 (two) times daily., Disp: 84 tablet, Rfl: 0 .  cetirizine (ZYRTEC) 10 MG tablet, Take 10 mg by mouth daily as needed for allergies., Disp: , Rfl:  .  cycloSPORINE (RESTASIS) 0.05 % ophthalmic emulsion, Place 1 drop into both eyes every other day. , Disp: , Rfl:  .  dexamethasone (DECADRON) 1 MG tablet, 2 tablets twice daily for 2 days, one tablet twice daily for 2 days, one tablet daily for 2 days. (Patient not taking: Reported on 07/10/2018), Disp: 15 tablet, Rfl: 0 .  diphenhydrAMINE (BENADRYL) 25 MG tablet, Take 25 mg by mouth at bedtime as needed for sleep. , Disp: , Rfl:  .  docusate sodium (COLACE) 100 MG capsule, Take 1 capsule (100 mg total) by mouth 2 (two) times daily. While taking narcotic pain medicine., Disp: 30 capsule, Rfl: 0 .  linezolid (ZYVOX) 600 MG tablet, Take 1 tablet (600 mg total) by mouth 2 (two) times daily for 14 days., Disp: 28 tablet, Rfl: 0 .  methocarbamol (ROBAXIN) 500 MG tablet, Take 1 tablet (500 mg total) by mouth every 6 (six) hours as needed for muscle spasms. (Patient not taking: Reported on 07/10/2018), Disp: 40 tablet, Rfl: 3 .  metroNIDAZOLE (FLAGYL) 500 MG tablet, Take 1 tablet (500 mg total) by mouth 3 (three) times daily for 14 days., Disp: 42 tablet, Rfl: 0 .  Multiple Vitamin (MULTIVITAMIN) tablet, Take 1 tablet by mouth daily., Disp: , Rfl:  .  naproxen sodium (ALEVE) 220 MG tablet, Take 220 mg by mouth daily as needed (pain)., Disp: , Rfl:  .  omeprazole (PRILOSEC OTC) 20 MG tablet, Take 20 mg by  mouth daily., Disp: , Rfl:  .  senna (SENOKOT) 8.6 MG TABS tablet, Take 2 tablets (17.2 mg total) by mouth 2 (two) times daily., Disp: 30 each, Rfl: 0  Review of Systems  Constitutional: Negative for chills and fever.  HENT: Negative for congestion and sore throat.  Eyes: Negative for photophobia.  Respiratory: Negative for cough, shortness of breath and wheezing.   Cardiovascular: Negative for chest pain, palpitations and leg swelling.  Gastrointestinal: Negative for abdominal pain, blood in stool, constipation, diarrhea, nausea and vomiting.  Genitourinary: Negative for dysuria, flank pain and hematuria.  Musculoskeletal: Negative for back pain and myalgias.  Skin: Negative for rash.  Neurological: Negative for dizziness, weakness and headaches.  Hematological: Does not bruise/bleed easily.  Psychiatric/Behavioral: Negative for suicidal ideas.       Objective:   Physical Exam Constitutional:      General: He is not in acute distress.    Appearance: He is not ill-appearing or diaphoretic.  HENT:     Head: Normocephalic and atraumatic.     Right Ear: External ear normal.     Left Ear: External ear normal.     Nose: Nose normal.     Mouth/Throat:     Pharynx: No oropharyngeal exudate.  Eyes:     General: No scleral icterus.    Extraocular Movements: Extraocular movements intact.     Conjunctiva/sclera: Conjunctivae normal.  Neck:     Musculoskeletal: Normal range of motion and neck supple.  Cardiovascular:     Rate and Rhythm: Normal rate and regular rhythm.  Pulmonary:     Effort: Pulmonary effort is normal. No respiratory distress.  Abdominal:     General: Bowel sounds are normal.     Palpations: Abdomen is soft.  Musculoskeletal: Normal range of motion.        General: No tenderness.  Lymphadenopathy:     Cervical: No cervical adenopathy.  Skin:    General: Skin is warm and dry.     Coloration: Skin is not pale.     Findings: No erythema or rash.  Neurological:       General: No focal deficit present.     Mental Status: He is alert and oriented to person, place, and time.     Coordination: Coordination normal.  Psychiatric:        Mood and Affect: Mood normal.        Behavior: Behavior normal.        Thought Content: Thought content normal.        Judgment: Judgment normal.    07/26/2018:  Wound healing well:           Assessment & Plan:   Host operative Achilles tendon infection with Staphylococcus epidermidis.  Gram-negative rod was seen on Gram stain but no organism never grew aerobic or anaerobically.  He was treated with vancomycin and cefepime in house then changed to Zyvox and metronidazole and is completing 2 weeks of therapy tonight.  I have texted his orthopedic surgeon try to get him into clinic soon of the 11th I think is doing quite well and has no need for infectious needs follow-up at present.

## 2018-08-05 ENCOUNTER — Telehealth: Payer: Self-pay

## 2018-08-05 NOTE — Telephone Encounter (Signed)
He can have an empiric course of fluconazole 100 mg daily for 14 days

## 2018-08-05 NOTE — Telephone Encounter (Signed)
Patient calls states he feels like he is developing oral thrush. States his tongue is coated (white) and feels like "growing hair on his tongue" Routing to Dr. Tommy Medal Eugenia Mcalpine, LPN

## 2018-08-10 ENCOUNTER — Other Ambulatory Visit: Payer: Self-pay

## 2018-08-10 DIAGNOSIS — R740 Nonspecific elevation of levels of transaminase and lactic acid dehydrogenase [LDH]: Secondary | ICD-10-CM | POA: Diagnosis not present

## 2018-08-10 MED ORDER — FLUCONAZOLE 100 MG PO TABS: 100.0000 mg | ORAL_TABLET | Freq: Every day | ORAL | 0 refills | Status: DC

## 2018-08-10 NOTE — Progress Notes (Signed)
Per Dr. Tommy Medal ok to give fluconazole 100mg  daily for 14 days for oral thrush. Patient called and made aware of new medication sent electronically to preferred pharmacy.  Eugenia Mcalpine, LPN

## 2018-11-14 DIAGNOSIS — M5416 Radiculopathy, lumbar region: Secondary | ICD-10-CM | POA: Diagnosis not present

## 2018-11-14 DIAGNOSIS — M5116 Intervertebral disc disorders with radiculopathy, lumbar region: Secondary | ICD-10-CM | POA: Diagnosis not present

## 2018-11-28 DIAGNOSIS — M25471 Effusion, right ankle: Secondary | ICD-10-CM | POA: Diagnosis not present

## 2018-12-07 DIAGNOSIS — R262 Difficulty in walking, not elsewhere classified: Secondary | ICD-10-CM | POA: Diagnosis not present

## 2018-12-07 DIAGNOSIS — M6281 Muscle weakness (generalized): Secondary | ICD-10-CM | POA: Diagnosis not present

## 2018-12-07 DIAGNOSIS — M25671 Stiffness of right ankle, not elsewhere classified: Secondary | ICD-10-CM | POA: Diagnosis not present

## 2018-12-07 DIAGNOSIS — M25571 Pain in right ankle and joints of right foot: Secondary | ICD-10-CM | POA: Diagnosis not present

## 2018-12-07 DIAGNOSIS — M25471 Effusion, right ankle: Secondary | ICD-10-CM | POA: Diagnosis not present

## 2018-12-08 DIAGNOSIS — M25571 Pain in right ankle and joints of right foot: Secondary | ICD-10-CM | POA: Diagnosis not present

## 2018-12-08 DIAGNOSIS — M6281 Muscle weakness (generalized): Secondary | ICD-10-CM | POA: Diagnosis not present

## 2018-12-08 DIAGNOSIS — M25671 Stiffness of right ankle, not elsewhere classified: Secondary | ICD-10-CM | POA: Diagnosis not present

## 2018-12-08 DIAGNOSIS — R262 Difficulty in walking, not elsewhere classified: Secondary | ICD-10-CM | POA: Diagnosis not present

## 2018-12-08 DIAGNOSIS — M25471 Effusion, right ankle: Secondary | ICD-10-CM | POA: Diagnosis not present

## 2018-12-10 DIAGNOSIS — L03115 Cellulitis of right lower limb: Secondary | ICD-10-CM | POA: Diagnosis not present

## 2018-12-12 DIAGNOSIS — T8130XA Disruption of wound, unspecified, initial encounter: Secondary | ICD-10-CM | POA: Diagnosis not present

## 2018-12-12 DIAGNOSIS — M25471 Effusion, right ankle: Secondary | ICD-10-CM | POA: Diagnosis not present

## 2018-12-19 DIAGNOSIS — T8132XA Disruption of internal operation (surgical) wound, not elsewhere classified, initial encounter: Secondary | ICD-10-CM | POA: Diagnosis not present

## 2018-12-30 DIAGNOSIS — T8132XA Disruption of internal operation (surgical) wound, not elsewhere classified, initial encounter: Secondary | ICD-10-CM | POA: Diagnosis not present

## 2019-01-16 DIAGNOSIS — T8132XA Disruption of internal operation (surgical) wound, not elsewhere classified, initial encounter: Secondary | ICD-10-CM | POA: Diagnosis not present

## 2019-01-16 DIAGNOSIS — S86091A Other specified injury of right Achilles tendon, initial encounter: Secondary | ICD-10-CM | POA: Diagnosis not present

## 2019-01-18 ENCOUNTER — Ambulatory Visit: Payer: Medicare Other | Attending: Orthopedic Surgery | Admitting: Physical Therapy

## 2019-01-18 ENCOUNTER — Encounter: Payer: Self-pay | Admitting: Physical Therapy

## 2019-01-18 ENCOUNTER — Other Ambulatory Visit: Payer: Self-pay

## 2019-01-18 DIAGNOSIS — M25671 Stiffness of right ankle, not elsewhere classified: Secondary | ICD-10-CM

## 2019-01-18 DIAGNOSIS — R262 Difficulty in walking, not elsewhere classified: Secondary | ICD-10-CM

## 2019-01-18 DIAGNOSIS — M25571 Pain in right ankle and joints of right foot: Secondary | ICD-10-CM | POA: Diagnosis not present

## 2019-01-18 DIAGNOSIS — M6281 Muscle weakness (generalized): Secondary | ICD-10-CM

## 2019-01-18 NOTE — Therapy (Signed)
[facility] [address]  [place], [place], [address] Phone: [phone]   Fax:  [phone]  Physical Therapy Evaluation  Patient Details  Name: Terry [name] MRN: [id number] Date of Birth: Aug 09, 1950 Referring Provider (PT): Mechele Claude, [place]   Encounter Date: 01/18/2019  PT End of Session - 01/18/19 0954    Visit Number  1    Number of Visits  13    Date for PT Re-Evaluation  03/01/19    Authorization Type  Medicare & BCBS    PT Start Time  0806    PT Stop Time  0845    PT Time Calculation (min)  39 min    Activity Tolerance  Patient tolerated treatment well    Behavior During Therapy  [facility] for tasks assessed/performed       Past Medical History:  Diagnosis Date  . Achilles tendon infection 07/26/2018  . Allergy    takes Zyrtec as needed  . Arthritis   . Asthma    pt denies  . Chronic back pain   . GERD (gastroesophageal reflux disease)    takes Omeprazole daily  . Hay fever   . History of colon polyps    benign  . Hypercholesterolemia    takes Crestor daily  . Infection, skin 07/26/2018  . Pneumonia    "walking" pneumonia - as a child  . Prostate cancer ([place]) 04/13/13   gleason 6, vol 55.4 cc    Past Surgical History:  Procedure Laterality Date  . ANTERIOR CERVICAL DECOMP/DISCECTOMY FUSION N/A 06/27/2014   Procedure: ANTERIOR CERVICAL DECOMPRESSION/DISCECTOMY FUSION C5-C7   (2 LEVELS);  Surgeon: Melina Schools, MD;  Location: [facility];  Service: Orthopedics;  Laterality: N/A;  . CARDIAC CATHETERIZATION  2004  . COLONOSCOPY    . EYE SURGERY     lazy eye as child, lasik surgery  . FINGER SURGERY Right    4th finger  . I&D EXTREMITY Right 07/10/2018   Procedure: IRRIGATION AND DEBRIDEMENT RIGHT ANKLE WOUND AND WOUND VAC PLACEMENT;  Surgeon: Wylene Simmer, MD;  Location: [facility];  Service: Orthopedics;  Laterality: Right;  . KNEE ARTHROSCOPY Bilateral   . PROSTATE BIOPSY  04/13/13   gleason 3+3=6, vol  55.4 cc  . ROBOT ASSISTED LAPAROSCOPIC RADICAL PROSTATECTOMY N/A 05/22/2013   Procedure: ROBOTIC ASSISTED LAPAROSCOPIC RADICAL PROSTATECTOMY LEVEL 1;  Surgeon: Dutch Gray, MD;  Location[facility];  Service: Urology;  Laterality: N/A;  . TONSILLECTOMY    . VASECTOMY      There were no vitals filed for this visit.   Subjective Assessment - 01/18/19 0810    Subjective  Patient reports undergoing R Achilles tendon reconstruction on 06/07/18. Sustained a subsequent staph infection in the wound and went into emergency surgery on 07/10/18. Still having soreness and swelling in the R Achilles and foot. Also reports intermittent numbness over the dorsal foot with certain motions. Pain is worse with gastroc stretching, walking, sitting. Better with ice and heat and meds.    Pertinent History  prostate CA, HLD, hay fever, GERD, chronic back pain, asthma, R achilles tendon repair, B knee arthroscopy,R finger surgery, cardiac cath 2004, ACDF 2016    Limitations  Sitting;Standing;Walking;House hold activities    How long can you sit comfortably?  couple minutes    How long can you stand comfortably?  10-20 min    How long can you walk comfortably?  2/3 mile    Diagnostic tests  none  Patient Stated Goals  i want to be able to walk 3 miles    Currently in Pain?  Yes    Pain Score  2     Pain Location  Heel    Pain Orientation  Right    Pain Descriptors / Indicators  Dull    Pain Type  Chronic pain         [facility] PT Assessment - 01/18/19 0816      Assessment   Medical Diagnosis  Rupture of R achilles tendon    Referring Provider (PT)  Mechele Claude, PA-C    Onset Date/Surgical Date  06/07/18    Next MD Visit  not scheduled    Prior Therapy  yes      Precautions   Precautions  None      Balance Screen   Has the patient fallen in the past 6 months  No    Has the patient had a decrease in activity level because of a fear of falling?   No    Is the patient reluctant to leave their home because  of a fear of falling?   No      Home Environment   Living Environment  Private residence    Living Arrangements  Spouse/significant other    Available Help at Discharge  Family    Type of [place] Access  Level entry    [place]  Crutches;Walker - 2 wheels      Prior Function   Level of [place]  Retired    Leisure  golf      Cognition   Overall Cognitive Status  Within Functional Limits for tasks assessed      Observation/Other Assessments   Observations  R achilles incision well-healed      Sensation   Light Touch  Appears Intact      Coordination   Gross Motor Movements are Fluid and Coordinated  Yes      Posture/Postural Control   Posture/Postural Control  Postural limitations    Postural Limitations  Rounded Shoulders;Forward head      ROM / Strength   AROM / PROM / Strength  AROM;Strength      AROM   AROM Assessment Site  Ankle    Right/Left Ankle  Right;Left    Right Ankle Dorsiflexion  10   moderate pain over top of foot   Right Ankle Plantar Flexion  38   "stretching"   Right Ankle Inversion  24   numbness in top of foot   Right Ankle Eversion  20   numbness in top of foot   Left Ankle Dorsiflexion  18    Left Ankle Plantar Flexion  41    Left Ankle Inversion  18    Left Ankle Eversion  18      Strength   Strength Assessment Site  Hip;Knee;Ankle    Right/Left Hip  Right;Left    Right Hip Flexion  4/5    Right Hip ABduction  4+/5    Right Hip ADduction  4+/5    Left Hip Flexion  4+/5    Left Hip ABduction  4+/5    Left Hip ADduction  4+/5    Right/Left Knee  Right;Left    Right Knee Flexion  4+/5    Right Knee Extension  4/5    Left Knee Flexion  4+/5  Left Knee Extension  4/5    Right/Left Ankle  Right;Left    Right Ankle Dorsiflexion  4/5   mild pain in heel   Right Ankle Plantar Flexion  2+/5   mild pain   Right Ankle Inversion  3+/5    Right Ankle Eversion  3+/5     Left Ankle Dorsiflexion  4+/5    Left Ankle Plantar Flexion  2+/5    Left Ankle Inversion  4/5    Left Ankle Eversion  4+/5      Palpation   Palpation comment  TTP in R medial and lateral gastroc and with soft tissue restriction throughout, tender along B sides of achilles tendon, and over 1st MTP      Ambulation/Gait   Gait Pattern  Step-through pattern;Decreased weight shift to right;Decreased dorsiflexion - left;Decreased stance time - right;Decreased step length - left;Lateral trunk lean to right;Lateral trunk lean to left;Poor foot clearance - left;Poor foot clearance - right    Ambulation Surface  Level;Indoor    Gait velocity  WFL                Objective measurements completed on examination: See above findings.              PT Education - 01/18/19 0954    Education Details  prognosis, POC, HEP    Person(s) Educated  Patient    Methods  Explanation;Demonstration;Tactile cues;Verbal cues;Handout    Comprehension  Verbalized understanding;Returned demonstration       PT Short Term Goals - 01/18/19 1001      PT SHORT TERM GOAL #1   Title  Patient to be independent with initial HEP.    Time  3    Period  Weeks    Status  New    Target Date  02/08/19        PT Long Term Goals - 01/18/19 1001      PT LONG TERM GOAL #1   Title  Patient to be independent with advanced HEP.    Time  6    Period  Weeks    Status  New    Target Date  03/01/19      PT LONG TERM GOAL #2   Title  Patient to demonstrate R ankle AROM WFL and without pain limiting.    Time  6    Period  Weeks    Status  New    Target Date  03/01/19      PT LONG TERM GOAL #3   Title  Patient to demonstrate B LE strength >=4+/5.    Time  6    Period  Weeks    Status  New    Target Date  03/01/19      PT LONG TERM GOAL #4   Title  Patient to demonstrate R SLS for 30 sec without LOB.    Time  6    Period  Weeks    Status  New    Target Date  03/01/19      PT LONG TERM GOAL  #5   Title  Patient to report return to golf with modifications as needed and no pain.    Time  6    Period  Weeks    Status  New    Target Date  03/01/19             Plan - 01/18/19 0954    Clinical Impression Statement  Patient is a 68y/o M presenting to  [facility] with c/o R Achilles tendon pain s/p R Achilles tendon reconstruction on 06/07/18 and subsequent wound debridement and closure on 07/10/18. Patient reports intermittent numbness over R dorsum of foot. Pain is worse with gastroc stretching, walking, sitting. Would like to return to golf and tennis. Patient today with slightly limited and painful R ankle AROM, decreased B ankle strength, gait deviations, and tenderness and soft tissue restriction along R posterior lower leg and foot. Educated patient on gentle stretching and strengthening HEP- patient reported understanding., Would benefit from skilled PT services 2x/week for 6 weeks to address aforementioned impairments.    Personal Factors and Comorbidities  Age;Comorbidity 3+;Time since onset of injury/illness/exacerbation;Fitness;Past/Current Experience    Comorbidities  prostate CA, HLD, hay fever, GERD, chronic back pain, asthma, R achilles tendon repair, B knee arthroscopy,R finger surgery, cardiac cath 2004, ACDF 2016    Examination-Activity Limitations  Sit;Bend;Squat;Caring for Others;Stairs;Carry;Stand;Transfers;Lift;Locomotion Level    Examination-Participation Restrictions  Church;Cleaning;Shop;Community Activity;Volunteer;Driving;Yard Work;Interpersonal Relationship;Laundry;Meal Prep    Stability/Clinical Decision Making  Stable/Uncomplicated    Clinical Decision Making  Low    Rehab Potential  Good    PT Frequency  2x / week    PT Duration  6 weeks    PT Treatment/Interventions  ADLs/Self Care Home Management;Cryotherapy;Electrical Stimulation;Iontophoresis 4mg /ml Dexamethasone;Moist Heat;Balance training;Therapeutic exercise;Therapeutic activities;Functional mobility  training;Stair training;Gait training;Ultrasound;Neuromuscular re-education;Patient/family education;Manual techniques;Vasopneumatic Device;Taping;Energy conservation;Dry needling;Passive range of motion;Scar mobilization    PT Next Visit Plan  reassess HEP; FOTO    Consulted and Agree with Plan of Care  Patient       Patient will benefit from skilled therapeutic intervention in order to improve the following deficits and impairments:  Increased edema, Decreased scar mobility, Decreased activity tolerance, Decreased strength, Pain, Increased fascial restricitons, Increased muscle spasms, Difficulty walking, Decreased balance, Decreased range of motion, Improper body mechanics, Postural dysfunction, Impaired flexibility  Visit Diagnosis: 1. Pain in right ankle and joints of right foot   2. Stiffness of right ankle, not elsewhere classified   3. Muscle weakness (generalized)   4. Difficulty in walking, not elsewhere classified        Problem List Patient Active Problem List   Diagnosis Date Noted  . Infection, skin 07/26/2018  . Achilles tendon infection 07/26/2018  . Wound dehiscence, surgical 07/10/2018    Class: Acute  . Wound dehiscence, surgical, initial encounter 07/10/2018  . Spondylolisthesis of lumbar region 08/31/2016  . Neck pain 06/27/2014  . Prostate cancer [facility])      Janene Harvey, PT, DPT 01/18/19 10:04 AM   [facility] [address]  [place], [place], [address] Phone: [phone]   Fax:  [phone]  Name: Terry [name] MRN: [id number] Date of Birth: July 29, 1950

## 2019-01-19 ENCOUNTER — Ambulatory Visit: Payer: Medicare Other

## 2019-01-19 ENCOUNTER — Other Ambulatory Visit: Payer: Self-pay

## 2019-01-19 DIAGNOSIS — R262 Difficulty in walking, not elsewhere classified: Secondary | ICD-10-CM

## 2019-01-19 DIAGNOSIS — M25671 Stiffness of right ankle, not elsewhere classified: Secondary | ICD-10-CM | POA: Diagnosis not present

## 2019-01-19 DIAGNOSIS — M25571 Pain in right ankle and joints of right foot: Secondary | ICD-10-CM | POA: Diagnosis not present

## 2019-01-19 DIAGNOSIS — M6281 Muscle weakness (generalized): Secondary | ICD-10-CM | POA: Diagnosis not present

## 2019-01-19 NOTE — Therapy (Signed)
[facility] [address]  [place], [place], [address] Phone: [phone]   Fax:  [phone]  Physical Therapy Treatment  Patient Details  Name: Terry [name] MRN: [id number] Date of Birth: 03/12/1951 Referring Provider (PT): Mechele Claude, [place]   Encounter Date: 01/19/2019  PT End of Session - 01/19/19 3295    Visit Number  2    Number of Visits  13    Date for PT Re-Evaluation  03/01/19    Authorization Type  Medicare & BCBS    PT Start Time  0803    PT Stop Time  0903    PT Time Calculation (min)  60 min    Activity Tolerance  Patient tolerated treatment well    Behavior During Therapy  [facility] for tasks assessed/performed       Past Medical History:  Diagnosis Date  . Achilles tendon infection 07/26/2018  . Allergy    takes Zyrtec as needed  . Arthritis   . Asthma    pt denies  . Chronic back pain   . GERD (gastroesophageal reflux disease)    takes Omeprazole daily  . Hay fever   . History of colon polyps    benign  . Hypercholesterolemia    takes Crestor daily  . Infection, skin 07/26/2018  . Pneumonia    "walking" pneumonia - as a child  . Prostate cancer ([place]) 04/13/13   gleason 6, vol 55.4 cc    Past Surgical History:  Procedure Laterality Date  . ANTERIOR CERVICAL DECOMP/DISCECTOMY FUSION N/A 06/27/2014   Procedure: ANTERIOR CERVICAL DECOMPRESSION/DISCECTOMY FUSION C5-C7   (2 LEVELS);  Surgeon: Melina Schools, MD;  Location: [facility];  Service: Orthopedics;  Laterality: N/A;  . CARDIAC CATHETERIZATION  2004  . COLONOSCOPY    . EYE SURGERY     lazy eye as child, lasik surgery  . FINGER SURGERY Right    4th finger  . I&D EXTREMITY Right 07/10/2018   Procedure: IRRIGATION AND DEBRIDEMENT RIGHT ANKLE WOUND AND WOUND VAC PLACEMENT;  Surgeon: Wylene Simmer, MD;  Location: [facility];  Service: Orthopedics;  Laterality: Right;  . KNEE ARTHROSCOPY Bilateral   . PROSTATE BIOPSY  04/13/13   gleason 3+3=6, vol  55.4 cc  . ROBOT ASSISTED LAPAROSCOPIC RADICAL PROSTATECTOMY N/A 05/22/2013   Procedure: ROBOTIC ASSISTED LAPAROSCOPIC RADICAL PROSTATECTOMY LEVEL 1;  Surgeon: Dutch Gray, MD;  [facility];  Service: Urology;  Laterality: N/A;  . TONSILLECTOMY    . VASECTOMY      There were no vitals filed for this visit.  Subjective Assessment - 01/19/19 0807    Subjective  Denies issues with HEP.    Pertinent History  prostate CA, HLD, hay fever, GERD, chronic back pain, asthma, R achilles tendon repair, B knee arthroscopy,R finger surgery, cardiac cath 2004, ACDF 2016    Diagnostic tests  none    Patient Stated Goals  i want to be able to walk 3 miles    Currently in Pain?  No/denies    Pain Score  0-No pain    Pain Location  Heel    Pain Orientation  Right    Pain Descriptors / Indicators  Dull    Pain Type  Chronic pain    Multiple Pain Sites  No         [facility] PT Assessment - 01/19/19 0001      Observation/Other Assessments   Focus on Therapeutic Outcomes (FOTO)   47% (53% limitation)  [facility] PT Treatment/Exercise - 01/19/19 0001      Self-Care   Self-Care  Other Self-Care Comments    Other Self-Care Comments   instreuction in gentle cross-friction massage       Modalities   Modalities  Vasopneumatic      Vasopneumatic   Number Minutes Vasopneumatic   10 minutes    Vasopnuematic Location   Ankle   R   Vasopneumatic Pressure  Low    Vasopneumatic Temperature   coldest temp.      Manual Therapy   Manual Therapy  Soft tissue mobilization;Passive ROM    Soft tissue mobilization  cross-friction massage light pinch grip to either side of the incision     Passive ROM  Gentle manual R gastroc, soleus stretch with therapist x 30 sec      Ankle Exercises: Supine   T-Band  4-dir red TB with cues for isolated ankle motion x 10 reps - therapist anchored      Ankle Exercises: Stretches   Soleus Stretch  2 reps;30 seconds    Soleus Stretch  Limitations  into counter    Gastroc Stretch  2 reps;30 seconds   cues for gentle streth to avoid pain   Gastroc Stretch Limitations  into  counter      Ankle Exercises: Aerobic   Recumbent Bike  lvl 2, 7 min               PT Short Term Goals - 01/19/19 8413      PT SHORT TERM GOAL #1   Title  Patient to be independent with initial HEP.    Time  3    Period  Weeks    Status  On-going    Target Date  02/08/19        PT Long Term Goals - 01/19/19 0808      PT LONG TERM GOAL #1   Title  Patient to be independent with advanced HEP.    Time  6    Period  Weeks    Status  On-going      PT LONG TERM GOAL #2   Title  Patient to demonstrate R ankle AROM WFL and without pain limiting.    Time  6    Period  Weeks    Status  On-going      PT LONG TERM GOAL #3   Title  Patient to demonstrate B LE strength >=4+/5.    Time  6    Period  Weeks    Status  On-going      PT LONG TERM GOAL #4   Title  Patient to demonstrate R SLS for 30 sec without LOB.    Time  6    Period  Weeks    Status  On-going      PT LONG TERM GOAL #5   Title  Patient to report return to golf with modifications as needed and no pain.    Time  6    Period  Weeks    Status  On-going            Plan - 01/19/19 0815    Clinical Impression Statement  Pt. doing well today and reports he feel like he has good understanding of HEP.  Did require minor cueing x 2 in therex today to avoid painful arc of motion at R ankle at end range IV, PF cause some short-lasting pain during strengthening activities.  Performed gentle cross-friction massage at R distal  Achilles tendon which was tolerated well today and instruct pt. in this activity however did avoid superior incision as this area somewhat irritated and pt. covering this with bandage.  Ended visit with ice/compression to R heel due to some visible swelling visible around incision.  Will continue to progress toward goals.    Personal Factors and  Comorbidities  Age;Comorbidity 3+;Time since onset of injury/illness/exacerbation;Fitness;Past/Current Experience    Comorbidities  prostate CA, HLD, hay fever, GERD, chronic back pain, asthma, R achilles tendon repair, B knee arthroscopy,R finger surgery, cardiac cath 2004, ACDF 2016    Rehab Potential  Good    PT Treatment/Interventions  ADLs/Self Care Home Management;Cryotherapy;Electrical Stimulation;Iontophoresis 4mg /ml Dexamethasone;Moist Heat;Balance training;Therapeutic exercise;Therapeutic activities;Functional mobility training;Stair training;Gait training;Ultrasound;Neuromuscular re-education;Patient/family education;Manual techniques;Vasopneumatic Device;Taping;Energy conservation;Dry needling;Passive range of motion;Scar mobilization    PT Next Visit Plan  gentle akle strengthening, stretching, modalities prn    Consulted and Agree with Plan of Care  Patient       Patient will benefit from skilled therapeutic intervention in order to improve the following deficits and impairments:  Increased edema, Decreased scar mobility, Decreased activity tolerance, Decreased strength, Pain, Increased fascial restricitons, Increased muscle spasms, Difficulty walking, Decreased balance, Decreased range of motion, Improper body mechanics, Postural dysfunction, Impaired flexibility  Visit Diagnosis: 1. Pain in right ankle and joints of right foot   2. Stiffness of right ankle, not elsewhere classified   3. Muscle weakness (generalized)   4. Difficulty in walking, not elsewhere classified        Problem List Patient Active Problem List   Diagnosis Date Noted  . Infection, skin 07/26/2018  . Achilles tendon infection 07/26/2018  . Wound dehiscence, surgical 07/10/2018    Class: Acute  . Wound dehiscence, surgical, initial encounter 07/10/2018  . Spondylolisthesis of lumbar region 08/31/2016  . Neck pain 06/27/2014  . Prostate cancer [facility])     Bess Harvest, PTA 01/19/19 12:04 PM   [facility] [address]  [place], [place], [address] Phone: [phone]   Fax:  [phone]  Name: Terry [name] MRN: [id number] Date of Birth: 25-Apr-1951

## 2019-01-24 ENCOUNTER — Encounter: Payer: Self-pay | Admitting: Physical Therapy

## 2019-01-24 ENCOUNTER — Ambulatory Visit: Payer: Medicare Other | Admitting: Physical Therapy

## 2019-01-24 ENCOUNTER — Other Ambulatory Visit: Payer: Self-pay

## 2019-01-24 DIAGNOSIS — M25671 Stiffness of right ankle, not elsewhere classified: Secondary | ICD-10-CM

## 2019-01-24 DIAGNOSIS — M25571 Pain in right ankle and joints of right foot: Secondary | ICD-10-CM

## 2019-01-24 DIAGNOSIS — R262 Difficulty in walking, not elsewhere classified: Secondary | ICD-10-CM

## 2019-01-24 DIAGNOSIS — M6281 Muscle weakness (generalized): Secondary | ICD-10-CM

## 2019-01-24 NOTE — Therapy (Signed)
[facility] [address]  [place], [place], [address] Phone: [phone]   Fax:  [phone]  Physical Therapy Treatment  Patient Details  Name: Terry [name] MRN: [id number] Date of Birth: 1951/04/06 Referring Provider (PT): Mechele Claude, [place]   Encounter Date: 01/24/2019  PT End of Session - 01/24/19 [id number]    Visit Number  3    Number of Visits  13    Date for PT Re-Evaluation  03/01/19    Authorization Type  Medicare & BCBS    PT Start Time  0801    PT Stop Time  0858    PT Time Calculation (min)  57 min    Activity Tolerance  Patient tolerated treatment well    Behavior During Therapy  [facility] for tasks assessed/performed       Past Medical History:  Diagnosis Date  . Achilles tendon infection 07/26/2018  . Allergy    takes Zyrtec as needed  . Arthritis   . Asthma    pt denies  . Chronic back pain   . GERD (gastroesophageal reflux disease)    takes Omeprazole daily  . Hay fever   . History of colon polyps    benign  . Hypercholesterolemia    takes Crestor daily  . Infection, skin 07/26/2018  . Pneumonia    "walking" pneumonia - as a child  . Prostate cancer ([place]) 04/13/13   gleason 6, vol 55.4 cc    Past Surgical History:  Procedure Laterality Date  . ANTERIOR CERVICAL DECOMP/DISCECTOMY FUSION N/A 06/27/2014   Procedure: ANTERIOR CERVICAL DECOMPRESSION/DISCECTOMY FUSION C5-C7   (2 LEVELS);  Surgeon: Melina Schools, MD;  Location: [place];  Service: Orthopedics;  Laterality: N/A;  . CARDIAC CATHETERIZATION  2004  . COLONOSCOPY    . EYE SURGERY     lazy eye as child, lasik surgery  . FINGER SURGERY Right    4th finger  . I&D EXTREMITY Right 07/10/2018   Procedure: IRRIGATION AND DEBRIDEMENT RIGHT ANKLE WOUND AND WOUND VAC PLACEMENT;  Surgeon: Wylene Simmer, MD;  Location: [place];  Service: Orthopedics;  Laterality: Right;  . KNEE ARTHROSCOPY Bilateral   . PROSTATE BIOPSY  04/13/13   gleason 3+3=6, vol  55.4 cc  . ROBOT ASSISTED LAPAROSCOPIC RADICAL PROSTATECTOMY N/A 05/22/2013   Procedure: ROBOTIC ASSISTED LAPAROSCOPIC RADICAL PROSTATECTOMY LEVEL 1;  Surgeon: Dutch Gray, MD;  [facility];  Service: Urology;  Laterality: N/A;  . TONSILLECTOMY    . VASECTOMY      There were no vitals filed for this visit.  Subjective Assessment - 01/24/19 0802    Subjective  Has been using Mederma for scar massage and has been compliant with HEP.    Pertinent History  prostate CA, HLD, hay fever, GERD, chronic back pain, asthma, R achilles tendon repair, B knee arthroscopy,R finger surgery, cardiac cath 2004, ACDF 2016    Diagnostic tests  none    Patient Stated Goals  i want to be able to walk 3 miles    Currently in Pain?  No/denies                       [facility] Adult PT Treatment/Exercise - 01/24/19 0001      Exercises   Exercises  Ankle      Vasopneumatic   Number Minutes Vasopneumatic   15 minutes    Vasopnuematic Location   Ankle   R   Vasopneumatic Pressure  Low  Vasopneumatic Temperature   coldest temp.      Manual Therapy   Manual Therapy  Soft tissue mobilization;Passive ROM;Myofascial release    Manual therapy comments  prone    Soft tissue mobilization  scar massage to R achilles; STM to medial and lateral gastroc and posterior tib    Myofascial Release  manual TPR to medial gastroc and posterior tib    Passive ROM  gentle R ankle dorsiflexion stretch 2x20", with great toe stretch 3x20"      Ankle Exercises: Stretches   Gastroc Stretch  2 reps;30 seconds    Gastroc Stretch Limitations  toes up on counter   to tolerance     Ankle Exercises: Aerobic   Recumbent Bike  lvl 2, 6 min      Ankle Exercises: Machines for Strengthening   Cybex Leg Press  B LEs 2x10 20#      Ankle Exercises: Standing   Other Standing Ankle Exercises  assisted B heel raise at lat pull bat x10   with difficulty     Ankle Exercises: Seated   Other Seated Ankle Exercises  R toe  flexion/extension stretch 5x20"   to tolerance            PT Education - 01/24/19 0939    Education Details  update to HEP; edu on plantar surface ball massage and scar massage    Person(s) Educated  Patient    Methods  Explanation;Demonstration;Tactile cues;Verbal cues;Handout    Comprehension  Verbalized understanding;Returned demonstration       PT Short Term Goals - 01/24/19 0947      PT SHORT TERM GOAL #1   Title  Patient to be independent with initial HEP.    Time  3    Period  Weeks    Status  On-going    Target Date  02/08/19        PT Long Term Goals - 01/19/19 0808      PT LONG TERM GOAL #1   Title  Patient to be independent with advanced HEP.    Time  6    Period  Weeks    Status  On-going      PT LONG TERM GOAL #2   Title  Patient to demonstrate R ankle AROM WFL and without pain limiting.    Time  6    Period  Weeks    Status  On-going      PT LONG TERM GOAL #3   Title  Patient to demonstrate B LE strength >=4+/5.    Time  6    Period  Weeks    Status  On-going      PT LONG TERM GOAL #4   Title  Patient to demonstrate R SLS for 30 sec without LOB.    Time  6    Period  Weeks    Status  On-going      PT LONG TERM GOAL #5   Title  Patient to report return to golf with modifications as needed and no pain.    Time  6    Period  Weeks    Status  On-going            Plan - 01/24/19 0940    Clinical Impression Statement  Patient reporting no new complaints today. Tolerated manual therapy to improve soft tissue restriction and ROM. Patient demonstrated considerable palpable and tender trigger points throughout R medial gastroc and posterior tibialis. Tolerated gentle R dorsiflexion PROM, with most  restriction observed when integrating great toe extension. Introduced Press photographer with great toe extension which patient tolerated well, thus added this into HEP. Patient with difficulty performing B heel raise against body weight, thus  performed this exercise on leg press and with assistance of UEs using lat pull bar. Ended session with [employer] to R foot- patient without complaints at end of session.    Comorbidities  prostate CA, HLD, hay fever, GERD, chronic back pain, asthma, R achilles tendon repair, B knee arthroscopy,R finger surgery, cardiac cath 2004, ACDF 2016    Rehab Potential  Good    PT Treatment/Interventions  ADLs/Self Care Home Management;Cryotherapy;Electrical Stimulation;Iontophoresis 4mg /ml Dexamethasone;Moist Heat;Balance training;Therapeutic exercise;Therapeutic activities;Functional mobility training;Stair training;Gait training;Ultrasound;Neuromuscular re-education;Patient/family education;Manual techniques;Vasopneumatic Device;Taping;Energy conservation;Dry needling;Passive range of motion;Scar mobilization    PT Next Visit Plan  gentle ankle strengthening, stretching, modalities prn    Consulted and Agree with Plan of Care  Patient       Patient will benefit from skilled therapeutic intervention in order to improve the following deficits and impairments:  Increased edema, Decreased scar mobility, Decreased activity tolerance, Decreased strength, Pain, Increased fascial restricitons, Increased muscle spasms, Difficulty walking, Decreased balance, Decreased range of motion, Improper body mechanics, Postural dysfunction, Impaired flexibility  Visit Diagnosis: Pain in right ankle and joints of right foot  Stiffness of right ankle, not elsewhere classified  Muscle weakness (generalized)  Difficulty in walking, not elsewhere classified     Problem List Patient Active Problem List   Diagnosis Date Noted  . Infection, skin 07/26/2018  . Achilles tendon infection 07/26/2018  . Wound dehiscence, surgical 07/10/2018    Class: Acute  . Wound dehiscence, surgical, initial encounter 07/10/2018  . Spondylolisthesis of lumbar region 08/31/2016  . Neck pain 06/27/2014  . Prostate cancer [facility])       Janene Harvey, PT, DPT 01/24/19 9:48 AM   [facility] [address]  [place], [place], [address] Phone: [phone]   Fax:  [phone]  Name: Terry [name] MRN: [id number] Date of Birth: 07/29/1950

## 2019-01-26 ENCOUNTER — Other Ambulatory Visit: Payer: Self-pay

## 2019-01-26 ENCOUNTER — Ambulatory Visit: Payer: Medicare Other

## 2019-01-26 DIAGNOSIS — M6281 Muscle weakness (generalized): Secondary | ICD-10-CM

## 2019-01-26 DIAGNOSIS — M25571 Pain in right ankle and joints of right foot: Secondary | ICD-10-CM | POA: Diagnosis not present

## 2019-01-26 DIAGNOSIS — R262 Difficulty in walking, not elsewhere classified: Secondary | ICD-10-CM | POA: Diagnosis not present

## 2019-01-26 DIAGNOSIS — M25671 Stiffness of right ankle, not elsewhere classified: Secondary | ICD-10-CM | POA: Diagnosis not present

## 2019-01-26 NOTE — Therapy (Signed)
[facility] [address]  [place], [place], [address] Phone: [phone]   Fax:  [phone]  Physical Therapy Treatment  Patient Details  Name: Terry [name] MRN: [id number] Date of Birth: 31-May-1951 Referring Provider (PT): Mechele Claude, [place]   Encounter Date: 01/26/2019  PT End of Session - 01/26/19 [id number]    Visit Number  4    Number of Visits  13    Date for PT Re-Evaluation  03/01/19    Authorization Type  Medicare & BCBS    PT Start Time  0804    PT Stop Time  0905    PT Time Calculation (min)  61 min    Activity Tolerance  Patient tolerated treatment well    Behavior During Therapy  [facility] for tasks assessed/performed       Past Medical History:  Diagnosis Date  . Achilles tendon infection 07/26/2018  . Allergy    takes Zyrtec as needed  . Arthritis   . Asthma    pt denies  . Chronic back pain   . GERD (gastroesophageal reflux disease)    takes Omeprazole daily  . Hay fever   . History of colon polyps    benign  . Hypercholesterolemia    takes Crestor daily  . Infection, skin 07/26/2018  . Pneumonia    "walking" pneumonia - as a child  . Prostate cancer ([place]) 04/13/13   gleason 6, vol 55.4 cc    Past Surgical History:  Procedure Laterality Date  . ANTERIOR CERVICAL DECOMP/DISCECTOMY FUSION N/A 06/27/2014   Procedure: ANTERIOR CERVICAL DECOMPRESSION/DISCECTOMY FUSION C5-C7   (2 LEVELS);  Surgeon: Melina Schools, MD;  Location: [place];  Service: Orthopedics;  Laterality: N/A;  . CARDIAC CATHETERIZATION  2004  . COLONOSCOPY    . EYE SURGERY     lazy eye as child, lasik surgery  . FINGER SURGERY Right    4th finger  . I&D EXTREMITY Right 07/10/2018   Procedure: IRRIGATION AND DEBRIDEMENT RIGHT ANKLE WOUND AND WOUND VAC PLACEMENT;  Surgeon: Wylene Simmer, MD;  Location: [place];  Service: Orthopedics;  Laterality: Right;  . KNEE ARTHROSCOPY Bilateral   . PROSTATE BIOPSY  04/13/13   gleason 3+3=6, vol  55.4 cc  . ROBOT ASSISTED LAPAROSCOPIC RADICAL PROSTATECTOMY N/A 05/22/2013   Procedure: ROBOTIC ASSISTED LAPAROSCOPIC RADICAL PROSTATECTOMY LEVEL 1;  Surgeon: Dutch Gray, MD;  [facility];  Service: Urology;  Laterality: N/A;  . TONSILLECTOMY    . VASECTOMY      There were no vitals filed for this visit.  Subjective Assessment - 01/26/19 0807    Subjective  Pt. reporting some soreness after last session.    Pertinent History  prostate CA, HLD, hay fever, GERD, chronic back pain, asthma, R achilles tendon repair, B knee arthroscopy,R finger surgery, cardiac cath 2004, ACDF 2016    Diagnostic tests  none    Patient Stated Goals  i want to be able to walk 3 miles    Currently in Pain?  Yes    Pain Score  1     Pain Location  Heel    Pain Orientation  Right    Pain Descriptors / Indicators  --   "pin prick"   Pain Type  Chronic pain         [facility] PT Assessment - 01/26/19 0001      AROM   Right/Left Ankle  Right    Right Ankle Dorsiflexion  14  [facility] Adult PT Treatment/Exercise - 01/26/19 0001      Knee/Hip Exercises: Standing   Forward Step Up  Right;15 reps;Hand Hold: 1    Forward Step Up Limitations  7" step in stair well    Step Down  Right;10 reps;1 set;Step Height: 4";Hand Hold: 2    Step Down Limitations  Difficulty maintaining eccentric control; at machine     Functional Squat  10 reps;3 seconds    Functional Squat Limitations  to depth of functional achilles tendon stretch       Vasopneumatic   Number Minutes Vasopneumatic   15 minutes    Vasopnuematic Location   Ankle    Vasopneumatic Pressure  Low    Vasopneumatic Temperature   coldest temp.      Ankle Exercises: Aerobic   Recumbent Bike  lvl 2, 7 min      Ankle Exercises: Stretches   Plantar Fascia Stretch  1 rep;30 seconds   prostretch   Soleus Stretch  2 reps;30 seconds    Soleus Stretch Limitations  into counter    Gastroc Stretch  2 reps;30 seconds    Gastroc  Stretch Limitations  toes up on counter    Other Stretch  calf stretch + plantar stretch on wall x 30 sec      Ankle Exercises: Machines for Strengthening   Cybex Leg Press  B LEs calf raisex 15 20#      Ankle Exercises: Supine   T-Band  4-dir red TB with cues for isolated ankle motion x 12 reps - therapist anchored      Ankle Exercises: Standing   Heel Raises  --   attempted at wall however unable due to weakness              PT Short Term Goals - 01/24/19 0947      PT SHORT TERM GOAL #1   Title  Patient to be independent with initial HEP.    Time  3    Period  Weeks    Status  On-going    Target Date  02/08/19        PT Long Term Goals - 01/19/19 0808      PT LONG TERM GOAL #1   Title  Patient to be independent with advanced HEP.    Time  6    Period  Weeks    Status  On-going      PT LONG TERM GOAL #2   Title  Patient to demonstrate R ankle AROM WFL and without pain limiting.    Time  6    Period  Weeks    Status  On-going      PT LONG TERM GOAL #3   Title  Patient to demonstrate B LE strength >=4+/5.    Time  6    Period  Weeks    Status  On-going      PT LONG TERM GOAL #4   Title  Patient to demonstrate R SLS for 30 sec without LOB.    Time  6    Period  Weeks    Status  On-going      PT LONG TERM GOAL #5   Title  Patient to report return to golf with modifications as needed and no pain.    Time  6    Period  Weeks    Status  On-going            Plan - 01/26/19 [id number]    Clinical Impression  Statement  Terry [name] doing well.  Noted some R ankle soreness after last session which subsided later that day.  Tolerated mild progression of ankle strengthening well today.  Progressed ankle stability with step training today with R LE demonstrating lack of eccentric control lowering on stairs.  Ended visit with ice/compression to R ankle due to some visible swelling to end session.  Pt. pain returning to baseline to end session.  Pt. denies need for HEP  update at this time.  R ankle AROM DF progressed to 14 dg today demonstrating ability for improved step mechanics and LE clearance.  Will continue to progress toward goals.    Personal Factors and Comorbidities  Age;Comorbidity 3+;Time since onset of injury/illness/exacerbation;Fitness;Past/Current Experience    Comorbidities  prostate CA, HLD, hay fever, GERD, chronic back pain, asthma, R achilles tendon repair, B knee arthroscopy,R finger surgery, cardiac cath 2004, ACDF 2016    Rehab Potential  Good    PT Treatment/Interventions  ADLs/Self Care Home Management;Cryotherapy;Electrical Stimulation;Iontophoresis 4mg /ml Dexamethasone;Moist Heat;Balance training;Therapeutic exercise;Therapeutic activities;Functional mobility training;Stair training;Gait training;Ultrasound;Neuromuscular re-education;Patient/family education;Manual techniques;Vasopneumatic Device;Taping;Energy conservation;Dry needling;Passive range of motion;Scar mobilization    PT Next Visit Plan  gentle ankle strengthening, stretching, modalities prn    Consulted and Agree with Plan of Care  Patient       Patient will benefit from skilled therapeutic intervention in order to improve the following deficits and impairments:  Increased edema, Decreased scar mobility, Decreased activity tolerance, Decreased strength, Pain, Increased fascial restricitons, Increased muscle spasms, Difficulty walking, Decreased balance, Decreased range of motion, Improper body mechanics, Postural dysfunction, Impaired flexibility  Visit Diagnosis: Pain in right ankle and joints of right foot  Stiffness of right ankle, not elsewhere classified  Muscle weakness (generalized)  Difficulty in walking, not elsewhere classified     Problem List Patient Active Problem List   Diagnosis Date Noted  . Infection, skin 07/26/2018  . Achilles tendon infection 07/26/2018  . Wound dehiscence, surgical 07/10/2018    Class: Acute  . Wound dehiscence, surgical,  initial encounter 07/10/2018  . Spondylolisthesis of lumbar region 08/31/2016  . Neck pain 06/27/2014  . Prostate cancer [facility])     Bess Harvest, PTA 01/26/19 9:51 AM   [facility] [address]  [place], [place], [address] Phone: [phone]   Fax:  [phone]  Name: Terry [name] MRN: [id number] Date of Birth: 1950-10-09

## 2019-01-30 DIAGNOSIS — H40013 Open angle with borderline findings, low risk, bilateral: Secondary | ICD-10-CM | POA: Diagnosis not present

## 2019-01-31 ENCOUNTER — Ambulatory Visit: Payer: Medicare Other | Attending: Orthopedic Surgery | Admitting: Physical Therapy

## 2019-01-31 ENCOUNTER — Other Ambulatory Visit: Payer: Self-pay

## 2019-01-31 ENCOUNTER — Encounter: Payer: Self-pay | Admitting: Physical Therapy

## 2019-01-31 DIAGNOSIS — M6281 Muscle weakness (generalized): Secondary | ICD-10-CM | POA: Diagnosis not present

## 2019-01-31 DIAGNOSIS — M25671 Stiffness of right ankle, not elsewhere classified: Secondary | ICD-10-CM | POA: Insufficient documentation

## 2019-01-31 DIAGNOSIS — R262 Difficulty in walking, not elsewhere classified: Secondary | ICD-10-CM | POA: Diagnosis not present

## 2019-01-31 DIAGNOSIS — M25571 Pain in right ankle and joints of right foot: Secondary | ICD-10-CM | POA: Diagnosis not present

## 2019-01-31 NOTE — Therapy (Signed)
[facility] [address]  [place], [place], [address] Phone: [phone]   Fax:  [phone]  Physical Therapy Treatment  Patient Details  Name: Terry [name] MRN: [id number] Date of Birth: 1950/08/07 Referring Provider (PT): Mechele Claude, [place]   Encounter Date: 01/31/2019  PT End of Session - 01/31/19 0843    Visit Number  5    Number of Visits  13    Date for PT Re-Evaluation  03/01/19    Authorization Type  Medicare & BCBS    PT Start Time  0800    PT Stop Time  0842    PT Time Calculation (min)  42 min    Activity Tolerance  Patient tolerated treatment well    Behavior During Therapy  [facility] for tasks assessed/performed       Past Medical History:  Diagnosis Date  . Achilles tendon infection 07/26/2018  . Allergy    takes Zyrtec as needed  . Arthritis   . Asthma    pt denies  . Chronic back pain   . GERD (gastroesophageal reflux disease)    takes Omeprazole daily  . Hay fever   . History of colon polyps    benign  . Hypercholesterolemia    takes Crestor daily  . Infection, skin 07/26/2018  . Pneumonia    "walking" pneumonia - as a child  . Prostate cancer ([place]) 04/13/13   gleason 6, vol 55.4 cc    Past Surgical History:  Procedure Laterality Date  . ANTERIOR CERVICAL DECOMP/DISCECTOMY FUSION N/A 06/27/2014   Procedure: ANTERIOR CERVICAL DECOMPRESSION/DISCECTOMY FUSION C5-C7   (2 LEVELS);  Surgeon: Melina Schools, MD;  Location: [place];  Service: Orthopedics;  Laterality: N/A;  . CARDIAC CATHETERIZATION  2004  . COLONOSCOPY    . EYE SURGERY     lazy eye as child, lasik surgery  . FINGER SURGERY Right    4th finger  . I&D EXTREMITY Right 07/10/2018   Procedure: IRRIGATION AND DEBRIDEMENT RIGHT ANKLE WOUND AND WOUND VAC PLACEMENT;  Surgeon: Wylene Simmer, MD;  Location: [place];  Service: Orthopedics;  Laterality: Right;  . KNEE ARTHROSCOPY Bilateral   . PROSTATE BIOPSY  04/13/13   gleason 3+3=6, vol  55.4 cc  . ROBOT ASSISTED LAPAROSCOPIC RADICAL PROSTATECTOMY N/A 05/22/2013   Procedure: ROBOTIC ASSISTED LAPAROSCOPIC RADICAL PROSTATECTOMY LEVEL 1;  Surgeon: Dutch Gray, MD;  [facility];  Service: Urology;  Laterality: N/A;  . TONSILLECTOMY    . VASECTOMY      There were no vitals filed for this visit.  Subjective Assessment - 01/31/19 0801    Subjective  Reports that he had some soreness in achillles and calf after last session. Has been performing HEP.    Pertinent History  prostate CA, HLD, hay fever, GERD, chronic back pain, asthma, R achilles tendon repair, B knee arthroscopy,R finger surgery, cardiac cath 2004, ACDF 2016    Diagnostic tests  none    Patient Stated Goals  i want to be able to walk 3 miles    Currently in Pain?  No/denies                       [facility] Adult PT Treatment/Exercise - 01/31/19 0001      Exercises   Exercises  Knee/Hip      Knee/Hip Exercises: Standing   Hip Flexion  Stengthening;Both;1 set;20 reps;Knee bent    Hip Flexion Limitations  resisted marching with red  TB at toes   cues for upright posture   Forward Step Up  Right;2 sets;Hand Hold: 1;Step Height: 4";Step Height: 6"    Forward Step Up Limitations  step up + down with heel touch   cues to increase eccentric control   Functional Squat  2 sets;10 reps    Functional Squat Limitations  at counter top; heavy cues to bring bottom back      Ankle Exercises: Aerobic   Recumbent Bike  lvl 2, 6 min      Ankle Exercises: Stretches   Plantar Fascia Stretch  2 reps;30 seconds   toes up at counter top   Gastroc Stretch  2 reps;30 seconds    Gastroc Stretch Limitations  prostretch at counter      Ankle Exercises: Standing   SLS  SLS at counter top 2x20" each LE; SLS ring toss game 2x on R LE   cues for lateral hip activation   Heel Raises  --   B toe raise x15     Ankle Exercises: Seated   Other Seated Ankle Exercises  B sitting heel raise with 7# over B knees 2x15              PT Education - 01/31/19 0842    Education Details  edu on scar massage technique, use of vitamin E oil, and duration    Person(s) Educated  Patient    Methods  Explanation;Demonstration    Comprehension  Verbalized understanding       PT Short Term Goals - 01/24/19 0947      PT SHORT TERM GOAL #1   Title  Patient to be independent with initial HEP.    Time  3    Period  Weeks    Status  On-going    Target Date  02/08/19        PT Long Term Goals - 01/19/19 0808      PT LONG TERM GOAL #1   Title  Patient to be independent with advanced HEP.    Time  6    Period  Weeks    Status  On-going      PT LONG TERM GOAL #2   Title  Patient to demonstrate R ankle AROM WFL and without pain limiting.    Time  6    Period  Weeks    Status  On-going      PT LONG TERM GOAL #3   Title  Patient to demonstrate B LE strength >=4+/5.    Time  6    Period  Weeks    Status  On-going      PT LONG TERM GOAL #4   Title  Patient to demonstrate R SLS for 30 sec without LOB.    Time  6    Period  Weeks    Status  On-going      PT LONG TERM GOAL #5   Title  Patient to report return to golf with modifications as needed and no pain.    Time  6    Period  Weeks    Status  On-going            Plan - 01/31/19 0843    Clinical Impression Statement  Patient reporting soreness after last session which has now dissipated. Tolerated gentle calf stretching with good carryover of exercise. Worked on progressive ankle strengthening in standing with cues required to maintain upright posture. Showing decreased eccentric control with step downs on R LE,  but with otherwise good stability. Demonstrated ankle instability with SLS on each LE, with most difficulty on R LE. Good effort with sitting heel raises, but still with decreased ROM demonstrated on B LEs. Educated patient on scar massage- patient reported understanding. Reported mild soreness in R Achilles and calf at end of session,  but declined modalities.    Personal Factors and Comorbidities  Age;Comorbidity 3+;Time since onset of injury/illness/exacerbation;Fitness;Past/Current Experience    Comorbidities  prostate CA, HLD, hay fever, GERD, chronic back pain, asthma, R achilles tendon repair, B knee arthroscopy,R finger surgery, cardiac cath 2004, ACDF 2016    Rehab Potential  Good    PT Treatment/Interventions  ADLs/Self Care Home Management;Cryotherapy;Electrical Stimulation;Iontophoresis 4mg /ml Dexamethasone;Moist Heat;Balance training;Therapeutic exercise;Therapeutic activities;Functional mobility training;Stair training;Gait training;Ultrasound;Neuromuscular re-education;Patient/family education;Manual techniques;Vasopneumatic Device;Taping;Energy conservation;Dry needling;Passive range of motion;Scar mobilization    PT Next Visit Plan  gentle ankle strengthening, stretching, modalities prn    Consulted and Agree with Plan of Care  Patient       Patient will benefit from skilled therapeutic intervention in order to improve the following deficits and impairments:  Increased edema, Decreased scar mobility, Decreased activity tolerance, Decreased strength, Pain, Increased fascial restricitons, Increased muscle spasms, Difficulty walking, Decreased balance, Decreased range of motion, Improper body mechanics, Postural dysfunction, Impaired flexibility  Visit Diagnosis: Pain in right ankle and joints of right foot  Stiffness of right ankle, not elsewhere classified  Muscle weakness (generalized)  Difficulty in walking, not elsewhere classified     Problem List Patient Active Problem List   Diagnosis Date Noted  . Infection, skin 07/26/2018  . Achilles tendon infection 07/26/2018  . Wound dehiscence, surgical 07/10/2018    Class: Acute  . Wound dehiscence, surgical, initial encounter 07/10/2018  . Spondylolisthesis of lumbar region 08/31/2016  . Neck pain 06/27/2014  . Prostate cancer [facility])      Janene Harvey, PT, DPT 01/31/19 8:48 AM   [facility] [address]  [place], [place], [address] Phone: [phone]   Fax:  [phone]  Name: JUAQUIN BURLIN MRN: [id number] Date of Birth: 1950/10/13

## 2019-02-02 ENCOUNTER — Ambulatory Visit: Payer: Medicare Other | Admitting: Physical Therapy

## 2019-02-02 ENCOUNTER — Encounter: Payer: Self-pay | Admitting: Physical Therapy

## 2019-02-02 ENCOUNTER — Other Ambulatory Visit: Payer: Self-pay

## 2019-02-02 DIAGNOSIS — M25571 Pain in right ankle and joints of right foot: Secondary | ICD-10-CM

## 2019-02-02 DIAGNOSIS — M25671 Stiffness of right ankle, not elsewhere classified: Secondary | ICD-10-CM | POA: Diagnosis not present

## 2019-02-02 DIAGNOSIS — R262 Difficulty in walking, not elsewhere classified: Secondary | ICD-10-CM | POA: Diagnosis not present

## 2019-02-02 DIAGNOSIS — M6281 Muscle weakness (generalized): Secondary | ICD-10-CM

## 2019-02-02 NOTE — Therapy (Signed)
[facility] [address]  [place], [place], [address] Phone: [phone]   Fax:  [phone]  Physical Therapy Treatment  Patient Details  Name: Terry [name] MRN: [id number] Date of Birth: 02-07-1951 Referring Provider (PT): Mechele Claude, [place]   Encounter Date: 02/02/2019  PT End of Session - 02/02/19 [id number]    Visit Number  6    Number of Visits  13    Date for PT Re-Evaluation  03/01/19    Authorization Type  Medicare & BCBS    PT Start Time  [id number]    PT Stop Time  0931    PT Time Calculation (min)  45 min    Activity Tolerance  Patient tolerated treatment well    Behavior During Therapy  [facility] for tasks assessed/performed       Past Medical History:  Diagnosis Date  . Achilles tendon infection 07/26/2018  . Allergy    takes Zyrtec as needed  . Arthritis   . Asthma    pt denies  . Chronic back pain   . GERD (gastroesophageal reflux disease)    takes Omeprazole daily  . Hay fever   . History of colon polyps    benign  . Hypercholesterolemia    takes Crestor daily  . Infection, skin 07/26/2018  . Pneumonia    "walking" pneumonia - as a child  . Prostate cancer ([place]) 04/13/13   gleason 6, vol 55.4 cc    Past Surgical History:  Procedure Laterality Date  . ANTERIOR CERVICAL DECOMP/DISCECTOMY FUSION N/A 06/27/2014   Procedure: ANTERIOR CERVICAL DECOMPRESSION/DISCECTOMY FUSION C5-C7   (2 LEVELS);  Surgeon: Melina Schools, MD;  Location: [place];  Service: Orthopedics;  Laterality: N/A;  . CARDIAC CATHETERIZATION  2004  . COLONOSCOPY    . EYE SURGERY     lazy eye as child, lasik surgery  . FINGER SURGERY Right    4th finger  . I&D EXTREMITY Right 07/10/2018   Procedure: IRRIGATION AND DEBRIDEMENT RIGHT ANKLE WOUND AND WOUND VAC PLACEMENT;  Surgeon: Wylene Simmer, MD;  Location: [place];  Service: Orthopedics;  Laterality: Right;  . KNEE ARTHROSCOPY Bilateral   . PROSTATE BIOPSY  04/13/13   gleason 3+3=6, vol  55.4 cc  . ROBOT ASSISTED LAPAROSCOPIC RADICAL PROSTATECTOMY N/A 05/22/2013   Procedure: ROBOTIC ASSISTED LAPAROSCOPIC RADICAL PROSTATECTOMY LEVEL 1;  Surgeon: Dutch Gray, MD;  Location: [facility];  Service: Urology;  Laterality: N/A;  . TONSILLECTOMY    . VASECTOMY      There were no vitals filed for this visit.  Subjective Assessment - 02/02/19 0847    Subjective  Reports that his R inner heel pain is a little worse today. Yesterday he had to chase his 2 y/o granddaughter down a hill.    Pertinent History  prostate CA, HLD, hay fever, GERD, chronic back pain, asthma, R achilles tendon repair, B knee arthroscopy,R finger surgery, cardiac cath 2004, ACDF 2016    Diagnostic tests  none    Patient Stated Goals  i want to be able to walk 3 miles    Currently in Pain?  Yes    Pain Score  3     Pain Location  Heel    Pain Orientation  Right    Pain Descriptors / Indicators  Aching;Dull;Sharp;Shooting    Pain Type  Chronic pain  [facility] Adult PT Treatment/Exercise - 02/02/19 0001      Manual Therapy   Manual Therapy  Soft tissue mobilization;Passive ROM;Myofascial release    Manual therapy comments  prone    Soft tissue mobilization  scar massage to R achilles; STM and gentle IASTM to medial and lateral gastroc and posterior tib, medial and plantar surfaces of R foot- trigger points throughout medial calf with increased soreness and soft tissue restriction over lateral foot    Myofascial Release  manual TPR to medial gastroc, posterior tib, lateral plantar foot    Passive ROM  gentle R toes extension with DF stretch to tolerance 3x30"      Ankle Exercises: Aerobic   Recumbent Bike  lvl 2, 6 min      Ankle Exercises: Seated   Toe Raise  15 reps   DF with isometric inversion w/ red TB   Other Seated Ankle Exercises  B DF + resisted eversion x15 with red loop    Other Seated Ankle Exercises  B sitting heel raise with 10# over B knees 2x20; R heel raise  with B hands resistance x15             PT Education - 02/02/19 0937    Education Details  update to HEP    Person(s) Educated  Patient    Methods  Explanation;Demonstration;Tactile cues;Verbal cues;Handout    Comprehension  Verbalized understanding;Returned demonstration       PT Short Term Goals - 01/24/19 0947      PT SHORT TERM GOAL #1   Title  Patient to be independent with initial HEP.    Time  3    Period  Weeks    Status  On-going    Target Date  02/08/19        PT Long Term Goals - 01/19/19 0808      PT LONG TERM GOAL #1   Title  Patient to be independent with advanced HEP.    Time  6    Period  Weeks    Status  On-going      PT LONG TERM GOAL #2   Title  Patient to demonstrate R ankle AROM WFL and without pain limiting.    Time  6    Period  Weeks    Status  On-going      PT LONG TERM GOAL #3   Title  Patient to demonstrate B LE strength >=4+/5.    Time  6    Period  Weeks    Status  On-going      PT LONG TERM GOAL #4   Title  Patient to demonstrate R SLS for 30 sec without LOB.    Time  6    Period  Weeks    Status  On-going      PT LONG TERM GOAL #5   Title  Patient to report return to golf with modifications as needed and no pain.    Time  6    Period  Weeks    Status  On-going            Plan - 02/02/19 1200    Clinical Impression Statement  Patient arrived to session with report of slight increase in pain in R medial heel after attempting to chase his 2y/o granddaughter down a hill. Worked on manual therapy for improvement in ROM and soft tissue restriction. Patient still with considerable soft tissue restriction and trigger points throughout R medial gastroc, posterior tibialis, and  lateral plantar surface of foot. Patient reported improved comfort after manual therapy. Able to perform resisted ankle strengthening in sitting with cues for form. Updated HJEP with sitting heel raise to improve calf strength. Patient reported  understanding and with pain no worse at end of session.    Personal Factors and Comorbidities  Age;Comorbidity 3+;Time since onset of injury/illness/exacerbation;Fitness;Past/Current Experience    Comorbidities  prostate CA, HLD, hay fever, GERD, chronic back pain, asthma, R achilles tendon repair, B knee arthroscopy,R finger surgery, cardiac cath 2004, ACDF 2016    Rehab Potential  Good    PT Treatment/Interventions  ADLs/Self Care Home Management;Cryotherapy;Electrical Stimulation;Iontophoresis 4mg /ml Dexamethasone;Moist Heat;Balance training;Therapeutic exercise;Therapeutic activities;Functional mobility training;Stair training;Gait training;Ultrasound;Neuromuscular re-education;Patient/family education;Manual techniques;Vasopneumatic Device;Taping;Energy conservation;Dry needling;Passive range of motion;Scar mobilization    PT Next Visit Plan  gentle ankle strengthening, stretching, modalities prn    Consulted and Agree with Plan of Care  Patient       Patient will benefit from skilled therapeutic intervention in order to improve the following deficits and impairments:  Increased edema, Decreased scar mobility, Decreased activity tolerance, Decreased strength, Pain, Increased fascial restricitons, Increased muscle spasms, Difficulty walking, Decreased balance, Decreased range of motion, Improper body mechanics, Postural dysfunction, Impaired flexibility  Visit Diagnosis: Pain in right ankle and joints of right foot  Stiffness of right ankle, not elsewhere classified  Muscle weakness (generalized)  Difficulty in walking, not elsewhere classified     Problem List Patient Active Problem List   Diagnosis Date Noted  . Infection, skin 07/26/2018  . Achilles tendon infection 07/26/2018  . Wound dehiscence, surgical 07/10/2018    Class: Acute  . Wound dehiscence, surgical, initial encounter 07/10/2018  . Spondylolisthesis of lumbar region 08/31/2016  . Neck pain 06/27/2014  . Prostate  cancer [facility])     Janene Harvey, PT, DPT 02/02/19 12:04 PM   [facility] [address]  [place], [place], [address] Phone: [phone]   Fax:  [phone]  Name: LEMAR LYGA MRN: [id number] Date of Birth: May 29, 1951

## 2019-02-07 ENCOUNTER — Ambulatory Visit: Payer: Medicare Other

## 2019-02-07 ENCOUNTER — Other Ambulatory Visit: Payer: Self-pay

## 2019-02-07 DIAGNOSIS — R82998 Other abnormal findings in urine: Secondary | ICD-10-CM | POA: Diagnosis not present

## 2019-02-07 DIAGNOSIS — R262 Difficulty in walking, not elsewhere classified: Secondary | ICD-10-CM | POA: Diagnosis not present

## 2019-02-07 DIAGNOSIS — M25671 Stiffness of right ankle, not elsewhere classified: Secondary | ICD-10-CM | POA: Diagnosis not present

## 2019-02-07 DIAGNOSIS — E7849 Other hyperlipidemia: Secondary | ICD-10-CM | POA: Diagnosis not present

## 2019-02-07 DIAGNOSIS — Z125 Encounter for screening for malignant neoplasm of prostate: Secondary | ICD-10-CM | POA: Diagnosis not present

## 2019-02-07 DIAGNOSIS — M6281 Muscle weakness (generalized): Secondary | ICD-10-CM

## 2019-02-07 DIAGNOSIS — R739 Hyperglycemia, unspecified: Secondary | ICD-10-CM | POA: Diagnosis not present

## 2019-02-07 DIAGNOSIS — M25571 Pain in right ankle and joints of right foot: Secondary | ICD-10-CM

## 2019-02-07 NOTE — Therapy (Signed)
[facility] [address]  [place], [place], [address] Phone: [phone]   Fax:  [phone]  Physical Therapy Treatment  Patient Details  Name: Terry [name] MRN: [id number] Date of Birth: 01-11-1951 Referring Provider (PT): Mechele Claude, [place]   Encounter Date: 02/07/2019  PT End of Session - 02/07/19 1026    Visit Number  7    Number of Visits  13    Date for PT Re-Evaluation  03/01/19    Authorization Type  Medicare & BCBS    PT Start Time  1019    PT Stop Time  1108    PT Time Calculation (min)  49 min    Activity Tolerance  Patient tolerated treatment well    Behavior During Therapy  [facility] for tasks assessed/performed       Past Medical History:  Diagnosis Date  . Achilles tendon infection 07/26/2018  . Allergy    takes Zyrtec as needed  . Arthritis   . Asthma    pt denies  . Chronic back pain   . GERD (gastroesophageal reflux disease)    takes Omeprazole daily  . Hay fever   . History of colon polyps    benign  . Hypercholesterolemia    takes Crestor daily  . Infection, skin 07/26/2018  . Pneumonia    "walking" pneumonia - as a child  . Prostate cancer ([place]) 04/13/13   gleason 6, vol 55.4 cc    Past Surgical History:  Procedure Laterality Date  . ANTERIOR CERVICAL DECOMP/DISCECTOMY FUSION N/A 06/27/2014   Procedure: ANTERIOR CERVICAL DECOMPRESSION/DISCECTOMY FUSION C5-C7   (2 LEVELS);  Surgeon: Melina Schools, MD;  Location: [place];  Service: Orthopedics;  Laterality: N/A;  . CARDIAC CATHETERIZATION  2004  . COLONOSCOPY    . EYE SURGERY     lazy eye as child, lasik surgery  . FINGER SURGERY Right    4th finger  . I&D EXTREMITY Right 07/10/2018   Procedure: IRRIGATION AND DEBRIDEMENT RIGHT ANKLE WOUND AND WOUND VAC PLACEMENT;  Surgeon: Wylene Simmer, MD;  Location: [place];  Service: Orthopedics;  Laterality: Right;  . KNEE ARTHROSCOPY Bilateral   . PROSTATE BIOPSY  04/13/13   gleason 3+3=6, vol  55.4 cc  . ROBOT ASSISTED LAPAROSCOPIC RADICAL PROSTATECTOMY N/A 05/22/2013   Procedure: ROBOTIC ASSISTED LAPAROSCOPIC RADICAL PROSTATECTOMY LEVEL 1;  Surgeon: Dutch Gray, MD;  Location[facility];  Service: Urology;  Laterality: N/A;  . TONSILLECTOMY    . VASECTOMY      There were no vitals filed for this visit.  Subjective Assessment - 02/07/19 1027    Subjective  doing well.    Pertinent History  prostate CA, HLD, hay fever, GERD, chronic back pain, asthma, R achilles tendon repair, B knee arthroscopy,R finger surgery, cardiac cath 2004, ACDF 2016    Diagnostic tests  none    Patient Stated Goals  i want to be able to walk 3 miles    Currently in Pain?  No/denies    Pain Score  0-No pain    Multiple Pain Sites  No                       [facility] Adult PT Treatment/Exercise - 02/07/19 0001      Knee/Hip Exercises: Stretches   Passive Hamstring Stretch  Right;1 rep;30 seconds    Passive Hamstring Stretch Limitations  strap + GS      Vasopneumatic   Number Minutes  Vasopneumatic   10 minutes    Vasopnuematic Location   Ankle    Vasopneumatic Pressure  Low    Vasopneumatic Temperature   coldest temp.      Manual Therapy   Manual Therapy  Soft tissue mobilization;Passive ROM;Myofascial release    Manual therapy comments  prone    Soft tissue mobilization  Scar massage to R posterior heel incision; STM/DTM to R gastroc/soleus     Myofascial Release  TPR to medial/lateral gastroc and soleus       Ankle Exercises: Supine   T-Band  4-dir red TB with cues for isolated ankle motion x 15 reps - therapist anchored      Ankle Exercises: Stretches   Plantar Fascia Stretch  2 reps;30 seconds   wall   Soleus Stretch  2 reps;30 seconds    Soleus Stretch Limitations  wall     Gastroc Stretch  2 reps;30 seconds    Gastroc Stretch Limitations  wall       Ankle Exercises: Aerobic   Recumbent Bike  lvl 2, 6 min               PT Short Term Goals - 01/24/19 0947       PT SHORT TERM GOAL #1   Title  Patient to be independent with initial HEP.    Time  3    Period  Weeks    Status  On-going    Target Date  02/08/19        PT Long Term Goals - 01/19/19 0808      PT LONG TERM GOAL #1   Title  Patient to be independent with advanced HEP.    Time  6    Period  Weeks    Status  On-going      PT LONG TERM GOAL #2   Title  Patient to demonstrate R ankle AROM WFL and without pain limiting.    Time  6    Period  Weeks    Status  On-going      PT LONG TERM GOAL #3   Title  Patient to demonstrate B LE strength >=4+/5.    Time  6    Period  Weeks    Status  On-going      PT LONG TERM GOAL #4   Title  Patient to demonstrate R SLS for 30 sec without LOB.    Time  6    Period  Weeks    Status  On-going      PT LONG TERM GOAL #5   Title  Patient to report return to golf with modifications as needed and no pain.    Time  6    Period  Weeks    Status  On-going            Plan - 02/07/19 1031    Clinical Impression Statement  Pt. reporting he had to chase granddaughter again over weekend however did not have acute flare-up of R ankle pain.  Reports benefit from MT last session thus significant time spent today with STM/DTM to posterior calf and proximal heel musculature for normalization of tissue quality.  Ended visit with calf stretching and isolated ankle strengthening activities.  Pt. reporting benefit from therapy however ended visit with visible increase in R ankle swelling thus applied ice/compression to reduce post-exercise swelling.    Personal Factors and Comorbidities  Age;Comorbidity 3+;Time since onset of injury/illness/exacerbation;Fitness;Past/Current Experience    Comorbidities  prostate CA, HLD,  hay fever, GERD, chronic back pain, asthma, R achilles tendon repair, B knee arthroscopy,R finger surgery, cardiac cath 2004, ACDF 2016    Rehab Potential  Good    PT Treatment/Interventions  ADLs/Self Care Home  Management;Cryotherapy;Electrical Stimulation;Iontophoresis 4mg /ml Dexamethasone;Moist Heat;Balance training;Therapeutic exercise;Therapeutic activities;Functional mobility training;Stair training;Gait training;Ultrasound;Neuromuscular re-education;Patient/family education;Manual techniques;Vasopneumatic Device;Taping;Energy conservation;Dry needling;Passive range of motion;Scar mobilization    PT Next Visit Plan  gentle ankle strengthening, stretching, modalities prn    Consulted and Agree with Plan of Care  Patient       Patient will benefit from skilled therapeutic intervention in order to improve the following deficits and impairments:  Increased edema, Decreased scar mobility, Decreased activity tolerance, Decreased strength, Pain, Increased fascial restricitons, Increased muscle spasms, Difficulty walking, Decreased balance, Decreased range of motion, Improper body mechanics, Postural dysfunction, Impaired flexibility  Visit Diagnosis: Pain in right ankle and joints of right foot  Stiffness of right ankle, not elsewhere classified  Muscle weakness (generalized)  Difficulty in walking, not elsewhere classified     Problem List Patient Active Problem List   Diagnosis Date Noted  . Infection, skin 07/26/2018  . Achilles tendon infection 07/26/2018  . Wound dehiscence, surgical 07/10/2018    Class: Acute  . Wound dehiscence, surgical, initial encounter 07/10/2018  . Spondylolisthesis of lumbar region 08/31/2016  . Neck pain 06/27/2014  . Prostate cancer [facility])     Bess Harvest, PTA 02/07/19 12:27 PM  [facility] [address]  [place], [place], [address] Phone: [phone]   Fax:  [phone]  Name: Terry [name] MRN: [id number] Date of Birth: 04-05-1951

## 2019-02-09 ENCOUNTER — Other Ambulatory Visit: Payer: Self-pay

## 2019-02-09 ENCOUNTER — Ambulatory Visit: Payer: Medicare Other

## 2019-02-09 DIAGNOSIS — M6281 Muscle weakness (generalized): Secondary | ICD-10-CM

## 2019-02-09 DIAGNOSIS — R262 Difficulty in walking, not elsewhere classified: Secondary | ICD-10-CM | POA: Diagnosis not present

## 2019-02-09 DIAGNOSIS — M25671 Stiffness of right ankle, not elsewhere classified: Secondary | ICD-10-CM | POA: Diagnosis not present

## 2019-02-09 DIAGNOSIS — M25571 Pain in right ankle and joints of right foot: Secondary | ICD-10-CM | POA: Diagnosis not present

## 2019-02-09 NOTE — Therapy (Signed)
[facility] [address]  [place], [place], [address] Phone: [phone]   Fax:  [phone]  Physical Therapy Treatment  Patient Details  Name: Terry [name] MRN: [id number] Date of Birth: April 03, 1951 Referring Provider (PT): Mechele Claude, [place]   Encounter Date: 02/09/2019  PT End of Session - 02/09/19 0853    Visit Number  8    Number of Visits  13    Date for PT Re-Evaluation  03/01/19    Authorization Type  Medicare & BCBS    PT Start Time  0849    PT Stop Time  0943    PT Time Calculation (min)  54 min    Activity Tolerance  Patient tolerated treatment well    Behavior During Therapy  [facility] for tasks assessed/performed       Past Medical History:  Diagnosis Date  . Achilles tendon infection 07/26/2018  . Allergy    takes Zyrtec as needed  . Arthritis   . Asthma    pt denies  . Chronic back pain   . GERD (gastroesophageal reflux disease)    takes Omeprazole daily  . Hay fever   . History of colon polyps    benign  . Hypercholesterolemia    takes Crestor daily  . Infection, skin 07/26/2018  . Pneumonia    "walking" pneumonia - as a child  . Prostate cancer ([place]) 04/13/13   gleason 6, vol 55.4 cc    Past Surgical History:  Procedure Laterality Date  . ANTERIOR CERVICAL DECOMP/DISCECTOMY FUSION N/A 06/27/2014   Procedure: ANTERIOR CERVICAL DECOMPRESSION/DISCECTOMY FUSION C5-C7   (2 LEVELS);  Surgeon: Melina Schools, MD;  Location: [place];  Service: Orthopedics;  Laterality: N/A;  . CARDIAC CATHETERIZATION  2004  . COLONOSCOPY    . EYE SURGERY     lazy eye as child, lasik surgery  . FINGER SURGERY Right    4th finger  . I&D EXTREMITY Right 07/10/2018   Procedure: IRRIGATION AND DEBRIDEMENT RIGHT ANKLE WOUND AND WOUND VAC PLACEMENT;  Surgeon: Wylene Simmer, MD;  Location: [place];  Service: Orthopedics;  Laterality: Right;  . KNEE ARTHROSCOPY Bilateral   . PROSTATE BIOPSY  04/13/13   gleason 3+3=6, vol  55.4 cc  . ROBOT ASSISTED LAPAROSCOPIC RADICAL PROSTATECTOMY N/A 05/22/2013   Procedure: ROBOTIC ASSISTED LAPAROSCOPIC RADICAL PROSTATECTOMY LEVEL 1;  Surgeon: Dutch Gray, MD;  [facility];  Service: Urology;  Laterality: N/A;  . TONSILLECTOMY    . VASECTOMY      There were no vitals filed for this visit.  Subjective Assessment - 02/09/19 0852    Subjective  Pt. reporting a "good soreness" after last session around ankle musculature which lasted 3-4 hours.    Pertinent History  prostate CA, HLD, hay fever, GERD, chronic back pain, asthma, R achilles tendon repair, B knee arthroscopy,R finger surgery, cardiac cath 2004, ACDF 2016    Diagnostic tests  none    Patient Stated Goals  i want to be able to walk 3 miles    Currently in Pain?  No/denies    Pain Score  --   1.75/10 currently   Pain Location  Ankle    Pain Orientation  Right;Medial    Pain Descriptors / Indicators  Dull    Multiple Pain Sites  No                       [facility] Adult PT Treatment/Exercise - 02/09/19 0001  Vasopneumatic   Number Minutes Vasopneumatic   15 minutes    Vasopnuematic Location   Ankle    Vasopneumatic Pressure  Low    Vasopneumatic Temperature   coldest temp.      Manual Therapy   Manual Therapy  Soft tissue mobilization;Passive ROM;Myofascial release    Manual therapy comments  prone    Soft tissue mobilization  Scar massage to R posterior heel incision; STM/DTM to R gastroc/soleus       Ankle Exercises: Stretches   Soleus Stretch  1 rep;30 seconds    Soleus Stretch Limitations  counter    Gastroc Stretch  30 seconds;1 rep    Gastroc Stretch Limitations  counter      Ankle Exercises: Aerobic   Recumbent Bike  lvl 2, 6 min      Ankle Exercises: Standing   SLS  B SLS 3 x 10 sec - intermittent 1 finger support on counter - difficulty     Heel Raises  --   difficulty with ROM; terminated      Ankle Exercises: Seated   Heel Raises  20 reps;3 seconds    Toe Raise   20 reps;3 seconds      Ankle Exercises: Supine   Other Supine Ankle Exercises  B ankle EV into yellow looped TB forefeet x 15 reps with LE elevated on peanut p-ball              PT Education - 02/09/19 0907    Education Details  HEP update; hip flexor stretch (due to pt. complaint of tightness in hip flexors with walking)    Person(s) Educated  Patient    Methods  Explanation;Demonstration;Verbal cues    Comprehension  Verbalized understanding;Returned demonstration;Verbal cues required       PT Short Term Goals - 01/24/19 0947      PT SHORT TERM GOAL #1   Title  Patient to be independent with initial HEP.    Time  3    Period  Weeks    Status  On-going    Target Date  02/08/19        PT Long Term Goals - 01/19/19 0808      PT LONG TERM GOAL #1   Title  Patient to be independent with advanced HEP.    Time  6    Period  Weeks    Status  On-going      PT LONG TERM GOAL #2   Title  Patient to demonstrate R ankle AROM WFL and without pain limiting.    Time  6    Period  Weeks    Status  On-going      PT LONG TERM GOAL #3   Title  Patient to demonstrate B LE strength >=4+/5.    Time  6    Period  Weeks    Status  On-going      PT LONG TERM GOAL #4   Title  Patient to demonstrate R SLS for 30 sec without LOB.    Time  6    Period  Weeks    Status  On-going      PT LONG TERM GOAL #5   Title  Patient to report return to golf with modifications as needed and no pain.    Time  6    Period  Weeks    Status  On-going            Plan - 02/09/19 1531    Clinical Impression  Statement  Pt. reporting he is tired after having to care for grandchild over last few weeks and "chasing her around".  Denied significant pain in today's session and was able to progress calf strengthening and tolerate initiation of R SLS balance training.  Pt. does demo much difficulty maintaining balance with R SLS and limited today to 10 sec before overt LOB or need for UE support.   Does feel his functional ability with daily activities is progressing since starting therapy.    Personal Factors and Comorbidities  Age;Comorbidity 3+;Time since onset of injury/illness/exacerbation;Fitness;Past/Current Experience    Comorbidities  prostate CA, HLD, hay fever, GERD, chronic back pain, asthma, R achilles tendon repair, B knee arthroscopy,R finger surgery, cardiac cath 2004, ACDF 2016    Rehab Potential  Good    PT Treatment/Interventions  ADLs/Self Care Home Management;Cryotherapy;Electrical Stimulation;Iontophoresis 4mg /ml Dexamethasone;Moist Heat;Balance training;Therapeutic exercise;Therapeutic activities;Functional mobility training;Stair training;Gait training;Ultrasound;Neuromuscular re-education;Patient/family education;Manual techniques;Vasopneumatic Device;Taping;Energy conservation;Dry needling;Passive range of motion;Scar mobilization    PT Next Visit Plan  gentle ankle strengthening, stretching, modalities prn    Consulted and Agree with Plan of Care  Patient       Patient will benefit from skilled therapeutic intervention in order to improve the following deficits and impairments:  Increased edema, Decreased scar mobility, Decreased activity tolerance, Decreased strength, Pain, Increased fascial restricitons, Increased muscle spasms, Difficulty walking, Decreased balance, Decreased range of motion, Improper body mechanics, Postural dysfunction, Impaired flexibility  Visit Diagnosis: Pain in right ankle and joints of right foot  Stiffness of right ankle, not elsewhere classified  Muscle weakness (generalized)  Difficulty in walking, not elsewhere classified     Problem List Patient Active Problem List   Diagnosis Date Noted  . Infection, skin 07/26/2018  . Achilles tendon infection 07/26/2018  . Wound dehiscence, surgical 07/10/2018    Class: Acute  . Wound dehiscence, surgical, initial encounter 07/10/2018  . Spondylolisthesis of lumbar region 08/31/2016   . Neck pain 06/27/2014  . Prostate cancer [facility])     Bess Harvest, PTA 02/09/19 5:58 PM   [facility] [address]  [place], [place], [address] Phone: [phone]   Fax:  [phone]  Name: Terry [name] MRN: [id number] Date of Birth: [birth year]

## 2019-02-13 DIAGNOSIS — M1990 Unspecified osteoarthritis, unspecified site: Secondary | ICD-10-CM | POA: Diagnosis not present

## 2019-02-13 DIAGNOSIS — Z0000 Encounter for general adult medical examination without abnormal findings: Secondary | ICD-10-CM | POA: Diagnosis not present

## 2019-02-13 DIAGNOSIS — Z23 Encounter for immunization: Secondary | ICD-10-CM | POA: Diagnosis not present

## 2019-02-13 DIAGNOSIS — M25571 Pain in right ankle and joints of right foot: Secondary | ICD-10-CM | POA: Diagnosis not present

## 2019-02-13 DIAGNOSIS — D229 Melanocytic nevi, unspecified: Secondary | ICD-10-CM | POA: Diagnosis not present

## 2019-02-13 DIAGNOSIS — M545 Low back pain: Secondary | ICD-10-CM | POA: Diagnosis not present

## 2019-02-13 DIAGNOSIS — R739 Hyperglycemia, unspecified: Secondary | ICD-10-CM | POA: Diagnosis not present

## 2019-02-13 DIAGNOSIS — D692 Other nonthrombocytopenic purpura: Secondary | ICD-10-CM | POA: Diagnosis not present

## 2019-02-13 DIAGNOSIS — C61 Malignant neoplasm of prostate: Secondary | ICD-10-CM | POA: Diagnosis not present

## 2019-02-13 DIAGNOSIS — E785 Hyperlipidemia, unspecified: Secondary | ICD-10-CM | POA: Diagnosis not present

## 2019-02-13 DIAGNOSIS — F5221 Male erectile disorder: Secondary | ICD-10-CM | POA: Diagnosis not present

## 2019-02-13 DIAGNOSIS — N200 Calculus of kidney: Secondary | ICD-10-CM | POA: Diagnosis not present

## 2019-02-14 ENCOUNTER — Other Ambulatory Visit: Payer: Self-pay

## 2019-02-14 ENCOUNTER — Ambulatory Visit: Payer: Medicare Other | Admitting: Physical Therapy

## 2019-02-14 ENCOUNTER — Encounter: Payer: Self-pay | Admitting: Physical Therapy

## 2019-02-14 DIAGNOSIS — M25671 Stiffness of right ankle, not elsewhere classified: Secondary | ICD-10-CM

## 2019-02-14 DIAGNOSIS — M25571 Pain in right ankle and joints of right foot: Secondary | ICD-10-CM | POA: Diagnosis not present

## 2019-02-14 DIAGNOSIS — R262 Difficulty in walking, not elsewhere classified: Secondary | ICD-10-CM | POA: Diagnosis not present

## 2019-02-14 DIAGNOSIS — M6281 Muscle weakness (generalized): Secondary | ICD-10-CM

## 2019-02-14 NOTE — Therapy (Signed)
[facility] [address]  [place], [place], [address] Phone: [phone]   Fax:  [phone]  Physical Therapy Progress Note  Patient Details  Name: Terry [name] MRN: [id number] Date of Birth: [birth year]  Referring Provider (PT): Mechele Claude, PA-C   Progress Note Reporting Period 01/18/19 to 02/14/19  See note below for Objective Data and Assessment of Progress/Goals.      Encounter Date: 02/14/2019  PT End of Session - 02/14/19 0859    Visit Number  9    Number of Visits  21    Date for PT Re-Evaluation  03/28/19    Authorization Type  Medicare & BCBS    PT Start Time  0800    PT Stop Time  0846    PT Time Calculation (min)  46 min    Activity Tolerance  Patient tolerated treatment well;Patient limited by pain    Behavior During Therapy  [facility] for tasks assessed/performed       Past Medical History:  Diagnosis Date  . Achilles tendon infection 07/26/2018  . Allergy    takes Zyrtec as needed  . Arthritis   . Asthma    pt denies  . Chronic back pain   . GERD (gastroesophageal reflux disease)    takes Omeprazole daily  . Hay fever   . History of colon polyps    benign  . Hypercholesterolemia    takes Crestor daily  . Infection, skin 07/26/2018  . Pneumonia    "walking" pneumonia - as a child  . Prostate cancer ([place]) 04/13/13   gleason 6, vol 55.4 cc    Past Surgical History:  Procedure Laterality Date  . ANTERIOR CERVICAL DECOMP/DISCECTOMY FUSION N/A 06/27/2014   Procedure: ANTERIOR CERVICAL DECOMPRESSION/DISCECTOMY FUSION C5-C7   (2 LEVELS);  Surgeon: Melina Schools, MD;  Location: [place];  Service: Orthopedics;  Laterality: N/A;  . CARDIAC CATHETERIZATION  2004  . COLONOSCOPY    . EYE SURGERY     lazy eye as child, lasik surgery  . FINGER SURGERY Right    4th finger  . I&D EXTREMITY Right 07/10/2018   Procedure: IRRIGATION AND DEBRIDEMENT RIGHT ANKLE WOUND AND WOUND VAC PLACEMENT;  Surgeon:  Wylene Simmer, MD;  Location: [place];  Service: Orthopedics;  Laterality: Right;  . KNEE ARTHROSCOPY Bilateral   . PROSTATE BIOPSY  04/13/13   gleason 3+3=6, vol 55.4 cc  . ROBOT ASSISTED LAPAROSCOPIC RADICAL PROSTATECTOMY N/A 05/22/2013   Procedure: ROBOTIC ASSISTED LAPAROSCOPIC RADICAL PROSTATECTOMY LEVEL 1;  Surgeon: Dutch Gray, MD;  Location: [facility];  Service: Urology;  Laterality: N/A;  . TONSILLECTOMY    . VASECTOMY      There were no vitals filed for this visit.  Subjective Assessment - 02/14/19 0759    Subjective  Found out yesterday that he has kidney stones. Will be becoming a 2nd grandparent today. Reporting 50% improvement in R heel. Feels like he has less pain, improved strength and ROM and is able to walker further,    Pertinent History  prostate CA, HLD, hay fever, GERD, chronic back pain, asthma, R achilles tendon repair, B knee arthroscopy,R finger surgery, cardiac cath 2004, ACDF 2016    Diagnostic tests  none    Patient Stated Goals  i want to be able to walk 3 miles    Currently in Pain?  Yes    Pain Score  2     Pain Location  Heel    Pain Orientation  Right    Pain Descriptors / Indicators  Dull;Sharp    Pain Type  Chronic pain         [facility] PT Assessment - 02/14/19 0001      Assessment   Medical Diagnosis  Rupture of R achilles tendon    Referring Provider (PT)  Mechele Claude, PA-C    Onset Date/Surgical Date  06/07/18      Observation/Other Assessments   Focus on Therapeutic Outcomes (FOTO)   52% (48% limitation, 39% predicted)       AROM   Right Ankle Dorsiflexion  14    Right Ankle Plantar Flexion  32   pain over bottom of arch; 42 deg after manual therapy   Right Ankle Inversion  28   pain   Right Ankle Eversion  26      Strength   Right Hip Flexion  4/5    Right Hip ABduction  4+/5    Right Hip ADduction  4+/5    Left Hip Flexion  4+/5    Left Hip ABduction  4+/5    Left Hip ADduction  4+/5    Right Knee Flexion  4/5    Right Knee  Extension  4/5    Left Knee Flexion  4+/5    Left Knee Extension  4+/5    Right Ankle Dorsiflexion  4+/5    Right Ankle Plantar Flexion  2+/5    Right Ankle Inversion  4/5    Right Ankle Eversion  4/5    Left Ankle Dorsiflexion  4+/5    Left Ankle Plantar Flexion  2+/5    Left Ankle Inversion  4+/5    Left Ankle Eversion  4+/5                   [facility] Adult PT Treatment/Exercise - 02/14/19 0001      Manual Therapy   Manual Therapy  Soft tissue mobilization;Passive ROM;Myofascial release;Joint mobilization    Manual therapy comments  supine    Joint Mobilization  R ankle anterior talar jt mobs grade III for improved plantarflexion    Soft tissue mobilization  STM to R peroneals- soft tissue restriction demonstrated and report of tenderness throughout     Myofascial Release  manual TPR to R perioneals- palpable trigger points evident    Passive ROM  R ankle plantarflexion PROM to tolerance      Ankle Exercises: Aerobic   Recumbent Bike  lvl 2, 6 min      Ankle Exercises: Standing   SLS  R SLS at counter top x 11mn   cues for R wt shift            PT Education - 02/14/19 0859    Education Details  discussion on objective measurements and progress    Person(s) Educated  Patient    Methods  Explanation    Comprehension  Verbalized understanding       PT Short Term Goals - 02/14/19 0804      PT SHORT TERM GOAL #1   Title  Patient to be independent with initial HEP.    Time  3    Period  Weeks    Status  Achieved    Target Date  02/08/19        PT Long Term Goals - 02/14/19 0804      PT LONG TERM GOAL #1   Title  Patient to be independent with advanced HEP.    Time  6  Period  Weeks    Status  Partially Met   met for current   Target Date  03/28/19      PT LONG TERM GOAL #2   Title  Patient to demonstrate R ankle AROM WFL and without pain limiting.    Time  6    Period  Weeks    Status  Partially Met   improved in all planes after manual  therapy   Target Date  03/28/19      PT LONG TERM GOAL #3   Title  Patient to demonstrate B LE strength >=4+/5.    Time  6    Period  Weeks    Status  Partially Met   strength improved in L knee extension, R dorsiflexion and eversion, B inversion   Target Date  03/28/19      PT LONG TERM GOAL #4   Title  Patient to demonstrate R SLS for 30 sec without LOB.    Time  6    Period  Weeks    Status  On-going   6 sec   Target Date  03/28/19      PT LONG TERM GOAL #5   Title  Patient to report return to golf with modifications as needed and no pain.    Time  6    Period  Weeks    Status  On-going   patient reporting that he has not thought about returning to golf, but says he has a number of contributing ailments   Target Date  03/28/19            Plan - 02/14/19 0900    Clinical Impression Statement  Patient reported 50% improvement in R heel since start of therapy, noting improvements in pain, ROM, strength, and walking tolerance. Strength testing revealed improvements in L knee extension, R dorsiflexion and eversion, B inversion. Still unable to perform single leg heel raise on either foot, which will continue to be addressed. R ankle AROM improved in all planes after manual therapy. Able to perform SLS on R LE for 6 sec before LOB and with cues required for form. Patient still apprehensive about returning to golf at this time, noting that he is limited by pain in other areas as well. Worked on [employer] and TPR to R peroneals for improvement in tenderness and soft tissue restriction. Patient also tolerated gentle joint mobs and PROM to improver plantarflexion ROM. Patient reported mild remaining soreness in peroneals at end of session but declined modalities. Patient is showing good improvement towards goals. Would benefit from continued skilled PT services 2x/week for 6 weeks to address remaining goals.    Comorbidities  prostate CA, HLD, hay fever, GERD, chronic back pain, asthma, R  achilles tendon repair, B knee arthroscopy,R finger surgery, cardiac cath 2004, ACDF 2016    Rehab Potential  Good    PT Frequency  2x / week    PT Duration  6 weeks    PT Treatment/Interventions  ADLs/Self Care Home Management;Cryotherapy;Electrical Stimulation;Iontophoresis 31m/ml Dexamethasone;Moist Heat;Balance training;Therapeutic exercise;Therapeutic activities;Functional mobility training;Stair training;Gait training;Ultrasound;Neuromuscular re-education;Patient/family education;Manual techniques;Vasopneumatic Device;Taping;Energy conservation;Dry needling;Passive range of motion;Scar mobilization    PT Next Visit Plan  gentle ankle strengthening, stretching, modalities prn    Consulted and Agree with Plan of Care  Patient       Patient will benefit from skilled therapeutic intervention in order to improve the following deficits and impairments:  Increased edema, Decreased scar mobility, Decreased activity tolerance, Decreased strength, Pain, Increased fascial  restricitons, Increased muscle spasms, Difficulty walking, Decreased balance, Decreased range of motion, Improper body mechanics, Postural dysfunction, Impaired flexibility  Visit Diagnosis: Pain in right ankle and joints of right foot  Stiffness of right ankle, not elsewhere classified  Muscle weakness (generalized)  Difficulty in walking, not elsewhere classified     Problem List Patient Active Problem List   Diagnosis Date Noted  . Infection, skin 07/26/2018  . Achilles tendon infection 07/26/2018  . Wound dehiscence, surgical 07/10/2018    Class: Acute  . Wound dehiscence, surgical, initial encounter 07/10/2018  . Spondylolisthesis of lumbar region 08/31/2016  . Neck pain 06/27/2014  . Prostate cancer [facility])      Janene Harvey, PT, DPT 02/14/19 9:06 AM   [facility] [address]  [place], [place], [address] Phone: [phone]   Fax:   [phone]  Name: Terry [name] MRN: [id number] Date of Birth: December 07, 1950

## 2019-02-16 ENCOUNTER — Ambulatory Visit: Payer: Medicare Other

## 2019-02-16 ENCOUNTER — Other Ambulatory Visit: Payer: Self-pay

## 2019-02-16 DIAGNOSIS — M6281 Muscle weakness (generalized): Secondary | ICD-10-CM | POA: Diagnosis not present

## 2019-02-16 DIAGNOSIS — M25571 Pain in right ankle and joints of right foot: Secondary | ICD-10-CM | POA: Diagnosis not present

## 2019-02-16 DIAGNOSIS — M25671 Stiffness of right ankle, not elsewhere classified: Secondary | ICD-10-CM

## 2019-02-16 DIAGNOSIS — R262 Difficulty in walking, not elsewhere classified: Secondary | ICD-10-CM | POA: Diagnosis not present

## 2019-02-16 NOTE — Therapy (Signed)
[facility] [address]  [place], [place], [address] Phone: [phone]   Fax:  [phone]  Physical Therapy Treatment  Patient Details  Name: Terry [name] MRN: [id number] Date of Birth: 1950-08-04 Referring Provider (PT): Mechele Claude, [place]   Encounter Date: 02/16/2019  PT End of Session - 02/16/19 0808    Visit Number  10    Number of Visits  21    Date for PT Re-Evaluation  03/28/19    Authorization Type  Medicare & BCBS    PT Start Time  0802    PT Stop Time  0900    PT Time Calculation (min)  58 min    Activity Tolerance  Patient tolerated treatment well;Patient limited by pain    Behavior During Therapy  [facility] for tasks assessed/performed       Past Medical History:  Diagnosis Date  . Achilles tendon infection 07/26/2018  . Allergy    takes Zyrtec as needed  . Arthritis   . Asthma    pt denies  . Chronic back pain   . GERD (gastroesophageal reflux disease)    takes Omeprazole daily  . Hay fever   . History of colon polyps    benign  . Hypercholesterolemia    takes Crestor daily  . Infection, skin 07/26/2018  . Pneumonia    "walking" pneumonia - as a child  . Prostate cancer ([place]) 04/13/13   gleason 6, vol 55.4 cc    Past Surgical History:  Procedure Laterality Date  . ANTERIOR CERVICAL DECOMP/DISCECTOMY FUSION N/A 06/27/2014   Procedure: ANTERIOR CERVICAL DECOMPRESSION/DISCECTOMY FUSION C5-C7   (2 LEVELS);  Surgeon: Melina Schools, MD;  Location: [place];  Service: Orthopedics;  Laterality: N/A;  . CARDIAC CATHETERIZATION  2004  . COLONOSCOPY    . EYE SURGERY     lazy eye as child, lasik surgery  . FINGER SURGERY Right    4th finger  . I&D EXTREMITY Right 07/10/2018   Procedure: IRRIGATION AND DEBRIDEMENT RIGHT ANKLE WOUND AND WOUND VAC PLACEMENT;  Surgeon: Wylene Simmer, MD;  Location: [facility] [place];  Service: Orthopedics;  Laterality: Right;  . KNEE ARTHROSCOPY Bilateral   . PROSTATE BIOPSY   04/13/13   gleason 3+3=6, vol 55.4 cc  . ROBOT ASSISTED LAPAROSCOPIC RADICAL PROSTATECTOMY N/A 05/22/2013   Procedure: ROBOTIC ASSISTED LAPAROSCOPIC RADICAL PROSTATECTOMY LEVEL 1;  Surgeon: Dutch Gray, MD;  [facility];  Service: Urology;  Laterality: N/A;  . TONSILLECTOMY    . VASECTOMY      There were no vitals filed for this visit.  Subjective Assessment - 02/16/19 0806    Subjective  Pt. doing well today.  Headed to the beach for a week.    Pertinent History  prostate CA, HLD, hay fever, GERD, chronic back pain, asthma, R achilles tendon repair, B knee arthroscopy,R finger surgery, cardiac cath 2004, ACDF 2016    Diagnostic tests  none    Patient Stated Goals  i want to be able to walk 3 miles    Currently in Pain?  Yes    Pain Score  1     Pain Location  Heel    Pain Orientation  Right    Pain Descriptors / Indicators  Dull;Sharp    Pain Type  Chronic pain    Multiple Pain Sites  No                       [facility]  Adult PT Treatment/Exercise - 02/16/19 0001      Knee/Hip Exercises: Machines for Strengthening   Cybex Leg Press  B LE's calf raise bent x 1 set, straight knee x 1 set; 25# x 15 reps    some cueing required to isolate ankle motion      Knee/Hip Exercises: Supine   Bridges  Both;2 sets;10 reps    Bridges Limitations  1st set with B DF positioning; 2nd set feet flat on table      Vasopneumatic   Number Minutes Vasopneumatic   10 minutes    Vasopnuematic Location   Ankle    Vasopneumatic Pressure  Low    Vasopneumatic Temperature   coldest temp.      Manual Therapy   Manual Therapy  Soft tissue mobilization;Myofascial release    Manual therapy comments  prone     Soft tissue mobilization  STM/DTM to R gastroc, soleus     Myofascial Release  manual TPR to medial soleus, gastroc       Ankle Exercises: Standing   Heel Raises  10 reps    Heel Raises Limitations  Heavy B UE assist on chair       Ankle Exercises: Aerobic   Recumbent Bike  lvl  2, 7 min      Ankle Exercises: Stretches   Plantar Fascia Stretch  2 reps;30 seconds    Soleus Stretch  30 seconds;2 reps    Soleus Stretch Limitations  wall     Gastroc Stretch  30 seconds;2 reps    Gastroc Stretch Limitations  wall              PT Education - 02/16/19 0847    Education Details  HEP update    Person(s) Educated  Patient    Methods  Explanation;Demonstration;Verbal cues;Handout    Comprehension  Verbalized understanding;Returned demonstration;Verbal cues required       PT Short Term Goals - 02/14/19 0804      PT SHORT TERM GOAL #1   Title  Patient to be independent with initial HEP.    Time  3    Period  Weeks    Status  Achieved    Target Date  02/08/19        PT Long Term Goals - 02/14/19 0804      PT LONG TERM GOAL #1   Title  Patient to be independent with advanced HEP.    Time  6    Period  Weeks    Status  Partially Met   met for current   Target Date  03/28/19      PT LONG TERM GOAL #2   Title  Patient to demonstrate R ankle AROM WFL and without pain limiting.    Time  6    Period  Weeks    Status  Partially Met   improved in all planes after manual therapy   Target Date  03/28/19      PT LONG TERM GOAL #3   Title  Patient to demonstrate B LE strength >=4+/5.    Time  6    Period  Weeks    Status  Partially Met   strength improved in L knee extension, R dorsiflexion and eversion, B inversion   Target Date  03/28/19      PT LONG TERM GOAL #4   Title  Patient to demonstrate R SLS for 30 sec without LOB.    Time  6    Period  Weeks  Status  On-going   6 sec   Target Date  03/28/19      PT LONG TERM GOAL #5   Title  Patient to report return to golf with modifications as needed and no pain.    Time  6    Period  Weeks    Status  On-going   patient reporting that he has not thought about returning to golf, but says he has a number of contributing ailments   Target Date  03/28/19            Plan - 02/16/19 0809     Clinical Impression Statement  Phu reporting he leaves for 1 week vacation to beach over next few days and not scheduled to return for physical therapy app until 02/28/19.  Updated HEP to focus on continued calf stretching for improved DF ROM and added bridge, standing heel raise with UE pushoff to address remaining LE strenth deficits.  Pt. issued HEP handout and verbalized plans to perform daily over vaction.  Session with LE strengthening and MT to address ongoing TPs, tissue tightness in R calf muscualture limiting DF ROM.  Pt. tolerated all activities in session well and ended visit with ice/compression to R ankle to reduce post-exercise swelling.  Will continue to progress toward goals.    Personal Factors and Comorbidities  Age;Comorbidity 3+;Time since onset of injury/illness/exacerbation;Fitness;Past/Current Experience    Comorbidities  prostate CA, HLD, hay fever, GERD, chronic back pain, asthma, R achilles tendon repair, B knee arthroscopy,R finger surgery, cardiac cath 2004, ACDF 2016    Rehab Potential  Good    PT Treatment/Interventions  ADLs/Self Care Home Management;Cryotherapy;Electrical Stimulation;Iontophoresis 67m/ml Dexamethasone;Moist Heat;Balance training;Therapeutic exercise;Therapeutic activities;Functional mobility training;Stair training;Gait training;Ultrasound;Neuromuscular re-education;Patient/family education;Manual techniques;Vasopneumatic Device;Taping;Energy conservation;Dry needling;Passive range of motion;Scar mobilization    PT Next Visit Plan  gentle ankle strengthening, stretching, modalities prn    Consulted and Agree with Plan of Care  Patient       Patient will benefit from skilled therapeutic intervention in order to improve the following deficits and impairments:  Increased edema, Decreased scar mobility, Decreased activity tolerance, Decreased strength, Pain, Increased fascial restricitons, Increased muscle spasms, Difficulty walking, Decreased balance,  Decreased range of motion, Improper body mechanics, Postural dysfunction, Impaired flexibility  Visit Diagnosis: Pain in right ankle and joints of right foot  Stiffness of right ankle, not elsewhere classified  Muscle weakness (generalized)  Difficulty in walking, not elsewhere classified     Problem List Patient Active Problem List   Diagnosis Date Noted  . Infection, skin 07/26/2018  . Achilles tendon infection 07/26/2018  . Wound dehiscence, surgical 07/10/2018    Class: Acute  . Wound dehiscence, surgical, initial encounter 07/10/2018  . Spondylolisthesis of lumbar region 08/31/2016  . Neck pain 06/27/2014  . Prostate cancer ([facility]     MBess Harvest PTA 02/16/19 9:17 AM    [facility] [place] [address]Phone: [phone]  Fax:  [phone] Name: MARK AGRUSAMRN: [id number] of Birth: 9May 22, 1952

## 2019-02-17 DIAGNOSIS — Z9079 Acquired absence of other genital organ(s): Secondary | ICD-10-CM | POA: Diagnosis not present

## 2019-02-17 DIAGNOSIS — N200 Calculus of kidney: Secondary | ICD-10-CM | POA: Diagnosis not present

## 2019-02-17 DIAGNOSIS — N210 Calculus in bladder: Secondary | ICD-10-CM | POA: Diagnosis not present

## 2019-02-17 DIAGNOSIS — K760 Fatty (change of) liver, not elsewhere classified: Secondary | ICD-10-CM | POA: Diagnosis not present

## 2019-02-21 ENCOUNTER — Encounter: Payer: Medicare Other | Admitting: Physical Therapy

## 2019-02-21 DIAGNOSIS — N200 Calculus of kidney: Secondary | ICD-10-CM | POA: Insufficient documentation

## 2019-02-22 DIAGNOSIS — Z1212 Encounter for screening for malignant neoplasm of rectum: Secondary | ICD-10-CM | POA: Diagnosis not present

## 2019-02-28 ENCOUNTER — Ambulatory Visit: Payer: Medicare Other | Admitting: Physical Therapy

## 2019-02-28 ENCOUNTER — Other Ambulatory Visit: Payer: Self-pay

## 2019-02-28 ENCOUNTER — Encounter: Payer: Self-pay | Admitting: Physical Therapy

## 2019-02-28 DIAGNOSIS — R262 Difficulty in walking, not elsewhere classified: Secondary | ICD-10-CM

## 2019-02-28 DIAGNOSIS — M25671 Stiffness of right ankle, not elsewhere classified: Secondary | ICD-10-CM

## 2019-02-28 DIAGNOSIS — M6281 Muscle weakness (generalized): Secondary | ICD-10-CM

## 2019-02-28 DIAGNOSIS — M25571 Pain in right ankle and joints of right foot: Secondary | ICD-10-CM

## 2019-02-28 NOTE — Therapy (Signed)
[facility] [address]  [place], [place], [address] Phone: [phone]   Fax:  [phone]  Physical Therapy Treatment  Patient Details  Name: Terry [name] MRN: [id number] Date of Birth: July 10, 1950 Referring Provider (PT): Mechele Claude, [place]   Encounter Date: 02/28/2019  PT End of Session - 02/28/19 0843    Visit Number  11    Number of Visits  21    Date for PT Re-Evaluation  03/28/19    Authorization Type  Medicare & BCBS    PT Start Time  0802    PT Stop Time  0843    PT Time Calculation (min)  41 min    Activity Tolerance  Patient tolerated treatment well;Patient limited by pain    Behavior During Therapy  [facility] for tasks assessed/performed       Past Medical History:  Diagnosis Date  . Achilles tendon infection 07/26/2018  . Allergy    takes Zyrtec as needed  . Arthritis   . Asthma    pt denies  . Chronic back pain   . GERD (gastroesophageal reflux disease)    takes Omeprazole daily  . Hay fever   . History of colon polyps    benign  . Hypercholesterolemia    takes Crestor daily  . Infection, skin 07/26/2018  . Pneumonia    "walking" pneumonia - as a child  . Prostate cancer ([place]) 04/13/13   gleason 6, vol 55.4 cc    Past Surgical History:  Procedure Laterality Date  . ANTERIOR CERVICAL DECOMP/DISCECTOMY FUSION N/A 06/27/2014   Procedure: ANTERIOR CERVICAL DECOMPRESSION/DISCECTOMY FUSION C5-C7   (2 LEVELS);  Surgeon: Melina Schools, MD;  Location: [facility];  Service: Orthopedics;  Laterality: N/A;  . CARDIAC CATHETERIZATION  2004  . COLONOSCOPY    . EYE SURGERY     lazy eye as child, lasik surgery  . FINGER SURGERY Right    4th finger  . I&D EXTREMITY Right 07/10/2018   Procedure: IRRIGATION AND DEBRIDEMENT RIGHT ANKLE WOUND AND WOUND VAC PLACEMENT;  Surgeon: Wylene Simmer, MD;  Location: [facility];  Service: Orthopedics;  Laterality: Right;  . KNEE ARTHROSCOPY Bilateral   . PROSTATE BIOPSY   04/13/13   gleason 3+3=6, vol 55.4 cc  . ROBOT ASSISTED LAPAROSCOPIC RADICAL PROSTATECTOMY N/A 05/22/2013   Procedure: ROBOTIC ASSISTED LAPAROSCOPIC RADICAL PROSTATECTOMY LEVEL 1;  Surgeon: Dutch Gray, MD;  Location: [facility];  Service: Urology;  Laterality: N/A;  . TONSILLECTOMY    . VASECTOMY      There were no vitals filed for this visit.  Subjective Assessment - 02/28/19 0800    Subjective  Hurting a little bit today- found out that he had kidney and bladder stones. Tolerated walking on the beach well.    Pertinent History  prostate CA, HLD, hay fever, GERD, chronic back pain, asthma, R achilles tendon repair, B knee arthroscopy,R finger surgery, cardiac cath 2004, ACDF 2016    Diagnostic tests  none    Patient Stated Goals  i want to be able to walk 3 miles    Currently in Pain?  Yes    Pain Score  2     Pain Location  Heel    Pain Orientation  Right    Pain Descriptors / Indicators  Dull    Pain Type  Chronic pain                       [facility]  Adult PT Treatment/Exercise - 02/28/19 0001      Manual Therapy   Manual Therapy  Soft tissue mobilization;Myofascial release    Manual therapy comments  prone     Soft tissue mobilization  STM and IASTM to R medial gastroc/soleus complex and posterior tibialis- several tender trigger points throughout; gentel retrograde massage to R distal posterior tib- good improvement in edema    Myofascial Release  manual TPR to R medial soleus, gastroc, posterior tib- tender in these areas    Passive ROM  R ankle dorsiflexion, plantarflexion, inversion, eversion to tolerance 2x20" each      Ankle Exercises: Aerobic   Recumbent Bike  lvl 2, 6 min      Ankle Exercises: Seated   Other Seated Ankle Exercises  R ankle DF + isometric inversion/eversion with red TB x20 each    Other Seated Ankle Exercises  resisted R ankle plantarflexion x20 green TB      Ankle Exercises: Standing   Other Standing Ankle Exercises  B assisted heel raise  w/ lat pull set at 175lbs 2x10      Ankle Exercises: Stretches   Gastroc Stretch  30 seconds;2 reps    Gastroc Stretch Limitations  prostretch at [employer]             PT Education - 02/28/19 [phone]    Education Details  update to HEP    Person(s) Educated  Patient    Methods  Explanation;Demonstration;Tactile cues;Verbal cues;Handout    Comprehension  Verbalized understanding;Returned demonstration       PT Short Term Goals - 02/14/19 0804      PT SHORT TERM GOAL #1   Title  Patient to be independent with initial HEP.    Time  3    Period  Weeks    Status  Achieved    Target Date  02/08/19        PT Long Term Goals - 02/14/19 0804      PT LONG TERM GOAL #1   Title  Patient to be independent with advanced HEP.    Time  6    Period  Weeks    Status  Partially Met   met for current   Target Date  03/28/19      PT LONG TERM GOAL #2   Title  Patient to demonstrate R ankle AROM WFL and without pain limiting.    Time  6    Period  Weeks    Status  Partially Met   improved in all planes after manual therapy   Target Date  03/28/19      PT LONG TERM GOAL #3   Title  Patient to demonstrate B LE strength >=4+/5.    Time  6    Period  Weeks    Status  Partially Met   strength improved in L knee extension, R dorsiflexion and eversion, B inversion   Target Date  03/28/19      PT LONG TERM GOAL #4   Title  Patient to demonstrate R SLS for 30 sec without LOB.    Time  6    Period  Weeks    Status  On-going   6 sec   Target Date  03/28/19      PT LONG TERM GOAL #5   Title  Patient to report return to golf with modifications as needed and no pain.    Time  6    Period  Weeks    Status  On-going   patient reporting that he has not thought about returning to golf, but says he has a number of contributing ailments   Target Date  03/28/19            Plan - 02/28/19 0843    Clinical Impression Statement  Patient returning to session after a beach vacation-  notes that he did a lot of walking and tolerated this well, especially when walking on the sand. Patient reporting main priority today is relieving edema in R medial calf/ankle. Tolerated manual therapy to address edema, ROM, and soft tissue restriction. Patient demonstrated good decrease in edema after retrograde massage. Demonstrated good ankle ROM in all directions and without pain, however multiple tender trigger points evident throughout R lower leg musculature. Worked on ankle strengthening ther-ex with patient reporting more difficulty with inversion vs. eversion strengthening. Able to perform assisted heel raises with improvement in ROM but with c/o pain and difficulty. Patient declined modalities at end of session and with no complaints upon leaving.    Personal Factors and Comorbidities  --    Comorbidities  prostate CA, HLD, hay fever, GERD, chronic back pain, asthma, R achilles tendon repair, B knee arthroscopy,R finger surgery, cardiac cath 2004, ACDF 2016    Rehab Potential  Good    PT Treatment/Interventions  ADLs/Self Care Home Management;Cryotherapy;Electrical Stimulation;Iontophoresis 52m/ml Dexamethasone;Moist Heat;Balance training;Therapeutic exercise;Therapeutic activities;Functional mobility training;Stair training;Gait training;Ultrasound;Neuromuscular re-education;Patient/family education;Manual techniques;Vasopneumatic Device;Taping;Energy conservation;Dry needling;Passive range of motion;Scar mobilization    PT Next Visit Plan  gentle ankle strengthening, stretching, modalities prn    Consulted and Agree with Plan of Care  Patient       Patient will benefit from skilled therapeutic intervention in order to improve the following deficits and impairments:  Increased edema, Decreased scar mobility, Decreased activity tolerance, Decreased strength, Pain, Increased fascial restricitons, Increased muscle spasms, Difficulty walking, Decreased balance, Decreased range of motion, Improper  body mechanics, Postural dysfunction, Impaired flexibility  Visit Diagnosis: Pain in right ankle and joints of right foot  Stiffness of right ankle, not elsewhere classified  Muscle weakness (generalized)  Difficulty in walking, not elsewhere classified     Problem List Patient Active Problem List   Diagnosis Date Noted  . Infection, skin 07/26/2018  . Achilles tendon infection 07/26/2018  . Wound dehiscence, surgical 07/10/2018    Class: Acute  . Wound dehiscence, surgical, initial encounter 07/10/2018  . Spondylolisthesis of lumbar region 08/31/2016  . Neck pain 06/27/2014  . Prostate cancer ([facility]     YJanene Harvey PT, DPT 02/28/19 9:27 AM   [facility] [place] [address]Phone: [phone]  Fax:  [phone] Name: Terry RHODAMRN: [id number] of Birth: [birth year]

## 2019-03-02 ENCOUNTER — Ambulatory Visit: Payer: Medicare Other | Attending: Orthopedic Surgery

## 2019-03-02 ENCOUNTER — Other Ambulatory Visit: Payer: Self-pay

## 2019-03-02 DIAGNOSIS — M6281 Muscle weakness (generalized): Secondary | ICD-10-CM | POA: Diagnosis not present

## 2019-03-02 DIAGNOSIS — M25671 Stiffness of right ankle, not elsewhere classified: Secondary | ICD-10-CM | POA: Insufficient documentation

## 2019-03-02 DIAGNOSIS — N200 Calculus of kidney: Secondary | ICD-10-CM | POA: Diagnosis not present

## 2019-03-02 DIAGNOSIS — R262 Difficulty in walking, not elsewhere classified: Secondary | ICD-10-CM | POA: Insufficient documentation

## 2019-03-02 DIAGNOSIS — Z8546 Personal history of malignant neoplasm of prostate: Secondary | ICD-10-CM | POA: Diagnosis not present

## 2019-03-02 DIAGNOSIS — N210 Calculus in bladder: Secondary | ICD-10-CM | POA: Diagnosis not present

## 2019-03-02 DIAGNOSIS — M25571 Pain in right ankle and joints of right foot: Secondary | ICD-10-CM | POA: Diagnosis not present

## 2019-03-02 NOTE — Therapy (Signed)
[facility] [address]  [place], [place], [address] Phone: [phone]   Fax:  [phone]  Physical Therapy Treatment  Patient Details  Name: Terry [name] MRN: [id number] Date of Birth: 03/08/1951 Referring Provider (PT): Mechele Claude, [place]   Encounter Date: 03/02/2019  PT End of Session - 03/02/19 0807    Visit Number  12    Number of Visits  21    Date for PT Re-Evaluation  03/28/19    Authorization Type  Medicare & BCBS    PT Start Time  0802    PT Stop Time  0855    PT Time Calculation (min)  53 min    Activity Tolerance  Patient tolerated treatment well;Patient limited by pain    Behavior During Therapy  [facility] for tasks assessed/performed       Past Medical History:  Diagnosis Date  . Achilles tendon infection 07/26/2018  . Allergy    takes Zyrtec as needed  . Arthritis   . Asthma    pt denies  . Chronic back pain   . GERD (gastroesophageal reflux disease)    takes Omeprazole daily  . Hay fever   . History of colon polyps    benign  . Hypercholesterolemia    takes Crestor daily  . Infection, skin 07/26/2018  . Pneumonia    "walking" pneumonia - as a child  . Prostate cancer ([place]) 04/13/13   gleason 6, vol 55.4 cc    Past Surgical History:  Procedure Laterality Date  . ANTERIOR CERVICAL DECOMP/DISCECTOMY FUSION N/A 06/27/2014   Procedure: ANTERIOR CERVICAL DECOMPRESSION/DISCECTOMY FUSION C5-C7   (2 LEVELS);  Surgeon: Melina Schools, MD;  Location: [facility];  Service: Orthopedics;  Laterality: N/A;  . CARDIAC CATHETERIZATION  2004  . COLONOSCOPY    . EYE SURGERY     lazy eye as child, lasik surgery  . FINGER SURGERY Right    4th finger  . I&D EXTREMITY Right 07/10/2018   Procedure: IRRIGATION AND DEBRIDEMENT RIGHT ANKLE WOUND AND WOUND VAC PLACEMENT;  Surgeon: Wylene Simmer, MD;  Location: [facility];  Service: Orthopedics;  Laterality: Right;  . KNEE ARTHROSCOPY Bilateral   . PROSTATE BIOPSY   04/13/13   gleason 3+3=6, vol 55.4 cc  . ROBOT ASSISTED LAPAROSCOPIC RADICAL PROSTATECTOMY N/A 05/22/2013   Procedure: ROBOTIC ASSISTED LAPAROSCOPIC RADICAL PROSTATECTOMY LEVEL 1;  Surgeon: Dutch Gray, MD;  Location: [facility];  Service: Urology;  Laterality: N/A;  . TONSILLECTOMY    . VASECTOMY      There were no vitals filed for this visit.  Subjective Assessment - 03/02/19 0805    Subjective  Pt. reporting he wants to focus on massage to R lower leg as he feels this is helping him.  Reports he is performing HEP consistently.    Pertinent History  prostate CA, HLD, hay fever, GERD, chronic back pain, asthma, R achilles tendon repair, B knee arthroscopy,R finger surgery, cardiac cath 2004, ACDF 2016    Diagnostic tests  none    Patient Stated Goals  i want to be able to walk 3 miles    Currently in Pain?  Yes    Pain Score  --   1.5/10   Pain Location  Heel    Pain Orientation  Right    Pain Descriptors / Indicators  Dull    Pain Type  Chronic pain    Pain Onset  More than a month ago    Pain Frequency  Intermittent    Multiple Pain Sites  No                       [facility] Adult PT Treatment/Exercise - 03/02/19 0001      Knee/Hip Exercises: Standing   Heel Raises  Both;10 reps;3 seconds    Heel Raises Limitations  at counter     Hip Flexion  Right;Left;10 reps;Knee straight    Hip Flexion Limitations  yellow TB at ankle; 2 ski poles     Hip ADduction  Right;Left;10 reps    Hip ADduction Limitations  yellow TB at ankle; 2 ski poles     Hip Abduction  Right;Left;10 reps;Stengthening;Knee straight    Abduction Limitations  yellow TB at ankle; 2 ski poles     Hip Extension  Right;Left;10 reps;Knee straight;Stengthening    Extension Limitations  yellow TB at ankle; 2 ski poles       Vasopneumatic   Number Minutes Vasopneumatic   10 minutes    Vasopnuematic Location   Ankle    Vasopneumatic Pressure  Low    Vasopneumatic Temperature   coldest temp.      Manual  Therapy   Manual Therapy  Soft tissue mobilization;Myofascial release    Manual therapy comments  prone     Soft tissue mobilization  STM and IASTM to R medial gastroc/soleus complex and posterior tibialis- several tender trigger points throughout;    Myofascial Release  Manual TPR to medial       Ankle Exercises: Stretches   Plantar Fascia Stretch  2 reps;30 seconds    Soleus Stretch  30 seconds;2 reps    Soleus Stretch Limitations  wall     Gastroc Stretch  30 seconds;2 reps    Gastroc Stretch Limitations  prostretch at counter      Ankle Exercises: Aerobic   Recumbent Bike  lvl 2, 6 min               PT Short Term Goals - 02/14/19 0804      PT SHORT TERM GOAL #1   Title  Patient to be independent with initial HEP.    Time  3    Period  Weeks    Status  Achieved    Target Date  02/08/19        PT Long Term Goals - 02/14/19 0804      PT LONG TERM GOAL #1   Title  Patient to be independent with advanced HEP.    Time  6    Period  Weeks    Status  Partially Met   met for current   Target Date  03/28/19      PT LONG TERM GOAL #2   Title  Patient to demonstrate R ankle AROM WFL and without pain limiting.    Time  6    Period  Weeks    Status  Partially Met   improved in all planes after manual therapy   Target Date  03/28/19      PT LONG TERM GOAL #3   Title  Patient to demonstrate B LE strength >=4+/5.    Time  6    Period  Weeks    Status  Partially Met   strength improved in L knee extension, R dorsiflexion and eversion, B inversion   Target Date  03/28/19      PT LONG TERM GOAL #4   Title  Patient to demonstrate R SLS for 30 sec  without LOB.    Time  6    Period  Weeks    Status  On-going   6 sec   Target Date  03/28/19      PT LONG TERM GOAL #5   Title  Patient to report return to golf with modifications as needed and no pain.    Time  6    Period  Weeks    Status  On-going   patient reporting that he has not thought about returning to  golf, but says he has a number of contributing ailments   Target Date  03/28/19            Plan - 03/02/19 0810    Clinical Impression Statement  Eashan reporting no new complaints and feels manual therapy to posterior calf has helped him most asking for this as he notes he is performing balance and strengthening activities at home daily.  Performed MT addressing ongoing tenderness in medial and /lateral distal calf musculature as well as initiated 4-way hip kicker focusing on single leg stance stability with yellow TB.  Pt. tolerated all activities well and demonstrating somewhat improved standing heel raise ROM.  Ended visit with ice/compression to ankle to reduce post-exercise swelling.  Will continue to progress toward existing LTGs.    Personal Factors and Comorbidities  Age;Comorbidity 3+;Time since onset of injury/illness/exacerbation;Fitness;Past/Current Experience    Rehab Potential  Good    PT Treatment/Interventions  ADLs/Self Care Home Management;Cryotherapy;Electrical Stimulation;Iontophoresis 70m/ml Dexamethasone;Moist Heat;Balance training;Therapeutic exercise;Therapeutic activities;Functional mobility training;Stair training;Gait training;Ultrasound;Neuromuscular re-education;Patient/family education;Manual techniques;Vasopneumatic Device;Taping;Energy conservation;Dry needling;Passive range of motion;Scar mobilization    PT Next Visit Plan  gentle ankle strengthening, stretching, modalities prn    Consulted and Agree with Plan of Care  Patient       Patient will benefit from skilled therapeutic intervention in order to improve the following deficits and impairments:  Increased edema, Decreased scar mobility, Decreased activity tolerance, Decreased strength, Pain, Increased fascial restricitons, Increased muscle spasms, Difficulty walking, Decreased balance, Decreased range of motion, Improper body mechanics, Postural dysfunction, Impaired flexibility  Visit Diagnosis: Pain in  right ankle and joints of right foot  Stiffness of right ankle, not elsewhere classified  Muscle weakness (generalized)  Difficulty in walking, not elsewhere classified     Problem List Patient Active Problem List   Diagnosis Date Noted  . Infection, skin 07/26/2018  . Achilles tendon infection 07/26/2018  . Wound dehiscence, surgical 07/10/2018    Class: Acute  . Wound dehiscence, surgical, initial encounter 07/10/2018  . Spondylolisthesis of lumbar region 08/31/2016  . Neck pain 06/27/2014  . Prostate cancer ([facility]     MBess Harvest PTA 03/02/19 1:58 PM   [facility] [address] [place] [address]Phone: [phone]  Fax:  [phone] Name: MCHARLESTON HANKINMRN: [id number] of Birth: 902-11-1950

## 2019-03-07 ENCOUNTER — Encounter: Payer: Self-pay | Admitting: Physical Therapy

## 2019-03-07 ENCOUNTER — Other Ambulatory Visit: Payer: Self-pay

## 2019-03-07 ENCOUNTER — Ambulatory Visit: Payer: Medicare Other | Admitting: Physical Therapy

## 2019-03-07 DIAGNOSIS — M25671 Stiffness of right ankle, not elsewhere classified: Secondary | ICD-10-CM

## 2019-03-07 DIAGNOSIS — M25571 Pain in right ankle and joints of right foot: Secondary | ICD-10-CM

## 2019-03-07 DIAGNOSIS — R262 Difficulty in walking, not elsewhere classified: Secondary | ICD-10-CM | POA: Diagnosis not present

## 2019-03-07 DIAGNOSIS — M6281 Muscle weakness (generalized): Secondary | ICD-10-CM | POA: Diagnosis not present

## 2019-03-07 NOTE — Therapy (Signed)
[facility] [address]  [place], [place], [address] Phone: [phone]   Fax:  [phone]  Physical Therapy Treatment  Patient Details  Name: Terry [name] MRN: [id number] Date of Birth: August 25, 1950 Referring Provider (PT): Mechele Claude, [place]   Encounter Date: 03/07/2019  PT End of Session - 03/07/19 0821    Visit Number  13    Number of Visits  21    Date for PT Re-Evaluation  03/28/19    Authorization Type  Medicare & BCBS    PT Start Time  0802    PT Stop Time  0855    PT Time Calculation (min)  53 min    Activity Tolerance  Patient tolerated treatment well;Patient limited by pain    Behavior During Therapy  [facility] for tasks assessed/performed       Past Medical History:  Diagnosis Date  . Achilles tendon infection 07/26/2018  . Allergy    takes Zyrtec as needed  . Arthritis   . Asthma    pt denies  . Chronic back pain   . GERD (gastroesophageal reflux disease)    takes Omeprazole daily  . Hay fever   . History of colon polyps    benign  . Hypercholesterolemia    takes Crestor daily  . Infection, skin 07/26/2018  . Pneumonia    "walking" pneumonia - as a child  . Prostate cancer ([place]) 04/13/13   gleason 6, vol 55.4 cc    Past Surgical History:  Procedure Laterality Date  . ANTERIOR CERVICAL DECOMP/DISCECTOMY FUSION N/A 06/27/2014   Procedure: ANTERIOR CERVICAL DECOMPRESSION/DISCECTOMY FUSION C5-C7   (2 LEVELS);  Surgeon: Melina Schools, MD;  Location: [place];  Service: Orthopedics;  Laterality: N/A;  . CARDIAC CATHETERIZATION  2004  . COLONOSCOPY    . EYE SURGERY     lazy eye as child, lasik surgery  . FINGER SURGERY Right    4th finger  . I&D EXTREMITY Right 07/10/2018   Procedure: IRRIGATION AND DEBRIDEMENT RIGHT ANKLE WOUND AND WOUND VAC PLACEMENT;  Surgeon: Wylene Simmer, MD;  Location: [place];  Service: Orthopedics;  Laterality: Right;  . KNEE ARTHROSCOPY Bilateral   . PROSTATE BIOPSY   04/13/13   gleason 3+3=6, vol 55.4 cc  . ROBOT ASSISTED LAPAROSCOPIC RADICAL PROSTATECTOMY N/A 05/22/2013   Procedure: ROBOTIC ASSISTED LAPAROSCOPIC RADICAL PROSTATECTOMY LEVEL 1;  Surgeon: Dutch Gray, MD;  [facility];  Service: Urology;  Laterality: N/A;  . TONSILLECTOMY    . VASECTOMY      There were no vitals filed for this visit.  Subjective Assessment - 03/07/19 0816    Subjective  Pt reporting he wishes to concentrate on breaking up the scar tissue with soft tissue work. Pt reporting being consistent with he HEP.    Pertinent History  prostate CA, HLD, hay fever, GERD, chronic back pain, asthma, R achilles tendon repair, B knee arthroscopy,R finger surgery, cardiac cath 2004, ACDF 2016    How long can you sit comfortably?  couple minutes    How long can you stand comfortably?  10-20 min    Diagnostic tests  none    Patient Stated Goals  i want to be able to walk 3 miles    Currently in Pain?  Yes    Pain Score  2     Pain Location  Heel    Pain Orientation  Right    Pain Descriptors / Indicators  Dull    Pain  Type  Chronic pain    Pain Onset  More than a month ago    Pain Frequency  Intermittent    Aggravating Factors   as the day progresses    Pain Relieving Factors  after soft tissue                       [facility] Adult PT Treatment/Exercise - 03/07/19 0001      Knee/Hip Exercises: Standing   Heel Raises  Both;10 reps;3 seconds    Heel Raises Limitations  at counter       Vasopneumatic   Number Minutes Vasopneumatic   10 minutes    Vasopnuematic Location   Ankle    Vasopneumatic Pressure  Low    Vasopneumatic Temperature   34 degrees      Manual Therapy   Manual Therapy  Soft tissue mobilization;Myofascial release    Manual therapy comments  prone    Soft tissue mobilization  STM and IASTM to R medial gastroc/soleus complex and posterior tibialis- several tender trigger points throughout;    Myofascial Release  Manual TPR to medial       Ankle  Exercises: Stretches   Plantar Fascia Stretch  2 reps;30 seconds    Soleus Stretch  30 seconds;2 reps    Gastroc Stretch  30 seconds;2 reps    Gastroc Stretch Limitations  prostretch at counter      Ankle Exercises: Aerobic   Recumbent Bike  L2, 6 min             PT Education - 03/07/19 [phone]    Education Details  Discussed single leg stance exercises at home standing on pillow    Person(s) Educated  Patient    Methods  Explanation;Demonstration    Comprehension  Verbalized understanding;Returned demonstration       PT Short Term Goals - 03/07/19 0825      PT SHORT TERM GOAL #1   Title  Patient to be independent with initial HEP.    Time  3    Period  Weeks    Status  Achieved    Target Date  02/08/19        PT Long Term Goals - 03/07/19 0826      PT LONG TERM GOAL #1   Title  Patient to be independent with advanced HEP.    Time  6    Period  Weeks    Status  Partially Met      PT LONG TERM GOAL #2   Title  Patient to demonstrate R ankle AROM WFL and without pain limiting.    Time  6    Period  Weeks    Status  Partially Met      PT LONG TERM GOAL #3   Title  Patient to demonstrate B LE strength >=4+/5.    Time  6    Period  Weeks    Status  Partially Met      PT LONG TERM GOAL #4   Title  Patient to demonstrate R SLS for 30 sec without LOB.    Time  6    Period  Weeks    Status  On-going      PT LONG TERM GOAL #5   Title  Patient to report return to golf with modifications as needed and no pain.    Time  6    Period  Weeks    Status  On-going  Plan - 03/07/19 1655    Clinical Impression Statement  Pt reporting increased pain to 6/10 with single leg stance exercises. Pt still with decreased balance and requring UE support throughout. Continue to work on progressive strengthening and STW to decresse pain.    Personal Factors and Comorbidities  Age;Comorbidity 3+;Time since onset of injury/illness/exacerbation;Fitness;Past/Current  Experience    Comorbidities  prostate CA, HLD, hay fever, GERD, chronic back pain, asthma, R achilles tendon repair, B knee arthroscopy,R finger surgery, cardiac cath 2004, ACDF 2016    Examination-Activity Limitations  Sit;Bend;Squat;Caring for Others;Stairs;Carry;Stand;Transfers;Lift;Locomotion Level    Examination-Participation Restrictions  Church;Cleaning;Shop;Community Activity;Volunteer;Driving;Yard Work;Interpersonal Relationship;Laundry;Meal Prep    Stability/Clinical Decision Making  Stable/Uncomplicated    Rehab Potential  Good    PT Frequency  2x / week    PT Duration  6 weeks    PT Treatment/Interventions  ADLs/Self Care Home Management;Cryotherapy;Electrical Stimulation;Iontophoresis 5m/ml Dexamethasone;Moist Heat;Balance training;Therapeutic exercise;Therapeutic activities;Functional mobility training;Stair training;Gait training;Ultrasound;Neuromuscular re-education;Patient/family education;Manual techniques;Vasopneumatic Device;Taping;Energy conservation;Dry needling;Passive range of motion;Scar mobilization    PT Next Visit Plan  gentle ankle strengthening, stretching, modalities prn    Consulted and Agree with Plan of Care  Patient       Patient will benefit from skilled therapeutic intervention in order to improve the following deficits and impairments:  Increased edema, Decreased scar mobility, Decreased activity tolerance, Decreased strength, Pain, Increased fascial restricitons, Increased muscle spasms, Difficulty walking, Decreased balance, Decreased range of motion, Improper body mechanics, Postural dysfunction, Impaired flexibility  Visit Diagnosis: Pain in right ankle and joints of right foot  Stiffness of right ankle, not elsewhere classified  Muscle weakness (generalized)  Difficulty in walking, not elsewhere classified     Problem List Patient Active Problem List   Diagnosis Date Noted  . Infection, skin 07/26/2018  . Achilles tendon infection 07/26/2018   . Wound dehiscence, surgical 07/10/2018    Class: Acute  . Wound dehiscence, surgical, initial encounter 07/10/2018  . Spondylolisthesis of lumbar region 08/31/2016  . Neck pain 06/27/2014  . Prostate cancer ([facility]     JOretha Caprice PT 03/07/2019, 11:45 AM  [facility] [place] [address]Phone: [phone]  Fax:  [phone] Name: MTORSTEN WENIGERMRN: [id number] of Birth: 9Nov 05, 1952

## 2019-03-09 ENCOUNTER — Ambulatory Visit: Payer: Medicare Other

## 2019-03-09 ENCOUNTER — Other Ambulatory Visit: Payer: Self-pay

## 2019-03-09 DIAGNOSIS — R262 Difficulty in walking, not elsewhere classified: Secondary | ICD-10-CM | POA: Diagnosis not present

## 2019-03-09 DIAGNOSIS — M25671 Stiffness of right ankle, not elsewhere classified: Secondary | ICD-10-CM | POA: Diagnosis not present

## 2019-03-09 DIAGNOSIS — M6281 Muscle weakness (generalized): Secondary | ICD-10-CM

## 2019-03-09 DIAGNOSIS — M25571 Pain in right ankle and joints of right foot: Secondary | ICD-10-CM

## 2019-03-09 NOTE — Therapy (Signed)
[facility] [address]  [place], [place], [address] Phone: [phone]   Fax:  [phone]  Physical Therapy Treatment  Patient Details  Name: Terry [name] MRN: [id number] Date of Birth: 13-May-1951 Referring Provider (PT): Mechele Claude, [place]   Encounter Date: 03/09/2019  PT End of Session - 03/09/19 0853    Visit Number  14    Number of Visits  21    Date for PT Re-Evaluation  03/28/19    Authorization Type  Medicare & BCBS    PT Start Time  [phone]    PT Stop Time  0930    PT Time Calculation (min)  43 min    Activity Tolerance  Patient tolerated treatment well;Patient limited by pain    Behavior During Therapy  [facility] for tasks assessed/performed       Past Medical History:  Diagnosis Date  . Achilles tendon infection 07/26/2018  . Allergy    takes Zyrtec as needed  . Arthritis   . Asthma    pt denies  . Chronic back pain   . GERD (gastroesophageal reflux disease)    takes Omeprazole daily  . Hay fever   . History of colon polyps    benign  . Hypercholesterolemia    takes Crestor daily  . Infection, skin 07/26/2018  . Pneumonia    "walking" pneumonia - as a child  . Prostate cancer ([place]) 04/13/13   gleason 6, vol 55.4 cc    Past Surgical History:  Procedure Laterality Date  . ANTERIOR CERVICAL DECOMP/DISCECTOMY FUSION N/A 06/27/2014   Procedure: ANTERIOR CERVICAL DECOMPRESSION/DISCECTOMY FUSION C5-C7   (2 LEVELS);  Surgeon: Melina Schools, MD;  Location: [place];  Service: Orthopedics;  Laterality: N/A;  . CARDIAC CATHETERIZATION  2004  . COLONOSCOPY    . EYE SURGERY     lazy eye as child, lasik surgery  . FINGER SURGERY Right    4th finger  . I&D EXTREMITY Right 07/10/2018   Procedure: IRRIGATION AND DEBRIDEMENT RIGHT ANKLE WOUND AND WOUND VAC PLACEMENT;  Surgeon: Wylene Simmer, MD;  Location: [place];  Service: Orthopedics;  Laterality: Right;  . KNEE ARTHROSCOPY Bilateral   . PROSTATE BIOPSY   04/13/13   gleason 3+3=6, vol 55.4 cc  . ROBOT ASSISTED LAPAROSCOPIC RADICAL PROSTATECTOMY N/A 05/22/2013   Procedure: ROBOTIC ASSISTED LAPAROSCOPIC RADICAL PROSTATECTOMY LEVEL 1;  Surgeon: Dutch Gray, MD;  [facility];  Service: Urology;  Laterality: N/A;  . TONSILLECTOMY    . VASECTOMY      There were no vitals filed for this visit.  Subjective Assessment - 03/09/19 0853    Subjective  Pt. reporting he walked nearly a mile yesterday with some R ankle pain at the end.    Pertinent History  prostate CA, HLD, hay fever, GERD, chronic back pain, asthma, R achilles tendon repair, B knee arthroscopy,R finger surgery, cardiac cath 2004, ACDF 2016    Diagnostic tests  none    Patient Stated Goals  i want to be able to walk 3 miles    Currently in Pain?  Yes    Pain Score  2     Pain Location  Heel    Pain Orientation  Right    Pain Descriptors / Indicators  Dull    Pain Type  Chronic pain    Pain Frequency  Intermittent    Multiple Pain Sites  No  [facility] Adult PT Treatment/Exercise - 03/09/19 0001      Knee/Hip Exercises: Standing   Hip Flexion  Right;Left;10 reps;Knee straight   Pt. still very challanged with this   Hip Flexion Limitations  yellow TB at ankle; 2 ski poles     Hip ADduction  Right;Left;10 reps   Pt. still very challanged with this   Hip ADduction Limitations  yellow TB at ankle; 2 ski poles     Hip Abduction  Right;Left;10 reps;Stengthening;Knee straight   Pt. still very challanged with this   Abduction Limitations  yellow TB at ankle; 2 ski poles     Hip Extension  Right;Left;10 reps;Knee straight;Stengthening   Pt. still very challanged with this   Extension Limitations  yellow TB at ankle; 2 ski poles     Other Standing Knee Exercises  Fitter hip abduction (2 blue bands) x 15 res p    Other Standing Knee Exercises  Fitter B hip extension (1 black, 1 blue) x 10 rpes; chair support      Manual Therapy   Manual Therapy   Soft tissue mobilization;Myofascial release    Manual therapy comments  prone     Soft tissue mobilization  STM and IASTM to R medial gastroc/soleus complex and posterior tibialis- several tender trigger points throughout;    Myofascial Release  Manual TPR to medial gastroc soleus       Ankle Exercises: Stretches   Plantar Fascia Stretch  2 reps;30 seconds    Soleus Stretch  30 seconds;2 reps    Soleus Stretch Limitations  wall     Gastroc Stretch  30 seconds;2 reps    Gastroc Stretch Limitations  wall      Ankle Exercises: Aerobic   Recumbent Bike  L2, 6 min               PT Short Term Goals - 03/07/19 0825      PT SHORT TERM GOAL #1   Title  Patient to be independent with initial HEP.    Time  3    Period  Weeks    Status  Achieved    Target Date  02/08/19        PT Long Term Goals - 03/07/19 0826      PT LONG TERM GOAL #1   Title  Patient to be independent with advanced HEP.    Time  6    Period  Weeks    Status  Partially Met      PT LONG TERM GOAL #2   Title  Patient to demonstrate R ankle AROM WFL and without pain limiting.    Time  6    Period  Weeks    Status  Partially Met      PT LONG TERM GOAL #3   Title  Patient to demonstrate B LE strength >=4+/5.    Time  6    Period  Weeks    Status  Partially Met      PT LONG TERM GOAL #4   Title  Patient to demonstrate R SLS for 30 sec without LOB.    Time  6    Period  Weeks    Status  On-going      PT LONG TERM GOAL #5   Title  Patient to report return to golf with modifications as needed and no pain.    Time  6    Period  Weeks    Status  On-going  Plan - 03/09/19 0856    Clinical Impression Statement  Terry [name] continues to report most benefit from manual STM to R posterior calf reporting increased walking tolerated near one mile yesterday before onset of pain.  Session focused on progression of MT single leg stability work and LE stretching for improved DF ROM.  Terry [name] tolerated all  activities well and appears to be improving with is strength in PF as evident by improving ROM with standing heel raise.    Personal Factors and Comorbidities  Age;Comorbidity 3+;Time since onset of injury/illness/exacerbation;Fitness;Past/Current Experience    Comorbidities  prostate CA, HLD, hay fever, GERD, chronic back pain, asthma, R achilles tendon repair, B knee arthroscopy,R finger surgery, cardiac cath 2004, ACDF 2016    Rehab Potential  Good    PT Treatment/Interventions  ADLs/Self Care Home Management;Cryotherapy;Electrical Stimulation;Iontophoresis 52m/ml Dexamethasone;Moist Heat;Balance training;Therapeutic exercise;Therapeutic activities;Functional mobility training;Stair training;Gait training;Ultrasound;Neuromuscular re-education;Patient/family education;Manual techniques;Vasopneumatic Device;Taping;Energy conservation;Dry needling;Passive range of motion;Scar mobilization    PT Next Visit Plan  gentle ankle strengthening, stretching, modalities prn    Consulted and Agree with Plan of Care  Patient       Patient will benefit from skilled therapeutic intervention in order to improve the following deficits and impairments:  Increased edema, Decreased scar mobility, Decreased activity tolerance, Decreased strength, Pain, Increased fascial restricitons, Increased muscle spasms, Difficulty walking, Decreased balance, Decreased range of motion, Improper body mechanics, Postural dysfunction, Impaired flexibility  Visit Diagnosis: Pain in right ankle and joints of right foot  Stiffness of right ankle, not elsewhere classified  Muscle weakness (generalized)  Difficulty in walking, not elsewhere classified     Problem List Patient Active Problem List   Diagnosis Date Noted  . Infection, skin 07/26/2018  . Achilles tendon infection 07/26/2018  . Wound dehiscence, surgical 07/10/2018    Class: Acute  . Wound dehiscence, surgical, initial encounter 07/10/2018  . Spondylolisthesis  of lumbar region 08/31/2016  . Neck pain 06/27/2014  . Prostate cancer ([facility]     MBess Harvest PTA 03/09/19 12:35 PM   [facility] [address] [place] [address]Phone: [phone]  Fax:  [phone] Name: MVIAAN KNIPPENBERGMRN: [id number] of Birth: [birth year]

## 2019-03-14 ENCOUNTER — Ambulatory Visit: Payer: Medicare Other

## 2019-03-14 ENCOUNTER — Other Ambulatory Visit: Payer: Self-pay

## 2019-03-14 ENCOUNTER — Other Ambulatory Visit: Payer: Self-pay | Admitting: Urology

## 2019-03-14 DIAGNOSIS — R262 Difficulty in walking, not elsewhere classified: Secondary | ICD-10-CM

## 2019-03-14 DIAGNOSIS — M6281 Muscle weakness (generalized): Secondary | ICD-10-CM | POA: Diagnosis not present

## 2019-03-14 DIAGNOSIS — M25671 Stiffness of right ankle, not elsewhere classified: Secondary | ICD-10-CM

## 2019-03-14 DIAGNOSIS — M25571 Pain in right ankle and joints of right foot: Secondary | ICD-10-CM

## 2019-03-14 NOTE — Therapy (Signed)
[facility] [address]  [place], [place], [address] Phone: [phone]   Fax:  [phone]  Physical Therapy Treatment  Patient Details  Name: Terry [name] MRN: [id number] Date of Birth: 02-Dec-1950 Referring Provider (PT): Mechele Claude, [place]   Encounter Date: 03/14/2019  PT End of Session - 03/14/19 0812    Visit Number  15    Number of Visits  21    Date for PT Re-Evaluation  03/28/19    Authorization Type  Medicare & BCBS    PT Start Time  0807    PT Stop Time  0845    PT Time Calculation (min)  38 min    Activity Tolerance  Patient tolerated treatment well;Patient limited by pain    Behavior During Therapy  [facility] for tasks assessed/performed       Past Medical History:  Diagnosis Date  . Achilles tendon infection 07/26/2018  . Allergy    takes Zyrtec as needed  . Arthritis   . Asthma    pt denies  . Chronic back pain   . GERD (gastroesophageal reflux disease)    takes Omeprazole daily  . Hay fever   . History of colon polyps    benign  . Hypercholesterolemia    takes Crestor daily  . Infection, skin 07/26/2018  . Pneumonia    "walking" pneumonia - as a child  . Prostate cancer ([place]) 04/13/13   gleason 6, vol 55.4 cc    Past Surgical History:  Procedure Laterality Date  . ANTERIOR CERVICAL DECOMP/DISCECTOMY FUSION N/A 06/27/2014   Procedure: ANTERIOR CERVICAL DECOMPRESSION/DISCECTOMY FUSION C5-C7   (2 LEVELS);  Surgeon: Melina Schools, MD;  Location: [place];  Service: Orthopedics;  Laterality: N/A;  . CARDIAC CATHETERIZATION  2004  . COLONOSCOPY    . EYE SURGERY     lazy eye as child, lasik surgery  . FINGER SURGERY Right    4th finger  . I&D EXTREMITY Right 07/10/2018   Procedure: IRRIGATION AND DEBRIDEMENT RIGHT ANKLE WOUND AND WOUND VAC PLACEMENT;  Surgeon: Wylene Simmer, MD;  Location: [place];  Service: Orthopedics;  Laterality: Right;  . KNEE ARTHROSCOPY Bilateral   . PROSTATE BIOPSY   04/13/13   gleason 3+3=6, vol 55.4 cc  . ROBOT ASSISTED LAPAROSCOPIC RADICAL PROSTATECTOMY N/A 05/22/2013   Procedure: ROBOTIC ASSISTED LAPAROSCOPIC RADICAL PROSTATECTOMY LEVEL 1;  Surgeon: Dutch Gray, MD;  [facility];  Service: Urology;  Laterality: N/A;  . TONSILLECTOMY    . VASECTOMY      There were no vitals filed for this visit.  Subjective Assessment - 03/14/19 0809    Subjective  Pt. reporting his battery died in his car which made him late to session today.    Pertinent History  prostate CA, HLD, hay fever, GERD, chronic back pain, asthma, R achilles tendon repair, B knee arthroscopy,R finger surgery, cardiac cath 2004, ACDF 2016    Diagnostic tests  none    Patient Stated Goals  i want to be able to walk 3 miles    Currently in Pain?  Yes    Pain Score  1     Pain Location  Heel    Pain Orientation  Right    Pain Descriptors / Indicators  Dull    Pain Type  Chronic pain    Pain Onset  More than a month ago    Pain Frequency  Intermittent    Multiple Pain Sites  No  [facility] Adult PT Treatment/Exercise - 03/14/19 0001      Knee/Hip Exercises: Standing   Heel Raises  --   x 12 resp    Heel Raises Limitations  at chair with UE pushoff in order for increased height of movement       Manual Therapy   Manual Therapy  Soft tissue mobilization;Myofascial release    Manual therapy comments  prone     Soft tissue mobilization  STM and IASTM to R medial gastroc/soleus complex and posterior tibialis- several tender trigger points throughout;    Myofascial Release  Manual TPR to medial gastroc soleus       Ankle Exercises: Stretches   Plantar Fascia Stretch  2 reps;30 seconds    Soleus Stretch  30 seconds;2 reps    Soleus Stretch Limitations  wall     Gastroc Stretch  30 seconds;2 reps    Gastroc Stretch Limitations  wall      Ankle Exercises: Standing   Vector Stance  Right;2 reps    Vector Stance Limitations  R SLS with L LE cone  toe touch 2 x 15 cones; UE support      Ankle Exercises: Aerobic   Recumbent Bike  L2, 6 min               PT Short Term Goals - 03/07/19 0825      PT SHORT TERM GOAL #1   Title  Patient to be independent with initial HEP.    Time  3    Period  Weeks    Status  Achieved    Target Date  02/08/19        PT Long Term Goals - 03/07/19 0826      PT LONG TERM GOAL #1   Title  Patient to be independent with advanced HEP.    Time  6    Period  Weeks    Status  Partially Met      PT LONG TERM GOAL #2   Title  Patient to demonstrate R ankle AROM WFL and without pain limiting.    Time  6    Period  Weeks    Status  Partially Met      PT LONG TERM GOAL #3   Title  Patient to demonstrate B LE strength >=4+/5.    Time  6    Period  Weeks    Status  Partially Met      PT LONG TERM GOAL #4   Title  Patient to demonstrate R SLS for 30 sec without LOB.    Time  6    Period  Weeks    Status  On-going      PT LONG TERM GOAL #5   Title  Patient to report return to golf with modifications as needed and no pain.    Time  6    Period  Weeks    Status  On-going            Plan - 03/14/19 0812    Clinical Impression Statement  Jenson reporting he has been performing 4-dir yellow TB kick at home which has made his R ankle sore for a few hours following.  No complaints with other HEP activities.  Tolerated progression of dynamic SLS activities today well with addition of SLS on foam.  Pt. requesting continued MT to R medial/lateral calf as he feels improved comfort after STM/DTM and does present with palpable medial soleus/gastroc TPs which respond well to  MT.  Ended visit pain free thus modalities deferred.  Pt. progressing toward LTG #4 as he is demonstrating some improved R SLS stability today.    Personal Factors and Comorbidities  Age;Comorbidity 3+;Time since onset of injury/illness/exacerbation;Fitness;Past/Current Experience    Comorbidities  prostate CA, HLD, hay  fever, GERD, chronic back pain, asthma, R achilles tendon repair, B knee arthroscopy,R finger surgery, cardiac cath 2004, ACDF 2016    Rehab Potential  Good    PT Treatment/Interventions  ADLs/Self Care Home Management;Cryotherapy;Electrical Stimulation;Iontophoresis 74m/ml Dexamethasone;Moist Heat;Balance training;Therapeutic exercise;Therapeutic activities;Functional mobility training;Stair training;Gait training;Ultrasound;Neuromuscular re-education;Patient/family education;Manual techniques;Vasopneumatic Device;Taping;Energy conservation;Dry needling;Passive range of motion;Scar mobilization    PT Next Visit Plan  gentle ankle strengthening, stretching, modalities prn    Consulted and Agree with Plan of Care  Patient       Patient will benefit from skilled therapeutic intervention in order to improve the following deficits and impairments:  Increased edema, Decreased scar mobility, Decreased activity tolerance, Decreased strength, Pain, Increased fascial restricitons, Increased muscle spasms, Difficulty walking, Decreased balance, Decreased range of motion, Improper body mechanics, Postural dysfunction, Impaired flexibility  Visit Diagnosis: Pain in right ankle and joints of right foot  Stiffness of right ankle, not elsewhere classified  Muscle weakness (generalized)  Difficulty in walking, not elsewhere classified     Problem List Patient Active Problem List   Diagnosis Date Noted  . Infection, skin 07/26/2018  . Achilles tendon infection 07/26/2018  . Wound dehiscence, surgical 07/10/2018    Class: Acute  . Wound dehiscence, surgical, initial encounter 07/10/2018  . Spondylolisthesis of lumbar region 08/31/2016  . Neck pain 06/27/2014  . Prostate cancer ([facility]     MBess Harvest PTA 03/14/19 10:40 AM   [facility] [place] [address]Phone: [phone]  Fax:  [phone] Name: MARNETT DUDDYMRN: [id number] of Birth: 925-Aug-1952

## 2019-03-16 ENCOUNTER — Ambulatory Visit: Payer: Medicare Other | Admitting: Physical Therapy

## 2019-03-16 ENCOUNTER — Other Ambulatory Visit: Payer: Self-pay

## 2019-03-16 ENCOUNTER — Encounter: Payer: Self-pay | Admitting: Physical Therapy

## 2019-03-16 DIAGNOSIS — M25671 Stiffness of right ankle, not elsewhere classified: Secondary | ICD-10-CM

## 2019-03-16 DIAGNOSIS — M6281 Muscle weakness (generalized): Secondary | ICD-10-CM | POA: Diagnosis not present

## 2019-03-16 DIAGNOSIS — R262 Difficulty in walking, not elsewhere classified: Secondary | ICD-10-CM | POA: Diagnosis not present

## 2019-03-16 DIAGNOSIS — M25571 Pain in right ankle and joints of right foot: Secondary | ICD-10-CM

## 2019-03-16 NOTE — Therapy (Signed)
[facility] [address]  [place], [place], [address] Phone: [phone]   Fax:  [phone]  Physical Therapy Treatment  Patient Details  Name: Terry [name] MRN: [id number] Date of Birth: 02/27/1951 Referring Provider (PT): Mechele Claude, [place]   Encounter Date: 03/16/2019  PT End of Session - 03/16/19 1042    Visit Number  16    Number of Visits  28    Date for PT Re-Evaluation  05/04/19    Authorization Type  Medicare & BCBS    PT Start Time  0805    PT Stop Time  0845    PT Time Calculation (min)  40 min    Activity Tolerance  Patient tolerated treatment well;Patient limited by pain    Behavior During Therapy  [facility] for tasks assessed/performed       Past Medical History:  Diagnosis Date  . Achilles tendon infection 07/26/2018  . Allergy    takes Zyrtec as needed  . Arthritis   . Asthma    pt denies  . Chronic back pain   . GERD (gastroesophageal reflux disease)    takes Omeprazole daily  . Hay fever   . History of colon polyps    benign  . Hypercholesterolemia    takes Crestor daily  . Infection, skin 07/26/2018  . Pneumonia    "walking" pneumonia - as a child  . Prostate cancer ([place]) 04/13/13   gleason 6, vol 55.4 cc    Past Surgical History:  Procedure Laterality Date  . ANTERIOR CERVICAL DECOMP/DISCECTOMY FUSION N/A 06/27/2014   Procedure: ANTERIOR CERVICAL DECOMPRESSION/DISCECTOMY FUSION C5-C7   (2 LEVELS);  Surgeon: Melina Schools, MD;  Location: [place];  Service: Orthopedics;  Laterality: N/A;  . CARDIAC CATHETERIZATION  2004  . COLONOSCOPY    . EYE SURGERY     lazy eye as child, lasik surgery  . FINGER SURGERY Right    4th finger  . I&D EXTREMITY Right 07/10/2018   Procedure: IRRIGATION AND DEBRIDEMENT RIGHT ANKLE WOUND AND WOUND VAC PLACEMENT;  Surgeon: Wylene Simmer, MD;  Location: [facility];  Service: Orthopedics;  Laterality: Right;  . KNEE ARTHROSCOPY Bilateral   . PROSTATE BIOPSY   04/13/13   gleason 3+3=6, vol 55.4 cc  . ROBOT ASSISTED LAPAROSCOPIC RADICAL PROSTATECTOMY N/A 05/22/2013   Procedure: ROBOTIC ASSISTED LAPAROSCOPIC RADICAL PROSTATECTOMY LEVEL 1;  Surgeon: Dutch Gray, MD;  Location: [facility];  Service: Urology;  Laterality: N/A;  . TONSILLECTOMY    . VASECTOMY      There were no vitals filed for this visit.  Subjective Assessment - 03/16/19 0807    Subjective  Having to cancel his remaining appointments d/t having to quarantine himself for surgery. Still having intermittent pain in ankle and toes bvut feels like he is still improving. Reports 70% improvement in R achilles since initial eval.    Pertinent History  prostate CA, HLD, hay fever, GERD, chronic back pain, asthma, R achilles tendon repair, B knee arthroscopy,R finger surgery, cardiac cath 2004, ACDF 2016    Diagnostic tests  none    Patient Stated Goals  i want to be able to walk 3 miles    Currently in Pain?  Yes    Pain Score  2     Pain Location  Ankle    Pain Orientation  Right    Pain Descriptors / Indicators  Sharp    Pain Type  Chronic pain  [facility] PT Assessment - 03/16/19 0001      Assessment   Medical Diagnosis  Rupture of R achilles tendon    Referring Provider (PT)  Mechele Claude, PA-C    Onset Date/Surgical Date  06/07/18      AROM   Right Ankle Dorsiflexion  14    Right Ankle Plantar Flexion  42   cramping in ball of foot   Right Ankle Inversion  20    Right Ankle Eversion  15      Strength   Right Hip Flexion  4/5    Right Hip ABduction  4+/5    Right Hip ADduction  4/5    Left Hip Flexion  4+/5    Left Hip ABduction  4+/5    Left Hip ADduction  4/5    Right Knee Flexion  4+/5    Right Knee Extension  4+/5    Left Knee Flexion  4+/5    Left Knee Extension  4+/5    Right Ankle Dorsiflexion  4/5    Right Ankle Plantar Flexion  2+/5   nearly able to perfor 1 rep   Right Ankle Inversion  4/5    Right Ankle Eversion  4/5    Left Ankle Dorsiflexion  5/5     Left Ankle Plantar Flexion  2+/5    Left Ankle Inversion  4+/5    Left Ankle Eversion  4+/5                   [facility] Adult PT Treatment/Exercise - 03/16/19 0001      Manual Therapy   Joint Mobilization  R ankle anterior and posterior talar jt mobs grade III for improved plantarflexion and dorsiflexion     Soft tissue mobilization  STM to R medial gastroc/soleus complex and posterior tibialis- several tender trigger points throughout;    Myofascial Release  Manual TPR to medial gastroc/soleus and posterior tib    Passive ROM  R ankle dorsiflexion, plantarflexion, inversion, eversion to tolerance 3x20" each      Ankle Exercises: Aerobic   Nustep  L4 x 7 min (LEs only)      Ankle Exercises: Stretches   Soleus Stretch  1 rep;30 seconds    Gastroc Stretch  1 rep;30 seconds             PT Education - 03/16/19 1041    Education Details  discussion on objective progress with PT and remaining limitations    Person(s) Educated  Patient    Methods  Explanation;Demonstration;Tactile cues;Verbal cues    Comprehension  Verbalized understanding       PT Short Term Goals - 03/16/19 1308      PT SHORT TERM GOAL #1   Title  Patient to be independent with initial HEP.    Time  3    Period  Weeks    Status  Achieved    Target Date  02/08/19        PT Long Term Goals - 03/16/19 0812      PT LONG TERM GOAL #1   Title  Patient to be independent with advanced HEP.    Time  7    Period  Weeks    Status  Partially Met   met for current   Target Date  05/04/19      PT LONG TERM GOAL #2   Title  Patient to demonstrate R ankle AROM WFL and without pain limiting.    Time  7  Period  Weeks    Status  Partially Met   improved R ankle plantarflexion   Target Date  05/04/19      PT LONG TERM GOAL #3   Title  Patient to demonstrate B LE strength >=4+/5.    Time  7    Period  Weeks    Status  Partially Met   still limited   Target Date  05/04/19      PT LONG TERM  GOAL #4   Title  Patient to demonstrate R SLS for 30 sec without LOB.    Time  7    Period  Weeks    Status  On-going   able to perform 5 sec without LOB on R LE   Target Date  05/04/19      PT LONG TERM GOAL #5   Title  Patient to report return to golf with modifications as needed and no pain.    Time  7    Period  Weeks    Status  Unable to assess   reports that LBP is moreso limiting him in participating in golf that foot   Target Date  05/04/19            Plan - 03/16/19 1049    Clinical Impression Statement  Patient reporting 70% improvement in R Achilles since initial eval. Patient is undergoing surgery for removal of kidney stone, thus is cancelling remaining appointments, but agreeable to return to PT after his recovery. Patient has shown improvement in R ankle plantarflexion AROM, with inversion and eversion still limited. Strength testing objectively unchanged, however, patient today nearly able to perform single leg heel raise on R LE, whereas he was previously far from this at initial eval. Able to perform SLS on R LE for 5 sec before LOB, demonstrating room for improvement. Unable to return to golf at this time d/t his LBP, reporting that he feels this is limiting him more at this time rather than ankle pain. Patient tolerated manual therapy for improvement in ROM and soft tissue restriciton0- still most trigger points and tenderness evident over medial gastroc/soleus and posterior tibialis. Patient is showing slow progress, with other health issues potentially slowing progress thus far. Would benefit from additional skilled PT services 2x/week for 6 weeks to address remaining goals.    Personal Factors and Comorbidities  Age;Comorbidity 3+;Time since onset of injury/illness/exacerbation;Fitness;Past/Current Experience    Comorbidities  prostate CA, HLD, hay fever, GERD, chronic back pain, asthma, R achilles tendon repair, B knee arthroscopy,R finger surgery, cardiac cath 2004,  ACDF 2016    Rehab Potential  Good    PT Treatment/Interventions  ADLs/Self Care Home Management;Cryotherapy;Electrical Stimulation;Iontophoresis '4mg'$ /ml Dexamethasone;Moist Heat;Balance training;Therapeutic exercise;Therapeutic activities;Functional mobility training;Stair training;Gait training;Ultrasound;Neuromuscular re-education;Patient/family education;Manual techniques;Vasopneumatic Device;Taping;Energy conservation;Dry needling;Passive range of motion;Scar mobilization    PT Next Visit Plan  gentle ankle strengthening, stretching, modalities prn    Consulted and Agree with Plan of Care  Patient       Patient will benefit from skilled therapeutic intervention in order to improve the following deficits and impairments:  Increased edema, Decreased scar mobility, Decreased activity tolerance, Decreased strength, Pain, Increased fascial restricitons, Increased muscle spasms, Difficulty walking, Decreased balance, Decreased range of motion, Improper body mechanics, Postural dysfunction, Impaired flexibility  Visit Diagnosis: Pain in right ankle and joints of right foot  Stiffness of right ankle, not elsewhere classified  Muscle weakness (generalized)  Difficulty in walking, not elsewhere classified     Problem List Patient Active Problem  List   Diagnosis Date Noted  . Infection, skin 07/26/2018  . Achilles tendon infection 07/26/2018  . Wound dehiscence, surgical 07/10/2018    Class: Acute  . Wound dehiscence, surgical, initial encounter 07/10/2018  . Spondylolisthesis of lumbar region 08/31/2016  . Neck pain 06/27/2014  . Prostate cancer [facility])      Janene Harvey, PT, DPT 03/16/19 10:51 AM   [facility] [address]  [place], [place], [address] Phone: [phone]   Fax:  [phone]  Name: Terry [name] MRN: [id number] Date of Birth: 29-Jan-1951

## 2019-03-21 ENCOUNTER — Encounter: Payer: Medicare Other | Admitting: Physical Therapy

## 2019-03-23 ENCOUNTER — Other Ambulatory Visit (HOSPITAL_COMMUNITY)
Admission: RE | Admit: 2019-03-23 | Discharge: 2019-03-23 | Disposition: A | Discharge status: Home / Self Care | Payer: Medicare Other | Source: Ambulatory Visit | Attending: Urology | Admitting: Urology

## 2019-03-23 DIAGNOSIS — Z20828 Contact with and (suspected) exposure to other viral communicable diseases: Secondary | ICD-10-CM | POA: Insufficient documentation

## 2019-03-23 DIAGNOSIS — Z01812 Encounter for preprocedural laboratory examination: Secondary | ICD-10-CM | POA: Insufficient documentation

## 2019-03-24 ENCOUNTER — Encounter (HOSPITAL_BASED_OUTPATIENT_CLINIC_OR_DEPARTMENT_OTHER): Payer: Self-pay | Admitting: *Deleted

## 2019-03-24 ENCOUNTER — Other Ambulatory Visit: Payer: Self-pay

## 2019-03-24 LAB — NOVEL CORONAVIRUS, NAA (HOSP ORDER, SEND-OUT TO REF LAB; TAT 18-24 HRS): SARS-CoV-2, NAA: NOT DETECTED

## 2019-03-24 NOTE — H&P (Signed)
Office Visit Report     03/02/2019   --------------------------------------------------------------------------------   Terry [name]  [id number]  DOB: June 17, 1950, 68 year old Male  SSN: [id number]   PRIMARY CARE:  Precious Reel, MD  REFERRING:  Precious Reel, MD  PROVIDER:  Raynelle Bring, M.D.  TREATING:  Jiles Crocker, NP  LOCATION:  [facility] [phone]     --------------------------------------------------------------------------------   CC: I had prostate cancer treated.  HPI: Terry [name] is a 68 year-old male established patient who is here for prostate cancer which has been treated.  Prostate cancer: He is s/p a BNS RAL radical prostatectomy on 05/22/13. His PSA has been undetectable since surgery.   Diagnosis: pT2a Nx Mx, Gleason 3+3=6 adenocarcinoma with negative surgical margins  Pretreatment PSA: 5.1  Pretreatment SHIM: 18 August 2017: Not seen since 2017. At time of last office visit, he was complaining of pelvic and urethral pain after intercourse but persisted for a week or 2 before resolving. Not associated with erectile dysfunction or painful ejaculation. Since then, he states symptoms have become more persistent and constant, not associated with intercourse. He states having painful urination as well as intermittent daytime increases in frequency and urgency. Nocturia stable 1 and he has rare incontinence. Denies problems beginning urine stream, denies intermittency or spraying. Since last visit he's had lumbar vertebrae fusion as well as repair of the left Achilles tendon. Denies any unexplained weight loss or changes in fatigue/energy levels. Patient currently taking a course of Levaquin for an upper respiratory tract infection. Antibiotic uses and had no effect on his lower symptoms. He does state pain specifically resides at the base of the penis on the right side. He states pain is constant and not alleviated or modified by any factor.  Not associated with gross hematuria.   03/02/2019: He was referred to pelvic floor PT at last OV. He has not been seen by a urologist since then.   His PSA at his time of diagnosis was 5.1.   He did have surgery. He had the following treatment for prostate cancer: robotic prostatectomy. His prostate surgery was done 05/22/2013. He received radiation therapy for his cancer. He has not undergone Hormonal Therapy for treatment.   His PSA results have been low since his prostate cancer treatment.   He does not have difficulty with erections. He does not have urinary incontinence.   He is not having new bone pain. He has not seen blood in his stool since the biopsy. He has not recently had unwanted weight loss. This condition would be considered of mild to moderate severity with no modifying factors or associated signs or symptoms other than as noted above.     CC/HPI: 03/02/2019: F/u today as referral from his PCP. Patient denies past history of kidney stone disease but partially one month ago he passed several small stone fragments and brought them into his PCP for review. Symptoms were associated with increased urinary frequency/urgency, burning and painful urination, intermittency of urine stream, and nocturia 4-5 times nightly. He has not passed any additional stone fragments since then. He denies any visible blood in the urine or recent stone material passage since PCP visit but he continues to have bothersome/painful LUTS. UA is clear today.   Most recent creatinine 1.0. PSA assessed earlier this month was 0.0003.   PCP order CT stone protocol imaging study which noted a 3 mm left lower pole calculus as well as a  centimeter-sized stone noted in the right bladder base. No evidence of hydronephrosis or other GU and abnormality on exam.   Patient has chronic lower back pain which is followed by his PCP. He goes to physical therapy quite often for management of this.     ALLERGIES:  Penicillins    MEDICATIONS: Aleve  Centrum Silver Adult 50+ Oral Tablet Oral  Crestor 20 MG Oral Tablet Oral  Gabapentin  PriLOSEC OTC 20 MG Oral Tablet Delayed Release Oral  Restasis 0.05 % Ophthalmic Emulsion Ophthalmic  ZyrTEC Allergy TABS Oral     GU PSH: Robotic Radical Prostatectomy - 2014       PSH Notes: Prostatect Retropubic Radical W/ Nerve Sparing Laparoscopic, Arthroscopy Knee, Eye Surgery, Hand Incision Tendon Sheath Of A Finger, achilles tendon right leg-1\2020   NON-GU PSH: Incise Finger Tendon Sheath - 2014 Knee Arthroscopy; Dx - 2014     GU PMH: Dysuria - 2019 Urge incontinence - 2019 History of prostate cancer - 2019, - 2017 Pelvic/perineal pain - 2019 ED due to arterial insufficiency, Erectile dysfunction due to arterial insufficiency - 2017      PMH Notes: 1) Prostate cancer: He is s/p a BNS RAL radical prostatectomy on 05/22/13. His PSA has been undetectable since surgery.   Diagnosis: pT2a Nx Mx, Gleason 3+3=6 adenocarcinoma with negative surgical margins  Pretreatment PSA: 5.1  Pretreatment SHIM: 20     NON-GU PMH: Muscle weakness (generalized) - 12/27/2017, - 12/13/2017, - 2019, - 2019 Other muscle spasm - 12/27/2017, - 12/13/2017, - 2019, - 2019 Asthma, Asthma - 2014 Personal history of other diseases of the digestive system, History of esophageal reflux - 2014    FAMILY HISTORY: Bacterial Pneumonia - Mother Heart Disease - Father   SOCIAL HISTORY: Marital Status: Married Preferred Language: English; Ethnicity: Not Hispanic Or Latino; Race: White Current Smoking Status: Patient has never smoked.  Social Drinker.  Drinks 2 caffeinated drinks per day.     Notes: Activities of daily living (ADL's), independent, Exercise habits, Caffeine Use, Alcohol Use, Never A Smoker, Occupation:, Marital History - Currently Married   REVIEW OF SYSTEMS:    GU Review Male:   Patient reports frequent urination, hard to postpone urination, burning/ pain with  urination, get up at night to urinate, and trouble starting your stream. Patient denies leakage of urine, stream starts and stops, have to strain to urinate , erection problems, and penile pain.  Gastrointestinal (Upper):   Patient denies nausea, vomiting, and indigestion/ heartburn.  Gastrointestinal (Lower):   Patient denies diarrhea and constipation.  Constitutional:   Patient denies fever, night sweats, weight loss, and fatigue.  Skin:   Patient denies skin rash/ lesion and itching.  Eyes:   Patient reports blurred vision.   Ears/ Nose/ Throat:   Patient denies sore throat and sinus problems.  Hematologic/Lymphatic:   Patient denies swollen glands and easy bruising.  Cardiovascular:   Patient denies leg swelling and chest pains.  Respiratory:   Patient denies cough and shortness of breath.  Endocrine:   Patient denies excessive thirst.  Musculoskeletal:   Patient reports back pain. Patient denies joint pain.  Neurological:   Patient reports headaches. Patient denies dizziness.  Psychologic:   Patient denies depression and anxiety.   VITAL SIGNS:      03/02/2019 02:50 PM  Weight 178 lb / 80.74 kg  Height 67.5 in / 171.45 cm  BP 136/88 mmHg  Pulse 94 /min  Temperature 98.7 F / 37.0 C  BMI 27.5 kg/m  GU PHYSICAL EXAMINATION:    Scrotum: No lesions. No edema. No cysts. No warts.  Epididymides: Left: left body contains a cyst. Right: No spermatocele, no masses, no cysts, no tenderness, no induration, no enlargement. Left: No spermatocele, no masses, no tenderness, no induration, no enlargement.   Testes: No tenderness, no swelling, no enlargement left testes. No tenderness, no swelling, no enlargement right testes. Normal location left testes. Normal location right testes. No mass, no cyst, no varicocele, no hydrocele left testes. No mass, no cyst, no varicocele, no hydrocele right testes.  Urethral Meatus: Normal size. No lesion, no wart, no discharge, no polyp. Normal location.   Penis: Circumcised, no warts, no cracks. No dorsal Peyronie's plaques, no left corporal Peyronie's plaques, no right corporal Peyronie's plaques, no scarring, no warts. No balanitis, no meatal stenosis.  Prostate: Prostate surgically absent.  Seminal Vesicles: Prostate surgically absent.   MULTI-SYSTEM PHYSICAL EXAMINATION:    Constitutional: Well-nourished. No physical deformities. Normally developed. Good grooming.  Neck: Neck symmetrical, not swollen. Normal tracheal position.  Respiratory: No labored breathing, no use of accessory muscles.   Cardiovascular: Normal temperature, normal extremity pulses, no swelling, no varicosities.  Lymphatic: No enlargement of neck, axillae, groin.  Skin: No paleness, no jaundice, no cyanosis. No lesion, no ulcer, no rash.  Neurologic / Psychiatric: Oriented to time, oriented to place, oriented to person. No depression, no anxiety, no agitation.  Gastrointestinal: No mass, no tenderness, no rigidity, non obese abdomen. NO CVA, flank tenderness.  Musculoskeletal: Normal gait and station of head and neck.     PAST DATA REVIEWED:  Source Of History:  Patient, Medical Record Summary  Lab Test Review:   PSA  Records Review:   Previous Doctor Records, Previous Hospital Records, Previous Patient Records  Urine Test Review:   Urinalysis  X-Ray Review: KUB: Reviewed Films. Discussed With Patient.  C.T. Abdomen/Pelvis: Reviewed Films. Reviewed Report.     02/07/19 08/02/17 02/11/16 08/15/15 01/19/15 06/19/14 11/23/13 07/03/13  PSA  Total PSA 0.0003 ng/dl <0.015 ng/mL < 0.02 ng/dl <0.01  <0.01  <0.01  <0.01  0.01     PROCEDURES:         KUB - [id number]  A single view of the abdomen is obtained. No obvious right renal calculi identified. Stable-appearing opacity in the left lower pole correlates with previously identified nonobstructing renal calculi. The anatomical expected tract of both ureters appeared clear. Opacity identified overlying the right side of the  coccyx bone consistent with previously identified bladder calculus. Bowel gas pattern within normal limits. Stable-appearing pelvic phlebolith noted bilaterally. Stable-appearing spinal fixation hardware along the lumbar spine identified.      Patient confirmed No Neulasta OnPro Device.           Urinalysis Dipstick Dipstick Cont'd  Color: Yellow Bilirubin: Neg mg/dL  Appearance: Clear Ketones: Neg mg/dL  Specific Gravity: 1.025 Blood: Neg ery/uL  pH: 5.5 Protein: Neg mg/dL  Glucose: Neg mg/dL Urobilinogen: 0.2 mg/dL    Nitrites: Neg    Leukocyte Esterase: Neg leu/uL    ASSESSMENT:      ICD-10 Details  1 GU:   History of prostate cancer - Z85.46 Stable  2   Renal calculus - N20.0 Left, Stable  3   Bladder Stone - N21.0    PLAN:           Orders Labs Urine Culture  X-Rays: KUB          Schedule Return Visit/Planned Activity: Other See Visit Notes - Follow up  MD, Schedule Surgery          Document Letter(s):  Created for Patient: Clinical Summary         Notes:   Patient does have a small non-obstructed right renal calculus but I told him today based on my review of CT imaging and x-ray performed today that this is likely not the cause of his continued back pain and discomfort. He continues to see physical therapy in regards to this chronic symptom. Patient's increased lower urinary tract symptoms likely result of the known bladder calculi seen on recent CT imaging and confirmed today on KUB. I'm going to discuss with his urologist about plan of care moving forward which may include office cystoscopy and attempt to retrieve the calculus or proceeding with cystolitholapaxy under anesthesia.   Recent PSA reassuring with lab value near undetectable level. I'll confirm with his urologist about need for ongoing evaluation of such which would likely be in the form of continued annual PSA assessment.    * Signed by Jiles Crocker, NP on 03/02/19 at 4:20 PM (EDT)*        APPENDED NOTES:  I spoke with Terry [name] and reviewed his CT scan independently. After discussing his situation, he does wish to proceed with cystoscopy and removal of his bladder stone. We reviewed this procedure in detail including the potential risks, complications, and the expected recovery process. He gives informed consent to proceed.     * Signed by Raynelle Bring, M.D. on 03/07/19 at 1:18 PM (EDT)*

## 2019-03-24 NOTE — Progress Notes (Signed)
Spoke w/ via phone for pre-op interview--- PT Lab needs dos----  NONE COVID test ------ 03-23-2019 Arrive at ------- 1245 NPO after ------ MN w/ exception clear liquids until 0830 (no cream/ milk products) then nothing by mouth Medications to take morning of surgery ----- NONE Diabetic medication ----- n/a Patient Special Instructions ----- n/a Pre-Op special Istructions ----- n/a Patient verbalized understanding of instructions that were given at this phone interview. Patient denies shortness of breath, chest pain, fever, cough a this phone interview.

## 2019-03-27 ENCOUNTER — Encounter (HOSPITAL_BASED_OUTPATIENT_CLINIC_OR_DEPARTMENT_OTHER): Payer: Self-pay | Admitting: Emergency Medicine

## 2019-03-27 ENCOUNTER — Other Ambulatory Visit: Payer: Self-pay

## 2019-03-27 ENCOUNTER — Ambulatory Visit (HOSPITAL_BASED_OUTPATIENT_CLINIC_OR_DEPARTMENT_OTHER)
Admission: RE | Admit: 2019-03-27 | Discharge: 2019-03-27 | Disposition: A | Discharge status: Home / Self Care | Payer: Medicare Other | Attending: Urology | Admitting: Urology

## 2019-03-27 ENCOUNTER — Encounter (HOSPITAL_BASED_OUTPATIENT_CLINIC_OR_DEPARTMENT_OTHER)
Admission: RE | Disposition: A | Discharge status: Home / Self Care | Payer: Self-pay | Source: Home / Self Care | Attending: Urology

## 2019-03-27 ENCOUNTER — Ambulatory Visit (HOSPITAL_BASED_OUTPATIENT_CLINIC_OR_DEPARTMENT_OTHER): Payer: Medicare Other | Admitting: Anesthesiology

## 2019-03-27 DIAGNOSIS — X58XXXA Exposure to other specified factors, initial encounter: Secondary | ICD-10-CM | POA: Insufficient documentation

## 2019-03-27 DIAGNOSIS — Z981 Arthrodesis status: Secondary | ICD-10-CM | POA: Insufficient documentation

## 2019-03-27 DIAGNOSIS — Z9079 Acquired absence of other genital organ(s): Secondary | ICD-10-CM | POA: Diagnosis not present

## 2019-03-27 DIAGNOSIS — J45909 Unspecified asthma, uncomplicated: Secondary | ICD-10-CM | POA: Diagnosis not present

## 2019-03-27 DIAGNOSIS — Z880 Allergy status to penicillin: Secondary | ICD-10-CM | POA: Insufficient documentation

## 2019-03-27 DIAGNOSIS — Z79899 Other long term (current) drug therapy: Secondary | ICD-10-CM | POA: Diagnosis not present

## 2019-03-27 DIAGNOSIS — Z8546 Personal history of malignant neoplasm of prostate: Secondary | ICD-10-CM | POA: Diagnosis not present

## 2019-03-27 DIAGNOSIS — M1990 Unspecified osteoarthritis, unspecified site: Secondary | ICD-10-CM | POA: Diagnosis not present

## 2019-03-27 DIAGNOSIS — T191XXA Foreign body in bladder, initial encounter: Secondary | ICD-10-CM | POA: Diagnosis not present

## 2019-03-27 DIAGNOSIS — M549 Dorsalgia, unspecified: Secondary | ICD-10-CM | POA: Diagnosis not present

## 2019-03-27 DIAGNOSIS — N200 Calculus of kidney: Secondary | ICD-10-CM | POA: Diagnosis not present

## 2019-03-27 DIAGNOSIS — Z923 Personal history of irradiation: Secondary | ICD-10-CM | POA: Insufficient documentation

## 2019-03-27 DIAGNOSIS — G8929 Other chronic pain: Secondary | ICD-10-CM | POA: Diagnosis not present

## 2019-03-27 DIAGNOSIS — K219 Gastro-esophageal reflux disease without esophagitis: Secondary | ICD-10-CM | POA: Diagnosis not present

## 2019-03-27 DIAGNOSIS — N210 Calculus in bladder: Secondary | ICD-10-CM | POA: Diagnosis not present

## 2019-03-27 DIAGNOSIS — E785 Hyperlipidemia, unspecified: Secondary | ICD-10-CM | POA: Diagnosis not present

## 2019-03-27 DIAGNOSIS — Z8249 Family history of ischemic heart disease and other diseases of the circulatory system: Secondary | ICD-10-CM | POA: Diagnosis not present

## 2019-03-27 HISTORY — DX: Calculus in bladder: N21.0

## 2019-03-27 HISTORY — DX: Other complications of anesthesia, initial encounter: T88.59XA

## 2019-03-27 HISTORY — DX: Presence of spectacles and contact lenses: Z97.3

## 2019-03-27 HISTORY — PX: CYSTOSCOPY WITH LITHOLAPAXY: SHX1425

## 2019-03-27 HISTORY — DX: Hyperlipidemia, unspecified: E78.5

## 2019-03-27 HISTORY — DX: Personal history of malignant neoplasm of prostate: Z85.46

## 2019-03-27 SURGERY — CYSTOSCOPY, WITH BLADDER CALCULUS LITHOLAPAXY
Anesthesia: General | Site: Renal

## 2019-03-27 MED ORDER — ONDANSETRON HCL 4 MG/2ML IJ SOLN
INTRAMUSCULAR | Status: DC | PRN
  Administered 2019-03-27: 4 mg via INTRAVENOUS

## 2019-03-27 MED ORDER — ONDANSETRON HCL 4 MG/2ML IJ SOLN
INTRAMUSCULAR | Status: AC
  Filled 2019-03-27: qty 2

## 2019-03-27 MED ORDER — SODIUM CHLORIDE 0.9 % IR SOLN
Status: DC | PRN
  Administered 2019-03-27: 3000 mL

## 2019-03-27 MED ORDER — PHENAZOPYRIDINE HCL 100 MG PO TABS
ORAL_TABLET | ORAL | Status: AC
  Filled 2019-03-27: qty 2

## 2019-03-27 MED ORDER — FENTANYL CITRATE (PF) 100 MCG/2ML IJ SOLN
INTRAMUSCULAR | Status: AC
  Filled 2019-03-27: qty 2

## 2019-03-27 MED ORDER — LIDOCAINE 2% (20 MG/ML) 5 ML SYRINGE
INTRAMUSCULAR | Status: AC
  Filled 2019-03-27: qty 5

## 2019-03-27 MED ORDER — PROPOFOL 10 MG/ML IV BOLUS
INTRAVENOUS | Status: AC
  Filled 2019-03-27: qty 20

## 2019-03-27 MED ORDER — PHENAZOPYRIDINE HCL 200 MG PO TABS
200.0000 mg | ORAL_TABLET | Freq: Once | ORAL | Status: AC
  Administered 2019-03-27: 200 mg via ORAL
  Filled 2019-03-27: qty 1

## 2019-03-27 MED ORDER — PHENAZOPYRIDINE HCL 200 MG PO TABS: 200.0000 mg | ORAL_TABLET | Freq: Three times a day (TID) | ORAL | 0 refills | Status: DC | PRN

## 2019-03-27 MED ORDER — DEXAMETHASONE SODIUM PHOSPHATE 10 MG/ML IJ SOLN
INTRAMUSCULAR | Status: AC
  Filled 2019-03-27: qty 1

## 2019-03-27 MED ORDER — CIPROFLOXACIN IN D5W 400 MG/200ML IV SOLN
INTRAVENOUS | Status: AC
  Filled 2019-03-27: qty 200

## 2019-03-27 MED ORDER — CIPROFLOXACIN IN D5W 400 MG/200ML IV SOLN
400.0000 mg | Freq: Once | INTRAVENOUS | Status: AC
  Administered 2019-03-27: 400 mg via INTRAVENOUS
  Filled 2019-03-27: qty 200

## 2019-03-27 MED ORDER — FENTANYL CITRATE (PF) 100 MCG/2ML IJ SOLN
INTRAMUSCULAR | Status: DC | PRN
  Administered 2019-03-27: 100 ug via INTRAVENOUS

## 2019-03-27 MED ORDER — FENTANYL CITRATE (PF) 100 MCG/2ML IJ SOLN
25.0000 ug | INTRAMUSCULAR | Status: DC | PRN
  Filled 2019-03-27: qty 1

## 2019-03-27 MED ORDER — LACTATED RINGERS IV SOLN
INTRAVENOUS | Status: DC
  Administered 2019-03-27: 13:00:00 via INTRAVENOUS
  Filled 2019-03-27: qty 1000

## 2019-03-27 MED ORDER — LIDOCAINE 2% (20 MG/ML) 5 ML SYRINGE
INTRAMUSCULAR | Status: DC | PRN
  Administered 2019-03-27: 100 mg via INTRAVENOUS

## 2019-03-27 MED ORDER — PROPOFOL 10 MG/ML IV BOLUS
INTRAVENOUS | Status: DC | PRN
  Administered 2019-03-27: 180 mg via INTRAVENOUS

## 2019-03-27 MED ORDER — DEXAMETHASONE SODIUM PHOSPHATE 10 MG/ML IJ SOLN
INTRAMUSCULAR | Status: DC | PRN
  Administered 2019-03-27: 10 mg via INTRAVENOUS

## 2019-03-27 MED ORDER — PROMETHAZINE HCL 25 MG/ML IJ SOLN
6.2500 mg | INTRAMUSCULAR | Status: DC | PRN
  Filled 2019-03-27: qty 1

## 2019-03-27 SURGICAL SUPPLY — 15 items
BAG DRAIN URO-CYSTO SKYTR STRL (DRAIN) ×3 IMPLANT
CLOTH BEACON ORANGE TIMEOUT ST (SAFETY) ×6 IMPLANT
GLOVE BIO SURGEON STRL SZ7.5 (GLOVE) ×3 IMPLANT
GOWN STRL REUS W/ TWL LRG LVL3 (GOWN DISPOSABLE) ×1 IMPLANT
GOWN STRL REUS W/ TWL XL LVL3 (GOWN DISPOSABLE) ×1 IMPLANT
GOWN STRL REUS W/TWL LRG LVL3 (GOWN DISPOSABLE) ×2
GOWN STRL REUS W/TWL XL LVL3 (GOWN DISPOSABLE) ×2
KIT TURNOVER CYSTO (KITS) ×3 IMPLANT
MANIFOLD NEPTUNE II (INSTRUMENTS) IMPLANT
NEEDLE HYPO 22GX1.5 SAFETY (NEEDLE) IMPLANT
NS IRRIG 500ML POUR BTL (IV SOLUTION) IMPLANT
PACK CYSTO (CUSTOM PROCEDURE TRAY) ×3 IMPLANT
TUBE CONNECTING 12'X1/4 (SUCTIONS)
TUBE CONNECTING 12X1/4 (SUCTIONS) IMPLANT
WATER STERILE IRR 3000ML UROMA (IV SOLUTION) ×3 IMPLANT

## 2019-03-27 NOTE — Transfer of Care (Signed)
Immediate Anesthesia Transfer of Care Note  Patient: Terry [name]  Procedure(s) Performed: CYSTOSCOPY WITH REMOVAL OF BLADDER STONE AND FOREIGN BODY (N/A Renal)  Patient Location: PACU  Anesthesia Type:General  Level of Consciousness: awake, alert  and oriented  Airway & Oxygen Therapy: Patient Spontanous Breathing and Patient connected to nasal cannula oxygen  Post-op Assessment: Report given to RN and Post -op Vital signs reviewed and stable  Post vital signs: Reviewed and stable  Last Vitals:  Vitals Value Taken Time  BP 149/99 03/27/19 1354  Temp 36.8 C 03/27/19 1354  Pulse 63 03/27/19 1355  Resp 13 03/27/19 1355  SpO2 100 % 03/27/19 1355  Vitals shown include unvalidated device data.  Last Pain: There were no vitals filed for this visit.    Patients Stated Pain Goal: 3 ([id number])  Complications: No apparent anesthesia complications

## 2019-03-27 NOTE — Interval H&P Note (Signed)
History and Physical Interval Note:  03/27/2019 12:58 PM  Terry [name]  has presented today for surgery, with the diagnosis of BLADDER CALCULUS.  The various methods of treatment have been discussed with the patient and family. After consideration of risks, benefits and other options for treatment, the patient has consented to  Procedure(s): CYSTOSCOPY WITH LITHOLAPAXY (N/A) as a surgical intervention.  The patient's history has been reviewed, patient examined, no change in status, stable for surgery.  I have reviewed the patient's chart and labs.  Questions were answered to the patient's satisfaction.     [employer]

## 2019-03-27 NOTE — Op Note (Signed)
Preoperative diagnosis: Bladder calculus (2.5 cm)  Postoperative diagnosis: Bladder calculus, foreign body  Procedures: 1.  Cystoscopy 2.  Removal of bladder calculus and underlying foreign body  Surgeon: Pryor Curia MD  Anesthesia: General  EBL: Minimal  Complications: None  Specimens: None  Indication: Mr. Sender is a 68 year old gentleman status post robotic prostatectomy for treatment of prostate cancer in 2014.  He recently has had progressive symptoms of pelvic pain, pain with urination, urgency, and frequency.  He maintains good continence.  He recently had a CT scan that demonstrated a calculus located at the base of the bladder.  After discussing options, he elected to proceed with cystoscopy under anesthesia and treatment of his bladder stone.  The potential risks, complications, and the expected recovery process was discussed in detail.  Informed consent was obtained.  Description of procedure: The patient was taken to the operating room and a general anesthetic was administered.  He was given preoperative antibiotics, placed in the dorsolithotomy position, and prepped and draped in usual sterile fashion.  A preoperative timeout was performed.  Cystourethroscopy was performed with a 30 degree lens.  The anterior and posterior urethra were unremarkable.  His prostate was surgically absent.  Immediately on entering the bladder, there was noted to be a large stone just inside the bladder neck posteriorly.  The bladder was examined in its entirety and there were no bladder tumors or other mucosal pathology identified.  The ureteral orifice ease were identified and were in their normal anatomic location and effluxing clear urine.  Attention turned to the bladder calculus.  This was a very friable stone and using flexible graspers, it was able to be fragmented.  Underlying the stone, a hemolock clip was identified that had migrated through the bladder mucosa posteriorly.  This  was able to be grasped with the flexible graspers and removed cystoscopically.  This was well away from the ureteral orifices.  The fragments from the bladder stone were removed in their entirety.  Further inspection revealed minimal bleeding from the bladder mucosa.  The bladder was emptied and the procedure was ended.  He tolerated the procedure well and without complications.  He was able to be awakened and transferred to recovery unit in satisfactory condition.

## 2019-03-27 NOTE — Discharge Instructions (Signed)
1. You may see some blood in the urine and may have some burning with urination for 48-72 hours. You also may notice that you have to urinate more frequently or urgently after your procedure which is normal.  2. You should call should you develop an inability urinate, fever > 101, persistent nausea and vomiting that prevents you from eating or drinking to stay hydrated.   Post Anesthesia Home Care Instructions  Activity: Get plenty of rest for the remainder of the day. A responsible individual must stay with you for 24 hours following the procedure.  For the next 24 hours, DO NOT: -Drive a car -Paediatric nurse -Drink alcoholic beverages -Take any medication unless instructed by your physician -Make any legal decisions or sign important papers.  Meals: Start with liquid foods such as gelatin or soup. Progress to regular foods as tolerated. Avoid greasy, spicy, heavy foods. If nausea and/or vomiting occur, drink only clear liquids until the nausea and/or vomiting subsides. Call your physician if vomiting continues.  Special Instructions/Symptoms: Your throat may feel dry or sore from the anesthesia or the breathing tube placed in your throat during surgery. If this causes discomfort, gargle with warm salt water. The discomfort should disappear within 24 hours.  If you had a scopolamine patch placed behind your ear for the management of post- operative nausea and/or vomiting:  1. The medication in the patch is effective for 72 hours, after which it should be removed.  Wrap patch in a tissue and discard in the trash. Wash hands thoroughly with soap and water. 2. You may remove the patch earlier than 72 hours if you experience unpleasant side effects which may include dry mouth, dizziness or visual disturbances. 3. Avoid touching the patch. Wash your hands with soap and water after contact with the patch.

## 2019-03-27 NOTE — Anesthesia Preprocedure Evaluation (Signed)
Anesthesia Evaluation  Patient identified by MRN, date of birth, ID band Patient awake    Reviewed: Allergy & Precautions, NPO status , Patient's Chart, lab work & pertinent test results  Airway Mallampati: II  TM Distance: >3 FB Neck ROM: Full    Dental  (+) Dental Advisory Given   Pulmonary neg pulmonary ROS,    breath sounds clear to auscultation       Cardiovascular negative cardio ROS   Rhythm:Regular Rate:Normal     Neuro/Psych negative neurological ROS     GI/Hepatic Neg liver ROS, GERD  ,  Endo/Other  negative endocrine ROS  Renal/GU negative Renal ROS     Musculoskeletal  (+) Arthritis ,   Abdominal   Peds  Hematology negative hematology ROS (+)   Anesthesia Other Findings   Reproductive/Obstetrics                             Anesthesia Physical Anesthesia Plan  ASA: II  Anesthesia Plan: General   Post-op Pain Management:    Induction: Intravenous  PONV Risk Score and Plan: 2 and Dexamethasone, Ondansetron and Treatment may vary due to age or medical condition  Airway Management Planned: LMA  Additional Equipment:   Intra-op Plan:   Post-operative Plan: Extubation in OR  Informed Consent: I have reviewed the patients History and Physical, chart, labs and discussed the procedure including the risks, benefits and alternatives for the proposed anesthesia with the patient or authorized representative who has indicated his/her understanding and acceptance.     Dental advisory given  Plan Discussed with: CRNA  Anesthesia Plan Comments:         Anesthesia Quick Evaluation

## 2019-03-27 NOTE — Anesthesia Procedure Notes (Signed)
Procedure Name: Intubation Date/Time: 03/27/2019 1:29 PM Performed by: Kerry Chisolm D, CRNA Pre-anesthesia Checklist: Patient identified, Emergency Drugs available, Suction available and Patient being monitored Patient Re-evaluated:Patient Re-evaluated prior to induction Oxygen Delivery Method: Circle system utilized Preoxygenation: Pre-oxygenation with 100% oxygen Induction Type: IV induction Ventilation: Mask ventilation without difficulty LMA: LMA inserted LMA Size: 4.0 Tube type: Oral Number of attempts: 1 Placement Confirmation: positive ETCO2 and breath sounds checked- equal and bilateral Tube secured with: Tape Dental Injury: Teeth and Oropharynx as per pre-operative assessment

## 2019-03-28 ENCOUNTER — Encounter: Payer: Medicare Other | Admitting: Physical Therapy

## 2019-03-28 ENCOUNTER — Encounter (HOSPITAL_BASED_OUTPATIENT_CLINIC_OR_DEPARTMENT_OTHER): Payer: Self-pay | Admitting: Urology

## 2019-03-28 NOTE — Anesthesia Postprocedure Evaluation (Signed)
Anesthesia Post Note  Patient: TECUMSEH JAVADI  Procedure(s) Performed: CYSTOSCOPY WITH REMOVAL OF BLADDER STONE AND FOREIGN BODY (N/A Renal)     Patient location during evaluation: PACU Anesthesia Type: General Level of consciousness: awake and alert Pain management: pain level controlled Vital Signs Assessment: post-procedure vital signs reviewed and stable Respiratory status: spontaneous breathing, nonlabored ventilation, respiratory function stable and patient connected to nasal cannula oxygen Cardiovascular status: blood pressure returned to baseline and stable Postop Assessment: no apparent nausea or vomiting Anesthetic complications: no    Last Vitals:  Vitals:   03/27/19 1430 03/27/19 1608  BP: 129/87 (!) 152/84  Pulse: 66 65  Resp: 15 14  Temp:  36.6 C  SpO2: 95% 98%    Last Pain:  Vitals:   03/27/19 1600  PainSc: 3                  Tiajuana Amass

## 2019-03-30 ENCOUNTER — Other Ambulatory Visit: Payer: Self-pay

## 2019-03-30 ENCOUNTER — Ambulatory Visit: Payer: Medicare Other | Admitting: Physical Therapy

## 2019-03-30 ENCOUNTER — Encounter: Payer: Self-pay | Admitting: Physical Therapy

## 2019-03-30 DIAGNOSIS — R262 Difficulty in walking, not elsewhere classified: Secondary | ICD-10-CM | POA: Diagnosis not present

## 2019-03-30 DIAGNOSIS — M25571 Pain in right ankle and joints of right foot: Secondary | ICD-10-CM | POA: Diagnosis not present

## 2019-03-30 DIAGNOSIS — M6281 Muscle weakness (generalized): Secondary | ICD-10-CM

## 2019-03-30 DIAGNOSIS — M25671 Stiffness of right ankle, not elsewhere classified: Secondary | ICD-10-CM | POA: Diagnosis not present

## 2019-03-30 NOTE — Patient Instructions (Signed)

## 2019-03-30 NOTE — Therapy (Signed)
[facility] [address]  [place], [place], [address] Phone: [phone]   Fax:  [phone]  Physical Therapy Treatment  Patient Details  Name: Terry [name] MRN: [id number] Date of Birth: [birth year] Referring Provider (PT): Mechele Claude, [place]   Encounter Date: 03/30/2019  PT End of Session - 03/30/19 0843    Visit Number  17    Number of Visits  28    Date for PT Re-Evaluation  05/04/19    Authorization Type  Medicare & BCBS    PT Start Time  0810   pt late   PT Stop Time  0845    PT Time Calculation (min)  35 min    Activity Tolerance  Patient tolerated treatment well    Behavior During Therapy  [facility] for tasks assessed/performed       Past Medical History:  Diagnosis Date  . Arthritis    neck, back, knees, right ankle  . Bladder calculus   . Chronic back pain   . Complication of anesthesia    dizziness and "crazy amount of bile"  . GERD (gastroesophageal reflux disease)   . History of colon polyps    benign  . History of prostate cancer urologist-- dr Alinda Money   dx 2014---  s/p  prostatectomy 05-22-2013 ,  Gleason 3+3  . Hypercholesterolemia    takes Crestor daily  . Hyperlipidemia   . Wears glasses     Past Surgical History:  Procedure Laterality Date  . ACHILLES TENDON SURGERY Right 06-07-2018   _0    reconstruction  . ANTERIOR CERVICAL DECOMP/DISCECTOMY FUSION N/A 06/27/2014   Procedure: ANTERIOR CERVICAL DECOMPRESSION/DISCECTOMY FUSION C5-C7   (2 LEVELS);  Surgeon: Melina Schools, MD;  Location: [facility];  Service: Orthopedics;  Laterality: N/A;  . CARDIAC CATHETERIZATION  2004  . COLONOSCOPY    . CYSTOSCOPY WITH LITHOLAPAXY N/A 03/27/2019   Procedure: CYSTOSCOPY WITH REMOVAL OF BLADDER STONE AND FOREIGN BODY;  Surgeon: Raynelle Bring, MD;  Location: [facility];  Service: Urology;  Laterality: N/A;  . EYE SURGERY     lazy eye as child, lasik surgery  . FINGER SURGERY Right    4th finger  . I&D EXTREMITY Right 07/10/2018   Procedure: IRRIGATION AND DEBRIDEMENT RIGHT ANKLE WOUND AND WOUND VAC PLACEMENT;  Surgeon: Wylene Simmer, MD;  Location: [facility];  Service: Orthopedics;  Laterality: Right;  . KNEE ARTHROSCOPY Bilateral right ?;  left 05-25-2007  _1   . POSTERIOR LAMINECTOMY / DECOMPRESSION LUMBAR SPINE  08-31-2016    dr elsner  _2   . PROSTATE BIOPSY  04/13/13   gleason 3+3=6, vol 55.4 cc  . ROBOT ASSISTED LAPAROSCOPIC RADICAL PROSTATECTOMY N/A 05/22/2013   Procedure: ROBOTIC ASSISTED LAPAROSCOPIC RADICAL PROSTATECTOMY LEVEL 1;  Surgeon: Dutch Gray, MD;  Location: [facility];  Service: Urology;  Laterality: N/A;  . TONSILLECTOMY      There were no vitals filed for this visit.  Subjective Assessment - 03/30/19 0811    Subjective  Requesting to "take it easy today" d/t having surgery on Monday. The R achilles has been feeling straine.    Pertinent History  prostate CA, HLD, hay fever, GERD, chronic back pain, asthma, R achilles tendon repair, B knee arthroscopy,R finger surgery, cardiac cath 2004, ACDF 2016    Diagnostic tests  none    Patient Stated Goals  i want to be able to walk 3 miles    Currently in Pain?  Yes    Pain Score  2     Pain Location  Heel    Pain Orientation  Right    Pain Descriptors / Indicators  --   strained   Pain Type  Chronic pain                       [facility] Adult PT Treatment/Exercise - 03/30/19 0001      Manual Therapy   Manual Therapy  Soft tissue mobilization;Myofascial release    Manual therapy comments  prone     Soft tissue mobilization  STM to R medial and lateral gastroc/soleus complex and peroneals- several tender trigger points throughout;    Myofascial Release  Manual TPR to medial and lateral gastroc/soleus    Passive ROM  R ankle dorsiflexion, plantarflexion to tolerance 3x20" each      Ankle Exercises: Aerobic   Recumbent Bike  L1, 6 min      Ankle Exercises: Stretches   Plantar Fascia Stretch  2  reps;30 seconds    Soleus Stretch  30 seconds;2 reps             PT Education - 03/30/19 0818    Education Details  edu on benefits of DN    Person(s) Educated  Patient    Methods  Explanation;Demonstration;Tactile cues;Verbal cues;Handout    Comprehension  Verbalized understanding;Returned demonstration       PT Short Term Goals - 03/16/19 4496      PT SHORT TERM GOAL #1   Title  Patient to be independent with initial HEP.    Time  3    Period  Weeks    Status  Achieved    Target Date  02/08/19        PT Long Term Goals - 03/16/19 0812      PT LONG TERM GOAL #1   Title  Patient to be independent with advanced HEP.    Time  7    Period  Weeks    Status  Partially Met   met for current   Target Date  05/04/19      PT LONG TERM GOAL #2   Title  Patient to demonstrate R ankle AROM WFL and without pain limiting.    Time  7    Period  Weeks    Status  Partially Met   improved R ankle plantarflexion   Target Date  05/04/19      PT LONG TERM GOAL #3   Title  Patient to demonstrate B LE strength >=4+/5.    Time  7    Period  Weeks    Status  Partially Met   still limited   Target Date  05/04/19      PT LONG TERM GOAL #4   Title  Patient to demonstrate R SLS for 30 sec without LOB.    Time  7    Period  Weeks    Status  On-going   able to perform 5 sec without LOB on R LE   Target Date  05/04/19      PT LONG TERM GOAL #5   Title  Patient to report return to golf with modifications as needed and no pain.    Time  7    Period  Weeks    Status  Unable to assess   reports that LBP is moreso limiting him in participating in golf that foot   Target Date  05/04/19  Plan - 03/30/19 0850    Clinical Impression Statement  Patient requesting to "take it easy" today d/t having cystoscopy with removal of bladder stone and foreign body on Monday. Otherwise noting that he is feeling okay. Patient tolerated manual therapy for improvement in soft  tissue restriction and ROM. Patient still demonstrating significant soft tissue restriction and painful trigger points in R medial and lateral gastroc/soleus and peroneals. Demonstrated good ankle PROM with stretching. Ended session with gentle calf stretching. Educated patient on DN for possible improvement in soft tissue restriction and pain. Patient reported understanding. Patient without complaints at end of session.    Comorbidities  prostate CA, HLD, hay fever, GERD, chronic back pain, asthma, R achilles tendon repair, B knee arthroscopy,R finger surgery, cardiac cath 2004, ACDF 2016    PT Treatment/Interventions  ADLs/Self Care Home Management;Cryotherapy;Electrical Stimulation;Iontophoresis 66m/ml Dexamethasone;Moist Heat;Balance training;Therapeutic exercise;Therapeutic activities;Functional mobility training;Stair training;Gait training;Ultrasound;Neuromuscular re-education;Patient/family education;Manual techniques;Vasopneumatic Device;Taping;Energy conservation;Dry needling;Passive range of motion;Scar mobilization    PT Next Visit Plan  possible DN to R medial and lateral gastroc/soleus complex; gentle ankle strengthening, stretching, modalities prn    Consulted and Agree with Plan of Care  Patient       Patient will benefit from skilled therapeutic intervention in order to improve the following deficits and impairments:  Increased edema, Decreased scar mobility, Decreased activity tolerance, Decreased strength, Pain, Increased fascial restricitons, Increased muscle spasms, Difficulty walking, Decreased balance, Decreased range of motion, Improper body mechanics, Postural dysfunction, Impaired flexibility  Visit Diagnosis: Pain in right ankle and joints of right foot  Stiffness of right ankle, not elsewhere classified  Muscle weakness (generalized)  Difficulty in walking, not elsewhere classified     Problem List Patient Active Problem List   Diagnosis Date Noted  . Infection,  skin 07/26/2018  . Achilles tendon infection 07/26/2018  . Wound dehiscence, surgical 07/10/2018    Class: Acute  . Wound dehiscence, surgical, initial encounter 07/10/2018  . Spondylolisthesis of lumbar region 08/31/2016  . Neck pain 06/27/2014  . Prostate cancer ([facility]      YJanene Harvey PT, DPT 03/30/19 8:53 AM   [facility] [place] [address]Phone: [phone]  Fax:  [phone] Name: Terry BUCHHEITMRN: [id number] of Birth: 904/09/1950

## 2019-04-04 ENCOUNTER — Encounter: Payer: Self-pay | Admitting: Physical Therapy

## 2019-04-04 ENCOUNTER — Other Ambulatory Visit: Payer: Self-pay

## 2019-04-04 ENCOUNTER — Ambulatory Visit: Payer: Medicare Other | Attending: Orthopedic Surgery | Admitting: Physical Therapy

## 2019-04-04 DIAGNOSIS — M6281 Muscle weakness (generalized): Secondary | ICD-10-CM | POA: Insufficient documentation

## 2019-04-04 DIAGNOSIS — M25671 Stiffness of right ankle, not elsewhere classified: Secondary | ICD-10-CM | POA: Diagnosis not present

## 2019-04-04 DIAGNOSIS — M25571 Pain in right ankle and joints of right foot: Secondary | ICD-10-CM | POA: Diagnosis not present

## 2019-04-04 DIAGNOSIS — R262 Difficulty in walking, not elsewhere classified: Secondary | ICD-10-CM | POA: Insufficient documentation

## 2019-04-04 NOTE — Therapy (Signed)
[facility] [address]  [place], [place], [address] Phone: [phone]   Fax:  [phone]  Physical Therapy Treatment  Patient Details  Name: Terry [name] MRN: [id number] Date of Birth: [birth year] Referring Provider (PT): Mechele Claude, [place]   Encounter Date: 04/04/2019  PT End of Session - 04/04/19 0806    Visit Number  18    Number of Visits  28    Date for PT Re-Evaluation  05/04/19    Authorization Type  Medicare & BCBS    PT Start Time  0806    PT Stop Time  0852    PT Time Calculation (min)  46 min    Activity Tolerance  Patient tolerated treatment well    Behavior During Therapy  [facility] for tasks assessed/performed       Past Medical History:  Diagnosis Date  . Arthritis    neck, back, knees, right ankle  . Bladder calculus   . Chronic back pain   . Complication of anesthesia    dizziness and "crazy amount of bile"  . GERD (gastroesophageal reflux disease)   . History of colon polyps    benign  . History of prostate cancer urologist-- dr Alinda Money   dx 2014---  s/p  prostatectomy 05-22-2013 ,  Gleason 3+3  . Hypercholesterolemia    takes Crestor daily  . Hyperlipidemia   . Wears glasses     Past Surgical History:  Procedure Laterality Date  . ACHILLES TENDON SURGERY Right 06-07-2018   '@[facility]'    reconstruction  . ANTERIOR CERVICAL DECOMP/DISCECTOMY FUSION N/A 06/27/2014   Procedure: ANTERIOR CERVICAL DECOMPRESSION/DISCECTOMY FUSION C5-C7   (2 LEVELS);  Surgeon: Melina Schools, MD;  Location: [facility];  Service: Orthopedics;  Laterality: N/A;  . CARDIAC CATHETERIZATION  2004  . COLONOSCOPY    . CYSTOSCOPY WITH LITHOLAPAXY N/A 03/27/2019   Procedure: CYSTOSCOPY WITH REMOVAL OF BLADDER STONE AND FOREIGN BODY;  Surgeon: Raynelle Bring, MD;  [facility];  Service: Urology;  Laterality: N/A;  . EYE SURGERY     lazy eye as child, lasik surgery  . FINGER SURGERY Right    4th finger   . I&D EXTREMITY Right 07/10/2018   Procedure: IRRIGATION AND DEBRIDEMENT RIGHT ANKLE WOUND AND WOUND VAC PLACEMENT;  Surgeon: Wylene Simmer, MD;  Location: [place];  Service: Orthopedics;  Laterality: Right;  . KNEE ARTHROSCOPY Bilateral right ?;  left 05-25-2007  '@[facility]'   . POSTERIOR LAMINECTOMY / DECOMPRESSION LUMBAR SPINE  08-31-2016    dr elsner  '@[facility]'   . PROSTATE BIOPSY  04/13/13   gleason 3+3=6, vol 55.4 cc  . ROBOT ASSISTED LAPAROSCOPIC RADICAL PROSTATECTOMY N/A 05/22/2013   Procedure: ROBOTIC ASSISTED LAPAROSCOPIC RADICAL PROSTATECTOMY LEVEL 1;  Surgeon: Dutch Gray, MD;  Location: [facility];  Service: Urology;  Laterality: N/A;  . TONSILLECTOMY      There were no vitals filed for this visit.  Subjective Assessment - 04/04/19 0809    Subjective  Pt reporting his back is "acting up" today - states he will be seeing his neurosurgeon on 04/14/19.    Pertinent History  prostate CA, HLD, hay fever, GERD, chronic back pain, asthma, R achilles tendon repair, B knee arthroscopy,R finger surgery, cardiac cath 2004, ACDF 2016    Diagnostic tests  none    Patient Stated Goals  i want to be able to walk 3 miles    Currently in Pain?  Yes    Pain Score  2  Pain Location  Heel    Pain Orientation  Right    Pain Descriptors / Indicators  --   "scraping"   Pain Type  Chronic pain    Pain Frequency  Intermittent                       [facility] Adult PT Treatment/Exercise - 04/04/19 0806      Manual Therapy   Manual Therapy  Soft tissue mobilization;Myofascial release    Manual therapy comments  prone & side lying    Soft tissue mobilization  STM to R medial and lateral gastroc/soleus complex, flexor digitorum longus and peroneals- several tender trigger points throughout;    Myofascial Release  Manual TPR to medial and lateral gastroc/soleus and peroneals      Ankle Exercises: Aerobic   Nustep  L4 x 6 min (LEs only)       Trigger Point Dry Needling - 04/04/19 0806    Consent  Given?  Yes    Education Handout Provided  Previously provided    Muscles Treated Lower Quadrant  Peroneals;Posterior tibialis;Gastrocnemius;Soleus;Flexor Digitorum Longus   R LE   Peroneals Response  Twitch response elicited;Palpable increased muscle length    Posterior tibialis Response  Twitch response elicited;Palpable increased muscle length    Gastrocnemius Response  Twitch response elicited;Palpable increased muscle length    Soleus Response  Twitch response elicited;Palpable increased muscle length    Flexor digitorum longus Response  Twitch response elicited;Palpable increased muscle length           PT Education - 04/04/19 0815    Education Details  Role of DN, expected response to treatment & recommended post-treatment activity    Person(s) Educated  Patient    Methods  Explanation;Handout    Comprehension  Verbalized understanding       PT Short Term Goals - 03/16/19 0812      PT SHORT TERM GOAL #1   Title  Patient to be independent with initial HEP.    Time  3    Period  Weeks    Status  Achieved    Target Date  02/08/19        PT Long Term Goals - 03/16/19 0812      PT LONG TERM GOAL #1   Title  Patient to be independent with advanced HEP.    Time  7    Period  Weeks    Status  Partially Met   met for current   Target Date  05/04/19      PT LONG TERM GOAL #2   Title  Patient to demonstrate R ankle AROM WFL and without pain limiting.    Time  7    Period  Weeks    Status  Partially Met   improved R ankle plantarflexion   Target Date  05/04/19      PT LONG TERM GOAL #3   Title  Patient to demonstrate B LE strength >=4+/5.    Time  7    Period  Weeks    Status  Partially Met   still limited   Target Date  05/04/19      PT LONG TERM GOAL #4   Title  Patient to demonstrate R SLS for 30 sec without LOB.    Time  7    Period  Weeks    Status  On-going   able to perform 5 sec without LOB on R LE   Target Date  05/04/19      PT LONG TERM GOAL  #5   Title  Patient to report return to golf with modifications as needed and no pain.    Time  7    Period  Weeks    Status  Unable to assess   reports that LBP is moreso limiting him in participating in golf that foot   Target Date  05/04/19            Plan - 04/04/19 0813    Clinical Impression Statement  Terry [name] presenting to PT for manual therapy with DN to R calf today based on primary PT recommendation. Multiple TPs and taut bands identified throughout the R calf musculature which were addressed with manual STM/MFR and DN with positive twitch responses elicited resulting in palpable reduction in muscle tension with greatest response in tibialis posterior. Some apparent scar tissue found in medial gastroc limiting ability to advance needles. Patient educated on expected response to treatment and recommended post-treatment activity level including continued performance of HEP. Will assess response to DN next visit and address any further problem areas as indicated.    Comorbidities  prostate CA, HLD, hay fever, GERD, chronic back pain, asthma, R achilles tendon repair, B knee arthroscopy,R finger surgery, cardiac cath 2004, ACDF 2016    PT Treatment/Interventions  ADLs/Self Care Home Management;Cryotherapy;Electrical Stimulation;Iontophoresis 61m/ml Dexamethasone;Moist Heat;Balance training;Therapeutic exercise;Therapeutic activities;Functional mobility training;Stair training;Gait training;Ultrasound;Neuromuscular re-education;Patient/family education;Manual techniques;Vasopneumatic Device;Taping;Energy conservation;Dry needling;Passive range of motion;Scar mobilization    PT Next Visit Plan  assess response to DN to R medial and lateral gastroc/soleus complex, peroneals & tibialis posterior/flexor digitorium longus; gentle ankle strengthening, stretching, modalities prn    Consulted and Agree with Plan of Care  Patient       Patient will benefit from skilled therapeutic intervention in  order to improve the following deficits and impairments:  Increased edema, Decreased scar mobility, Decreased activity tolerance, Decreased strength, Pain, Increased fascial restricitons, Increased muscle spasms, Difficulty walking, Decreased balance, Decreased range of motion, Improper body mechanics, Postural dysfunction, Impaired flexibility  Visit Diagnosis: Pain in right ankle and joints of right foot  Stiffness of right ankle, not elsewhere classified  Muscle weakness (generalized)  Difficulty in walking, not elsewhere classified     Problem List Patient Active Problem List   Diagnosis Date Noted  . Infection, skin 07/26/2018  . Achilles tendon infection 07/26/2018  . Wound dehiscence, surgical 07/10/2018    Class: Acute  . Wound dehiscence, surgical, initial encounter 07/10/2018  . Spondylolisthesis of lumbar region 08/31/2016  . Neck pain 06/27/2014  . Prostate cancer ([facility]     JPercival Spanish PT, MPT 04/04/2019, 9:52 AM  [facility] [place] [address]Phone: [phone]  Fax:  [phone] Name: Terry BIBBEEMRN: [id number] of Birth: 905/04/1951

## 2019-04-06 ENCOUNTER — Encounter: Payer: Self-pay | Admitting: Physical Therapy

## 2019-04-06 ENCOUNTER — Ambulatory Visit: Payer: Medicare Other | Admitting: Physical Therapy

## 2019-04-06 ENCOUNTER — Other Ambulatory Visit: Payer: Self-pay

## 2019-04-06 DIAGNOSIS — M6281 Muscle weakness (generalized): Secondary | ICD-10-CM

## 2019-04-06 DIAGNOSIS — M25671 Stiffness of right ankle, not elsewhere classified: Secondary | ICD-10-CM

## 2019-04-06 DIAGNOSIS — R262 Difficulty in walking, not elsewhere classified: Secondary | ICD-10-CM | POA: Diagnosis not present

## 2019-04-06 DIAGNOSIS — M25571 Pain in right ankle and joints of right foot: Secondary | ICD-10-CM

## 2019-04-06 NOTE — Therapy (Signed)
[facility] [address]  [place], [place], [address] Phone: [phone]   Fax:  [phone]  Physical Therapy Treatment  Patient Details  Name: Terry [name] MRN: [id number] Date of Birth: 1951/04/12 Referring Provider (PT): Mechele Claude, [place]   Encounter Date: 04/06/2019  PT End of Session - 04/06/19 0805    Visit Number  19    Number of Visits  28    Date for PT Re-Evaluation  05/04/19    Authorization Type  Medicare ([place]) & BCBS    PT Start Time  0805    PT Stop Time  0845    PT Time Calculation (min)  40 min    Activity Tolerance  Patient tolerated treatment well    Behavior During Therapy  [facility] for tasks assessed/performed       Past Medical History:  Diagnosis Date  . Arthritis    neck, back, knees, right ankle  . Bladder calculus   . Chronic back pain   . Complication of anesthesia    dizziness and "crazy amount of bile"  . GERD (gastroesophageal reflux disease)   . History of colon polyps    benign  . History of prostate cancer urologist-- dr Alinda Money   dx 2014---  s/p  prostatectomy 05-22-2013 ,  Gleason 3+3  . Hypercholesterolemia    takes Crestor daily  . Hyperlipidemia   . Wears glasses     Past Surgical History:  Procedure Laterality Date  . ACHILLES TENDON SURGERY Right 06-07-2018   '@[facility]'    reconstruction  . ANTERIOR CERVICAL DECOMP/DISCECTOMY FUSION N/A 06/27/2014   Procedure: ANTERIOR CERVICAL DECOMPRESSION/DISCECTOMY FUSION C5-C7   (2 LEVELS);  Surgeon: Melina Schools, MD;  Location: [place];  Service: Orthopedics;  Laterality: N/A;  . CARDIAC CATHETERIZATION  2004  . COLONOSCOPY    . CYSTOSCOPY WITH LITHOLAPAXY N/A 03/27/2019   Procedure: CYSTOSCOPY WITH REMOVAL OF BLADDER STONE AND FOREIGN BODY;  Surgeon: Raynelle Bring, MD;  Location: [facility];  Service: Urology;  Laterality: N/A;  . EYE SURGERY     lazy eye as child, lasik surgery  . FINGER SURGERY Right    4th  finger  . I&D EXTREMITY Right 07/10/2018   Procedure: IRRIGATION AND DEBRIDEMENT RIGHT ANKLE WOUND AND WOUND VAC PLACEMENT;  Surgeon: Wylene Simmer, MD;  Location: [place];  Service: Orthopedics;  Laterality: Right;  . KNEE ARTHROSCOPY Bilateral right ?;  left 05-25-2007  '@[facility]'   . POSTERIOR LAMINECTOMY / DECOMPRESSION LUMBAR SPINE  08-31-2016    dr elsner  '@[facility]'   . PROSTATE BIOPSY  04/13/13   gleason 3+3=6, vol 55.4 cc  . ROBOT ASSISTED LAPAROSCOPIC RADICAL PROSTATECTOMY N/A 05/22/2013   Procedure: ROBOTIC ASSISTED LAPAROSCOPIC RADICAL PROSTATECTOMY LEVEL 1;  Surgeon: Dutch Gray, MD;  [facility];  Service: Urology;  Laterality: N/A;  . TONSILLECTOMY      There were no vitals filed for this visit.  Subjective Assessment - 04/06/19 0808    Subjective  Pt reporting some increased soreness after the DN last session. Sill having the "usual" pain in his heel/ankle.    Pertinent History  prostate CA, HLD, hay fever, GERD, chronic back pain, asthma, R achilles tendon repair, B knee arthroscopy,R finger surgery, cardiac cath 2004, ACDF 2016    Diagnostic tests  none    Patient Stated Goals  i want to be able to walk 3 miles    Currently in Pain?  Yes    Pain Score  2    1.5 - 2/10   Pain Location  Heel    Pain Orientation  Right    Pain Descriptors / Indicators  Dull    Pain Type  Chronic pain                       [facility] Adult PT Treatment/Exercise - 04/06/19 0805      Knee/Hip Exercises: Aerobic   Recumbent Bike  --      Manual Therapy   Manual Therapy  Soft tissue mobilization;Myofascial release;Passive ROM;Joint mobilization;Taping    Manual therapy comments  prone     Joint Mobilization  R 4th & 5th tarsal-metatarsal    Soft tissue mobilization  STM to R medial and lateral gastroc/soleus complex, flexor digitorum longus and peroneals    Myofascial Release  Manual TPR to medial and lateral gastroc/soleus and peroneals    Passive ROM  R ankle DF/PF with INV/EV - good  motion available    Kinesiotex  Inhibit Muscle      Kinesiotix   Inhibit Muscle   R gastroc/soleus - 3 strips - 30-50% from inferior heel along posterior heelcord to upper calf, medial & lateral inferior heel crossing over heelcord to opp lateral & medial calf   sensitive skin tape     Ankle Exercises: Aerobic   Recumbent Bike  L2 x 6 min      Ankle Exercises: Stretches   Other Stretch  Gastroc/soleus + posterior tibialis stretch 2 x 30 sec (folded towel under lateral foot)    Other Stretch  Gastroc/soleus + peroneal stretch 2 x 30 sec (folded towel under medial foot)             PT Education - 04/06/19 0845    Education Details  Kinesiotape wearing instructions; modifcations to calf stretching to better isolate peroneals and posterior tibialis    Person(s) Educated  Patient    Methods  Explanation;Demonstration;Handout    Comprehension  Verbalized understanding;Returned demonstration       PT Short Term Goals - 03/16/19 0812      PT SHORT TERM GOAL #1   Title  Patient to be independent with initial HEP.    Time  3    Period  Weeks    Status  Achieved    Target Date  02/08/19        PT Long Term Goals - 03/16/19 0812      PT LONG TERM GOAL #1   Title  Patient to be independent with advanced HEP.    Time  7    Period  Weeks    Status  Partially Met   met for current   Target Date  05/04/19      PT LONG TERM GOAL #2   Title  Patient to demonstrate R ankle AROM WFL and without pain limiting.    Time  7    Period  Weeks    Status  Partially Met   improved R ankle plantarflexion   Target Date  05/04/19      PT LONG TERM GOAL #3   Title  Patient to demonstrate B LE strength >=4+/5.    Time  7    Period  Weeks    Status  Partially Met   still limited   Target Date  05/04/19      PT LONG TERM GOAL #4   Title  Patient to demonstrate R SLS for 30 sec without LOB.  Time  7    Period  Weeks    Status  On-going   able to perform 5 sec without LOB on R LE    Target Date  05/04/19      PT LONG TERM GOAL #5   Title  Patient to report return to golf with modifications as needed and no pain.    Time  7    Period  Weeks    Status  Unable to assess   reports that LBP is moreso limiting him in participating in golf that foot   Target Date  05/04/19            Plan - 04/06/19 0812    Clinical Impression Statement  Ashok noting expected initial post-needling muscle soreness but otherwise unsure of benefit from DN with pain still in "usual" places. Patient also noting pain at proximal 4th and 5th metatarsal and tarsal-metatarsal joints of R foot with feeling "like it is out of joint" - prominent 5th metatarsal tuberosity noted but no perceived malalignment and normal joint play present. Less tension noted areas where good twitch response elicited from DN last session (lateral gastroc, soleus, peroneals) but continued ttp over medial gastroc in areas deemed to be potentially more restricted by scar tissue. Trial of kinesiotape for posterior calf muscle group initiated with sensitive skin tape and instructed patient in modifications to calf stretching to better isolate peroneals and posterior tibialis. Patient to f/u with primary PT next visit to assess response.    Comorbidities  prostate CA, HLD, hay fever, GERD, chronic back pain, asthma, R achilles tendon repair, B knee arthroscopy,R finger surgery, cardiac cath 2004, ACDF 2016    Rehab Potential  Good    PT Treatment/Interventions  ADLs/Self Care Home Management;Cryotherapy;Electrical Stimulation;Iontophoresis 72m/ml Dexamethasone;Moist Heat;Balance training;Therapeutic exercise;Therapeutic activities;Functional mobility training;Stair training;Gait training;Ultrasound;Neuromuscular re-education;Patient/family education;Manual techniques;Vasopneumatic Device;Taping;Energy conservation;Dry needling;Passive range of motion;Scar mobilization    PT Next Visit Plan  20th visit PN; assess response to taping;  gentle ankle strengthening, stretching, modalities prn    Consulted and Agree with Plan of Care  Patient       Patient will benefit from skilled therapeutic intervention in order to improve the following deficits and impairments:  Increased edema, Decreased scar mobility, Decreased activity tolerance, Decreased strength, Pain, Increased fascial restricitons, Increased muscle spasms, Difficulty walking, Decreased balance, Decreased range of motion, Improper body mechanics, Postural dysfunction, Impaired flexibility  Visit Diagnosis: Pain in right ankle and joints of right foot  Stiffness of right ankle, not elsewhere classified  Muscle weakness (generalized)  Difficulty in walking, not elsewhere classified     Problem List Patient Active Problem List   Diagnosis Date Noted  . Infection, skin 07/26/2018  . Achilles tendon infection 07/26/2018  . Wound dehiscence, surgical 07/10/2018    Class: Acute  . Wound dehiscence, surgical, initial encounter 07/10/2018  . Spondylolisthesis of lumbar region 08/31/2016  . Neck pain 06/27/2014  . Prostate cancer ([facility]     JPercival Spanish PT, MPT 04/06/2019, 12:51 PM  [facility] [place] [address]Phone: [phone]  Fax:  [phone] Name: MJEHAN BONANOMRN: [id number] of Birth: 11/22/1950-07-01

## 2019-04-06 NOTE — Patient Instructions (Signed)
   Kinesiology tape  What is kinesiology tape?  There are many brands of kinesiology tape. KTape, Rock Tape, Body Sport, Dynamic tape, to name a few.  It is an elasticized tape designed to support the body's natural healing process. This tape provides stability and support to muscles and joints without restricting motion.  It can also help decrease swelling in the area of application.  How does it work?  The tape microscopically lifts and decompresses the skin to allow for drainage of lymph (swelling) to flow away from area, reducing inflammation. The tape has the ability to help re-educate the neuromuscular system by targeting specific receptors in the skin. The presence of the tape increases the body's awareness of posture and body mechanics.  Do not use with:  . Open wounds . Skin lesions . Adhesive allergies  In some rare cases, mild/moderate skin irritation can occur. This can include redness, itchiness, or hives. If this occurs, immediately remove tape and consult your primary care physician if symptoms are severe or do not resolve within 2 days.  Safe removal of the tape:  To remove tape safely, hold nearby skin with one hand and gentle roll tape down with other hand. You can apply oil or conditioner to tape while in shower prior to removal to loosen adhesive. DO NOT swiftly rip tape off like a band-aid, as this could cause skin tears and additional skin irritation.     For questions, please contact your therapist at:  [facility] [address]  [address] [place], [place], [address] Phone: [phone]   Fax:  [phone]     

## 2019-04-10 ENCOUNTER — Encounter: Payer: Medicare Other | Admitting: Physical Therapy

## 2019-04-11 ENCOUNTER — Other Ambulatory Visit: Payer: Self-pay

## 2019-04-11 ENCOUNTER — Ambulatory Visit: Payer: Medicare Other | Admitting: Physical Therapy

## 2019-04-11 ENCOUNTER — Encounter: Payer: Self-pay | Admitting: Physical Therapy

## 2019-04-11 DIAGNOSIS — M25671 Stiffness of right ankle, not elsewhere classified: Secondary | ICD-10-CM | POA: Diagnosis not present

## 2019-04-11 DIAGNOSIS — R262 Difficulty in walking, not elsewhere classified: Secondary | ICD-10-CM

## 2019-04-11 DIAGNOSIS — M6281 Muscle weakness (generalized): Secondary | ICD-10-CM

## 2019-04-11 DIAGNOSIS — M25571 Pain in right ankle and joints of right foot: Secondary | ICD-10-CM

## 2019-04-11 NOTE — Therapy (Signed)
[facility] [address]  [place], [place], [address] Phone: [phone]   Fax:  [phone]  Physical Therapy Progress Note  Patient Details  Name: Terry [name] MRN: [id number] Date of Birth: 02-14-1951 Referring Provider (PT): Mechele Claude, PA-C  Progress Note Reporting Period 02/16/19 to 04/11/19  See note below for Objective Data and Assessment of Progress/Goals.     Encounter Date: 04/11/2019  PT End of Session - 04/11/19 1039    Visit Number  20    Number of Visits  28    Date for PT Re-Evaluation  05/04/19    Authorization Type  Medicare ([place]) & BCBS    PT Start Time  [phone]    PT Stop Time  0928    PT Time Calculation (min)  41 min    Activity Tolerance  Patient tolerated treatment well;Patient limited by pain    Behavior During Therapy  [facility] for tasks assessed/performed       Past Medical History:  Diagnosis Date  . Arthritis    neck, back, knees, right ankle  . Bladder calculus   . Chronic back pain   . Complication of anesthesia    dizziness and "crazy amount of bile"  . GERD (gastroesophageal reflux disease)   . History of colon polyps    benign  . History of prostate cancer urologist-- dr Alinda Money   dx 2014---  s/p  prostatectomy 05-22-2013 ,  Gleason 3+3  . Hypercholesterolemia    takes Crestor daily  . Hyperlipidemia   . Wears glasses     Past Surgical History:  Procedure Laterality Date  . ACHILLES TENDON SURGERY Right 06-07-2018   _0    reconstruction  . ANTERIOR CERVICAL DECOMP/DISCECTOMY FUSION N/A 06/27/2014   Procedure: ANTERIOR CERVICAL DECOMPRESSION/DISCECTOMY FUSION C5-C7   (2 LEVELS);  Surgeon: Melina Schools, MD;  Location: [place];  Service: Orthopedics;  Laterality: N/A;  . CARDIAC CATHETERIZATION  2004  . COLONOSCOPY    . CYSTOSCOPY WITH LITHOLAPAXY N/A 03/27/2019   Procedure: CYSTOSCOPY WITH REMOVAL OF BLADDER STONE AND FOREIGN BODY;  Surgeon: Raynelle Bring, MD;   Location: [facility];  Service: Urology;  Laterality: N/A;  . EYE SURGERY     lazy eye as child, lasik surgery  . FINGER SURGERY Right    4th finger  . I&D EXTREMITY Right 07/10/2018   Procedure: IRRIGATION AND DEBRIDEMENT RIGHT ANKLE WOUND AND WOUND VAC PLACEMENT;  Surgeon: Wylene Simmer, MD;  Location: [place];  Service: Orthopedics;  Laterality: Right;  . KNEE ARTHROSCOPY Bilateral right ?;  left 05-25-2007  _1   . POSTERIOR LAMINECTOMY / DECOMPRESSION LUMBAR SPINE  08-31-2016    dr elsner  _2   . PROSTATE BIOPSY  04/13/13   gleason 3+3=6, vol 55.4 cc  . ROBOT ASSISTED LAPAROSCOPIC RADICAL PROSTATECTOMY N/A 05/22/2013   Procedure: ROBOTIC ASSISTED LAPAROSCOPIC RADICAL PROSTATECTOMY LEVEL 1;  Surgeon: Dutch Gray, MD;  Location: [facility];  Service: Urology;  Laterality: N/A;  . TONSILLECTOMY      There were no vitals filed for this visit.  Subjective Assessment - 04/11/19 0848    Subjective  Feels a little better- DN "maybe" helped. Has been able to walk faster the last couple of days. Reports 85% improvement since initial eval. Would like to continue working towards reducing pain levels.    Pertinent History  prostate CA, HLD, hay fever, GERD, chronic back pain, asthma, R achilles tendon repair, B knee arthroscopy,R finger surgery, cardiac cath  2004, ACDF 2016    Diagnostic tests  none    Patient Stated Goals  i want to be able to walk 3 miles    Currently in Pain?  Yes    Pain Score  1     Pain Location  Heel    Pain Orientation  Right    Pain Descriptors / Indicators  Dull    Pain Type  Chronic pain         [facility] PT Assessment - 04/11/19 0001      Observation/Other Assessments   Focus on Therapeutic Outcomes (FOTO)   49% (51% limitation, 39% predicted)       AROM   Right Ankle Dorsiflexion  12    Right Ankle Plantar Flexion  25    Right Ankle Inversion  22    Right Ankle Eversion  22      Strength   Right Hip Flexion  4/5    Right Hip ABduction  4+/5     Right Hip ADduction  4/5    Left Hip Flexion  4+/5    Left Hip ABduction  4+/5    Left Hip ADduction  4/5    Right Knee Flexion  4+/5    Right Knee Extension  4+/5    Left Knee Flexion  5/5    Left Knee Extension  4+/5    Right Ankle Dorsiflexion  4+/5    Right Ankle Plantar Flexion  2+/5    Right Ankle Inversion  4/5    Right Ankle Eversion  4-/5    Left Ankle Dorsiflexion  5/5    Left Ankle Plantar Flexion  2+/5    Left Ankle Inversion  4+/5    Left Ankle Eversion  4+/5                   [facility] Adult PT Treatment/Exercise - 04/11/19 0001      Neuro Re-ed    Neuro Re-ed Details   R SLS + cone tap x10; R SLS + 3 toe tap on dynadisc x10      Manual Therapy   Manual Therapy  Passive ROM;Joint mobilization    Manual therapy comments  supine    Joint Mobilization  R ankle dorsiflexion, plantarflexion, inversion, and eversion mobilizations grade III/IV to tolerance    Passive ROM  R ankle dorsiflexion, plantarflexion, inversion, and eversion PROM 3x20" to tolerance   muscle guarding throughout     Ankle Exercises: Aerobic   Recumbent Bike  L2 x 6 min      Ankle Exercises: Stretches   Other Stretch  R ankle plantarflexion stretch in standing at counter top 2x30" to tolerance    Other Stretch  R ankle plantarflexion stretch in sitting with 2 airex pads 2x30" to tolerance             PT Education - 04/11/19 1038    Education Details  update to HEP; discussion on objective progress with PT and remaining limitations    Person(s) Educated  Patient    Methods  Explanation;Demonstration;Tactile cues;Verbal cues;Handout    Comprehension  Verbalized understanding;Returned demonstration       PT Short Term Goals - 04/11/19 0851      PT SHORT TERM GOAL #1   Title  Patient to be independent with initial HEP.    Time  3    Period  Weeks    Status  Achieved    Target Date  02/08/19  PT Long Term Goals - 04/11/19 0851      PT LONG TERM GOAL #1   Title   Patient to be independent with advanced HEP.    Time  7    Period  Weeks    Status  Partially Met   met for current     PT LONG TERM GOAL #2   Title  Patient to demonstrate R ankle AROM WFL and without pain limiting.    Time  7    Period  Weeks    Status  Partially Met   improved R ankle inversion and eversion     PT LONG TERM GOAL #3   Title  Patient to demonstrate B LE strength >=4+/5.    Time  7    Period  Weeks    Status  Partially Met   improved L knee flexion and R ankle dorsiflexion     PT LONG TERM GOAL #4   Title  Patient to demonstrate R SLS for 30 sec without LOB.    Time  7    Period  Weeks    Status  On-going   able to perform 4 sec without LOB on R LE     PT LONG TERM GOAL #5   Title  Patient to report return to golf with modifications as needed and no pain.    Time  7    Period  Weeks    Status  Unable to assess   reports that LBP is moreso limiting him in participating in golf that foot           Plan - 04/11/19 1040    Clinical Impression Statement  Patient reporting 85% improvement in R ankle since initial eval. Notes that he would like to continue working towards reducing pain levels. Does note that he has been able to walk more quickly the last couple of days. Patient has demonstrate improvement in R ankle inversion and eversion AROM, however with plantarflexion AROM still severely limiting. Strength testing revealed improvement in L knee flexion and R ankle dorsiflexion strength. B calf strength still most limiting. R SLS continues to be a challenge for patient as well. Patient still notes that he has not attempted to play golf d/t LBP. Patient tolerated R ankle PROM and joint mobilizations for improvement in ROM. Limited by pain and muscle guarding today. Worked on self-stretching and balance training for improvement towards remaining limitations. Patient's progress is currently slow despite patient's report of compliance with HEP daily. Would continue  to benefit from skilled PT services to address remaining goals.    Comorbidities  prostate CA, HLD, hay fever, GERD, chronic back pain, asthma, R achilles tendon repair, B knee arthroscopy,R finger surgery, cardiac cath 2004, ACDF 2016    Rehab Potential  Good    PT Treatment/Interventions  ADLs/Self Care Home Management;Cryotherapy;Electrical Stimulation;Iontophoresis 28m/ml Dexamethasone;Moist Heat;Balance training;Therapeutic exercise;Therapeutic activities;Functional mobility training;Stair training;Gait training;Ultrasound;Neuromuscular re-education;Patient/family education;Manual techniques;Vasopneumatic Device;Taping;Energy conservation;Dry needling;Passive range of motion;Scar mobilization    PT Next Visit Plan  assess response to taping; gentle ankle strengthening, stretching, modalities prn    Consulted and Agree with Plan of Care  Patient       Patient will benefit from skilled therapeutic intervention in order to improve the following deficits and impairments:  Increased edema, Decreased scar mobility, Decreased activity tolerance, Decreased strength, Pain, Increased fascial restricitons, Increased muscle spasms, Difficulty walking, Decreased balance, Decreased range of motion, Improper body mechanics, Postural dysfunction, Impaired flexibility  Visit Diagnosis: Pain in  right ankle and joints of right foot  Stiffness of right ankle, not elsewhere classified  Muscle weakness (generalized)  Difficulty in walking, not elsewhere classified     Problem List Patient Active Problem List   Diagnosis Date Noted  . Infection, skin 07/26/2018  . Achilles tendon infection 07/26/2018  . Wound dehiscence, surgical 07/10/2018    Class: Acute  . Wound dehiscence, surgical, initial encounter 07/10/2018  . Spondylolisthesis of lumbar region 08/31/2016  . Neck pain 06/27/2014  . Prostate cancer [facility])      Janene Harvey, PT, DPT 04/11/19 10:49 AM   [facility] [address]  [place], [place], [address] Phone: [phone]   Fax:  [phone]  Name: Terry [name] MRN: [id number] Date of Birth: 08-01-1950

## 2019-04-13 ENCOUNTER — Ambulatory Visit: Payer: Medicare Other

## 2019-04-13 ENCOUNTER — Other Ambulatory Visit: Payer: Self-pay

## 2019-04-13 DIAGNOSIS — M6281 Muscle weakness (generalized): Secondary | ICD-10-CM

## 2019-04-13 DIAGNOSIS — M25671 Stiffness of right ankle, not elsewhere classified: Secondary | ICD-10-CM

## 2019-04-13 DIAGNOSIS — R262 Difficulty in walking, not elsewhere classified: Secondary | ICD-10-CM

## 2019-04-13 DIAGNOSIS — M25571 Pain in right ankle and joints of right foot: Secondary | ICD-10-CM

## 2019-04-13 NOTE — Therapy (Signed)
[facility] [address]  [place], [place], [address] Phone: [phone]   Fax:  [phone]  Physical Therapy Treatment  Patient Details  Name: Terry [name] MRN: [id number] Date of Birth: [birth year] Referring Provider (PT): Mechele Claude, [place]   Encounter Date: 04/13/2019  PT End of Session - 04/13/19 0810    Visit Number  21    Number of Visits  28    Date for PT Re-Evaluation  05/04/19    Authorization Type  Medicare ([place]) & BCBS    PT Start Time  0806    PT Stop Time  0844    PT Time Calculation (min)  38 min    Activity Tolerance  Patient tolerated treatment well;Patient limited by pain    Behavior During Therapy  [facility] for tasks assessed/performed       Past Medical History:  Diagnosis Date  . Arthritis    neck, back, knees, right ankle  . Bladder calculus   . Chronic back pain   . Complication of anesthesia    dizziness and "crazy amount of bile"  . GERD (gastroesophageal reflux disease)   . History of colon polyps    benign  . History of prostate cancer urologist-- dr Alinda Money   dx 2014---  s/p  prostatectomy 05-22-2013 ,  Gleason 3+3  . Hypercholesterolemia    takes Crestor daily  . Hyperlipidemia   . Wears glasses     Past Surgical History:  Procedure Laterality Date  . ACHILLES TENDON SURGERY Right 06-07-2018   '@[facility]'    reconstruction  . ANTERIOR CERVICAL DECOMP/DISCECTOMY FUSION N/A 06/27/2014   Procedure: ANTERIOR CERVICAL DECOMPRESSION/DISCECTOMY FUSION C5-C7   (2 LEVELS);  Surgeon: Melina Schools, MD;  Location: [place];  Service: Orthopedics;  Laterality: N/A;  . CARDIAC CATHETERIZATION  2004  . COLONOSCOPY    . CYSTOSCOPY WITH LITHOLAPAXY N/A 03/27/2019   Procedure: CYSTOSCOPY WITH REMOVAL OF BLADDER STONE AND FOREIGN BODY;  Surgeon: Raynelle Bring, MD;  Location: [facility];  Service: Urology;  Laterality: N/A;  . EYE SURGERY     lazy eye as child, lasik surgery  . FINGER  SURGERY Right    4th finger  . I&D EXTREMITY Right 07/10/2018   Procedure: IRRIGATION AND DEBRIDEMENT RIGHT ANKLE WOUND AND WOUND VAC PLACEMENT;  Surgeon: Wylene Simmer, MD;  Location: [place];  Service: Orthopedics;  Laterality: Right;  . KNEE ARTHROSCOPY Bilateral right ?;  left 05-25-2007  '@[facility]'   . POSTERIOR LAMINECTOMY / DECOMPRESSION LUMBAR SPINE  08-31-2016    dr elsner  '@[facility]'   . PROSTATE BIOPSY  04/13/13   gleason 3+3=6, vol 55.4 cc  . ROBOT ASSISTED LAPAROSCOPIC RADICAL PROSTATECTOMY N/A 05/22/2013   Procedure: ROBOTIC ASSISTED LAPAROSCOPIC RADICAL PROSTATECTOMY LEVEL 1;  Surgeon: Dutch Gray, MD;  Location: [facility];  Service: Urology;  Laterality: N/A;  . TONSILLECTOMY      There were no vitals filed for this visit.  Subjective Assessment - 04/13/19 0808    Subjective  Pt. reporting lower calf pain "is the same" today.    Pertinent History  prostate CA, HLD, hay fever, GERD, chronic back pain, asthma, R achilles tendon repair, B knee arthroscopy,R finger surgery, cardiac cath 2004, ACDF 2016    Diagnostic tests  none    Patient Stated Goals  i want to be able to walk 3 miles    Currently in Pain?  Yes    Pain Score  2     Pain  Location  Heel    Pain Orientation  Right    Pain Descriptors / Indicators  Dull    Pain Type  Chronic pain                       [facility] Adult PT Treatment/Exercise - 04/13/19 0001      Knee/Hip Exercises: Machines for Strengthening   Cybex Leg Press  B LE's calf raise bent x 1 set, straight knee x 1 set; 25# x 15 reps    tactile cues required at knees to prevent motion      Knee/Hip Exercises: Standing   Heel Raises  Both;3 seconds   x 12 at counter    Functional Squat  10 reps;3 seconds    Functional Squat Limitations  TRX+ heel raise       Manual Therapy   Manual Therapy  Manual Traction    Manual therapy comments  proen     Soft tissue mobilization  STM to R medial and lateral gastroc/soleus complex     Myofascial Release  manual  TPR to medial soleus       Kinesiotix   Inhibit Muscle   R gastroc/soleus - 3 strips - 30-50% from inferior heel along posterior heelcord to upper calf, medial & lateral inferior heel crossing over heelcord to opp lateral & medial calf   sensitive skin tape      Ankle Exercises: Stretches   Gastroc Stretch  1 rep;30 seconds   runners stretch into counter      Ankle Exercises: Supine   T-Band  R PF blue TB strengthening x 20 reps              PT Education - 04/13/19 0920    Education Details  HEP update: long-sitting PF with blue TB issued to pt.    Person(s) Educated  Patient    Methods  Explanation;Demonstration;Verbal cues    Comprehension  Verbalized understanding;Returned demonstration;Verbal cues required       PT Short Term Goals - 04/11/19 0851      PT SHORT TERM GOAL #1   Title  Patient to be independent with initial HEP.    Time  3    Period  Weeks    Status  Achieved    Target Date  02/08/19        PT Long Term Goals - 04/11/19 0851      PT LONG TERM GOAL #1   Title  Patient to be independent with advanced HEP.    Time  7    Period  Weeks    Status  Partially Met   met for current     PT LONG TERM GOAL #2   Title  Patient to demonstrate R ankle AROM WFL and without pain limiting.    Time  7    Period  Weeks    Status  Partially Met   improved R ankle inversion and eversion     PT LONG TERM GOAL #3   Title  Patient to demonstrate B LE strength >=4+/5.    Time  7    Period  Weeks    Status  Partially Met   improved L knee flexion and R ankle dorsiflexion     PT LONG TERM GOAL #4   Title  Patient to demonstrate R SLS for 30 sec without LOB.    Time  7    Period  Weeks    Status  On-going  able to perform 4 sec without LOB on R LE     PT LONG TERM GOAL #5   Title  Patient to report return to golf with modifications as needed and no pain.    Time  7    Period  Weeks    Status  Unable to assess   reports that LBP is moreso limiting him  in participating in golf that foot           Plan - 04/13/19 0810    Clinical Impression Statement  Terry [name] reporting unsure of taping benefit from last session.  Did opt to re-apply today to check for pain relief benefit.  Session focused on PF strengthening in varying positions with machine and band resistance.  Pt. requiring cueing for proper position and to prevent substitutions throughout session today.  Able to complete all therex without increased pain.    Comorbidities  prostate CA, HLD, hay fever, GERD, chronic back pain, asthma, R achilles tendon repair, B knee arthroscopy,R finger surgery, cardiac cath 2004, ACDF 2016    Rehab Potential  Good    PT Treatment/Interventions  ADLs/Self Care Home Management;Cryotherapy;Electrical Stimulation;Iontophoresis 61m/ml Dexamethasone;Moist Heat;Balance training;Therapeutic exercise;Therapeutic activities;Functional mobility training;Stair training;Gait training;Ultrasound;Neuromuscular re-education;Patient/family education;Manual techniques;Vasopneumatic Device;Taping;Energy conservation;Dry needling;Passive range of motion;Scar mobilization    PT Next Visit Plan  Gentle ankle strengthening, stretching, modalities prn    Consulted and Agree with Plan of Care  Patient       Patient will benefit from skilled therapeutic intervention in order to improve the following deficits and impairments:  Increased edema, Decreased scar mobility, Decreased activity tolerance, Decreased strength, Pain, Increased fascial restricitons, Increased muscle spasms, Difficulty walking, Decreased balance, Decreased range of motion, Improper body mechanics, Postural dysfunction, Impaired flexibility  Visit Diagnosis: Pain in right ankle and joints of right foot  Stiffness of right ankle, not elsewhere classified  Muscle weakness (generalized)  Difficulty in walking, not elsewhere classified     Problem List Patient Active Problem List   Diagnosis Date Noted  .  Infection, skin 07/26/2018  . Achilles tendon infection 07/26/2018  . Wound dehiscence, surgical 07/10/2018    Class: Acute  . Wound dehiscence, surgical, initial encounter 07/10/2018  . Spondylolisthesis of lumbar region 08/31/2016  . Neck pain 06/27/2014  . Prostate cancer ([facility]     MBess Harvest PTA 04/13/19 12:56 PM  [facility] [address] [place] [address]: [phone]  Fax:  [phone] Name: Terry POLHAMUSMRN: [id number] of Birth: 919-Jun-1952

## 2019-04-14 DIAGNOSIS — M4317 Spondylolisthesis, lumbosacral region: Secondary | ICD-10-CM | POA: Diagnosis not present

## 2019-04-17 ENCOUNTER — Other Ambulatory Visit: Payer: Self-pay

## 2019-04-17 ENCOUNTER — Encounter: Payer: Self-pay | Admitting: Physical Therapy

## 2019-04-17 ENCOUNTER — Ambulatory Visit: Payer: Medicare Other | Admitting: Physical Therapy

## 2019-04-17 DIAGNOSIS — M6281 Muscle weakness (generalized): Secondary | ICD-10-CM

## 2019-04-17 DIAGNOSIS — R262 Difficulty in walking, not elsewhere classified: Secondary | ICD-10-CM | POA: Diagnosis not present

## 2019-04-17 DIAGNOSIS — M25671 Stiffness of right ankle, not elsewhere classified: Secondary | ICD-10-CM | POA: Diagnosis not present

## 2019-04-17 DIAGNOSIS — M25571 Pain in right ankle and joints of right foot: Secondary | ICD-10-CM | POA: Diagnosis not present

## 2019-04-17 NOTE — Therapy (Signed)
[facility] [address]  [place], [place], [address] Phone: [phone]   Fax:  [phone]  Physical Therapy Treatment  Patient Details  Name: Terry [name] MRN: [id number] Date of Birth: 1951/01/21 Referring Provider (PT): Mechele Claude, [place]   Encounter Date: 04/17/2019  PT End of Session - 04/17/19 0804    Visit Number  22    Number of Visits  28    Date for PT Re-Evaluation  05/04/19    Authorization Type  Medicare ([place]) & BCBS    PT Start Time  0804    PT Stop Time  0854    PT Time Calculation (min)  50 min    Activity Tolerance  Patient tolerated treatment well;Patient limited by pain    Behavior During Therapy  [facility] for tasks assessed/performed       Past Medical History:  Diagnosis Date  . Arthritis    neck, back, knees, right ankle  . Bladder calculus   . Chronic back pain   . Complication of anesthesia    dizziness and "crazy amount of bile"  . GERD (gastroesophageal reflux disease)   . History of colon polyps    benign  . History of prostate cancer urologist-- dr Alinda Money   dx 2014---  s/p  prostatectomy 05-22-2013 ,  Gleason 3+3  . Hypercholesterolemia    takes Crestor daily  . Hyperlipidemia   . Wears glasses     Past Surgical History:  Procedure Laterality Date  . ACHILLES TENDON SURGERY Right 06-07-2018   '@[facility]'    reconstruction  . ANTERIOR CERVICAL DECOMP/DISCECTOMY FUSION N/A 06/27/2014   Procedure: ANTERIOR CERVICAL DECOMPRESSION/DISCECTOMY FUSION C5-C7   (2 LEVELS);  Surgeon: Melina Schools, MD;  Location: [place];  Service: Orthopedics;  Laterality: N/A;  . CARDIAC CATHETERIZATION  2004  . COLONOSCOPY    . CYSTOSCOPY WITH LITHOLAPAXY N/A 03/27/2019   Procedure: CYSTOSCOPY WITH REMOVAL OF BLADDER STONE AND FOREIGN BODY;  Surgeon: Raynelle Bring, MD;  Location: [facility];  Service: Urology;  Laterality: N/A;  . EYE SURGERY     lazy eye as child, lasik surgery  . FINGER  SURGERY Right    4th finger  . I&D EXTREMITY Right 07/10/2018   Procedure: IRRIGATION AND DEBRIDEMENT RIGHT ANKLE WOUND AND WOUND VAC PLACEMENT;  Surgeon: Wylene Simmer, MD;  Location: [place];  Service: Orthopedics;  Laterality: Right;  . KNEE ARTHROSCOPY Bilateral right ?;  left 05-25-2007  '@[facility]'   . POSTERIOR LAMINECTOMY / DECOMPRESSION LUMBAR SPINE  08-31-2016    dr elsner  '@[facility]'   . PROSTATE BIOPSY  04/13/13   gleason 3+3=6, vol 55.4 cc  . ROBOT ASSISTED LAPAROSCOPIC RADICAL PROSTATECTOMY N/A 05/22/2013   Procedure: ROBOTIC ASSISTED LAPAROSCOPIC RADICAL PROSTATECTOMY LEVEL 1;  Surgeon: Dutch Gray, MD;  Location: [facility];  Service: Urology;  Laterality: N/A;  . TONSILLECTOMY      There were no vitals filed for this visit.  Subjective Assessment - 04/17/19 0806    Subjective  Pt reporting pain typically minimal in the morning, ramping up as the day progresses with increased swelling. Latest taping seems to "move the pain" from the upper calf to the heelcord. States he is awaiting a lumbar MRI this week and expects to have injection(s) vs possible repeat surgery.    Pertinent History  prostate CA, HLD, hay fever, GERD, chronic back pain, asthma, R achilles tendon repair, B knee arthroscopy,R finger surgery, cardiac cath 2004, ACDF 2016  Diagnostic tests  none    Patient Stated Goals  i want to be able to walk 3 miles    Currently in Pain?  Yes    Pain Score  1    1-1.5/10   Pain Location  Ankle    Pain Orientation  Right    Pain Descriptors / Indicators  Dull    Pain Type  Chronic pain    Pain Frequency  Intermittent                       [facility] Adult PT Treatment/Exercise - 04/17/19 0804      Manual Therapy   Manual Therapy  Soft tissue mobilization;Myofascial release;Taping    Manual therapy comments  prone     Soft tissue mobilization  STM to R medial and lateral gastroc/soleus complex, flexor digitorum longus and peroneals- several tender trigger points throughout;     Myofascial Release  Manual TPR to medial and lateral gastroc/soleus and peroneals    Kinesiotex  Inhibit Muscle      Kinesiotix   Inhibit Muscle   R gastroc/soleus - 3 strips - 30-50% from inferior heel along posterior heelcord to upper calf, medial & lateral inferior heel crossing over heelcord to opp lateral & medial calf   regular KinesioTex tape     Ankle Exercises: Aerobic   Recumbent Bike  L2 x 6 min       Trigger Point Dry Needling - 04/17/19 0804    Consent Given?  Yes    Muscles Treated Lower Quadrant  Peroneals;Posterior tibialis;Gastrocnemius;Soleus;Flexor Digitorum Longus   R LE   Peroneals Response  Twitch response elicited;Palpable increased muscle length    Posterior tibialis Response  Twitch response elicited;Palpable increased muscle length    Gastrocnemius Response  Twitch response elicited;Palpable increased muscle length    Soleus Response  Twitch response elicited;Palpable increased muscle length    Flexor digitorum longus Response  Twitch response elicited;Palpable increased muscle length             PT Short Term Goals - 04/11/19 0851      PT SHORT TERM GOAL #1   Title  Patient to be independent with initial HEP.    Time  3    Period  Weeks    Status  Achieved    Target Date  02/08/19        PT Long Term Goals - 04/11/19 0851      PT LONG TERM GOAL #1   Title  Patient to be independent with advanced HEP.    Time  7    Period  Weeks    Status  Partially Met   met for current     PT LONG TERM GOAL #2   Title  Patient to demonstrate R ankle AROM WFL and without pain limiting.    Time  7    Period  Weeks    Status  Partially Met   improved R ankle inversion and eversion     PT LONG TERM GOAL #3   Title  Patient to demonstrate B LE strength >=4+/5.    Time  7    Period  Weeks    Status  Partially Met   improved L knee flexion and R ankle dorsiflexion     PT LONG TERM GOAL #4   Title  Patient to demonstrate R SLS for 30 sec without  LOB.    Time  7    Period  Weeks  Status  On-going   able to perform 4 sec without LOB on R LE     PT LONG TERM GOAL #5   Title  Patient to report return to golf with modifications as needed and no pain.    Time  7    Period  Weeks    Status  Unable to assess   reports that LBP is moreso limiting him in participating in golf that foot           Plan - 04/17/19 0810    Clinical Impression Statement  Dayan reporting minimal pain this morning but states pain and swelling typically progresses throughout the day. Did note some benefit from initial session of DN with ongoing TPs and taut bands identifies with ttp reported , therefore focused on manual therapy with DN t/o R posterior and medial/lateral calf musculature followed by retrograde massage for edema reduction with palpable reduction in muscle tension and edema noted. Treatment concluded with reapplication of kinesiotape as patient also feels like this has helped on previous applications.    Comorbidities  prostate CA, HLD, hay fever, GERD, chronic back pain, asthma, R achilles tendon repair, B knee arthroscopy,R finger surgery, cardiac cath 2004, ACDF 2016    Rehab Potential  Good    PT Treatment/Interventions  ADLs/Self Care Home Management;Cryotherapy;Electrical Stimulation;Iontophoresis 26m/ml Dexamethasone;Moist Heat;Balance training;Therapeutic exercise;Therapeutic activities;Functional mobility training;Stair training;Gait training;Ultrasound;Neuromuscular re-education;Patient/family education;Manual techniques;Vasopneumatic Device;Taping;Energy conservation;Dry needling;Passive range of motion;Scar mobilization    PT Next Visit Plan  Gentle ankle strengthening, stretching, modalities prn    Consulted and Agree with Plan of Care  Patient       Patient will benefit from skilled therapeutic intervention in order to improve the following deficits and impairments:  Increased edema, Decreased scar mobility, Decreased activity  tolerance, Decreased strength, Pain, Increased fascial restricitons, Increased muscle spasms, Difficulty walking, Decreased balance, Decreased range of motion, Improper body mechanics, Postural dysfunction, Impaired flexibility  Visit Diagnosis: Pain in right ankle and joints of right foot  Stiffness of right ankle, not elsewhere classified  Muscle weakness (generalized)  Difficulty in walking, not elsewhere classified     Problem List Patient Active Problem List   Diagnosis Date Noted  . Infection, skin 07/26/2018  . Achilles tendon infection 07/26/2018  . Wound dehiscence, surgical 07/10/2018    Class: Acute  . Wound dehiscence, surgical, initial encounter 07/10/2018  . Spondylolisthesis of lumbar region 08/31/2016  . Neck pain 06/27/2014  . Prostate cancer ([facility]     JPercival Spanish PT, MPT 04/17/2019, 9:09 AM  [facility] [place] [address]Phone: [phone]  Fax:  [phone] Name: MSCHAWN BYASMRN: [id number] of Birth: 902-19-1952

## 2019-04-18 ENCOUNTER — Ambulatory Visit: Payer: Medicare Other

## 2019-04-20 ENCOUNTER — Ambulatory Visit: Payer: Medicare Other | Admitting: Physical Therapy

## 2019-04-20 ENCOUNTER — Other Ambulatory Visit: Payer: Self-pay

## 2019-04-20 ENCOUNTER — Encounter: Payer: Self-pay | Admitting: Physical Therapy

## 2019-04-20 DIAGNOSIS — N210 Calculus in bladder: Secondary | ICD-10-CM | POA: Diagnosis not present

## 2019-04-20 DIAGNOSIS — R262 Difficulty in walking, not elsewhere classified: Secondary | ICD-10-CM

## 2019-04-20 DIAGNOSIS — M25571 Pain in right ankle and joints of right foot: Secondary | ICD-10-CM

## 2019-04-20 DIAGNOSIS — M4316 Spondylolisthesis, lumbar region: Secondary | ICD-10-CM | POA: Diagnosis not present

## 2019-04-20 DIAGNOSIS — M6281 Muscle weakness (generalized): Secondary | ICD-10-CM | POA: Diagnosis not present

## 2019-04-20 DIAGNOSIS — Z8546 Personal history of malignant neoplasm of prostate: Secondary | ICD-10-CM | POA: Diagnosis not present

## 2019-04-20 DIAGNOSIS — M25671 Stiffness of right ankle, not elsewhere classified: Secondary | ICD-10-CM | POA: Diagnosis not present

## 2019-04-20 NOTE — Therapy (Signed)
[facility] [address]  [place], [place], [address] Phone: [phone]   Fax:  [phone]  Physical Therapy Treatment  Patient Details  Name: Terry [name] MRN: [id number] Date of Birth: 1950-09-11 Referring Provider (PT): Mechele Claude, [place]   Encounter Date: 04/20/2019  PT End of Session - 04/20/19 0928    Visit Number  23    Number of Visits  28    Date for PT Re-Evaluation  05/04/19    Authorization Type  Medicare ([place]X) & BCBS    PT Start Time  [phone]   pt in bathroom   PT Stop Time  0927    PT Time Calculation (min)  32 min    Activity Tolerance  Patient tolerated treatment well    Behavior During Therapy  [facility] for tasks assessed/performed       Past Medical History:  Diagnosis Date  . Arthritis    neck, back, knees, right ankle  . Bladder calculus   . Chronic back pain   . Complication of anesthesia    dizziness and "crazy amount of bile"  . GERD (gastroesophageal reflux disease)   . History of colon polyps    benign  . History of prostate cancer urologist-- dr Alinda Money   dx 2014---  s/p  prostatectomy 05-22-2013 ,  Gleason 3+3  . Hypercholesterolemia    takes Crestor daily  . Hyperlipidemia   . Wears glasses     Past Surgical History:  Procedure Laterality Date  . ACHILLES TENDON SURGERY Right 06-07-2018   _0    reconstruction  . ANTERIOR CERVICAL DECOMP/DISCECTOMY FUSION N/A 06/27/2014   Procedure: ANTERIOR CERVICAL DECOMPRESSION/DISCECTOMY FUSION C5-C7   (2 LEVELS);  Surgeon: Melina Schools, MD;  Location: [place];  Service: Orthopedics;  Laterality: N/A;  . CARDIAC CATHETERIZATION  2004  . COLONOSCOPY    . CYSTOSCOPY WITH LITHOLAPAXY N/A 03/27/2019   Procedure: CYSTOSCOPY WITH REMOVAL OF BLADDER STONE AND FOREIGN BODY;  Surgeon: Raynelle Bring, MD;  Location: [facility];  Service: Urology;  Laterality: N/A;  . EYE SURGERY     lazy eye as child, lasik surgery  . FINGER  SURGERY Right    4th finger  . I&D EXTREMITY Right 07/10/2018   Procedure: IRRIGATION AND DEBRIDEMENT RIGHT ANKLE WOUND AND WOUND VAC PLACEMENT;  Surgeon: Wylene Simmer, MD;  Location: [place];  Service: Orthopedics;  Laterality: Right;  . KNEE ARTHROSCOPY Bilateral right ?;  left 05-25-2007  _1   . POSTERIOR LAMINECTOMY / DECOMPRESSION LUMBAR SPINE  08-31-2016    dr elsner  _2   . PROSTATE BIOPSY  04/13/13   gleason 3+3=6, vol 55.4 cc  . ROBOT ASSISTED LAPAROSCOPIC RADICAL PROSTATECTOMY N/A 05/22/2013   Procedure: ROBOTIC ASSISTED LAPAROSCOPIC RADICAL PROSTATECTOMY LEVEL 1;  Surgeon: Dutch Gray, MD;  Location[facility];  Service: Urology;  Laterality: N/A;  . TONSILLECTOMY      There were no vitals filed for this visit.  Subjective Assessment - 04/20/19 0851    Subjective  Feeling pretty good since DN last session. Feels DN is "probably" helping.    Pertinent History  prostate CA, HLD, hay fever, GERD, chronic back pain, asthma, R achilles tendon repair, B knee arthroscopy,R finger surgery, cardiac cath 2004, ACDF 2016    Diagnostic tests  none    Patient Stated Goals  i want to be able to walk 3 miles    Currently in Pain?  Yes    Pain Score  1  Pain Location  Heel    Pain Orientation  Right    Pain Descriptors / Indicators  Dull    Pain Type  Chronic pain                       [facility] Adult PT Treatment/Exercise - 04/20/19 0001      Knee/Hip Exercises: Standing   Forward Step Up  Right;1 set;Hand Hold: 1;Step Height: 6";10 reps    Forward Step Up Limitations  2x10; R step up + opposite SKTC    Other Standing Knee Exercises  R step up/back on foam x10 at counter      Ankle Exercises: Aerobic   Recumbent Bike  L2 x 6 min      Ankle Exercises: Stretches   Other Stretch  R ankle plantarflexion stretch in standing at counter top 2x30" to tolerance    Other Stretch  R ankle plantarflexion stretch in sitting with 2 airex pads 2x30" to tolerance      Ankle Exercises:  Standing   Heel Raises  10 reps    Heel Raises Limitations  B heel raise, R eccentric lower at counter top   significant difficulty   Other Standing Ankle Exercises  B assisted heel raise w/ lat pull set at 175lbs 2x10             PT Education - 04/20/19 0928    Education Details  update to HEP    Person(s) Educated  Patient    Methods  Explanation;Demonstration;Tactile cues;Verbal cues;Handout    Comprehension  Verbalized understanding;Returned demonstration       PT Short Term Goals - 04/11/19 0851      PT SHORT TERM GOAL #1   Title  Patient to be independent with initial HEP.    Time  3    Period  Weeks    Status  Achieved    Target Date  02/08/19        PT Long Term Goals - 04/11/19 0851      PT LONG TERM GOAL #1   Title  Patient to be independent with advanced HEP.    Time  7    Period  Weeks    Status  Partially Met   met for current     PT LONG TERM GOAL #2   Title  Patient to demonstrate R ankle AROM WFL and without pain limiting.    Time  7    Period  Weeks    Status  Partially Met   improved R ankle inversion and eversion     PT LONG TERM GOAL #3   Title  Patient to demonstrate B LE strength >=4+/5.    Time  7    Period  Weeks    Status  Partially Met   improved L knee flexion and R ankle dorsiflexion     PT LONG TERM GOAL #4   Title  Patient to demonstrate R SLS for 30 sec without LOB.    Time  7    Period  Weeks    Status  On-going   able to perform 4 sec without LOB on R LE     PT LONG TERM GOAL #5   Title  Patient to report return to golf with modifications as needed and no pain.    Time  7    Period  Weeks    Status  Unable to assess   reports that LBP is moreso limiting him in  participating in golf that foot           Plan - 04/20/19 0928    Clinical Impression Statement  Patient reporting that DN last session "probably" helped him. Today with low pain levels at beginning of session. Began appointment with review of  plantarflexion stretching in sitting and standing with slight observable improvement in flexibility and tolerance. Patient demonstrating significant difficulty with heel raise activities today, demonstrating knee flexion as compensation for gastroc weakness. Cued patient to avoid this compensation with intermittent correction. Demonstrated apprehension with step up with opposite [facility] with improved confidence during second set. Did demonstrate ankle instability with step ups on foam as well. Updated HEP with step ups to improve dynamic standing balance. Patient reported understanding and with no complaints at end of session.    Comorbidities  prostate CA, HLD, hay fever, GERD, chronic back pain, asthma, R achilles tendon repair, B knee arthroscopy,R finger surgery, cardiac cath 2004, ACDF 2016    Rehab Potential  Good    PT Treatment/Interventions  ADLs/Self Care Home Management;Cryotherapy;Electrical Stimulation;Iontophoresis 79m/ml Dexamethasone;Moist Heat;Balance training;Therapeutic exercise;Therapeutic activities;Functional mobility training;Stair training;Gait training;Ultrasound;Neuromuscular re-education;Patient/family education;Manual techniques;Vasopneumatic Device;Taping;Energy conservation;Dry needling;Passive range of motion;Scar mobilization    PT Next Visit Plan  Gentle ankle strengthening, stretching, modalities prn    Consulted and Agree with Plan of Care  Patient       Patient will benefit from skilled therapeutic intervention in order to improve the following deficits and impairments:  Increased edema, Decreased scar mobility, Decreased activity tolerance, Decreased strength, Pain, Increased fascial restricitons, Increased muscle spasms, Difficulty walking, Decreased balance, Decreased range of motion, Improper body mechanics, Postural dysfunction, Impaired flexibility  Visit Diagnosis: Pain in right ankle and joints of right foot  Stiffness of right ankle, not elsewhere  classified  Muscle weakness (generalized)  Difficulty in walking, not elsewhere classified     Problem List Patient Active Problem List   Diagnosis Date Noted  . Infection, skin 07/26/2018  . Achilles tendon infection 07/26/2018  . Wound dehiscence, surgical 07/10/2018    Class: Acute  . Wound dehiscence, surgical, initial encounter 07/10/2018  . Spondylolisthesis of lumbar region 08/31/2016  . Neck pain 06/27/2014  . Prostate cancer ([facility]      YJanene Harvey PT, DPT 04/20/19 10:58 AM   [facility] [place] [address]Phone: [phone]  Fax:  [phone] Name: Terry GOSSARDMRN: [id number] of Birth: 903-06-1950

## 2019-04-25 DIAGNOSIS — M5116 Intervertebral disc disorders with radiculopathy, lumbar region: Secondary | ICD-10-CM | POA: Diagnosis not present

## 2019-05-02 ENCOUNTER — Other Ambulatory Visit: Payer: Self-pay

## 2019-05-02 ENCOUNTER — Encounter: Payer: Self-pay | Admitting: Physical Therapy

## 2019-05-02 ENCOUNTER — Ambulatory Visit: Payer: Medicare Other | Attending: Orthopedic Surgery | Admitting: Physical Therapy

## 2019-05-02 DIAGNOSIS — M62830 Muscle spasm of back: Secondary | ICD-10-CM | POA: Insufficient documentation

## 2019-05-02 DIAGNOSIS — M25571 Pain in right ankle and joints of right foot: Secondary | ICD-10-CM | POA: Insufficient documentation

## 2019-05-02 DIAGNOSIS — M5417 Radiculopathy, lumbosacral region: Secondary | ICD-10-CM | POA: Diagnosis present

## 2019-05-02 DIAGNOSIS — R252 Cramp and spasm: Secondary | ICD-10-CM | POA: Diagnosis present

## 2019-05-02 DIAGNOSIS — M5126 Other intervertebral disc displacement, lumbar region: Secondary | ICD-10-CM | POA: Diagnosis not present

## 2019-05-02 DIAGNOSIS — M6281 Muscle weakness (generalized): Secondary | ICD-10-CM | POA: Insufficient documentation

## 2019-05-02 DIAGNOSIS — R262 Difficulty in walking, not elsewhere classified: Secondary | ICD-10-CM | POA: Diagnosis present

## 2019-05-02 DIAGNOSIS — M47816 Spondylosis without myelopathy or radiculopathy, lumbar region: Secondary | ICD-10-CM | POA: Diagnosis not present

## 2019-05-02 DIAGNOSIS — M4316 Spondylolisthesis, lumbar region: Secondary | ICD-10-CM | POA: Diagnosis not present

## 2019-05-02 DIAGNOSIS — G8929 Other chronic pain: Secondary | ICD-10-CM | POA: Insufficient documentation

## 2019-05-02 DIAGNOSIS — M5441 Lumbago with sciatica, right side: Secondary | ICD-10-CM | POA: Insufficient documentation

## 2019-05-02 DIAGNOSIS — M48061 Spinal stenosis, lumbar region without neurogenic claudication: Secondary | ICD-10-CM | POA: Diagnosis not present

## 2019-05-02 DIAGNOSIS — M25671 Stiffness of right ankle, not elsewhere classified: Secondary | ICD-10-CM | POA: Insufficient documentation
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
## 2019-05-03 DIAGNOSIS — M5136 Other intervertebral disc degeneration, lumbar region: Secondary | ICD-10-CM | POA: Diagnosis not present

## 2019-05-03 DIAGNOSIS — M4316 Spondylolisthesis, lumbar region: Secondary | ICD-10-CM | POA: Diagnosis not present

## 2019-05-04 ENCOUNTER — Encounter: Payer: Self-pay | Admitting: Physical Therapy

## 2019-05-04 ENCOUNTER — Other Ambulatory Visit: Payer: Self-pay

## 2019-05-04 ENCOUNTER — Ambulatory Visit: Payer: Medicare Other | Admitting: Physical Therapy

## 2019-05-04 DIAGNOSIS — M6281 Muscle weakness (generalized): Secondary | ICD-10-CM

## 2019-05-04 DIAGNOSIS — M25671 Stiffness of right ankle, not elsewhere classified: Secondary | ICD-10-CM

## 2019-05-04 DIAGNOSIS — M25571 Pain in right ankle and joints of right foot: Secondary | ICD-10-CM | POA: Diagnosis not present

## 2019-05-04 DIAGNOSIS — R262 Difficulty in walking, not elsewhere classified: Secondary | ICD-10-CM

## 2019-05-04 NOTE — Therapy (Signed)
[facility] [address]  [place], [place], [address] Phone: [phone]   Fax:  [phone]  Physical Therapy Discharge Summary  Patient Details  Name: Terry [name] MRN: [id number] Date of Birth: 1950-10-08 Referring Provider (PT): Mechele Claude, PA-C   Progress Note Reporting Period 04/13/19 to 05/04/19  See note below for Objective Data and Assessment of Progress/Goals.    Encounter Date: 05/04/2019  PT End of Session - 05/04/19 0844    Visit Number  25    Number of Visits  28    Date for PT Re-Evaluation  05/04/19    Authorization Type  Medicare ([place]) & BCBS    PT Start Time  [phone]    PT Stop Time  [phone]    PT Time Calculation (min)  42 min    Activity Tolerance  Patient tolerated treatment well    Behavior During Therapy  [facility] for tasks assessed/performed       Past Medical History:  Diagnosis Date  . Arthritis    neck, back, knees, right ankle  . Bladder calculus   . Chronic back pain   . Complication of anesthesia    dizziness and "crazy amount of bile"  . GERD (gastroesophageal reflux disease)   . History of colon polyps    benign  . History of prostate cancer urologist-- dr Alinda Money   dx 2014---  s/p  prostatectomy 05-22-2013 ,  Gleason 3+3  . Hypercholesterolemia    takes Crestor daily  . Hyperlipidemia   . Wears glasses     Past Surgical History:  Procedure Laterality Date  . ACHILLES TENDON SURGERY Right 06-07-2018   '@[facility]'    reconstruction  . ANTERIOR CERVICAL DECOMP/DISCECTOMY FUSION N/A 06/27/2014   Procedure: ANTERIOR CERVICAL DECOMPRESSION/DISCECTOMY FUSION C5-C7   (2 LEVELS);  Surgeon: Melina Schools, MD;  Location: [place];  Service: Orthopedics;  Laterality: N/A;  . CARDIAC CATHETERIZATION  2004  . COLONOSCOPY    . CYSTOSCOPY WITH LITHOLAPAXY N/A 03/27/2019   Procedure: CYSTOSCOPY WITH REMOVAL OF BLADDER STONE AND FOREIGN BODY;  Surgeon: Raynelle Bring, MD;  Location: [facility];  Service: Urology;  Laterality: N/A;  . EYE SURGERY     lazy eye as child, lasik surgery  . FINGER SURGERY Right    4th finger  . I&D EXTREMITY Right 07/10/2018   Procedure: IRRIGATION AND DEBRIDEMENT RIGHT ANKLE WOUND AND WOUND VAC PLACEMENT;  Surgeon: Wylene Simmer, MD;  Location: [place];  Service: Orthopedics;  Laterality: Right;  . KNEE ARTHROSCOPY Bilateral right ?;  left 05-25-2007  '@[facility]'   . POSTERIOR LAMINECTOMY / DECOMPRESSION LUMBAR SPINE  08-31-2016    dr elsner  '@[facility]'   . PROSTATE BIOPSY  04/13/13   gleason 3+3=6, vol 55.4 cc  . ROBOT ASSISTED LAPAROSCOPIC RADICAL PROSTATECTOMY N/A 05/22/2013   Procedure: ROBOTIC ASSISTED LAPAROSCOPIC RADICAL PROSTATECTOMY LEVEL 1;  Surgeon: Dutch Gray, MD;  [facility];  Service: Urology;  Laterality: N/A;  . TONSILLECTOMY      There were no vitals filed for this visit.  Subjective Assessment - 05/04/19 0751    Subjective  is currently having pain in his LB. Has been referred to PT for back pain. Patient reporting 75% improvement in R heel since initial eval- has improved in strength, stamina, and "working out the scar tissue".    Pertinent History  prostate CA, HLD, hay fever, GERD, chronic back pain, asthma, R achilles tendon repair, B knee arthroscopy,R finger surgery, cardiac cath  2004, ACDF 2016    Diagnostic tests  none    Patient Stated Goals  i want to be able to walk 3 miles    Currently in Pain?  Yes    Pain Score  2     Pain Location  Heel    Pain Orientation  Right    Pain Descriptors / Indicators  --   "shin splint"   Pain Type  Chronic pain    Pain Score  6    Pain Location  Back    Pain Orientation  Lower;Right    Pain Descriptors / Indicators  Sharp    Pain Type  Chronic pain         [facility] PT Assessment - 05/04/19 0001      Observation/Other Assessments   Focus on Therapeutic Outcomes (FOTO)   57% (43% limitation, 39% predicted)       AROM   Right Ankle Dorsiflexion  18    Right Ankle Plantar  Flexion  41    Right Ankle Inversion  26    Right Ankle Eversion  18      Strength   Right Hip Flexion  4+/5    Right Hip ABduction  4+/5    Right Hip ADduction  4+/5    Left Hip Flexion  4+/5    Left Hip ABduction  4+/5    Left Hip ADduction  4+/5    Right Knee Flexion  5/5    Right Knee Extension  4+/5    Left Knee Flexion  4+/5    Left Knee Extension  4+/5    Right Ankle Dorsiflexion  4+/5    Right Ankle Plantar Flexion  2+/5    Right Ankle Inversion  4+/5    Right Ankle Eversion  4/5    Left Ankle Dorsiflexion  4+/5    Left Ankle Plantar Flexion  2+/5    Left Ankle Inversion  4+/5    Left Ankle Eversion  4/5      Balance   Balance Assessed  Yes      Static Standing Balance   Static Standing Balance -  Activities   Single Leg Stance - Right Leg    Static Standing - Comment/# of Minutes  7 sec without LOB                   [facility] Adult PT Treatment/Exercise - 05/04/19 0001      Ankle Exercises: Aerobic   Recumbent Bike  L2 x 6 min      Ankle Exercises: Stretches   Gastroc Stretch  2 reps;30 seconds   R LE at TM rail   Other Stretch  R ankle plantarflexion stretch in standing at TM rail 2x30" to tolerance      Ankle Exercises: Seated   Other Seated Ankle Exercises  R 4 way ankle x15 each with green TB             PT Education - 05/04/19 0843    Education Details  consolidation and review of HEP; discussion on objective progress and remaining impairments    Person(s) Educated  Patient    Methods  Explanation;Demonstration;Tactile cues;Verbal cues;Handout    Comprehension  Verbalized understanding;Returned demonstration       PT Short Term Goals - 05/04/19 0801      PT SHORT TERM GOAL #1   Title  Patient to be independent with initial HEP.    Time  3    Period  Weeks  Status  Achieved    Target Date  02/08/19        PT Long Term Goals - 05/04/19 0801      PT LONG TERM GOAL #1   Title  Patient to be independent with advanced HEP.     Time  7    Period  Weeks    Status  Achieved   met for current     PT LONG TERM GOAL #2   Title  Patient to demonstrate R ankle AROM WFL and without pain limiting.    Time  7    Period  Weeks    Status  Partially Met   improved R ankle dorsiflexion, plantarflexion, and inversion     PT LONG TERM GOAL #3   Title  Patient to demonstrate B LE strength >=4+/5.    Time  7    Period  Weeks    Status  Partially Met   improvement in R hip flexion, B hip adduction, R knee flexion, and R ankle inversion and eversion     PT LONG TERM GOAL #4   Title  Patient to demonstrate R SLS for 30 sec without LOB.    Time  7    Period  Weeks    Status  Partially Met   able to perform 7 sec without LOB on R LE     PT LONG TERM GOAL #5   Title  Patient to report return to golf with modifications as needed and no pain.    Time  7    Period  Weeks    Status  Unable to assess   reports that LBP is moreso limiting him in participating in golf that foot           Plan - 05/04/19 0851    Clinical Impression Statement  Patient reporting 75% improvement in R heel since initial eval. Notes that he has demonstrated improvement in strength, stamina, and working on the soft tissue restriction in his lower leg since starting PT. Patient has been limited in participating in golf d/t worsening of his LBP, hence additional referral to PT received to address this pain in the future. Patient reports consistency with HEP at home. Has demonstrated improvement in R ankle dorsiflexion, plantarflexion, and inversion AROM. Strength testing revealed improvement in R hip flexion, B hip adduction, R knee flexion, and R ankle inversion and eversion strength. B plantarflexion strength still most limiting factor, with patient still struggling to perform single leg heel raise against gravity. R LE SLS has improved as well, with patient reaching 7 sec without LOB today. Reviewed and consolidated HEP with patient's input for max  benefit. Patient reported understanding of all edu provided today and without complaints at end of session. Patient has demonstrated progress towards his goals thus far and is independent with HEP. Ready for D/C at this with transition to home program.    Comorbidities  prostate CA, HLD, hay fever, GERD, chronic back pain, asthma, R achilles tendon repair, B knee arthroscopy,R finger surgery, cardiac cath 2004, ACDF 2016    Rehab Potential  Good    PT Treatment/Interventions  ADLs/Self Care Home Management;Cryotherapy;Electrical Stimulation;Iontophoresis 65m/ml Dexamethasone;Moist Heat;Balance training;Therapeutic exercise;Therapeutic activities;Functional mobility training;Stair training;Gait training;Ultrasound;Neuromuscular re-education;Patient/family education;Manual techniques;Vasopneumatic Device;Taping;Energy conservation;Dry needling;Passive range of motion;Scar mobilization    PT Next Visit Plan  DC at this time    Consulted and Agree with Plan of Care  Patient       Patient will benefit from skilled therapeutic  intervention in order to improve the following deficits and impairments:  Increased edema, Decreased scar mobility, Decreased activity tolerance, Decreased strength, Pain, Increased fascial restricitons, Increased muscle spasms, Difficulty walking, Decreased balance, Decreased range of motion, Improper body mechanics, Postural dysfunction, Impaired flexibility  Visit Diagnosis: Pain in right ankle and joints of right foot  Stiffness of right ankle, not elsewhere classified  Muscle weakness (generalized)  Difficulty in walking, not elsewhere classified     Problem List Patient Active Problem List   Diagnosis Date Noted  . Infection, skin 07/26/2018  . Achilles tendon infection 07/26/2018  . Wound dehiscence, surgical 07/10/2018    Class: Acute  . Wound dehiscence, surgical, initial encounter 07/10/2018  . Spondylolisthesis of lumbar region 08/31/2016  . Neck pain  06/27/2014  . Prostate cancer ([place])      PHYSICAL THERAPY DISCHARGE SUMMARY  Visits from Start of Care: 25  Current functional level related to goals / functional outcomes: See above clinical impression   Remaining deficits: Decreased ankle ROM, strength, balance   Education / Equipment: HEP  Plan: Patient agrees to discharge.  Patient goals were partially met. Patient is being discharged due to being pleased with the current functional level.  ?????     Janene Harvey, PT, DPT 05/04/19 8:54 AM    [facility] [address]  [place], [place], [address] Phone: [phone]   Fax:  [phone]  Name: Terry [name] MRN: [id number] Date of Birth: Feb 05, 1951

## 2019-05-11 ENCOUNTER — Ambulatory Visit: Payer: Medicare Other | Attending: Neurological Surgery | Admitting: Physical Therapy

## 2019-05-11 ENCOUNTER — Other Ambulatory Visit: Payer: Self-pay

## 2019-05-11 DIAGNOSIS — M62830 Muscle spasm of back: Secondary | ICD-10-CM | POA: Diagnosis present

## 2019-05-11 DIAGNOSIS — M5441 Lumbago with sciatica, right side: Secondary | ICD-10-CM | POA: Diagnosis not present

## 2019-05-11 DIAGNOSIS — G8929 Other chronic pain: Secondary | ICD-10-CM | POA: Diagnosis present

## 2019-05-11 DIAGNOSIS — M5417 Radiculopathy, lumbosacral region: Secondary | ICD-10-CM | POA: Diagnosis present

## 2019-05-11 DIAGNOSIS — R252 Cramp and spasm: Secondary | ICD-10-CM | POA: Diagnosis present

## 2019-05-11 NOTE — Patient Instructions (Signed)
    Home exercise program created by Siler Mavis, PT.  For questions, please contact Lynell Greenhouse via phone at [phone] or email at [email or web address]  [facility] [place] [address]  [address] [place], [place], [address] Phone: [phone]   Fax:  [phone]    

## 2019-05-11 NOTE — Therapy (Signed)
[facility] [address]  [place], [place], [address] Phone: [phone]   Fax:  [phone]  Physical Therapy Evaluation  Patient Details  Name: Terry [name] MRN: [id number] Date of Birth: Jan 31, 1951 Referring Provider (PT): Earleen Newport, MD   Encounter Date: 05/11/2019  PT End of Session - 05/11/19 1107    Visit Number  1    Number of Visits  12    Date for PT Re-Evaluation  06/22/19    Authorization Type  Medicare ([place]X through 06/01/19) & BCBS    PT Start Time  1107    PT Stop Time  1207    PT Time Calculation (min)  60 min    Activity Tolerance  Patient tolerated treatment well    Behavior During Therapy  [facility] for tasks assessed/performed       Past Medical History:  Diagnosis Date  . Arthritis    neck, back, knees, right ankle  . Bladder calculus   . Chronic back pain   . Complication of anesthesia    dizziness and "crazy amount of bile"  . GERD (gastroesophageal reflux disease)   . History of colon polyps    benign  . History of prostate cancer urologist-- dr Alinda Money   dx 2014---  s/p  prostatectomy 05-22-2013 ,  Gleason 3+3  . Hypercholesterolemia    takes Crestor daily  . Hyperlipidemia   . Wears glasses     Past Surgical History:  Procedure Laterality Date  . ACHILLES TENDON SURGERY Right 06-07-2018   @[facility]    reconstruction  . ANTERIOR CERVICAL DECOMP/DISCECTOMY FUSION N/A 06/27/2014   Procedure: ANTERIOR CERVICAL DECOMPRESSION/DISCECTOMY FUSION C5-C7   (2 LEVELS);  Surgeon: Melina Schools, MD;  Location: [facility];  Service: Orthopedics;  Laterality: N/A;  . CARDIAC CATHETERIZATION  2004  . COLONOSCOPY    . CYSTOSCOPY WITH LITHOLAPAXY N/A 03/27/2019   Procedure: CYSTOSCOPY WITH REMOVAL OF BLADDER STONE AND FOREIGN BODY;  Surgeon: Raynelle Bring, MD;  Location: [facility];  Service: Urology;  Laterality: N/A;  . EYE SURGERY     lazy eye as child, lasik surgery  . FINGER  SURGERY Right    4th finger  . I&D EXTREMITY Right 07/10/2018   Procedure: IRRIGATION AND DEBRIDEMENT RIGHT ANKLE WOUND AND WOUND VAC PLACEMENT;  Surgeon: Wylene Simmer, MD;  Location: [facility];  Service: Orthopedics;  Laterality: Right;  . KNEE ARTHROSCOPY Bilateral right ?;  left 05-25-2007  @[facility]   . POSTERIOR LAMINECTOMY / DECOMPRESSION LUMBAR SPINE  08-31-2016    dr elsner  @[facility]   . PROSTATE BIOPSY  04/13/13   gleason 3+3=6, vol 55.4 cc  . ROBOT ASSISTED LAPAROSCOPIC RADICAL PROSTATECTOMY N/A 05/22/2013   Procedure: ROBOTIC ASSISTED LAPAROSCOPIC RADICAL PROSTATECTOMY LEVEL 1;  Surgeon: Dutch Gray, MD;  Location: [facility];  Service: Urology;  Laterality: N/A;  . TONSILLECTOMY      There were no vitals filed for this visit.   Subjective Assessment - 05/11/19 1111    Subjective  Pt reporting history of L3-4 back fusion ~2.5 yrs ago but now needing to have fusion extended surperiorly & inferiorly with surgrey tenatively in early 2021. Pain has been getting worse for past several months with shots back in June 2020 which seemed to help at the time. More recent injections actually seem to have made pain worse.    Pertinent History  prostate CA, HLD, hay fever, GERD, chronic back pain s/p L3-4 fusion 2018, asthma, R achilles tendon  repair, B knee arthroscopy,R finger surgery, cardiac cath 2004, ACDF 2016    Limitations  Sitting;Standing;Walking;House hold activities    How long can you sit comfortably?  45 minutes    How long can you stand comfortably?  45 minutes    How long can you walk comfortably?  2/3 mile    Diagnostic tests  no recent imaging available    Patient Stated Goals  "fix my back"    Currently in Pain?  Yes    Pain Score  6    up to 8/10 at worst   Pain Location  Back    Pain Orientation  Lower;Right    Pain Descriptors / Indicators  Sharp   "pain in the ass"   Pain Type  Chronic pain    Pain Radiating Towards  pain & tingling down lateral thigh    Pain Onset  More than a month  ago    Pain Frequency  Constant    Aggravating Factors   walking around the block, prolonged sitting    Pain Relieving Factors  prescription meds, heating pad > ice    Effect of Pain on Daily Activities  alters how he gets in/out of car, sleep disturbance    Pain Score  3    Pain Location  Heel    Pain Orientation  Right    Pain Descriptors / Indicators  Constant   "it hurts"        [facility] PT Assessment - 05/11/19 1107      Assessment   Medical Diagnosis  R lumbar radiculopathy    Referring Provider (PT)  Earleen Newport, MD    Onset Date/Surgical Date  --   several years, exacerbation over past 6+ months   Hand Dominance  Right    Next MD Visit  05/15/19 for injections    Prior Therapy  yes      Balance Screen   Has the patient fallen in the past 6 months  No    Has the patient had a decrease in activity level because of a fear of falling?   No    Is the patient reluctant to leave their home because of a fear of falling?   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      Cognition   Overall Cognitive Status  Within Functional Limits for tasks assessed      Observation/Other Assessments   Focus on Therapeutic Outcomes (FOTO)   Lumbar - 47% (53% limitation); Predicted 55% (45% limitation)      ROM / Strength   AROM / PROM / Strength  AROM;Strength      AROM   AROM Assessment Site  Lumbar    Lumbar Flexion  fingertips to toes    Lumbar Extension  20% limited    Lumbar - Right Side Bend  hand to lateral joint line of knee    Lumbar - Left Side Bend  hand to lateral joint line  of knee    Lumbar  - Right Rotation  WFL    Lumbar - Left Rotation  [facility]      Strength   Right Hip Flexion  4+/5    Right Hip ABduction  4+/5    Right Hip ADduction  4+/5    Left Hip Flexion  4+/5    Left Hip ABduction  4+/5    Left Hip ADduction  4+/5    Right Knee Flexion  5/5    Right Knee Extension  4+/5    Left Knee Flexion  4+/5    Left Knee Extension  4+/5    Right Ankle Dorsiflexion  4+/5    Right Ankle Plantar Flexion  2+/5    Right Ankle Inversion  4+/5    Right Ankle Eversion  4/5    Left Ankle Dorsiflexion  4+/5    Left Ankle Plantar Flexion  2+/5    Left Ankle Inversion  4+/5    Left Ankle Eversion  4/5      Flexibility   Soft Tissue Assessment /Muscle Length  yes    Hamstrings  mild tight R>L    Quadriceps  mod tight B    ITB  mod tight B    Piriformis  mild mod tight B      Palpation   Palpation comment  ttp with increased muscle tension and taut bands t/o lumbar paraspinals, glutes, pirifformis, quad and HS (R>L)                Objective measurements completed on examination: See above findings.      [facility] PT Treatment/Exercise - 05/11/19 1107      Exercises   Exercises  Lumbar      Lumbar Exercises: Stretches   Passive Hamstring Stretch  Right;30 seconds;3 reps   pt instructed to perform B at home   Passive Hamstring Stretch Limitations  supine with strap (limited tolerance d/t repeated muscle cramping in quads) & seated hip hinge (cues to avoid rounding back)    Quad Stretch  Right;30 seconds;2 reps   pt instructed to perform B at home   Quad Stretch Limitations  prone with strap    Piriformis Stretch  Right;30 seconds;2 reps   pt instructed to perform B at home   Piriformis Stretch Limitations  supine KTOS             PT Education - 05/11/19 1207    Education Details  PT eval findings, anticipated POC & initial HEP    Person(s) Educated  Patient    Methods  Explanation;Demonstration;Verbal cues;Tactile cues;Handout    Comprehension   Verbalized understanding;Returned demonstration;Verbal cues required;Tactile cues required;Need further instruction       PT Short Term Goals - 05/11/19 1207      PT SHORT TERM GOAL #1   Title  Patient to be independent with initial HEP.    Status  New    Target Date  06/01/19      PT SHORT TERM GOAL #2   Title  Patient will verbalize/demonstrate understanding of neutral spine posture and proper body mechanics to reduce strain on lumbar spine.    Status  New    Target Date  06/01/19        PT Long Term Goals - 05/11/19 1207      PT LONG TERM GOAL #1   Title  Patient to be independent with advanced/ongoing HEP.    Status  New    Target Date  06/22/19      PT LONG TERM GOAL #2   Title  Patient to demonstrate appropriate posture and body mechanics needed for daily activities    Status  New    Target Date  06/22/19      PT LONG TERM GOAL #3   Title  Patient to demonstrate improved tissue quality and pliability with reduced pain.    Status  New    Target Date  06/22/19      PT LONG TERM GOAL #4   Title  Patient to report pain reduction in frequency and intensity by >/= 50%.    Status  New    Target Date  06/22/19      PT LONG TERM GOAL #5   Title  Patient will report minimal to no sleep disturbance due to pain or muscle cramping.    Status  New    Target Date  06/22/19             Plan - 05/11/19 1207    Clinical Impression Statement  Terry [name] is a 68 y/o male who presents to OP PT for acute on chronic LBP with R lumbar radiculopathy. History remarkable for L3-4 decompressive laminectomy decompression of L3 and L4 nerve roots, posterior lumbar interbody arthrodesis, pedicle screw fixation L3-4 and posterior lateral arthrodesis L3-4 on 08/31/16. Patient reports surgeon now indicating need for superior and inferior extension of the existing fusion with surgery anticipated for early 2021 (February or March). Patient reports prior relief from [facility] back in June 2020 but more  recent injections ~2 weeks ago seem to have actually made pain worse. He is scheduled for another round of injections next week on 05/15/19. Pain currently 6/10 centered near R SIJ with radicular pain and tingling into R lateral thigh and frequent cramping in quads and hamstrings. Assessment revealing ttp with increased muscle tension throughout B lumbar paraspinals, glutes/piriformis, quads and hamstrings (R>L) with decreased proximal LE flexibility although only very mildly limited lumbar ROM. Core and mild proximal LE weakness also present. Pain and muscle cramping interfere with sleep along with positional and activity tolerance. [place] will benefit from skilled PT services to address aforementioned impairments and allow for increased participation in desired activities with decreased pain interference.    Personal Factors and Comorbidities  Age;Comorbidity 3+;Time since onset of injury/illness/exacerbation;Fitness;Past/Current Experience    Comorbidities  prostate CA, HLD, hay fever, GERD, chronic back pain s/p L3-4 fusion, asthma, R achilles tendon repair, B knee arthroscopy,R finger surgery, cardiac cath 2004, ACDF 2016    Examination-Activity Limitations  Sit;Bend;Squat;Caring for Others;Stairs;Carry;Stand;Transfers;Lift;Locomotion Level;Reach Overhead    Examination-Participation Restrictions  Church;Cleaning;Shop;Community Activity;Volunteer;Driving;Yard Work;Interpersonal Relationship;Laundry;Meal Prep    Stability/Clinical Decision Making  Evolving/Moderate complexity    Clinical Decision Making  Moderate    Rehab Potential  Good    PT Frequency  2x / week    PT Duration  6 weeks    PT Treatment/Interventions  ADLs/Self Care Home Management;Cryotherapy;Electrical Stimulation;Iontophoresis 4mg /ml Dexamethasone;Moist Heat;Balance training;Therapeutic exercise;Therapeutic activities;Functional mobility training;Stair training;Gait training;Ultrasound;Neuromuscular re-education;Patient/family  education;Manual techniques;Vasopneumatic Device;Taping;Energy conservation;Dry needling;Passive range of motion;Scar mobilization    PT Next Visit Plan  Review initial HEP; proximal LE flexibility with manual STM/MFR and possible DN to address abnormal muscle tightness; lumbopelvic/core stabilization & strengthening; modalities PRN    PT Home Exercise Plan  05/11/19 - quad, HS & pirformis stretches, self-STM with rolling pin    Consulted and Agree with Plan of Care  Patient       Patient will benefit from skilled  therapeutic intervention in order to improve the following deficits and impairments:  Decreased activity tolerance, Decreased endurance, Decreased mobility, Decreased range of motion, Decreased strength, Difficulty walking, Increased fascial restricitons, Increased muscle spasms, Impaired perceived functional ability, Impaired flexibility, Impaired sensation, Improper body mechanics, Postural dysfunction, Pain  Visit Diagnosis: Chronic bilateral low back pain with right-sided sciatica  Radiculopathy, lumbosacral region  Muscle spasm of back  Cramp and spasm     Problem List Patient Active Problem List   Diagnosis Date Noted  . Infection, skin 07/26/2018  . Achilles tendon infection 07/26/2018  . Wound dehiscence, surgical 07/10/2018    Class: Acute  . Wound dehiscence, surgical, initial encounter 07/10/2018  . Spondylolisthesis of lumbar region 08/31/2016  . Neck pain 06/27/2014  . Prostate cancer [facility])     Percival Spanish, PT, MPT 05/11/2019, 1:23 PM  [facility] [address]  [place], [place], [address] Phone: [phone]   Fax:  [phone]  Name: Terry [name] MRN: [id number] Date of Birth: September 20, 1950

## 2019-05-15 DIAGNOSIS — Z981 Arthrodesis status: Secondary | ICD-10-CM | POA: Diagnosis not present

## 2019-05-15 DIAGNOSIS — M5136 Other intervertebral disc degeneration, lumbar region: Secondary | ICD-10-CM | POA: Diagnosis not present

## 2019-05-18 ENCOUNTER — Other Ambulatory Visit: Payer: Self-pay

## 2019-05-18 ENCOUNTER — Encounter: Payer: Self-pay | Admitting: Physical Therapy

## 2019-05-18 ENCOUNTER — Ambulatory Visit: Payer: Medicare Other | Admitting: Physical Therapy

## 2019-05-18 DIAGNOSIS — M5417 Radiculopathy, lumbosacral region: Secondary | ICD-10-CM

## 2019-05-18 DIAGNOSIS — G8929 Other chronic pain: Secondary | ICD-10-CM

## 2019-05-18 DIAGNOSIS — M5441 Lumbago with sciatica, right side: Secondary | ICD-10-CM

## 2019-05-18 DIAGNOSIS — M62830 Muscle spasm of back: Secondary | ICD-10-CM

## 2019-05-18 DIAGNOSIS — R252 Cramp and spasm: Secondary | ICD-10-CM

## 2019-05-18 NOTE — Therapy (Signed)
[facility] [address]  [place], [place], [address] Phone: [phone]   Fax:  [phone]  Physical Therapy Treatment  Patient Details  Name: Terry [name] MRN: [id number] Date of Birth: 09/26/1950 Referring Provider (PT): Earleen Newport, MD   Encounter Date: 05/18/2019  PT End of Session - 05/18/19 0938    Visit Number  2    Number of Visits  12    Date for PT Re-Evaluation  06/22/19    Authorization Type  Medicare ([place]X through 06/01/19) & BCBS    PT Start Time  [phone]    PT Stop Time  1032    PT Time Calculation (min)  54 min    Activity Tolerance  Patient tolerated treatment well    Behavior During Therapy  [facility] for tasks assessed/performed       Past Medical History:  Diagnosis Date  . Arthritis    neck, back, knees, right ankle  . Bladder calculus   . Chronic back pain   . Complication of anesthesia    dizziness and "crazy amount of bile"  . GERD (gastroesophageal reflux disease)   . History of colon polyps    benign  . History of prostate cancer urologist-- dr Alinda Money   dx 2014---  s/p  prostatectomy 05-22-2013 ,  Gleason 3+3  . Hypercholesterolemia    takes Crestor daily  . Hyperlipidemia   . Wears glasses     Past Surgical History:  Procedure Laterality Date  . ACHILLES TENDON SURGERY Right 06-07-2018   @[facility]    reconstruction  . ANTERIOR CERVICAL DECOMP/DISCECTOMY FUSION N/A 06/27/2014   Procedure: ANTERIOR CERVICAL DECOMPRESSION/DISCECTOMY FUSION C5-C7   (2 LEVELS);  Surgeon: Melina Schools, MD;  Location: [facility];  Service: Orthopedics;  Laterality: N/A;  . CARDIAC CATHETERIZATION  2004  . COLONOSCOPY    . CYSTOSCOPY WITH LITHOLAPAXY N/A 03/27/2019   Procedure: CYSTOSCOPY WITH REMOVAL OF BLADDER STONE AND FOREIGN BODY;  Surgeon: Raynelle Bring, MD;  Location[facility];  Service: Urology;  Laterality: N/A;  . EYE SURGERY     lazy eye as child, lasik surgery  . FINGER  SURGERY Right    4th finger  . I & D EXTREMITY Right 07/10/2018   Procedure: IRRIGATION AND DEBRIDEMENT RIGHT ANKLE WOUND AND WOUND VAC PLACEMENT;  Surgeon: Wylene Simmer, MD;  Location: [facility];  Service: Orthopedics;  Laterality: Right;  . KNEE ARTHROSCOPY Bilateral right ?;  left 05-25-2007  @[facility]   . POSTERIOR LAMINECTOMY / DECOMPRESSION LUMBAR SPINE  08-31-2016    dr elsner  @[facility]   . PROSTATE BIOPSY  04/13/13   gleason 3+3=6, vol 55.4 cc  . ROBOT ASSISTED LAPAROSCOPIC RADICAL PROSTATECTOMY N/A 05/22/2013   Procedure: ROBOTIC ASSISTED LAPAROSCOPIC RADICAL PROSTATECTOMY LEVEL 1;  Surgeon: Dutch Gray, MD;  Location: [facility];  Service: Urology;  Laterality: N/A;  . TONSILLECTOMY      There were no vitals filed for this visit.  Subjective Assessment - 05/18/19 0941    Subjective  Pt reporting he has received 4 more injections since last PT visit and he is noting some benefit from these.    Pertinent History  prostate CA, HLD, hay fever, GERD, chronic back pain s/p L3-4 fusion 2018, asthma, R achilles tendon repair, B knee arthroscopy,R finger surgery, cardiac cath 2004, ACDF 2016    Diagnostic tests  no recent imaging available    Patient Stated Goals  "fix my back"    Currently in Pain?  Yes    Pain Score  3    2-3/10   Pain Location  Back    Pain Orientation  Lower    Pain Descriptors / Indicators  Dull    Pain Type  Chronic pain    Pain Frequency  Constant                       [facility] Adult PT Treatment/Exercise - 05/18/19 0938      Exercises   Exercises  Lumbar      Lumbar Exercises: Stretches   Passive Hamstring Stretch  Right;Left;30 seconds;2 reps    Passive Hamstring Stretch Limitations  seated hip hinge - cues to keep back flat and avoid bouncing    Quad Stretch  Right;Left;30 seconds;2 reps    Quad Stretch Limitations  prone with strap    Piriformis Stretch  Right;Left;30 seconds;2 reps    Piriformis Stretch Limitations  supine KTOS    Other Lumbar Stretch  Exercise  --      Lumbar Exercises: Aerobic   Nustep  L4 x 6 min (LE only)      Lumbar Exercises: Supine   Ab Set  10 reps;5 seconds    Clam  10 reps;3 seconds    Clam Limitations  abd bracing + alt hip ABD/ER with green TB    Bent Knee Raise  10 reps;3 seconds    Bent Knee Raise Limitations  brace marching    Bridge  10 reps;5 seconds    Bridge Limitations  + green TB hip ABD isometric      Modalities   Modalities  Electrical Stimulation;Moist Heat      Moist Heat Therapy   Number Minutes Moist Heat  15 Minutes    Moist Heat Location  Lumbar Spine      Electrical Stimulation   Electrical Stimulation Location  Lumbar paraspinals    Electrical Stimulation Action  IFC    Electrical Stimulation Parameters  80-150 Hz, output intenisty to pt tolerance x 15'    Electrical Stimulation Goals  Pain      Manual Therapy   Manual Therapy  Other (comment)    Other Manual Therapy  Reviewed use of roller stick/rolling pin for self-STM to quads, HS, ITB & calf x ~60 sec per area               PT Short Term Goals - 05/18/19 0943      PT SHORT TERM GOAL #1   Title  Patient to be independent with initial HEP.    Status  On-going    Target Date  06/01/19      PT SHORT TERM GOAL #2   Title  Patient will verbalize/demonstrate understanding of neutral spine posture and proper body mechanics to reduce strain on lumbar spine.    Status  On-going    Target Date  06/01/19        PT Long Term Goals - 05/18/19 0944      PT LONG TERM GOAL #1   Title  Patient to be independent with advanced/ongoing HEP.    Status  On-going    Target Date  06/22/19      PT LONG TERM GOAL #2   Title  Patient to demonstrate appropriate posture and body mechanics needed for daily activities    Status  On-going    Target Date  06/22/19      PT LONG TERM GOAL #3   Title  Patient to demonstrate  improved tissue quality and pliability with reduced pain.    Status  On-going    Target Date  06/22/19       PT LONG TERM GOAL #4   Title  Patient to report pain reduction in frequency and intensity by >/= 50%.    Status  On-going    Target Date  06/22/19      PT LONG TERM GOAL #5   Title  Patient will report minimal to no sleep disturbance due to pain or muscle cramping.    Status  On-going    Target Date  06/22/19            Plan - 05/18/19 0945    Clinical Impression Statement  Matthew noting more benefit from latest round of injections but still notes constant LBP at 2-3/10 level. Initial HEP reviewed with patient requiring cueing to increased hold time on stretches and avoid bouncing during stretches to avoid triggering stretch reflex and allow for better muscle lengthening. Patient reporting better tolerance for stretches today with no cramping as observed during initial instruction. Introduced basic core/lumbopelvic stabilization and strengthening exercises with patient requiring cues to properly engage abdominal muscles but noting benefit from bridging exercise. Deferred HEP update today until patient able to provide better return demonstration but will hopefully add basic lumbopelvic strengthening exercises within next 1-2 visits. Treatment concluded with estim and moist heat to promote further pain reduction and muscle relaxation.    Personal Factors and Comorbidities  Age;Comorbidity 3+;Time since onset of injury/illness/exacerbation;Fitness;Past/Current Experience    Comorbidities  prostate CA, HLD, hay fever, GERD, chronic back pain s/p L3-4 fusion, asthma, R achilles tendon repair, B knee arthroscopy,R finger surgery, cardiac cath 2004, ACDF 2016    Examination-Activity Limitations  Sit;Bend;Squat;Caring for Others;Stairs;Carry;Stand;Transfers;Lift;Locomotion Level;Reach Overhead    Examination-Participation Restrictions  Church;Cleaning;Shop;Community Activity;Volunteer;Driving;Yard Work;Interpersonal Relationship;Laundry;Meal Prep    Rehab Potential  Good    PT Frequency  2x / week     PT Duration  6 weeks    PT Treatment/Interventions  ADLs/Self Care Home Management;Cryotherapy;Electrical Stimulation;Iontophoresis 4mg /ml Dexamethasone;Moist Heat;Balance training;Therapeutic exercise;Therapeutic activities;Functional mobility training;Stair training;Gait training;Ultrasound;Neuromuscular re-education;Patient/family education;Manual techniques;Vasopneumatic Device;Taping;Energy conservation;Dry needling;Passive range of motion;Scar mobilization    PT Next Visit Plan  proximal LE flexibility with manual STM/MFR and possible DN to address abnormal muscle tightness; lumbopelvic/core stabilization & strengthening; modalities PRN    PT Home Exercise Plan  05/11/19 - quad, HS & pirformis stretches, self-STM with rolling pin    Consulted and Agree with Plan of Care  Patient       Patient will benefit from skilled therapeutic intervention in order to improve the following deficits and impairments:  Decreased activity tolerance, Decreased endurance, Decreased mobility, Decreased range of motion, Decreased strength, Difficulty walking, Increased fascial restricitons, Increased muscle spasms, Impaired perceived functional ability, Impaired flexibility, Impaired sensation, Improper body mechanics, Postural dysfunction, Pain  Visit Diagnosis: Chronic bilateral low back pain with right-sided sciatica  Radiculopathy, lumbosacral region  Muscle spasm of back  Cramp and spasm     Problem List Patient Active Problem List   Diagnosis Date Noted  . Infection, skin 07/26/2018  . Achilles tendon infection 07/26/2018  . Wound dehiscence, surgical 07/10/2018    Class: Acute  . Wound dehiscence, surgical, initial encounter 07/10/2018  . Spondylolisthesis of lumbar region 08/31/2016  . Neck pain 06/27/2014  . Prostate cancer [facility])     Percival Spanish, PT, MPT 05/18/2019, 6:43 PM  [facility] [address]  [address]  [place], [place], [address] Phone: [phone]   Fax:  [phone]  Name: KELVYN TOELLE MRN: [id number] Date of Birth: Dec 14, 1950

## 2019-05-22 ENCOUNTER — Ambulatory Visit: Payer: Medicare Other

## 2019-05-22 ENCOUNTER — Other Ambulatory Visit: Payer: Self-pay

## 2019-05-22 DIAGNOSIS — M25571 Pain in right ankle and joints of right foot: Secondary | ICD-10-CM | POA: Diagnosis not present

## 2019-05-22 DIAGNOSIS — G8929 Other chronic pain: Secondary | ICD-10-CM

## 2019-05-22 DIAGNOSIS — M62830 Muscle spasm of back: Secondary | ICD-10-CM

## 2019-05-22 DIAGNOSIS — M5417 Radiculopathy, lumbosacral region: Secondary | ICD-10-CM

## 2019-05-22 DIAGNOSIS — M5441 Lumbago with sciatica, right side: Secondary | ICD-10-CM

## 2019-05-22 DIAGNOSIS — R252 Cramp and spasm: Secondary | ICD-10-CM

## 2019-05-22 NOTE — Therapy (Signed)
[facility] [address]  [place], [place], [address] Phone: [phone]   Fax:  [phone]  Physical Therapy Treatment  Patient Details  Name: Terry [name] MRN: [id number] Date of Birth: Jul 18, 1950 Referring Provider (PT): Earleen Newport, MD   Encounter Date: 05/22/2019  PT End of Session - 05/22/19 0806    Visit Number  3    Number of Visits  12    Date for PT Re-Evaluation  06/22/19    Authorization Type  Medicare (KX through 06/01/19) & BCBS    PT Start Time  0801    PT Stop Time  0901    PT Time Calculation (min)  60 min    Activity Tolerance  Patient tolerated treatment well    Behavior During Therapy  [facility] for tasks assessed/performed       Past Medical History:  Diagnosis Date  . Arthritis    neck, back, knees, right ankle  . Bladder calculus   . Chronic back pain   . Complication of anesthesia    dizziness and "crazy amount of bile"  . GERD (gastroesophageal reflux disease)   . History of colon polyps    benign  . History of prostate cancer urologist-- dr Alinda Money   dx 2014---  s/p  prostatectomy 05-22-2013 ,  Gleason 3+3  . Hypercholesterolemia    takes Crestor daily  . Hyperlipidemia   . Wears glasses     Past Surgical History:  Procedure Laterality Date  . ACHILLES TENDON SURGERY Right 06-07-2018   @[facility]    reconstruction  . ANTERIOR CERVICAL DECOMP/DISCECTOMY FUSION N/A 06/27/2014   Procedure: ANTERIOR CERVICAL DECOMPRESSION/DISCECTOMY FUSION C5-C7   (2 LEVELS);  Surgeon: Melina Schools, MD;  Location: [place];  Service: Orthopedics;  Laterality: N/A;  . CARDIAC CATHETERIZATION  2004  . COLONOSCOPY    . CYSTOSCOPY WITH LITHOLAPAXY N/A 03/27/2019   Procedure: CYSTOSCOPY WITH REMOVAL OF BLADDER STONE AND FOREIGN BODY;  Surgeon: Raynelle Bring, MD;  Location: [facility];  Service: Urology;  Laterality: N/A;  . EYE SURGERY     lazy eye as child, lasik surgery  . FINGER  SURGERY Right    4th finger  . I & D EXTREMITY Right 07/10/2018   Procedure: IRRIGATION AND DEBRIDEMENT RIGHT ANKLE WOUND AND WOUND VAC PLACEMENT;  Surgeon: Wylene Simmer, MD;  Location: [place];  Service: Orthopedics;  Laterality: Right;  . KNEE ARTHROSCOPY Bilateral right ?;  left 05-25-2007  @[facility]   . POSTERIOR LAMINECTOMY / DECOMPRESSION LUMBAR SPINE  08-31-2016    dr elsner  @[facility]   . PROSTATE BIOPSY  04/13/13   gleason 3+3=6, vol 55.4 cc  . ROBOT ASSISTED LAPAROSCOPIC RADICAL PROSTATECTOMY N/A 05/22/2013   Procedure: ROBOTIC ASSISTED LAPAROSCOPIC RADICAL PROSTATECTOMY LEVEL 1;  Surgeon: Dutch Gray, MD;  Location: [facility];  Service: Urology;  Laterality: N/A;  . TONSILLECTOMY      There were no vitals filed for this visit.  Subjective Assessment - 05/22/19 0804    Subjective  Has bee performing home stretches.  Having more pain with prolonged driving.    Pertinent History  prostate CA, HLD, hay fever, GERD, chronic back pain s/p L3-4 fusion 2018, asthma, R achilles tendon repair, B knee arthroscopy,R finger surgery, cardiac cath 2004, ACDF 2016    Diagnostic tests  no recent imaging available    Patient Stated Goals  "fix my back"    Currently in Pain?  No/denies    Pain Score  0-No pain   up to 5/10 when driving   Pain Location  Back    Pain Orientation  Lower    Pain Descriptors / Indicators  Dull    Pain Radiating Towards  Pain and tingling down R lateral thigh    Pain Onset  More than a month ago    Pain Frequency  Intermittent    Aggravating Factors   prolonged driving    Pain Relieving Factors  pain meds, heating pads    Multiple Pain Sites  Yes    Pain Score  2    Pain Location  Ankle    Pain Orientation  Right;Medial    Pain Descriptors / Indicators  Constant    Pain Type  Chronic pain                       [facility] Adult PT Treatment/Exercise - 05/22/19 0001      Lumbar Exercises: Stretches   Passive Hamstring Stretch  Right;Left;30 seconds;2 reps     Passive Hamstring Stretch Limitations  seated hip hinge - cues to keep back flat and avoid bouncing    Figure 4 Stretch  1 rep;30 seconds    Figure 4 Stretch Limitations  B;  sitting       Lumbar Exercises: Aerobic   Nustep  L4 x 7 min (LE only)      Lumbar Exercises: Seated   Sit to Stand  10 reps    Sit to Stand Limitations  + abd. bracing       Lumbar Exercises: Supine   Clam  3 seconds   x 12 reps    Clam Limitations  abd bracing + alt hip ABD/ER with green TB    Bent Knee Raise  3 seconds   x 12 reps    Bent Knee Raise Limitations  brace marching + green TB     Bridge  --   x 12 reps    Bridge Limitations  + green TB hip ABD isometric      Lumbar Exercises: Sidelying   Clam  Right;Left;10 reps    Clam Limitations  yellow TB       Moist Heat Therapy   Number Minutes Moist Heat  15 Minutes    Moist Heat Location  Lumbar Spine      Electrical Stimulation   Electrical Stimulation Location  Lumbar paraspinals    Electrical Stimulation Action  IFC    Electrical Stimulation Parameters  80-150Hz , intensity to pt. 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  Lumbar paraspinals K-taping pattern:  two I-strips along lumbar paraspinals onto B superior buttocks (30 % stretch), 1 horiz cross strip across B PSIS at point of most pain (30% stretch)             PT Education - 05/22/19 1045    Education Details  HEP update;  bridge+green TB at knees,  alternating clam shell with band, sit to stand (no hands), sidelying clam shell (no resistance)    Person(s) Educated  Patient    Methods  Explanation;Demonstration;Verbal cues;Handout    Comprehension  Verbalized understanding;Returned demonstration;Verbal cues required       PT Short Term Goals - 05/18/19 0943      PT SHORT TERM GOAL #1   Title  Patient to be independent with initial HEP.    Status   On-going    Target Date  06/01/19      PT SHORT TERM GOAL #2   Title  Patient will verbalize/demonstrate understanding of neutral spine posture and proper body mechanics to reduce strain on lumbar spine.    Status  On-going    Target Date  06/01/19        PT Long Term Goals - 05/18/19 0944      PT LONG TERM GOAL #1   Title  Patient to be independent with advanced/ongoing HEP.    Status  On-going    Target Date  06/22/19      PT LONG TERM GOAL #2   Title  Patient to demonstrate appropriate posture and body mechanics needed for daily activities    Status  On-going    Target Date  06/22/19      PT LONG TERM GOAL #3   Title  Patient to demonstrate improved tissue quality and pliability with reduced pain.    Status  On-going    Target Date  06/22/19      PT LONG TERM GOAL #4   Title  Patient to report pain reduction in frequency and intensity by >/= 50%.    Status  On-going    Target Date  06/22/19      PT LONG TERM GOAL #5   Title  Patient will report minimal to no sleep disturbance due to pain or muscle cramping.    Status  On-going    Target Date  06/22/19            Plan - 05/22/19 0807    Clinical Impression Statement  Wake with complaint of R-sided back pain this morning while driving over to session.  Session with progression of lumbopelvic strengthening activities and HEP updated with activities which were well tolerated focusing on proximal hip strengthening.  Continued E-stim/moist heat to lumbar spine to end session for pain relief and trialed K-taping to lumbar spine for hopeful reduction in pain with daily activities.  Will monitor response in coming session.    Comorbidities  prostate CA, HLD, hay fever, GERD, chronic back pain s/p L3-4 fusion, asthma, R achilles tendon repair, B knee arthroscopy,R finger surgery, cardiac cath 2004, ACDF 2016    Rehab Potential  Good    PT Treatment/Interventions  ADLs/Self Care Home Management;Cryotherapy;Electrical  Stimulation;Iontophoresis 4mg /ml Dexamethasone;Moist Heat;Balance training;Therapeutic exercise;Therapeutic activities;Functional mobility training;Stair training;Gait training;Ultrasound;Neuromuscular re-education;Patient/family education;Manual techniques;Vasopneumatic Device;Taping;Energy conservation;Dry needling;Passive range of motion;Scar mobilization    PT Next Visit Plan  proximal LE flexibility with manual STM/MFR and possible DN to address abnormal muscle tightness; lumbopelvic/core stabilization & strengthening; modalities PRN    PT Home Exercise Plan  05/11/19 - quad, HS & pirformis stretches, self-STM with rolling pin; 05/22/19 - bridge+green TB at knees,  alternating clam shell with band, sit to stand (no hands), sidelying clam shell (no resistance)    Consulted and Agree with Plan of Care  Patient       Patient will benefit from skilled therapeutic intervention in order to improve the following deficits and impairments:  Decreased activity tolerance, Decreased endurance, Decreased mobility, Decreased range of motion, Decreased strength, Difficulty walking, Increased fascial restricitons, Increased muscle spasms, Impaired perceived functional ability, Impaired flexibility, Impaired sensation, Improper body mechanics, Postural dysfunction, Pain  Visit Diagnosis: Chronic bilateral low back pain with right-sided sciatica  Radiculopathy, lumbosacral region  Muscle spasm of back  Cramp and spasm  Problem List Patient Active Problem List   Diagnosis Date Noted  . Infection, skin 07/26/2018  . Achilles tendon infection 07/26/2018  . Wound dehiscence, surgical 07/10/2018    Class: Acute  . Wound dehiscence, surgical, initial encounter 07/10/2018  . Spondylolisthesis of lumbar region 08/31/2016  . Neck pain 06/27/2014  . Prostate cancer [facility])     Bess Harvest, PTA 05/22/19 12:37 PM   [facility] [address]   [place], [place], [address] Phone: [phone]   Fax:  [phone]  Name: Terry [name] MRN: [id number] Date of Birth: 25-Oct-1950

## 2019-05-25 ENCOUNTER — Ambulatory Visit: Payer: Medicare Other

## 2019-05-25 ENCOUNTER — Other Ambulatory Visit: Payer: Self-pay

## 2019-05-25 DIAGNOSIS — M5417 Radiculopathy, lumbosacral region: Secondary | ICD-10-CM

## 2019-05-25 DIAGNOSIS — M25571 Pain in right ankle and joints of right foot: Secondary | ICD-10-CM | POA: Diagnosis not present

## 2019-05-25 DIAGNOSIS — M62830 Muscle spasm of back: Secondary | ICD-10-CM

## 2019-05-25 DIAGNOSIS — G8929 Other chronic pain: Secondary | ICD-10-CM

## 2019-05-25 DIAGNOSIS — R252 Cramp and spasm: Secondary | ICD-10-CM

## 2019-05-25 NOTE — Therapy (Signed)
[facility] [address]  [place], [place], [address] Phone: [phone]   Fax:  [phone]  Physical Therapy Treatment  Patient Details  Name: Terry [name] MRN: [id number] Date of Birth: 02/24/1951 Referring Provider (PT): Earleen Newport, MD   Encounter Date: 05/25/2019  PT End of Session - 05/25/19 0945    Visit Number  4    Number of Visits  12    Date for PT Re-Evaluation  06/22/19    Authorization Type  Medicare (KX through 06/01/19) & BCBS    PT Start Time  0932    PT Stop Time  1034    PT Time Calculation (min)  62 min    Activity Tolerance  Patient tolerated treatment well    Behavior During Therapy  [facility] for tasks assessed/performed       Past Medical History:  Diagnosis Date  . Arthritis    neck, back, knees, right ankle  . Bladder calculus   . Chronic back pain   . Complication of anesthesia    dizziness and "crazy amount of bile"  . GERD (gastroesophageal reflux disease)   . History of colon polyps    benign  . History of prostate cancer urologist-- dr Alinda Money   dx 2014---  s/p  prostatectomy 05-22-2013 ,  Gleason 3+3  . Hypercholesterolemia    takes Crestor daily  . Hyperlipidemia   . Wears glasses     Past Surgical History:  Procedure Laterality Date  . ACHILLES TENDON SURGERY Right 06-07-2018   @[facility]    reconstruction  . ANTERIOR CERVICAL DECOMP/DISCECTOMY FUSION N/A 06/27/2014   Procedure: ANTERIOR CERVICAL DECOMPRESSION/DISCECTOMY FUSION C5-C7   (2 LEVELS);  Surgeon: Melina Schools, MD;  Location: [place];  Service: Orthopedics;  Laterality: N/A;  . CARDIAC CATHETERIZATION  2004  . COLONOSCOPY    . CYSTOSCOPY WITH LITHOLAPAXY N/A 03/27/2019   Procedure: CYSTOSCOPY WITH REMOVAL OF BLADDER STONE AND FOREIGN BODY;  Surgeon: Raynelle Bring, MD;  Location: [facility];  Service: Urology;  Laterality: N/A;  . EYE SURGERY     lazy eye as child, lasik surgery  . FINGER  SURGERY Right    4th finger  . I & D EXTREMITY Right 07/10/2018   Procedure: IRRIGATION AND DEBRIDEMENT RIGHT ANKLE WOUND AND WOUND VAC PLACEMENT;  Surgeon: Wylene Simmer, MD;  Location: [place];  Service: Orthopedics;  Laterality: Right;  . KNEE ARTHROSCOPY Bilateral right ?;  left 05-25-2007  @[facility]   . POSTERIOR LAMINECTOMY / DECOMPRESSION LUMBAR SPINE  08-31-2016    dr elsner  @[facility]   . PROSTATE BIOPSY  04/13/13   gleason 3+3=6, vol 55.4 cc  . ROBOT ASSISTED LAPAROSCOPIC RADICAL PROSTATECTOMY N/A 05/22/2013   Procedure: ROBOTIC ASSISTED LAPAROSCOPIC RADICAL PROSTATECTOMY LEVEL 1;  Surgeon: Dutch Gray, MD;  Location: [facility];  Service: Urology;  Laterality: N/A;  . TONSILLECTOMY      There were no vitals filed for this visit.  Subjective Assessment - 05/25/19 0937    Subjective  Pt. reporting his back pain has been waking him up.    Pertinent History  prostate CA, HLD, hay fever, GERD, chronic back pain s/p L3-4 fusion 2018, asthma, R achilles tendon repair, B knee arthroscopy,R finger surgery, cardiac cath 2004, ACDF 2016    Diagnostic tests  no recent imaging available    Patient Stated Goals  "fix my back"    Currently in Pain?  Yes    Pain Score  5  Pain Location  Back    Pain Orientation  Lower    Pain Descriptors / Indicators  Dull    Pain Type  Chronic pain    Pain Radiating Towards  pain and tinging down R lateral thigh    Pain Onset  More than a month ago    Pain Frequency  Intermittent    Aggravating Factors   prolonged driving, laying flat in bed    Multiple Pain Sites  Yes    Pain Score  2    Pain Location  Axilla    Pain Orientation  Right;Medial    Pain Descriptors / Indicators  Constant   " annoying "   Pain Type  Chronic pain                       [facility] Adult PT Treatment/Exercise - 05/25/19 0001      Lumbar Exercises: Stretches   Passive Hamstring Stretch  Right;Left;30 seconds;2 reps    Passive Hamstring Stretch Limitations  supine with strap;  opp LE bent     Piriformis Stretch  Right;Left;30 seconds;2 reps    Piriformis Stretch Limitations  supine KTOS    Figure 4 Stretch  1 rep;30 seconds    Figure 4 Stretch Limitations  B;  sitting       Lumbar Exercises: Aerobic   Nustep  L4 x 7 min (LE only)      Lumbar Exercises: Supine   Clam  15 reps;3 seconds    Clam Limitations  abd bracing + alt hip ABD/ER with green TB    Bent Knee Raise  15 reps;3 seconds    Bent Knee Raise Limitations  brace marching + green TB     Bridge  15 reps;3 seconds    Bridge Limitations  + green TB hip ABD isometric      Lumbar Exercises: Sidelying   Clam  Right;Left;10 reps   cues required to prevent trunk rotation    Clam Limitations  yellow TB       Knee/Hip Exercises: Standing   Forward Step Up  Right;10 reps;Step Height: 6";Hand Hold: 1    Forward Step Up Limitations  cues for R glute activation     Functional Squat  10 reps;3 seconds    Functional Squat Limitations  TM hold       Moist Heat Therapy   Number Minutes Moist Heat  15 Minutes    Moist Heat Location  Lumbar Spine      Electrical Stimulation   Electrical Stimulation Location  Lumbar paraspinals    Electrical Stimulation Action  IFC    Electrical Stimulation Parameters  80-150Hz , intensity to pt. tolerance, 15'    Electrical Stimulation Goals  Pain      Manual Therapy   Manual Therapy  Soft tissue mobilization;Myofascial release    Manual therapy comments  sidelying     Soft tissue mobilization  STM/DTM to R buttocks/piriformis, glute med - ttp throughout    Myofascial Release  TPR to R piri, glute med               PT Short Term Goals - 05/18/19 0943      PT SHORT TERM GOAL #1   Title  Patient to be independent with initial HEP.    Status  On-going    Target Date  06/01/19      PT SHORT TERM GOAL #2   Title  Patient will verbalize/demonstrate understanding of neutral spine  posture and proper body mechanics to reduce strain on lumbar spine.    Status   On-going    Target Date  06/01/19        PT Long Term Goals - 05/18/19 0944      PT LONG TERM GOAL #1   Title  Patient to be independent with advanced/ongoing HEP.    Status  On-going    Target Date  06/22/19      PT LONG TERM GOAL #2   Title  Patient to demonstrate appropriate posture and body mechanics needed for daily activities    Status  On-going    Target Date  06/22/19      PT LONG TERM GOAL #3   Title  Patient to demonstrate improved tissue quality and pliability with reduced pain.    Status  On-going    Target Date  06/22/19      PT LONG TERM GOAL #4   Title  Patient to report pain reduction in frequency and intensity by >/= 50%.    Status  On-going    Target Date  06/22/19      PT LONG TERM GOAL #5   Title  Patient will report minimal to no sleep disturbance due to pain or muscle cramping.    Status  On-going    Target Date  06/22/19            Plan - 05/25/19 1002    Clinical Impression Statement  Pt. primary complaint is R buttocks pain which radiates into R posterior/lateral thigh.  MT addressing increased tone noted in R glute max, glute med, piriformis musculature with pt. ttp throughout.  Followed up with R proximal hip/LE stretching and progression of proximal hip strengthening in supine and standing positions.  Initated R forward step-up and functional squat with cues for glute activation.  Pt. does continue to require heavy cueing for proper pacing, technique, and to maintain count of repetitions with therex.  Ended visit with E-stim/moist heat applied to lumbosacral region in area of most pain as pt. noting limited relief from K-taping trialed last visit.  Pt. leaving session noting reduction in pain from initial report to start session.  Pt. admitting to partial HEP adherence and HEP activities he is performing daily, "while watching TV".  Encouraged pt. to perform full HEP as handout instructs for full benefit from therapy.    Personal Factors and  Comorbidities  Age;Comorbidity 3+;Time since onset of injury/illness/exacerbation;Fitness;Past/Current Experience    Comorbidities  prostate CA, HLD, hay fever, GERD, chronic back pain s/p L3-4 fusion, asthma, R achilles tendon repair, B knee arthroscopy,R finger surgery, cardiac cath 2004, ACDF 2016    Rehab Potential  Good    PT Treatment/Interventions  ADLs/Self Care Home Management;Cryotherapy;Electrical Stimulation;Iontophoresis 4mg /ml Dexamethasone;Moist Heat;Balance training;Therapeutic exercise;Therapeutic activities;Functional mobility training;Stair training;Gait training;Ultrasound;Neuromuscular re-education;Patient/family education;Manual techniques;Vasopneumatic Device;Taping;Energy conservation;Dry needling;Passive range of motion;Scar mobilization    PT Next Visit Plan  proximal LE flexibility with manual STM/MFR and possible DN to address abnormal muscle tightness; lumbopelvic/core stabilization & strengthening; modalities PRN    PT Home Exercise Plan  05/11/19 - quad, HS & pirformis stretches, self-STM with rolling pin; 05/22/19 - bridge+green TB at knees,  alternating clam shell with band, sit to stand (no hands), sidelying clam shell (no resistance)    Consulted and Agree with Plan of Care  Patient       Patient will benefit from skilled therapeutic intervention in order to improve the following deficits and impairments:  Decreased activity tolerance, Decreased  endurance, Decreased mobility, Decreased range of motion, Decreased strength, Difficulty walking, Increased fascial restricitons, Increased muscle spasms, Impaired perceived functional ability, Impaired flexibility, Impaired sensation, Improper body mechanics, Postural dysfunction, Pain  Visit Diagnosis: Chronic bilateral low back pain with right-sided sciatica  Radiculopathy, lumbosacral region  Muscle spasm of back  Cramp and spasm     Problem List Patient Active Problem List   Diagnosis Date Noted  . Infection,  skin 07/26/2018  . Achilles tendon infection 07/26/2018  . Wound dehiscence, surgical 07/10/2018    Class: Acute  . Wound dehiscence, surgical, initial encounter 07/10/2018  . Spondylolisthesis of lumbar region 08/31/2016  . Neck pain 06/27/2014  . Prostate cancer [facility])     Bess Harvest, PTA 05/25/19 1:34 PM    [facility] [address]  [place], [place], [address] Phone: [phone]   Fax:  [phone]  Name: Terry [name] MRN: [id number] Date of Birth: 10-20-1950

## 2019-05-31 ENCOUNTER — Ambulatory Visit: Payer: Medicare Other

## 2019-05-31 ENCOUNTER — Other Ambulatory Visit: Payer: Self-pay

## 2019-05-31 DIAGNOSIS — M62830 Muscle spasm of back: Secondary | ICD-10-CM

## 2019-05-31 DIAGNOSIS — M25571 Pain in right ankle and joints of right foot: Secondary | ICD-10-CM | POA: Diagnosis not present

## 2019-05-31 DIAGNOSIS — G8929 Other chronic pain: Secondary | ICD-10-CM

## 2019-05-31 DIAGNOSIS — M5441 Lumbago with sciatica, right side: Secondary | ICD-10-CM

## 2019-05-31 DIAGNOSIS — R252 Cramp and spasm: Secondary | ICD-10-CM

## 2019-05-31 DIAGNOSIS — M5417 Radiculopathy, lumbosacral region: Secondary | ICD-10-CM

## 2019-05-31 NOTE — Patient Instructions (Signed)

## 2019-05-31 NOTE — Therapy (Signed)
[facility] [address]  [place], [place], [address] Phone: [phone]   Fax:  [phone]  Physical Therapy Treatment  Patient Details  Name: Terry [name] MRN: [id number] Date of Birth: 09-Apr-1951 Referring Provider (PT): Earleen Newport, MD   Encounter Date: 05/31/2019  PT End of Session - 05/31/19 0859    Visit Number  5    Number of Visits  12    Date for PT Re-Evaluation  06/22/19    Authorization Type  Medicare (KX through 06/01/19) & BCBS    PT Start Time  [phone]    PT Stop Time  0945    PT Time Calculation (min)  54 min    Activity Tolerance  Patient tolerated treatment well    Behavior During Therapy  [facility] for tasks assessed/performed       Past Medical History:  Diagnosis Date  . Arthritis    neck, back, knees, right ankle  . Bladder calculus   . Chronic back pain   . Complication of anesthesia    dizziness and "crazy amount of bile"  . GERD (gastroesophageal reflux disease)   . History of colon polyps    benign  . History of prostate cancer urologist-- dr Alinda Money   dx 2014---  s/p  prostatectomy 05-22-2013 ,  Gleason 3+3  . Hypercholesterolemia    takes Crestor daily  . Hyperlipidemia   . Wears glasses     Past Surgical History:  Procedure Laterality Date  . ACHILLES TENDON SURGERY Right 06-07-2018   @[facility]    reconstruction  . ANTERIOR CERVICAL DECOMP/DISCECTOMY FUSION N/A 06/27/2014   Procedure: ANTERIOR CERVICAL DECOMPRESSION/DISCECTOMY FUSION C5-C7   (2 LEVELS);  Surgeon: Melina Schools, MD;  Location: [place];  Service: Orthopedics;  Laterality: N/A;  . CARDIAC CATHETERIZATION  2004  . COLONOSCOPY    . CYSTOSCOPY WITH LITHOLAPAXY N/A 03/27/2019   Procedure: CYSTOSCOPY WITH REMOVAL OF BLADDER STONE AND FOREIGN BODY;  Surgeon: Raynelle Bring, MD;  Location: [facility];  Service: Urology;  Laterality: N/A;  . EYE SURGERY     lazy eye as child, lasik surgery  . FINGER  SURGERY Right    4th finger  . I & D EXTREMITY Right 07/10/2018   Procedure: IRRIGATION AND DEBRIDEMENT RIGHT ANKLE WOUND AND WOUND VAC PLACEMENT;  Surgeon: Wylene Simmer, MD;  Location: [place];  Service: Orthopedics;  Laterality: Right;  . KNEE ARTHROSCOPY Bilateral right ?;  left 05-25-2007  @[facility]   . POSTERIOR LAMINECTOMY / DECOMPRESSION LUMBAR SPINE  08-31-2016    dr elsner  @[facility]   . PROSTATE BIOPSY  04/13/13   gleason 3+3=6, vol 55.4 cc  . ROBOT ASSISTED LAPAROSCOPIC RADICAL PROSTATECTOMY N/A 05/22/2013   Procedure: ROBOTIC ASSISTED LAPAROSCOPIC RADICAL PROSTATECTOMY LEVEL 1;  Surgeon: Dutch Gray, MD;  Location: [facility];  Service: Urology;  Laterality: N/A;  . TONSILLECTOMY      There were no vitals filed for this visit.  Subjective Assessment - 05/31/19 0853    Subjective  Pt. reporting he walked one mile on Monday and was able to without significant soreness.    Pertinent History  prostate CA, HLD, hay fever, GERD, chronic back pain s/p L3-4 fusion 2018, asthma, R achilles tendon repair, B knee arthroscopy,R finger surgery, cardiac cath 2004, ACDF 2016    Diagnostic tests  no recent imaging available    Patient Stated Goals  "fix my back"    Currently in Pain?  No/denies  Pain Score  0-No pain   pain rising to 5/10 at times   Pain Location  Back    Pain Orientation  Lower    Pain Descriptors / Indicators  Dull    Pain Type  Chronic pain                       [facility] Adult PT Treatment/Exercise - 05/31/19 0001      Self-Care   Self-Care  Other Self-Care Comments;Posture    Posture  Instruction in proper sitting and standing posture as to reduce lumbar strain     Other Self-Care Comments   Instruction in proper body mechanics with daily household tasks such as picking up object from floor, sweeping, and kitchen work as to reduce lumbar strain       Lumbar Exercises: Stretches   Lumbar Stabilization Level 1  3 reps;20 seconds    Lumbar Stabilization Level 1  Limitations  green p-ball seated rollouts 3-way     Piriformis Stretch  Right;Left;30 seconds;2 reps    Piriformis Stretch Limitations  sitting     Figure 4 Stretch  30 seconds;2 reps    Figure 4 Stretch Limitations  B;  sitting       Lumbar Exercises: Aerobic   Recumbent Bike  Lvl 2, 7 min       Lumbar Exercises: Seated   Sit to Stand  10 reps   pt. noting he has not been performing this    Sit to Stand Limitations  cues for abd. bracing       Lumbar Exercises: Supine   Clam  15 reps;3 seconds    Clam Limitations  abd bracing + alt hip ABD/ER with green TB    Bridge  15 reps;3 seconds    Bridge Limitations  + green TB hip ABD isometric      Moist Heat Therapy   Number Minutes Moist Heat  15 Minutes    Moist Heat Location  Lumbar Spine      Electrical Stimulation   Electrical Stimulation Location  Lumbar paraspinals    Electrical Stimulation Action  IFC    Electrical Stimulation Parameters  80-150Hz , intensity to pt. tolerance, 15'    Electrical Stimulation Goals  Pain               PT Short Term Goals - 05/31/19 0903      PT SHORT TERM GOAL #1   Title  Patient to be independent with initial HEP.    Status  Achieved    Target Date  06/01/19      PT SHORT TERM GOAL #2   Title  Patient will verbalize/demonstrate understanding of neutral spine posture and proper body mechanics to reduce strain on lumbar spine.    Status  On-going    Target Date  06/01/19        PT Long Term Goals - 05/18/19 0944      PT LONG TERM GOAL #1   Title  Patient to be independent with advanced/ongoing HEP.    Status  On-going    Target Date  06/22/19      PT LONG TERM GOAL #2   Title  Patient to demonstrate appropriate posture and body mechanics needed for daily activities    Status  On-going    Target Date  06/22/19      PT LONG TERM GOAL #3   Title  Patient to demonstrate improved tissue quality and pliability with reduced pain.  Status  On-going    Target Date  06/22/19       PT LONG TERM GOAL #4   Title  Patient to report pain reduction in frequency and intensity by >/= 50%.    Status  On-going    Target Date  06/22/19      PT LONG TERM GOAL #5   Title  Patient will report minimal to no sleep disturbance due to pain or muscle cramping.    Status  On-going    Target Date  06/22/19            Plan - 05/31/19 0902    Clinical Impression Statement  Pt. noting he has been more consistent with home program since last visit.  Denies questions regarding HEP.  STG #1 achieved.  Has been walking more often and able to walk 1 mile a few days ago which is, "a milestone".  Tolerated continue lumbopelvic strengthening activities today well and focused session on instruction on proper posture and body mechanics with daily tasks as to reduce lumbar strain.  Ended visit with E-stim/moist heat to lumbar spine for relief of remaining LBP after session today.  Pt. noting good relief.    Comorbidities  prostate CA, HLD, hay fever, GERD, chronic back pain s/p L3-4 fusion, asthma, R achilles tendon repair, B knee arthroscopy,R finger surgery, cardiac cath 2004, ACDF 2016    Rehab Potential  Good    PT Treatment/Interventions  ADLs/Self Care Home Management;Cryotherapy;Electrical Stimulation;Iontophoresis 4mg /ml Dexamethasone;Moist Heat;Balance training;Therapeutic exercise;Therapeutic activities;Functional mobility training;Stair training;Gait training;Ultrasound;Neuromuscular re-education;Patient/family education;Manual techniques;Vasopneumatic Device;Taping;Energy conservation;Dry needling;Passive range of motion;Scar mobilization    PT Next Visit Plan  proximal LE flexibility with manual STM/MFR and possible DN to address abnormal muscle tightness; lumbopelvic/core stabilization & strengthening; modalities PRN    PT Home Exercise Plan  05/11/19 - quad, HS & pirformis stretches, self-STM with rolling pin; 05/22/19 - bridge+green TB at knees,  alternating clam shell with band,  sit to stand (no hands), sidelying clam shell (no resistance)    Consulted and Agree with Plan of Care  Patient       Patient will benefit from skilled therapeutic intervention in order to improve the following deficits and impairments:  Decreased activity tolerance, Decreased endurance, Decreased mobility, Decreased range of motion, Decreased strength, Difficulty walking, Increased fascial restricitons, Increased muscle spasms, Impaired perceived functional ability, Impaired flexibility, Impaired sensation, Improper body mechanics, Postural dysfunction, Pain  Visit Diagnosis: Chronic bilateral low back pain with right-sided sciatica  Radiculopathy, lumbosacral region  Muscle spasm of back  Cramp and spasm     Problem List Patient Active Problem List   Diagnosis Date Noted  . Infection, skin 07/26/2018  . Achilles tendon infection 07/26/2018  . Wound dehiscence, surgical 07/10/2018    Class: Acute  . Wound dehiscence, surgical, initial encounter 07/10/2018  . Spondylolisthesis of lumbar region 08/31/2016  . Neck pain 06/27/2014  . Prostate cancer [facility])     Bess Harvest, PTA 05/31/19 12:36 PM   [facility] [address]  [place], [place], [address] Phone: [phone]   Fax:  [phone]  Name: Terry [name] MRN: [id number] Date of Birth: 06/09/1950

## 2019-06-06 ENCOUNTER — Ambulatory Visit: Payer: Medicare Other | Attending: Neurological Surgery

## 2019-06-06 ENCOUNTER — Other Ambulatory Visit: Payer: Self-pay

## 2019-06-06 DIAGNOSIS — G8929 Other chronic pain: Secondary | ICD-10-CM | POA: Diagnosis not present

## 2019-06-06 DIAGNOSIS — M62830 Muscle spasm of back: Secondary | ICD-10-CM

## 2019-06-06 DIAGNOSIS — R252 Cramp and spasm: Secondary | ICD-10-CM | POA: Insufficient documentation

## 2019-06-06 DIAGNOSIS — M5441 Lumbago with sciatica, right side: Secondary | ICD-10-CM | POA: Diagnosis not present

## 2019-06-06 DIAGNOSIS — M5417 Radiculopathy, lumbosacral region: Secondary | ICD-10-CM

## 2019-06-06 NOTE — Therapy (Signed)
[facility] [address]  [place], [place], [address] Phone: [phone]   Fax:  [phone]  Physical Therapy Treatment  Patient Details  Name: Terry [name] MRN: [id number] Date of Birth: 10-11-1950 Referring Provider (PT): Earleen Newport, MD   Encounter Date: 06/06/2019  PT End of Session - 06/06/19 0803    Visit Number  6    Number of Visits  12    Date for PT Re-Evaluation  06/22/19    Authorization Type  Medicare (KX through 06/01/19) & BCBS    PT Start Time  0800    PT Stop Time  7619   Ended visit with 10 min moist heat   PT Time Calculation (min)  55 min    Activity Tolerance  Patient tolerated treatment well    Behavior During Therapy  [facility] for tasks assessed/performed       Past Medical History:  Diagnosis Date  . Arthritis    neck, back, knees, right ankle  . Bladder calculus   . Chronic back pain   . Complication of anesthesia    dizziness and "crazy amount of bile"  . GERD (gastroesophageal reflux disease)   . History of colon polyps    benign  . History of prostate cancer urologist-- dr Alinda Money   dx 2014---  s/p  prostatectomy 05-22-2013 ,  Gleason 3+3  . Hypercholesterolemia    takes Crestor daily  . Hyperlipidemia   . Wears glasses     Past Surgical History:  Procedure Laterality Date  . ACHILLES TENDON SURGERY Right 06-07-2018   '@[facility]'    reconstruction  . ANTERIOR CERVICAL DECOMP/DISCECTOMY FUSION N/A 06/27/2014   Procedure: ANTERIOR CERVICAL DECOMPRESSION/DISCECTOMY FUSION C5-C7   (2 LEVELS);  Surgeon: Melina Schools, MD;  Location: [place];  Service: Orthopedics;  Laterality: N/A;  . CARDIAC CATHETERIZATION  2004  . COLONOSCOPY    . CYSTOSCOPY WITH LITHOLAPAXY N/A 03/27/2019   Procedure: CYSTOSCOPY WITH REMOVAL OF BLADDER STONE AND FOREIGN BODY;  Surgeon: Raynelle Bring, MD;  Location: [facility];  Service: Urology;  Laterality: N/A;  . EYE SURGERY     lazy eye as  child, lasik surgery  . FINGER SURGERY Right    4th finger  . I & D EXTREMITY Right 07/10/2018   Procedure: IRRIGATION AND DEBRIDEMENT RIGHT ANKLE WOUND AND WOUND VAC PLACEMENT;  Surgeon: Wylene Simmer, MD;  Location: [place];  Service: Orthopedics;  Laterality: Right;  . KNEE ARTHROSCOPY Bilateral right ?;  left 05-25-2007  '@[facility]'   . POSTERIOR LAMINECTOMY / DECOMPRESSION LUMBAR SPINE  08-31-2016    dr elsner  '@[facility]'   . PROSTATE BIOPSY  04/13/13   gleason 3+3=6, vol 55.4 cc  . ROBOT ASSISTED LAPAROSCOPIC RADICAL PROSTATECTOMY N/A 05/22/2013   Procedure: ROBOTIC ASSISTED LAPAROSCOPIC RADICAL PROSTATECTOMY LEVEL 1;  Surgeon: Dutch Gray, MD;  Location: [facility];  Service: Urology;  Laterality: N/A;  . TONSILLECTOMY      There were no vitals filed for this visit.  Subjective Assessment - 06/06/19 0806    Subjective  Pt. noting he walked 2/3 of a mile x 2 since last visit and able to perform without excessive soreness.    Pertinent History  prostate CA, HLD, hay fever, GERD, chronic back pain s/p L3-4 fusion 2018, asthma, R achilles tendon repair, B knee arthroscopy,R finger surgery, cardiac cath 2004, ACDF 2016    Diagnostic tests  no recent imaging available    Patient Stated Goals  "fix  my back"    Currently in Pain?  Yes    Pain Score  5     Pain Location  Back    Pain Orientation  Lower    Pain Descriptors / Indicators  Dull    Pain Type  Chronic pain    Pain Radiating Towards  pain and tingling down R lateral thigh    Pain Onset  More than a month ago    Pain Frequency  Intermittent                       [facility] Adult PT Treatment/Exercise - 06/06/19 0001      Self-Care   Self-Care  Other Self-Care Comments    Other Self-Care Comments   reviewed proper posture and body mechancis with daily takss and sleeping positions to reduce lumbar strain;  pt. noting he has altered some of his positioning at home in sleeping positions and daily chores as to reduce lumbar strain       Lumbar Exercises: Stretches   Single Knee to Chest Stretch  Right;1 rep;30 seconds    Piriformis Stretch  Right;30 seconds;1 rep    Piriformis Stretch Limitations  sitting       Lumbar Exercises: Aerobic   Recumbent Bike  Lvl 2, 7 min       Lumbar Exercises: Supine   Bridge  15 reps;3 seconds    Bridge Limitations  + green TB hip ABD isometric      Knee/Hip Exercises: Standing   Lateral Step Up  Right;10 reps;Step Height: 8";Hand Hold: 2    Lateral Step Up Limitations  Cues for slow eccnentric lowering     Forward Step Up  Right;10 reps;Hand Hold: 1;Step Height: 8"    Forward Step Up Limitations  cues for R glute activation     Functional Squat  15 reps;3 seconds    Functional Squat Limitations  TM+ red TB at knees       Moist Heat Therapy   Number Minutes Moist Heat  10 Minutes    Moist Heat Location  Hip   R hip      Manual Therapy   Manual Therapy  Soft tissue mobilization;Myofascial release    Manual therapy comments  sidelying     Soft tissue mobilization  STM/DTM to R buttocks/piriformis, glute med - ttp throughout    Myofascial Release  TPR to R piri, glute med               PT Short Term Goals - 06/06/19 1238      PT SHORT TERM GOAL #1   Title  Patient to be independent with initial HEP.    Status  Achieved    Target Date  06/01/19      PT SHORT TERM GOAL #2   Title  Patient will verbalize/demonstrate understanding of neutral spine posture and proper body mechanics to reduce strain on lumbar spine.    Status  Achieved    Target Date  06/01/19        PT Long Term Goals - 05/18/19 0944      PT LONG TERM GOAL #1   Title  Patient to be independent with advanced/ongoing HEP.    Status  On-going    Target Date  06/22/19      PT LONG TERM GOAL #2   Title  Patient to demonstrate appropriate posture and body mechanics needed for daily activities    Status  On-going  Target Date  06/22/19      PT LONG TERM GOAL #3   Title  Patient to demonstrate  improved tissue quality and pliability with reduced pain.    Status  On-going    Target Date  06/22/19      PT LONG TERM GOAL #4   Title  Patient to report pain reduction in frequency and intensity by >/= 50%.    Status  On-going    Target Date  06/22/19      PT LONG TERM GOAL #5   Title  Patient will report minimal to no sleep disturbance due to pain or muscle cramping.    Status  On-going    Target Date  06/22/19            Plan - 06/06/19 0810    Clinical Impression Statement  Pt. noting ~ 25% improvement in overall pain levels since starting with therapy.  Notes he has altered a few of his daily chores and sleeping positions as to reduce lumbar stain with some relief noted.  STG #2 met.  Progressed standing lumbopelvic strengthening activities which were well tolerated and MT targeting ongoing R buttocks, glute med, piriformis ttp/tension.  Ended session with moist heat applied to R buttocks in sidelying.  Pt. leaving session noting some relief form moist heat.    Comorbidities  prostate CA, HLD, hay fever, GERD, chronic back pain s/p L3-4 fusion, asthma, R achilles tendon repair, B knee arthroscopy,R finger surgery, cardiac cath 2004, ACDF 2016    Rehab Potential  Good    PT Treatment/Interventions  ADLs/Self Care Home Management;Cryotherapy;Electrical Stimulation;Iontophoresis 7m/ml Dexamethasone;Moist Heat;Balance training;Therapeutic exercise;Therapeutic activities;Functional mobility training;Stair training;Gait training;Ultrasound;Neuromuscular re-education;Patient/family education;Manual techniques;Vasopneumatic Device;Taping;Energy conservation;Dry needling;Passive range of motion;Scar mobilization    PT Next Visit Plan  proximal LE flexibility with manual STM/MFR and possible DN to address abnormal muscle tightness; lumbopelvic/core stabilization & strengthening; modalities PRN    Consulted and Agree with Plan of Care  Patient       Patient will benefit from skilled  therapeutic intervention in order to improve the following deficits and impairments:  Decreased activity tolerance, Decreased endurance, Decreased mobility, Decreased range of motion, Decreased strength, Difficulty walking, Increased fascial restricitons, Increased muscle spasms, Impaired perceived functional ability, Impaired flexibility, Impaired sensation, Improper body mechanics, Postural dysfunction, Pain  Visit Diagnosis: Chronic bilateral low back pain with right-sided sciatica  Radiculopathy, lumbosacral region  Muscle spasm of back  Cramp and spasm     Problem List Patient Active Problem List   Diagnosis Date Noted  . Infection, skin 07/26/2018  . Achilles tendon infection 07/26/2018  . Wound dehiscence, surgical 07/10/2018    Class: Acute  . Wound dehiscence, surgical, initial encounter 07/10/2018  . Spondylolisthesis of lumbar region 08/31/2016  . Neck pain 06/27/2014  . Prostate cancer ([facility]     MBess Harvest PTA 06/06/19 12:45 PM   [facility] [address] [place] [address]: [phone]  Fax:  [phone] Name: Terry BARBIANMRN: [id number] of Birth: 06/13/1950-11-26

## 2019-06-06 NOTE — Patient Instructions (Signed)

## 2019-06-09 ENCOUNTER — Ambulatory Visit: Payer: Medicare Other

## 2019-06-09 ENCOUNTER — Other Ambulatory Visit: Payer: Self-pay

## 2019-06-09 DIAGNOSIS — M62830 Muscle spasm of back: Secondary | ICD-10-CM | POA: Diagnosis not present

## 2019-06-09 DIAGNOSIS — M5441 Lumbago with sciatica, right side: Secondary | ICD-10-CM | POA: Diagnosis not present

## 2019-06-09 DIAGNOSIS — G8929 Other chronic pain: Secondary | ICD-10-CM

## 2019-06-09 DIAGNOSIS — R252 Cramp and spasm: Secondary | ICD-10-CM

## 2019-06-09 DIAGNOSIS — M5417 Radiculopathy, lumbosacral region: Secondary | ICD-10-CM

## 2019-06-09 NOTE — Therapy (Signed)
[facility] [address]  [place], [place], [address] Phone: [phone]   Fax:  [phone]  Physical Therapy Treatment  Patient Details  Name: Terry [name] MRN: [id number] Date of Birth: 05-Sep-1950 Referring Provider (PT): Earleen Newport, MD   Encounter Date: 06/09/2019  PT End of Session - 06/09/19 0940    Visit Number  7    Number of Visits  12    Date for PT Re-Evaluation  06/22/19    Authorization Type  Medicare (KX through 06/01/19) & BCBS    PT Start Time  0931    PT Stop Time  1030    PT Time Calculation (min)  59 min    Activity Tolerance  Patient tolerated treatment well    Behavior During Therapy  [facility] for tasks assessed/performed       Past Medical History:  Diagnosis Date  . Arthritis    neck, back, knees, right ankle  . Bladder calculus   . Chronic back pain   . Complication of anesthesia    dizziness and "crazy amount of bile"  . GERD (gastroesophageal reflux disease)   . History of colon polyps    benign  . History of prostate cancer urologist-- dr Alinda Money   dx 2014---  s/p  prostatectomy 05-22-2013 ,  Gleason 3+3  . Hypercholesterolemia    takes Crestor daily  . Hyperlipidemia   . Wears glasses     Past Surgical History:  Procedure Laterality Date  . ACHILLES TENDON SURGERY Right 06-07-2018   @[facility]    reconstruction  . ANTERIOR CERVICAL DECOMP/DISCECTOMY FUSION N/A 06/27/2014   Procedure: ANTERIOR CERVICAL DECOMPRESSION/DISCECTOMY FUSION C5-C7   (2 LEVELS);  Surgeon: Melina Schools, MD;  Location: [facility];  Service: Orthopedics;  Laterality: N/A;  . CARDIAC CATHETERIZATION  2004  . COLONOSCOPY    . CYSTOSCOPY WITH LITHOLAPAXY N/A 03/27/2019   Procedure: CYSTOSCOPY WITH REMOVAL OF BLADDER STONE AND FOREIGN BODY;  Surgeon: Raynelle Bring, MD;  Location[facility];  Service: Urology;  Laterality: N/A;  . EYE SURGERY     lazy eye as child, lasik surgery  . FINGER SURGERY  Right    4th finger  . I & D EXTREMITY Right 07/10/2018   Procedure: IRRIGATION AND DEBRIDEMENT RIGHT ANKLE WOUND AND WOUND VAC PLACEMENT;  Surgeon: Wylene Simmer, MD;  Location: [facility];  Service: Orthopedics;  Laterality: Right;  . KNEE ARTHROSCOPY Bilateral right ?;  left 05-25-2007  @[facility]   . POSTERIOR LAMINECTOMY / DECOMPRESSION LUMBAR SPINE  08-31-2016    dr elsner  @[facility]   . PROSTATE BIOPSY  04/13/13   gleason 3+3=6, vol 55.4 cc  . ROBOT ASSISTED LAPAROSCOPIC RADICAL PROSTATECTOMY N/A 05/22/2013   Procedure: ROBOTIC ASSISTED LAPAROSCOPIC RADICAL PROSTATECTOMY LEVEL 1;  Surgeon: Dutch Gray, MD;  Location: [facility];  Service: Urology;  Laterality: N/A;  . TONSILLECTOMY      There were no vitals filed for this visit.  Subjective Assessment - 06/09/19 0937    Subjective  Had increased pain after last session which lasted for a full day before it subsided.    Pertinent History  prostate CA, HLD, hay fever, GERD, chronic back pain s/p L3-4 fusion 2018, asthma, R achilles tendon repair, B knee arthroscopy,R finger surgery, cardiac cath 2004, ACDF 2016    Diagnostic tests  no recent imaging available    Patient Stated Goals  "fix my back"    Currently in Pain?  Yes  Pain Score  5     Pain Location  Back    Pain Radiating Towards  pain into R hip/buttocks and into R lateral thigh    Pain Onset  More than a month ago    Pain Frequency  Intermittent    Aggravating Factors   prolonged driving    Pain Relieving Factors  heating pads    Multiple Pain Sites  Yes                       [facility] Adult PT Treatment/Exercise - 06/09/19 0001      Lumbar Exercises: Aerobic   Recumbent Bike  Lvl 2, 3 min    switched to nustep due to R buttocks pain    Nustep  Lvl 3, lvl 4 (LE only)      Lumbar Exercises: Supine   Bridge  15 reps;3 seconds    Bridge Limitations  + green TB hip ABD isometric      Knee/Hip Exercises: Standing   Functional Squat  15 reps;3 seconds    Functional Squat  Limitations  Green TB at knees + iso hip abd/ER into band       Moist Heat Therapy   Number Minutes Moist Heat  15 Minutes    Moist Heat Location  Lumbar Spine      Electrical Stimulation   Electrical Stimulation Location  Lumbar paraspinals and superior buttocks     Electrical Stimulation Action  IFC    Electrical Stimulation Parameters  80-150Hz , intensity to pt. tolerance, 15'    Electrical Stimulation Goals  Pain      Manual Therapy   Manual Therapy  Soft tissue mobilization;Myofascial release    Manual therapy comments  sidelying     Soft tissue mobilization  STM/DTM to R glute med/piriformis - ttp R glute med>piri    Myofascial Release  TPR to R piri, glute med     Passive ROM  Manual R piriformis, HS, glute max, glute med stretch with therapist support x 30 sec     Kinesiotex  IT sales professional  R gluteus medius taping pattern (30% stretch on all strips; R piriformis taping pattern with single strip across point of most pain R mid piriformis (30% stretch)               PT Short Term Goals - 06/06/19 1238      PT SHORT TERM GOAL #1   Title  Patient to be independent with initial HEP.    Status  Achieved    Target Date  06/01/19      PT SHORT TERM GOAL #2   Title  Patient will verbalize/demonstrate understanding of neutral spine posture and proper body mechanics to reduce strain on lumbar spine.    Status  Achieved    Target Date  06/01/19        PT Long Term Goals - 05/18/19 0944      PT LONG TERM GOAL #1   Title  Patient to be independent with advanced/ongoing HEP.    Status  On-going    Target Date  06/22/19      PT LONG TERM GOAL #2   Title  Patient to demonstrate appropriate posture and body mechanics needed for daily activities    Status  On-going    Target Date  06/22/19      PT LONG TERM GOAL #3   Title  Patient  to demonstrate improved tissue quality and pliability with reduced pain.    Status  On-going    Target  Date  06/22/19      PT LONG TERM GOAL #4   Title  Patient to report pain reduction in frequency and intensity by >/= 50%.    Status  On-going    Target Date  06/22/19      PT LONG TERM GOAL #5   Title  Patient will report minimal to no sleep disturbance due to pain or muscle cramping.    Status  On-going    Target Date  06/22/19            Plan - 06/09/19 0940    Clinical Impression Statement  Pt. noting he is waking up in bed with cramps in R lateral thigh and R lateral ankle musculature every other day frequent still.  Notes frequency of cramps since starting PT has not changed.  Does note some improvement in pain at lower back and hip since starting PT however reports increased pain after last session which took one day to subside and increased radiating pain down R lateral thigh.  MT addressing ongoing tenderness in R piriformis, R glute med with trial of glute Medius K-taping applied for hopeful relaxation of musculature.  Duration of session focused on glute/proximal hip strengthening activities with counter squat/band abduction and bridge/abd.  Pt. requesting E-stim/moist heat to end session in hooklying as he did not feel he tolerated sidelying well last session.  Pt. left session noting pain relief following modalities.    Comorbidities  prostate CA, HLD, hay fever, GERD, chronic back pain s/p L3-4 fusion, asthma, R achilles tendon repair, B knee arthroscopy,R finger surgery, cardiac cath 2004, ACDF 2016    Rehab Potential  Good    PT Treatment/Interventions  ADLs/Self Care Home Management;Cryotherapy;Electrical Stimulation;Iontophoresis 4mg /ml Dexamethasone;Moist Heat;Balance training;Therapeutic exercise;Therapeutic activities;Functional mobility training;Stair training;Gait training;Ultrasound;Neuromuscular re-education;Patient/family education;Manual techniques;Vasopneumatic Device;Taping;Energy conservation;Dry needling;Passive range of motion;Scar mobilization    PT Next Visit  Plan  proximal LE flexibility with manual STM/MFR and possible DN to address abnormal muscle tightness; lumbopelvic/core stabilization & strengthening; modalities PRN    PT Home Exercise Plan  05/11/19 - quad, HS & pirformis stretches, self-STM with rolling pin; 05/22/19 - bridge+green TB at knees,  alternating clam shell with band, sit to stand (no hands), sidelying clam shell (no resistance)    Consulted and Agree with Plan of Care  Patient       Patient will benefit from skilled therapeutic intervention in order to improve the following deficits and impairments:  Decreased activity tolerance, Decreased endurance, Decreased mobility, Decreased range of motion, Decreased strength, Difficulty walking, Increased fascial restricitons, Increased muscle spasms, Impaired perceived functional ability, Impaired flexibility, Impaired sensation, Improper body mechanics, Postural dysfunction, Pain  Visit Diagnosis: Chronic bilateral low back pain with right-sided sciatica  Radiculopathy, lumbosacral region  Muscle spasm of back  Cramp and spasm     Problem List Patient Active Problem List   Diagnosis Date Noted  . Infection, skin 07/26/2018  . Achilles tendon infection 07/26/2018  . Wound dehiscence, surgical 07/10/2018    Class: Acute  . Wound dehiscence, surgical, initial encounter 07/10/2018  . Spondylolisthesis of lumbar region 08/31/2016  . Neck pain 06/27/2014  . Prostate cancer [facility])     Bess Harvest, PTA 06/09/19 12:34 PM   [facility] [address]  [place], [place], [address] Phone: [phone]   Fax:  [phone]  Name: Terry [name] MRN: [id number] Date of Birth: Dec 25, 1950

## 2019-06-13 ENCOUNTER — Other Ambulatory Visit: Payer: Self-pay

## 2019-06-13 ENCOUNTER — Ambulatory Visit: Payer: Medicare Other

## 2019-06-13 DIAGNOSIS — M5441 Lumbago with sciatica, right side: Secondary | ICD-10-CM | POA: Diagnosis not present

## 2019-06-13 DIAGNOSIS — M62830 Muscle spasm of back: Secondary | ICD-10-CM

## 2019-06-13 DIAGNOSIS — R252 Cramp and spasm: Secondary | ICD-10-CM

## 2019-06-13 DIAGNOSIS — M5417 Radiculopathy, lumbosacral region: Secondary | ICD-10-CM | POA: Diagnosis not present

## 2019-06-13 DIAGNOSIS — G8929 Other chronic pain: Secondary | ICD-10-CM | POA: Diagnosis not present

## 2019-06-13 NOTE — Therapy (Signed)
[facility] [address]  [place], [place], [address] Phone: [phone]   Fax:  [phone]  Physical Therapy Treatment  Patient Details  Name: Terry [name] MRN: [id number] Date of Birth: [birth year] Referring Provider (PT): Earleen Newport, MD   Encounter Date: 06/13/2019  PT End of Session - 06/13/19 0808    Visit Number  8    Number of Visits  12    Date for PT Re-Evaluation  06/22/19    Authorization Type  Medicare (KX through 06/01/19) & BCBS    PT Start Time  0800    PT Stop Time  0913    PT Time Calculation (min)  73 min    Activity Tolerance  Patient tolerated treatment well    Behavior During Therapy  [facility] for tasks assessed/performed       Past Medical History:  Diagnosis Date  . Arthritis    neck, back, knees, right ankle  . Bladder calculus   . Chronic back pain   . Complication of anesthesia    dizziness and "crazy amount of bile"  . GERD (gastroesophageal reflux disease)   . History of colon polyps    benign  . History of prostate cancer urologist-- dr Alinda Money   dx 2014---  s/p  prostatectomy 05-22-2013 ,  Gleason 3+3  . Hypercholesterolemia    takes Crestor daily  . Hyperlipidemia   . Wears glasses     Past Surgical History:  Procedure Laterality Date  . ACHILLES TENDON SURGERY Right 06-07-2018   @[facility]    reconstruction  . ANTERIOR CERVICAL DECOMP/DISCECTOMY FUSION N/A 06/27/2014   Procedure: ANTERIOR CERVICAL DECOMPRESSION/DISCECTOMY FUSION C5-C7   (2 LEVELS);  Surgeon: Melina Schools, MD;  Location: [facility];  Service: Orthopedics;  Laterality: N/A;  . CARDIAC CATHETERIZATION  2004  . COLONOSCOPY    . CYSTOSCOPY WITH LITHOLAPAXY N/A 03/27/2019   Procedure: CYSTOSCOPY WITH REMOVAL OF BLADDER STONE AND FOREIGN BODY;  Surgeon: Raynelle Bring, MD;  Location: [facility];  Service: Urology;  Laterality: N/A;  . EYE SURGERY     lazy eye as child, lasik surgery  . FINGER SURGERY  Right    4th finger  . I & D EXTREMITY Right 07/10/2018   Procedure: IRRIGATION AND DEBRIDEMENT RIGHT ANKLE WOUND AND WOUND VAC PLACEMENT;  Surgeon: Wylene Simmer, MD;  Location: [facility];  Service: Orthopedics;  Laterality: Right;  . KNEE ARTHROSCOPY Bilateral right ?;  left 05-25-2007  @[facility]   . POSTERIOR LAMINECTOMY / DECOMPRESSION LUMBAR SPINE  08-31-2016    dr elsner  @[facility]   . PROSTATE BIOPSY  04/13/13   gleason 3+3=6, vol 55.4 cc  . ROBOT ASSISTED LAPAROSCOPIC RADICAL PROSTATECTOMY N/A 05/22/2013   Procedure: ROBOTIC ASSISTED LAPAROSCOPIC RADICAL PROSTATECTOMY LEVEL 1;  Surgeon: Dutch Gray, MD;  Location: [facility];  Service: Urology;  Laterality: N/A;  . TONSILLECTOMY      There were no vitals filed for this visit.  Subjective Assessment - 06/13/19 0804    Subjective  Pt. increased pain last night and cramps in his R lower leg which subsided with walking.    Pertinent History  prostate CA, HLD, hay fever, GERD, chronic back pain s/p L3-4 fusion 2018, asthma, R achilles tendon repair, B knee arthroscopy,R finger surgery, cardiac cath 2004, ACDF 2016    How long can you sit comfortably?  1 hour    How long can you stand comfortably?  20 min    How long can  you walk comfortably?  2/3 mile    Diagnostic tests  no recent imaging available    Patient Stated Goals  "fix my back"    Currently in Pain?  Yes    Pain Score  8     Pain Location  Back    Pain Orientation  Lower    Pain Descriptors / Indicators  Dull   " cramping " in R LE last night   Pain Type  Chronic pain    Pain Radiating Towards  pain into R hip/buttocks and R lateral thigh    Pain Onset  More than a month ago    Pain Frequency  Intermittent    Aggravating Factors   prolonged driving    Pain Relieving Factors  heating pads    Multiple Pain Sites  Yes    Pain Score  4    Pain Location  Ankle    Pain Orientation  Right;Medial    Pain Descriptors / Indicators  Constant    Pain Type  Chronic pain    Pain Frequency  Constant          [facility] PT Assessment - 06/13/19 0001      Assessment   Medical Diagnosis  R lumbar radiculopathy    Referring Provider (PT)  Earleen Newport, MD    Hand Dominance  Right    Next MD Visit  nothing scheduled    Prior Therapy  yes                   [facility] Adult PT Treatment/Exercise - 06/13/19 0001      Self-Care   Self-Care  Other Self-Care Comments    Other Self-Care Comments   Pt. instructed to hold off on performing sidelying clam shell as he notes this activity had been causing him pain      Lumbar Exercises: Stretches   Passive Hamstring Stretch  Left;2 reps;30 seconds    Passive Hamstring Stretch Limitations  supine with strap     Hip Flexor Stretch  2 reps;Right;30 seconds    Hip Flexor Stretch Limitations  mod thomas with strap      Lumbar Exercises: Aerobic   Nustep  Lvl 4, 7 min (LE only)      Lumbar Exercises: Machines for Strengthening   Other Lumbar Machine Exercise  B single row 10# x 10 rpes each way       Lumbar Exercises: Standing   Row  Both;15 reps;Strengthening;Theraband    Theraband Level (Row)  Level 2 (Red)    Row Limitations  scapular retraction focus     Shoulder Extension  Both;10 reps;Strengthening;Theraband    Theraband Level (Shoulder Extension)  Level 2 (Red)    Shoulder Extension Limitations  scapular retraction     Other Standing Lumbar Exercises  B pallof press with double red TB x 10 reps       Knee/Hip Exercises: Supine   Bridges with Clamshell  Both   x 12 reps; + isometric hip abd/ER into red looped TB      Moist Heat Therapy   Number Minutes Moist Heat  10 Minutes   only 10 min per pt. request    Moist Heat Location  Lumbar Spine      Electrical Stimulation   Electrical Stimulation Location  Lumbar paraspinals and superior buttocks     Electrical Stimulation Action  IFC    Electrical Stimulation Parameters  80-150Hz , intensity to pt. tolerance, 10'    Printmaker  Goals  Pain      Manual Therapy    Manual Therapy  Taping;Soft tissue mobilization;Myofascial release    Manual therapy comments  sidelying and standing     Soft tissue mobilization  STM/DTM to R mid pirfirmis, R-sided lumbar paraspinals     Myofascial Release  TPR to R mid piriformis - palpable TP    Passive ROM  Manual R piriformis, glute max, L HS stretch with therapist support x 30 sec     Other Manual Therapy  Manual correction of apparant SI joint malalignment: apparent L posterior innominate rotation    Kinesiotex  Create Space      Kinesiotix   Create Space  R SI Joint "star pattern - 3 short strips": 30% stretch for all strips      Ankle Exercises: Aerobic   Nustep  L4 x 7 min (LEs only)             PT Education - 06/13/19 0925    Education Details  HEP update; red TB issued to pt. for standing pallof press, row, extension/row    Person(s) Educated  Patient    Methods  Explanation;Demonstration;Verbal cues;Handout    Comprehension  Verbalized understanding       PT Short Term Goals - 06/06/19 1238      PT SHORT TERM GOAL #1   Title  Patient to be independent with initial HEP.    Status  Achieved    Target Date  06/01/19      PT SHORT TERM GOAL #2   Title  Patient will verbalize/demonstrate understanding of neutral spine posture and proper body mechanics to reduce strain on lumbar spine.    Status  Achieved    Target Date  06/01/19        PT Long Term Goals - 05/18/19 0944      PT LONG TERM GOAL #1   Title  Patient to be independent with advanced/ongoing HEP.    Status  On-going    Target Date  06/22/19      PT LONG TERM GOAL #2   Title  Patient to demonstrate appropriate posture and body mechanics needed for daily activities    Status  On-going    Target Date  06/22/19      PT LONG TERM GOAL #3   Title  Patient to demonstrate improved tissue quality and pliability with reduced pain.    Status  On-going    Target Date  06/22/19      PT LONG TERM GOAL #4   Title  Patient to report  pain reduction in frequency and intensity by >/= 50%.    Status  On-going    Target Date  06/22/19      PT LONG TERM GOAL #5   Title  Patient will report minimal to no sleep disturbance due to pain or muscle cramping.    Status  On-going    Target Date  06/22/19            Plan - 06/13/19 0808    Clinical Impression Statement  Jamichael reporting average of 8/10 LBP/R hip pain over past few days.  Notes he has been going up and down stairs "more than usual" as his work office at home is upstairs.  Manual inspection of SI joint positioning with apparent L posterior innominate rotation which was manually corrected in session today.  Pt. noting decrease in pain from 8/10>5/10 after gentle proximal hip strengthening, SI joint correction maneuvers, and MT  TPR to palpable TP in R piriformis.  Ended visit with application of trial of K-taping to R PSIS with "star pattern" and application of moist heat/E-stim to lumbar spine as pt. noting pain relief with this in previous visits.  Pt. verbalizing he may be interested in DN to address increased tension/TP noted in R buttocks/piriformis thus may consider this if indicated in coming sessions.    Comorbidities  prostate CA, HLD, hay fever, GERD, chronic back pain s/p L3-4 fusion, asthma, R achilles tendon repair, B knee arthroscopy,R finger surgery, cardiac cath 2004, ACDF 2016    Rehab Potential  Good    PT Treatment/Interventions  ADLs/Self Care Home Management;Cryotherapy;Electrical Stimulation;Iontophoresis 4mg /ml Dexamethasone;Moist Heat;Balance training;Therapeutic exercise;Therapeutic activities;Functional mobility training;Stair training;Gait training;Ultrasound;Neuromuscular re-education;Patient/family education;Manual techniques;Vasopneumatic Device;Taping;Energy conservation;Dry needling;Passive range of motion;Scar mobilization    PT Next Visit Plan  proximal LE flexibility with manual STM/MFR and possible DN to address abnormal muscle tightness;  lumbopelvic/core stabilization & strengthening; modalities PRN    PT Home Exercise Plan  05/11/19 - quad, HS & pirformis stretches, self-STM with rolling pin; 05/22/19 - bridge+green TB at knees,  alternating clam shell with band, sit to stand (no hands), sidelying clam shell (no resistance)    Consulted and Agree with Plan of Care  Patient       Patient will benefit from skilled therapeutic intervention in order to improve the following deficits and impairments:  Decreased activity tolerance, Decreased endurance, Decreased mobility, Decreased range of motion, Decreased strength, Difficulty walking, Increased fascial restricitons, Increased muscle spasms, Impaired perceived functional ability, Impaired flexibility, Impaired sensation, Improper body mechanics, Postural dysfunction, Pain  Visit Diagnosis: Chronic bilateral low back pain with right-sided sciatica  Radiculopathy, lumbosacral region  Muscle spasm of back  Cramp and spasm     Problem List Patient Active Problem List   Diagnosis Date Noted  . Infection, skin 07/26/2018  . Achilles tendon infection 07/26/2018  . Wound dehiscence, surgical 07/10/2018    Class: Acute  . Wound dehiscence, surgical, initial encounter 07/10/2018  . Spondylolisthesis of lumbar region 08/31/2016  . Neck pain 06/27/2014  . Prostate cancer [facility])    Bess Harvest, PTA 06/13/19 1:11 PM   [facility] [address]  [place], [place], [address] Phone: [phone]   Fax:  [phone]  Name: Terry [name] MRN: [id number] Date of Birth: November 12, 1950

## 2019-06-15 DIAGNOSIS — H5053 Vertical heterophoria: Secondary | ICD-10-CM | POA: Diagnosis not present

## 2019-06-16 ENCOUNTER — Ambulatory Visit: Payer: Medicare Other | Admitting: Physical Therapy

## 2019-06-16 ENCOUNTER — Other Ambulatory Visit: Payer: Self-pay

## 2019-06-16 DIAGNOSIS — R252 Cramp and spasm: Secondary | ICD-10-CM | POA: Diagnosis not present

## 2019-06-16 DIAGNOSIS — G8929 Other chronic pain: Secondary | ICD-10-CM

## 2019-06-16 DIAGNOSIS — M62830 Muscle spasm of back: Secondary | ICD-10-CM | POA: Diagnosis not present

## 2019-06-16 DIAGNOSIS — M5417 Radiculopathy, lumbosacral region: Secondary | ICD-10-CM | POA: Diagnosis not present

## 2019-06-16 DIAGNOSIS — M5441 Lumbago with sciatica, right side: Secondary | ICD-10-CM | POA: Diagnosis not present

## 2019-06-16 NOTE — Therapy (Addendum)
[facility] [address]  [place], [place], [address] Phone: [phone]   Fax:  [phone]  Physical Therapy Treatment / Progress Note  Patient Details  Name: Terry [name] MRN: [id number] Date of Birth: [birth year] Referring Provider (PT): Earleen Newport, MD  Progress Note  Reporting Period 05/11/2019 to 06/16/2019  See note below for Objective Data and Assessment of Progress/Goals.     Encounter Date: 06/16/2019  PT End of Session - 06/16/19 0802    Visit Number  9    Number of Visits  12    Date for PT Re-Evaluation  06/22/19    Authorization Type  Medicare & BCBS    PT Start Time  0802    PT Stop Time  0848    PT Time Calculation (min)  46 min    Activity Tolerance  Patient tolerated treatment well    Behavior During Therapy  WFL for tasks assessed/performed       Past Medical History:  Diagnosis Date  . Arthritis    neck, back, knees, right ankle  . Bladder calculus   . Chronic back pain   . Complication of anesthesia    dizziness and "crazy amount of bile"  . GERD (gastroesophageal reflux disease)   . History of colon polyps    benign  . History of prostate cancer urologist-- dr Alinda Money   dx 2014---  s/p  prostatectomy 05-22-2013 ,  Gleason 3+3  . Hypercholesterolemia    takes Crestor daily  . Hyperlipidemia   . Wears glasses     Past Surgical History:  Procedure Laterality Date  . ACHILLES TENDON SURGERY Right 06-07-2018   '@[facility]'    reconstruction  . ANTERIOR CERVICAL DECOMP/DISCECTOMY FUSION N/A 06/27/2014   Procedure: ANTERIOR CERVICAL DECOMPRESSION/DISCECTOMY FUSION C5-C7   (2 LEVELS);  Surgeon: Melina Schools, MD;  Location: [place];  Service: Orthopedics;  Laterality: N/A;  . CARDIAC CATHETERIZATION  2004  . COLONOSCOPY    . CYSTOSCOPY WITH LITHOLAPAXY N/A 03/27/2019   Procedure: CYSTOSCOPY WITH REMOVAL OF BLADDER STONE AND FOREIGN BODY;  Surgeon: Raynelle Bring, MD;  Location: [facility];  Service: Urology;  Laterality: N/A;  . EYE SURGERY     lazy eye as child, lasik surgery  . FINGER SURGERY Right    4th finger  . I & D EXTREMITY Right 07/10/2018   Procedure: IRRIGATION AND DEBRIDEMENT RIGHT ANKLE WOUND AND WOUND VAC PLACEMENT;  Surgeon: Wylene Simmer, MD;  Location: [place];  Service: Orthopedics;  Laterality: Right;  . KNEE ARTHROSCOPY Bilateral right ?;  left 05-25-2007  '@[facility]'   . POSTERIOR LAMINECTOMY / DECOMPRESSION LUMBAR SPINE  08-31-2016    dr elsner  '@[facility]'   . PROSTATE BIOPSY  04/13/13   gleason 3+3=6, vol 55.4 cc  . ROBOT ASSISTED LAPAROSCOPIC RADICAL PROSTATECTOMY N/A 05/22/2013   Procedure: ROBOTIC ASSISTED LAPAROSCOPIC RADICAL PROSTATECTOMY LEVEL 1;  Surgeon: Dutch Gray, MD;  Location: [facility];  Service: Urology;  Laterality: N/A;  . TONSILLECTOMY      There were no vitals filed for this visit.  Subjective Assessment - 06/16/19 0808    Subjective  Pt reporting pain has remained increased with only potential trigger being increased up/down stairs. Expressing interest in trying DN for back and buttocks today.    Pertinent History  prostate CA, HLD, hay fever, GERD, chronic back pain s/p L3-4 fusion 2018, asthma, R achilles tendon repair, B knee arthroscopy,R finger surgery, cardiac cath 2004, ACDF 2016  Diagnostic tests  no recent imaging available    Patient Stated Goals  "fix my back"    Currently in Pain?  Yes    Pain Score  8    7-8/10   Pain Location  Back    Pain Orientation  Lower    Pain Descriptors / Indicators  Dull    Pain Type  Chronic pain                       [facility] Adult PT Treatment/Exercise - 06/16/19 0802      Lumbar Exercises: Stretches   Quadruped Mid Back Stretch  30 seconds;3 reps    Quadruped Mid Back Stretch Limitations  seated 3-way prayer stretch    Piriformis Stretch  Right;30 seconds    Piriformis Stretch Limitations  sitting     Figure 4 Stretch  30 seconds;2 reps;Seated;With overpressure       Lumbar Exercises: Aerobic   Nustep  L4 x 6 min (LEs)      Manual Therapy   Manual Therapy  Soft tissue mobilization;Myofascial release    Manual therapy comments  prone    Soft tissue mobilization  STM/DTM to B lumbar paraspinals, R glutes & piriformis    Myofascial Release  MFR pin & stretch to R lumbar paraspinals; manual TPR to R glute medius/minimus & mid piriformis - palpable reduction in muscle tension following STM/MFR and DN       Trigger Point Dry Needling - 06/16/19 0802    Consent Given?  Yes    Muscles Treated Back/Hip  Gluteus minimus;Gluteus medius;Gluteus maximus;Piriformis;Lumbar multifidi    Gluteus Minimus Response  Twitch response elicited;Palpable increased muscle length   Right   Gluteus Medius Response  Twitch response elicited;Palpable increased muscle length   Right   Gluteus Maximus Response  Twitch response elicited;Palpable increased muscle length   Right   Piriformis Response  Twitch response elicited;Palpable increased muscle length   Right   Lumbar multifidi Response  Twitch response elicited;Palpable increased muscle length             PT Short Term Goals - 06/16/19 0848      PT SHORT TERM GOAL #1   Title  Patient to be independent with initial HEP.    Status  Achieved   05/31/19     PT SHORT TERM GOAL #2   Title  Patient will verbalize/demonstrate understanding of neutral spine posture and proper body mechanics to reduce strain on lumbar spine.    Status  Achieved   06/06/19       PT Long Term Goals - 06/16/19 0848      PT LONG TERM GOAL #1   Title  Patient to be independent with advanced/ongoing HEP.    Status  Partially Met    Target Date  06/22/19      PT LONG TERM GOAL #2   Title  Patient to demonstrate appropriate posture and body mechanics needed for daily activities    Status  Achieved   06/16/19     PT LONG TERM GOAL #3   Title  Patient to demonstrate improved tissue quality and pliability with reduced pain.     Status  On-going    Target Date  06/22/19      PT LONG TERM GOAL #4   Title  Patient to report pain reduction in frequency and intensity by >/= 50%.    Status  On-going    Target Date  06/22/19  PT LONG TERM GOAL #5   Title  Patient will report minimal to no sleep disturbance due to pain or muscle cramping.    Status  On-going    Target Date  06/22/19            Plan - 06/16/19 0848    Clinical Impression Statement  Terry [name] noting worsening pain of late with uncertain aggravating factor(s) other than potentially due to increased up/down stairs while working at home. He remains interested in trying DN for increased muscle tension in lumbar paraspinals and R glutes, therefore incorporated DN upon informed patient consent into manual STM and MFR with positive twitch responses elicited and palpable reduction in muscle tension. Patient uncertain of any notable change in pain level following this but aware that some post-DN muscle soreness is to be expected and maximal results may take up to 24-48 hrs - will reassess response next visit. Patient has one week remaining in current POC and feels like he would like to try transitioning to the HEP for now while he and his surgeon determine if he will need to proceed with extension of his existing lumbar fusion to the adjacent levels above and below, thus will plan for review and update of HEP as indicated in remaining 2 visits. Currently only posture LTG (#3) met and HEP LTG (#1) partially met with remaining goals ongoing.    Comorbidities  prostate CA, HLD, hay fever, GERD, chronic back pain s/p L3-4 fusion, asthma, R achilles tendon repair, B knee arthroscopy,R finger surgery, cardiac cath 2004, ACDF 2016    Rehab Potential  Good    PT Frequency  2x / week    PT Duration  6 weeks    PT Treatment/Interventions  ADLs/Self Care Home Management;Cryotherapy;Electrical Stimulation;Iontophoresis 76m/ml Dexamethasone;Moist Heat;Balance training;Therapeutic  exercise;Therapeutic activities;Functional mobility training;Stair training;Gait training;Ultrasound;Neuromuscular re-education;Patient/family education;Manual techniques;Vasopneumatic Device;Taping;Energy conservation;Dry needling;Passive range of motion;Scar mobilization    PT Next Visit Plan  proximal LE flexibility with manual STM/MFR and possible DN to address abnormal muscle tightness; lumbopelvic/core stabilization & strengthening; modalities PRN    PT Home Exercise Plan  05/11/19 - quad, HS & pirformis stretches, self-STM with rolling pin; 05/22/19 - bridge+green TB at knees,  alternating clam shell with band, sit to stand (no hands), sidelying clam shell (no resistance)    Consulted and Agree with Plan of Care  Patient       Patient will benefit from skilled therapeutic intervention in order to improve the following deficits and impairments:  Decreased activity tolerance, Decreased endurance, Decreased mobility, Decreased range of motion, Decreased strength, Difficulty walking, Increased fascial restricitons, Increased muscle spasms, Impaired perceived functional ability, Impaired flexibility, Impaired sensation, Improper body mechanics, Postural dysfunction, Pain  Visit Diagnosis: Chronic bilateral low back pain with right-sided sciatica  Radiculopathy, lumbosacral region  Muscle spasm of back  Cramp and spasm     Problem List Patient Active Problem List   Diagnosis Date Noted  . Infection, skin 07/26/2018  . Achilles tendon infection 07/26/2018  . Wound dehiscence, surgical 07/10/2018    Class: Acute  . Wound dehiscence, surgical, initial encounter 07/10/2018  . Spondylolisthesis of lumbar region 08/31/2016  . Neck pain 06/27/2014  . Prostate cancer ([facility]     JPercival Spanish PT, MPT 06/16/2019, 12:02 PM  [facility] [place] [place] [address]Phone: [phone]  Fax:   [phone] Name: Terry STEMMMRN: [id number] of Birth: 913-Sep-1952

## 2019-06-20 ENCOUNTER — Ambulatory Visit: Payer: Medicare Other

## 2019-06-20 ENCOUNTER — Other Ambulatory Visit: Payer: Self-pay

## 2019-06-20 DIAGNOSIS — M62830 Muscle spasm of back: Secondary | ICD-10-CM | POA: Diagnosis not present

## 2019-06-20 DIAGNOSIS — G8929 Other chronic pain: Secondary | ICD-10-CM | POA: Diagnosis not present

## 2019-06-20 DIAGNOSIS — M5441 Lumbago with sciatica, right side: Secondary | ICD-10-CM | POA: Diagnosis not present

## 2019-06-20 DIAGNOSIS — R252 Cramp and spasm: Secondary | ICD-10-CM | POA: Diagnosis not present

## 2019-06-20 DIAGNOSIS — M5417 Radiculopathy, lumbosacral region: Secondary | ICD-10-CM

## 2019-06-20 NOTE — Therapy (Signed)
[facility] [address]  [place], [place], [address] Phone: [phone]   Fax:  [phone]  Physical Therapy Treatment  Patient Details  Name: Terry [name] MRN: [id number] Date of Birth: 1951-05-16 Referring Provider (PT): Earleen Newport, MD   Encounter Date: 06/20/2019  PT End of Session - 06/20/19 0808    Visit Number  10    Number of Visits  12    Date for PT Re-Evaluation  06/22/19    Authorization Type  Medicare & BCBS    PT Start Time  0801    PT Stop Time  0859    PT Time Calculation (min)  58 min    Activity Tolerance  Patient tolerated treatment well    Behavior During Therapy  [facility] for tasks assessed/performed       Past Medical History:  Diagnosis Date  . Arthritis    neck, back, knees, right ankle  . Bladder calculus   . Chronic back pain   . Complication of anesthesia    dizziness and "crazy amount of bile"  . GERD (gastroesophageal reflux disease)   . History of colon polyps    benign  . History of prostate cancer urologist-- dr Alinda Money   dx 2014---  s/p  prostatectomy 05-22-2013 ,  Gleason 3+3  . Hypercholesterolemia    takes Crestor daily  . Hyperlipidemia   . Wears glasses     Past Surgical History:  Procedure Laterality Date  . ACHILLES TENDON SURGERY Right 06-07-2018   '[facility]'    reconstruction  . ANTERIOR CERVICAL DECOMP/DISCECTOMY FUSION N/A 06/27/2014   Procedure: ANTERIOR CERVICAL DECOMPRESSION/DISCECTOMY FUSION C5-C7   (2 LEVELS);  Surgeon: Melina Schools, MD;  Location: [place];  Service: Orthopedics;  Laterality: N/A;  . CARDIAC CATHETERIZATION  2004  . COLONOSCOPY    . CYSTOSCOPY WITH LITHOLAPAXY N/A 03/27/2019   Procedure: CYSTOSCOPY WITH REMOVAL OF BLADDER STONE AND FOREIGN BODY;  Surgeon: Raynelle Bring, MD;  Location: [facility];  Service: Urology;  Laterality: N/A;  . EYE SURGERY     lazy eye as child, lasik surgery  . FINGER SURGERY Right    4th finger   . I & D EXTREMITY Right 07/10/2018   Procedure: IRRIGATION AND DEBRIDEMENT RIGHT ANKLE WOUND AND WOUND VAC PLACEMENT;  Surgeon: Wylene Simmer, MD;  Location: [place];  Service: Orthopedics;  Laterality: Right;  . KNEE ARTHROSCOPY Bilateral right ?;  left 05-25-2007  '@[facility]'   . POSTERIOR LAMINECTOMY / DECOMPRESSION LUMBAR SPINE  08-31-2016    dr elsner  '@[facility]'   . PROSTATE BIOPSY  04/13/13   gleason 3+3=6, vol 55.4 cc  . ROBOT ASSISTED LAPAROSCOPIC RADICAL PROSTATECTOMY N/A 05/22/2013   Procedure: ROBOTIC ASSISTED LAPAROSCOPIC RADICAL PROSTATECTOMY LEVEL 1;  Surgeon: Dutch Gray, MD;  [facility];  Service: Urology;  Laterality: N/A;  . TONSILLECTOMY      There were no vitals filed for this visit.  Subjective Assessment - 06/20/19 0807    Subjective  Pt. reporting he wishes to transition to home program after next visit.    Pertinent History  prostate CA, HLD, hay fever, GERD, chronic back pain s/p L3-4 fusion 2018, asthma, R achilles tendon repair, B knee arthroscopy,R finger surgery, cardiac cath 2004, ACDF 2016    Diagnostic tests  no recent imaging available    Patient Stated Goals  "fix my back"    Currently in Pain?  Yes    Pain Score  7  Pain Location  Back    Pain Orientation  Lower    Pain Descriptors / Indicators  Dull    Pain Type  Chronic pain    Pain Onset  More than a month ago    Pain Frequency  Intermittent    Multiple Pain Sites  No                       [facility] Adult PT Treatment/Exercise - 06/20/19 0001      Self-Care   Self-Care  Other Self-Care Comments    Posture  Discussed need for staggered stance positioning with standing in kitchen doing dishes as pt. noting some increased LBP after prolonged dishes     Other Self-Care Comments   Reviewed comprehensive home program to check for appropriateness of activities;  Only removed roller stick to thigh activity as pt. noting good tolerance for other activities; appropriate band color listed on all band  resisted activities (see pt. education section)      Lumbar Exercises: Aerobic   Recumbent Bike  Lvl 2, 7 min    pillow behind back      Lumbar Exercises: Standing   Row  Both;15 reps;Strengthening;Theraband    Theraband Level (Row)  Level 2 (Red)    Row Limitations  cues required for scapular retraction     Shoulder Extension  Both;10 reps;Strengthening;Theraband   cues required for scapular retraction ROM    Theraband Level (Shoulder Extension)  Level 2 (Red)    Shoulder Extension Limitations  scapular retraction     Other Standing Lumbar Exercises  B pallof press with double red TB x 10 reps    cues required for proper standing positioning      Lumbar Exercises: Supine   Bridge  15 reps;3 seconds    Bridge Limitations  + green TB hip ABD isometric      Moist Heat Therapy   Number Minutes Moist Heat  15 Minutes    Moist Heat Location  Lumbar Spine   and superior buttocks      Electrical Stimulation   Electrical Stimulation Location  Lumbar paraspinals and superior buttocks     Electrical Stimulation Action  IFC    Electrical Stimulation Parameters  80-'150Hz' , intensity to pt. tolerance, 15'    Electrical Stimulation Goals  Pain             PT Education - 06/20/19 1314    Education Details  06/20/19 - HEP comprehensive list to prep for d/c: quad, HS & piriformis stretches, bridge + green TB at knees, alternating clam shell with green band, sit to stand (no hands), sidelying clam shell (no yellow or red), Standing red TB row, extension row, red TB Pallof press    Person(s) Educated  Patient    Methods  Explanation;Demonstration;Verbal cues;Handout    Comprehension  Verbalized understanding;Returned demonstration;Verbal cues required       PT Short Term Goals - 06/16/19 0848      PT SHORT TERM GOAL #1   Title  Patient to be independent with initial HEP.    Status  Achieved   05/31/19     PT SHORT TERM GOAL #2   Title  Patient will verbalize/demonstrate  understanding of neutral spine posture and proper body mechanics to reduce strain on lumbar spine.    Status  Achieved   06/06/19       PT Long Term Goals - 06/16/19 0848      PT LONG TERM  GOAL #1   Title  Patient to be independent with advanced/ongoing HEP.    Status  Partially Met    Target Date  06/22/19      PT LONG TERM GOAL #2   Title  Patient to demonstrate appropriate posture and body mechanics needed for daily activities    Status  Achieved   06/16/19     PT LONG TERM GOAL #3   Title  Patient to demonstrate improved tissue quality and pliability with reduced pain.    Status  On-going    Target Date  06/22/19      PT LONG TERM GOAL #4   Title  Patient to report pain reduction in frequency and intensity by >/= 50%.    Status  On-going    Target Date  06/22/19      PT LONG TERM GOAL #5   Title  Patient will report minimal to no sleep disturbance due to pain or muscle cramping.    Status  On-going    Target Date  06/22/19            Plan - 06/20/19 0808    Clinical Impression Statement  Pt. noting some mild improvement from recent DN last session.  Schneur reporting he still plans to transition to his home program and finish with therapy after next visit.  Session focused on review of updated HEP (red TB row, extension, Pallof press) and review/update of comprehensive HEP to check for understanding.  Pt. requiring cueing with row, extension row, and red TB Pallof press for proper positioning however much improved technique after instruction.  Ended visit with increased back pain thus applied E-stim/moist heat to lumbar spine/buttocks to promote reduction in muscular guarding and pain.  Will plan for final goal testing and final HEP review in coming session.    Comorbidities  prostate CA, HLD, hay fever, GERD, chronic back pain s/p L3-4 fusion, asthma, R achilles tendon repair, B knee arthroscopy,R finger surgery, cardiac cath 2004, ACDF 2016    Rehab Potential  Good    PT  Treatment/Interventions  ADLs/Self Care Home Management;Cryotherapy;Electrical Stimulation;Iontophoresis 37m/ml Dexamethasone;Moist Heat;Balance training;Therapeutic exercise;Therapeutic activities;Functional mobility training;Stair training;Gait training;Ultrasound;Neuromuscular re-education;Patient/family education;Manual techniques;Vasopneumatic Device;Taping;Energy conservation;Dry needling;Passive range of motion;Scar mobilization    PT Next Visit Plan  transition to home program per pt. request    PT Home Exercise Plan  06/20/19 - HEP comprehensive: quad, HS & piriformis stretches, bridge + green TB at knees, alternating clam shell with green band, sit to stand (no hands), sidelying clam shell (yellow or red), Standing red TB row, extension row, red TB Pallof press2/10/20 - quad, HS & pirformis stretches, self-STM with rolling pin; 05/22/19 - bridge+green TB at knees,  alternating clam shell with band, sit to stand (no hands), sidelying clam shell (no resistance)    Consulted and Agree with Plan of Care  Patient       Patient will benefit from skilled therapeutic intervention in order to improve the following deficits and impairments:  Decreased activity tolerance, Decreased endurance, Decreased mobility, Decreased range of motion, Decreased strength, Difficulty walking, Increased fascial restricitons, Increased muscle spasms, Impaired perceived functional ability, Impaired flexibility, Impaired sensation, Improper body mechanics, Postural dysfunction, Pain  Visit Diagnosis: Chronic bilateral low back pain with right-sided sciatica  Radiculopathy, lumbosacral region  Muscle spasm of back  Cramp and spasm     Problem List Patient Active Problem List   Diagnosis Date Noted  . Infection, skin 07/26/2018  . Achilles tendon infection 07/26/2018  .  Wound dehiscence, surgical 07/10/2018    Class: Acute  . Wound dehiscence, surgical, initial encounter 07/10/2018  . Spondylolisthesis of  lumbar region 08/31/2016  . Neck pain 06/27/2014  . Prostate cancer [facility])     Bess Harvest, PTA 06/20/19 1:21 PM   [facility] [address]  [place], [place], [address] Phone: [phone]   Fax:  [phone]  Name: WALTON DIGILIO MRN: [id number] Date of Birth: Dec 24, 1950

## 2019-06-22 ENCOUNTER — Other Ambulatory Visit: Payer: Self-pay

## 2019-06-22 ENCOUNTER — Ambulatory Visit: Payer: Medicare Other

## 2019-06-22 DIAGNOSIS — G8929 Other chronic pain: Secondary | ICD-10-CM

## 2019-06-22 DIAGNOSIS — M62830 Muscle spasm of back: Secondary | ICD-10-CM

## 2019-06-22 DIAGNOSIS — M5417 Radiculopathy, lumbosacral region: Secondary | ICD-10-CM

## 2019-06-22 DIAGNOSIS — R252 Cramp and spasm: Secondary | ICD-10-CM | POA: Diagnosis not present

## 2019-06-22 DIAGNOSIS — M5441 Lumbago with sciatica, right side: Secondary | ICD-10-CM | POA: Diagnosis not present

## 2019-06-22 NOTE — Therapy (Addendum)
[facility] [address]  [place], [place], [address] Phone: [phone]   Fax:  [phone]  Physical Therapy Treatment / Discharge Summary  Patient Details  Name: Terry [name] MRN: [id number] Date of Birth: September 26, 1950 Referring Provider (PT): Earleen Newport, MD   Encounter Date: 06/22/2019  PT End of Session - 06/22/19 0816    Visit Number  11    Number of Visits  12    Date for PT Re-Evaluation  06/22/19    Authorization Type  Medicare & BCBS    PT Start Time  0800    PT Stop Time  0853    PT Time Calculation (min)  53 min    Activity Tolerance  Patient tolerated treatment well    Behavior During Therapy  [facility] for tasks assessed/performed       Past Medical History:  Diagnosis Date  . Arthritis    neck, back, knees, right ankle  . Bladder calculus   . Chronic back pain   . Complication of anesthesia    dizziness and "crazy amount of bile"  . GERD (gastroesophageal reflux disease)   . History of colon polyps    benign  . History of prostate cancer urologist-- dr Alinda Money   dx 2014---  s/p  prostatectomy 05-22-2013 ,  Gleason 3+3  . Hypercholesterolemia    takes Crestor daily  . Hyperlipidemia   . Wears glasses     Past Surgical History:  Procedure Laterality Date  . ACHILLES TENDON SURGERY Right 06-07-2018   '@[facility]'    reconstruction  . ANTERIOR CERVICAL DECOMP/DISCECTOMY FUSION N/A 06/27/2014   Procedure: ANTERIOR CERVICAL DECOMPRESSION/DISCECTOMY FUSION C5-C7   (2 LEVELS);  Surgeon: Melina Schools, MD;  Location: [facility];  Service: Orthopedics;  Laterality: N/A;  . CARDIAC CATHETERIZATION  2004  . COLONOSCOPY    . CYSTOSCOPY WITH LITHOLAPAXY N/A 03/27/2019   Procedure: CYSTOSCOPY WITH REMOVAL OF BLADDER STONE AND FOREIGN BODY;  Surgeon: Raynelle Bring, MD;  Location: [facility];  Service: Urology;  Laterality: N/A;  . EYE SURGERY     lazy eye as child, lasik surgery  . FINGER SURGERY  Right    4th finger  . I & D EXTREMITY Right 07/10/2018   Procedure: IRRIGATION AND DEBRIDEMENT RIGHT ANKLE WOUND AND WOUND VAC PLACEMENT;  Surgeon: Wylene Simmer, MD;  Location: [facility];  Service: Orthopedics;  Laterality: Right;  . KNEE ARTHROSCOPY Bilateral right ?;  left 05-25-2007  '@[facility]'   . POSTERIOR LAMINECTOMY / DECOMPRESSION LUMBAR SPINE  08-31-2016    dr elsner  '@[facility]'   . PROSTATE BIOPSY  04/13/13   gleason 3+3=6, vol 55.4 cc  . ROBOT ASSISTED LAPAROSCOPIC RADICAL PROSTATECTOMY N/A 05/22/2013   Procedure: ROBOTIC ASSISTED LAPAROSCOPIC RADICAL PROSTATECTOMY LEVEL 1;  Surgeon: Dutch Gray, MD;  Location: [facility];  Service: Urology;  Laterality: N/A;  . TONSILLECTOMY      There were no vitals filed for this visit.  Subjective Assessment - 06/22/19 0814    Subjective  Pt. reporting he plans to transition to home program.    Pertinent History  prostate CA, HLD, hay fever, GERD, chronic back pain s/p L3-4 fusion 2018, asthma, R achilles tendon repair, B knee arthroscopy,R finger surgery, cardiac cath 2004, ACDF 2016    Diagnostic tests  no recent imaging available    Patient Stated Goals  "fix my back"    Currently in Pain?  Yes    Pain Score  8  Pain Location  Back    Pain Orientation  Lower    Pain Descriptors / Indicators  Dull    Pain Type  Chronic pain    Pain Radiating Towards  pain into R hip/buttocks and R lateral thigh    Pain Onset  More than a month ago    Pain Frequency  Intermittent    Aggravating Factors   prolonged driving, prolonged standing    Pain Relieving Factors  Heating pad    Multiple Pain Sites  Yes    Pain Score  4    Pain Location  Ankle    Pain Orientation  Right;Medial    Pain Descriptors / Indicators  Constant    Pain Type  Chronic pain    Pain Frequency  Constant    Aggravating Factors   prolonged activity         [facility] PT Assessment - 06/22/19 0001      Assessment   Medical Diagnosis  R lumbar radiculopathy    Referring Provider (PT)  Earleen Newport, MD    Hand Dominance  Right    Next MD Visit  nothing scheduled    Prior Therapy  yes      Observation/Other Assessments   Focus on Therapeutic Outcomes (FOTO)   40% (60% limitation)                    [facility] Adult PT Treatment/Exercise - 06/22/19 0001      Self-Care   Self-Care  Other Self-Care Comments    Other Self-Care Comments   Final HEP review to check for tolerance; pt. encouraged to defer sidelying clam shell as he is not tolerating this activity well       Lumbar Exercises: Aerobic   Nustep  L4 x 6 min (LEs)      Moist Heat Therapy   Number Minutes Moist Heat  15 Minutes    Moist Heat Location  Lumbar Spine      Electrical Stimulation   Electrical Stimulation Location  Lumbar paraspinals and superior buttocks     Electrical Stimulation Action  IFC    Electrical Stimulation Parameters  80-'150Hz' , intensity to pt. tolerance, 15'    Electrical Stimulation Goals  Pain;Tone               PT Short Term Goals - 06/16/19 0848      PT SHORT TERM GOAL #1   Title  Patient to be independent with initial HEP.    Status  Achieved   05/31/19     PT SHORT TERM GOAL #2   Title  Patient will verbalize/demonstrate understanding of neutral spine posture and proper body mechanics to reduce strain on lumbar spine.    Status  Achieved   06/06/19       PT Long Term Goals - 06/22/19 0817      PT LONG TERM GOAL #1   Title  Patient to be independent with advanced/ongoing HEP.    Status  Achieved      PT LONG TERM GOAL #2   Title  Patient to demonstrate appropriate posture and body mechanics needed for daily activities    Status  Achieved   06/16/19     PT LONG TERM GOAL #3   Title  Patient to demonstrate improved tissue quality and pliability with reduced pain.    Status  Partially Met   06/22/19:  Pt. noting reduction in tension in musculature at hips however does still present  with increased tension/tone in buttocks musculature     PT LONG TERM  GOAL #4   Title  Patient to report pain reduction in frequency and intensity by >/= 50%.    Status  Partially Met   06/22/19:  30% improvement in your pain since the beginning of therapy     PT LONG TERM GOAL #5   Title  Patient will report minimal to no sleep disturbance due to pain or muscle cramping.    Status  On-going   06/22/19:  waking up with one or more episodes of cramping and back pain a night           Plan - 06/22/19 0831    Clinical Impression Statement  Pt. reporting he wishes to good on hold from physical therapy as previously discussed with supervising PT in preparation for consult with surgeon to determine if he needs extension of his existing lumbar fusion to adjacent levels above and below.  Pt. has met all STGs and either met or partially met all LTGs in therapy with exception of goal for sleep disturbance and night cramps (LTG #5) which he notes has not improved.  Notes he has adjusted his body mechanics with household tasks with reduction in back pain achieving LTG #2.  Still demonstrating some remaining increased tone/tension in glutes and lumbar paraspinals, despite some improvement in tissue quality, thus LTG #3 only partially achieved.  Notes 30% improvement in back/hip pain since starting therapy even considering recent exacerbation of LBP following excessive stair climbing to access desk upstairs for work from home.  LTG #4 partially achieved.  Pt. verbalizing understanding of ongoing/advanced HEP and plans to continue performance daily for improved lumbar ROM.  Ended visit with E-stim/moist heat to lumbar spine for reduction in pain and tone as pt. noting benefit from these modalities.  Pt. leaving session noting good relief from pain and now on 30-day hold from therapy.    Comorbidities  prostate CA, HLD, hay fever, GERD, chronic back pain s/p L3-4 fusion, asthma, R achilles tendon repair, B knee arthroscopy,R finger surgery, cardiac cath 2004, ACDF 2016    Rehab  Potential  Good    PT Treatment/Interventions  ADLs/Self Care Home Management;Cryotherapy;Electrical Stimulation;Iontophoresis 5m/ml Dexamethasone;Moist Heat;Balance training;Therapeutic exercise;Therapeutic activities;Functional mobility training;Stair training;Gait training;Ultrasound;Neuromuscular re-education;Patient/family education;Manual techniques;Vasopneumatic Device;Taping;Energy conservation;Dry needling;Passive range of motion;Scar mobilization    PT Next Visit Plan  on 30-day hold    PT Home Exercise Plan  06/20/19 - HEP comprehensive: quad, HS & piriformis stretches, bridge + green TB at knees, alternating clam shell with green band, sit to stand (no hands), sidelying clam shell (yellow or red), Standing red TB row, extension row, red TB Pallof press2/10/20 - quad, HS & pirformis stretches, self-STM with rolling pin; 05/22/19 - bridge+green TB at knees,  alternating clam shell with band, sit to stand (no hands), sidelying clam shell (no resistance)    Consulted and Agree with Plan of Care  Patient       Patient will benefit from skilled therapeutic intervention in order to improve the following deficits and impairments:  Decreased activity tolerance, Decreased endurance, Decreased mobility, Decreased range of motion, Decreased strength, Difficulty walking, Increased fascial restricitons, Increased muscle spasms, Impaired perceived functional ability, Impaired flexibility, Impaired sensation, Improper body mechanics, Postural dysfunction, Pain  Visit Diagnosis: Chronic bilateral low back pain with right-sided sciatica  Radiculopathy, lumbosacral region  Muscle spasm of back  Cramp and spasm     Problem List Patient Active Problem List  Diagnosis Date Noted  . Infection, skin 07/26/2018  . Achilles tendon infection 07/26/2018  . Wound dehiscence, surgical 07/10/2018    Class: Acute  . Wound dehiscence, surgical, initial encounter 07/10/2018  . Spondylolisthesis of lumbar  region 08/31/2016  . Neck pain 06/27/2014  . Prostate cancer [facility])     Bess Harvest, PTA 06/22/19 1:06 PM   [facility] [address]  [place], [place], [address] Phone: [phone]   Fax:  [phone]  Name: RYOMA NOFZIGER MRN: [id number] Date of Birth: [birth year]   PHYSICAL THERAPY DISCHARGE SUMMARY  Visits from Start of Care: 11  Current functional level related to goals / functional outcomes:   Refer to above clinical impression for status as of last visit on 06/22/2019. Patient was placed on hold for 30 days and has not needed to return to PT, therefore will proceed with discharge from PT for this episode.   Remaining deficits:   As above.   Education / Equipment:   HEP, [employer] social research officer  Plan: Patient agrees to discharge.  Patient goals were partially met. Patient is being discharged due to being pleased with the current functional level.  ?????    Percival Spanish, PT, MPT 08/09/19, 8:14 AM  [facility] [address]  [place], [place], [address] Phone: [phone]   Fax:  [phone]

## 2019-07-04 DIAGNOSIS — H40013 Open angle with borderline findings, low risk, bilateral: Secondary | ICD-10-CM | POA: Diagnosis not present

## 2019-08-25 DIAGNOSIS — M4316 Spondylolisthesis, lumbar region: Secondary | ICD-10-CM | POA: Diagnosis not present

## 2019-08-25 DIAGNOSIS — H5347 Heteronymous bilateral field defects: Secondary | ICD-10-CM | POA: Diagnosis not present

## 2019-08-25 DIAGNOSIS — M5136 Other intervertebral disc degeneration, lumbar region: Secondary | ICD-10-CM | POA: Diagnosis not present

## 2019-09-11 DIAGNOSIS — M5416 Radiculopathy, lumbar region: Secondary | ICD-10-CM | POA: Diagnosis not present

## 2019-10-31 DIAGNOSIS — I2699 Other pulmonary embolism without acute cor pulmonale: Secondary | ICD-10-CM

## 2019-10-31 DIAGNOSIS — J189 Pneumonia, unspecified organism: Secondary | ICD-10-CM

## 2019-10-31 HISTORY — DX: Other pulmonary embolism without acute cor pulmonale: I26.99

## 2019-10-31 HISTORY — DX: Pneumonia, unspecified organism: J18.9

## 2019-11-02 DIAGNOSIS — H53002 Unspecified amblyopia, left eye: Secondary | ICD-10-CM | POA: Diagnosis not present

## 2019-11-02 DIAGNOSIS — H5340 Unspecified visual field defects: Secondary | ICD-10-CM | POA: Diagnosis not present

## 2019-11-02 DIAGNOSIS — H2513 Age-related nuclear cataract, bilateral: Secondary | ICD-10-CM | POA: Diagnosis not present

## 2019-11-02 DIAGNOSIS — H40013 Open angle with borderline findings, low risk, bilateral: Secondary | ICD-10-CM | POA: Diagnosis not present

## 2019-11-29 DIAGNOSIS — M47816 Spondylosis without myelopathy or radiculopathy, lumbar region: Secondary | ICD-10-CM | POA: Diagnosis not present

## 2019-11-29 DIAGNOSIS — M4316 Spondylolisthesis, lumbar region: Secondary | ICD-10-CM | POA: Diagnosis not present

## 2019-11-29 DIAGNOSIS — M25551 Pain in right hip: Secondary | ICD-10-CM | POA: Diagnosis not present

## 2019-12-11 DIAGNOSIS — M5127 Other intervertebral disc displacement, lumbosacral region: Secondary | ICD-10-CM | POA: Insufficient documentation

## 2019-12-12 DIAGNOSIS — M5116 Intervertebral disc disorders with radiculopathy, lumbar region: Secondary | ICD-10-CM | POA: Diagnosis not present

## 2019-12-12 DIAGNOSIS — M5126 Other intervertebral disc displacement, lumbar region: Secondary | ICD-10-CM | POA: Diagnosis not present

## 2020-01-05 ENCOUNTER — Other Ambulatory Visit: Payer: Self-pay | Admitting: Neurological Surgery

## 2020-01-05 DIAGNOSIS — I10 Essential (primary) hypertension: Secondary | ICD-10-CM | POA: Insufficient documentation

## 2020-01-05 DIAGNOSIS — Z6826 Body mass index (BMI) 26.0-26.9, adult: Secondary | ICD-10-CM | POA: Insufficient documentation

## 2020-01-05 DIAGNOSIS — M5127 Other intervertebral disc displacement, lumbosacral region: Secondary | ICD-10-CM

## 2020-01-08 ENCOUNTER — Ambulatory Visit
Admission: RE | Admit: 2020-01-08 | Discharge: 2020-01-08 | Disposition: A | Discharge status: Home / Self Care | Payer: Medicare Other | Source: Ambulatory Visit | Attending: Neurological Surgery | Admitting: Neurological Surgery

## 2020-01-08 DIAGNOSIS — M4316 Spondylolisthesis, lumbar region: Secondary | ICD-10-CM | POA: Diagnosis not present

## 2020-01-08 DIAGNOSIS — M48061 Spinal stenosis, lumbar region without neurogenic claudication: Secondary | ICD-10-CM | POA: Diagnosis not present

## 2020-01-08 DIAGNOSIS — M47816 Spondylosis without myelopathy or radiculopathy, lumbar region: Secondary | ICD-10-CM | POA: Diagnosis not present

## 2020-01-08 DIAGNOSIS — G9589 Other specified diseases of spinal cord: Secondary | ICD-10-CM | POA: Diagnosis not present

## 2020-01-08 DIAGNOSIS — M5127 Other intervertebral disc displacement, lumbosacral region: Secondary | ICD-10-CM

## 2020-01-08 MED ORDER — GADOBENATE DIMEGLUMINE 529 MG/ML IV SOLN
16.0000 mL | Freq: Once | INTRAVENOUS | Status: AC | PRN
  Administered 2020-01-08: 16 mL via INTRAVENOUS

## 2020-01-12 DIAGNOSIS — M5127 Other intervertebral disc displacement, lumbosacral region: Secondary | ICD-10-CM | POA: Diagnosis not present

## 2020-01-15 ENCOUNTER — Other Ambulatory Visit: Payer: Self-pay | Admitting: Neurological Surgery

## 2020-01-15 DIAGNOSIS — J189 Pneumonia, unspecified organism: Secondary | ICD-10-CM | POA: Diagnosis not present

## 2020-01-15 DIAGNOSIS — R6883 Chills (without fever): Secondary | ICD-10-CM | POA: Diagnosis not present

## 2020-01-15 DIAGNOSIS — R0781 Pleurodynia: Secondary | ICD-10-CM | POA: Diagnosis not present

## 2020-01-15 DIAGNOSIS — R05 Cough: Secondary | ICD-10-CM | POA: Diagnosis not present

## 2020-01-15 DIAGNOSIS — D72829 Elevated white blood cell count, unspecified: Secondary | ICD-10-CM | POA: Diagnosis not present

## 2020-01-16 ENCOUNTER — Encounter (HOSPITAL_BASED_OUTPATIENT_CLINIC_OR_DEPARTMENT_OTHER): Payer: Self-pay

## 2020-01-16 ENCOUNTER — Other Ambulatory Visit (HOSPITAL_BASED_OUTPATIENT_CLINIC_OR_DEPARTMENT_OTHER): Payer: Self-pay | Admitting: Internal Medicine

## 2020-01-16 ENCOUNTER — Other Ambulatory Visit: Payer: Self-pay

## 2020-01-16 ENCOUNTER — Ambulatory Visit (HOSPITAL_BASED_OUTPATIENT_CLINIC_OR_DEPARTMENT_OTHER)
Admission: RE | Admit: 2020-01-16 | Discharge: 2020-01-16 | Disposition: A | Discharge status: Home / Self Care | Payer: Medicare Other | Source: Ambulatory Visit | Attending: Internal Medicine | Admitting: Internal Medicine

## 2020-01-16 DIAGNOSIS — J189 Pneumonia, unspecified organism: Secondary | ICD-10-CM

## 2020-01-16 DIAGNOSIS — E785 Hyperlipidemia, unspecified: Secondary | ICD-10-CM | POA: Diagnosis not present

## 2020-01-16 DIAGNOSIS — R0781 Pleurodynia: Secondary | ICD-10-CM | POA: Diagnosis not present

## 2020-01-16 DIAGNOSIS — I2693 Single subsegmental pulmonary embolism without acute cor pulmonale: Secondary | ICD-10-CM | POA: Diagnosis not present

## 2020-01-16 DIAGNOSIS — R0602 Shortness of breath: Secondary | ICD-10-CM | POA: Diagnosis not present

## 2020-01-16 MED ORDER — IOHEXOL 350 MG/ML SOLN
100.0000 mL | Freq: Once | INTRAVENOUS | Status: AC | PRN
  Administered 2020-01-16: 75 mL via INTRAVENOUS

## 2020-02-08 DIAGNOSIS — E785 Hyperlipidemia, unspecified: Secondary | ICD-10-CM | POA: Diagnosis not present

## 2020-02-08 DIAGNOSIS — R739 Hyperglycemia, unspecified: Secondary | ICD-10-CM | POA: Diagnosis not present

## 2020-02-08 DIAGNOSIS — Z125 Encounter for screening for malignant neoplasm of prostate: Secondary | ICD-10-CM | POA: Diagnosis not present

## 2020-02-13 DIAGNOSIS — Z23 Encounter for immunization: Secondary | ICD-10-CM | POA: Diagnosis not present

## 2020-02-15 ENCOUNTER — Other Ambulatory Visit: Payer: Self-pay

## 2020-02-15 ENCOUNTER — Inpatient Hospital Stay: Admit: 2020-02-15 | Payer: Medicare Other | Admitting: Neurological Surgery

## 2020-02-15 ENCOUNTER — Encounter: Payer: Self-pay | Admitting: Physical Therapy

## 2020-02-15 ENCOUNTER — Ambulatory Visit: Payer: Medicare Other | Attending: Neurological Surgery | Admitting: Physical Therapy

## 2020-02-15 DIAGNOSIS — M5417 Radiculopathy, lumbosacral region: Secondary | ICD-10-CM | POA: Diagnosis not present

## 2020-02-15 DIAGNOSIS — R262 Difficulty in walking, not elsewhere classified: Secondary | ICD-10-CM | POA: Insufficient documentation

## 2020-02-15 DIAGNOSIS — E785 Hyperlipidemia, unspecified: Secondary | ICD-10-CM | POA: Diagnosis not present

## 2020-02-15 DIAGNOSIS — M62830 Muscle spasm of back: Secondary | ICD-10-CM

## 2020-02-15 DIAGNOSIS — M5442 Lumbago with sciatica, left side: Secondary | ICD-10-CM | POA: Diagnosis not present

## 2020-02-15 DIAGNOSIS — D692 Other nonthrombocytopenic purpura: Secondary | ICD-10-CM | POA: Diagnosis not present

## 2020-02-15 DIAGNOSIS — M6281 Muscle weakness (generalized): Secondary | ICD-10-CM | POA: Diagnosis not present

## 2020-02-15 DIAGNOSIS — R2689 Other abnormalities of gait and mobility: Secondary | ICD-10-CM

## 2020-02-15 DIAGNOSIS — M5441 Lumbago with sciatica, right side: Secondary | ICD-10-CM | POA: Insufficient documentation

## 2020-02-15 DIAGNOSIS — R82998 Other abnormal findings in urine: Secondary | ICD-10-CM | POA: Diagnosis not present

## 2020-02-15 DIAGNOSIS — J189 Pneumonia, unspecified organism: Secondary | ICD-10-CM | POA: Diagnosis not present

## 2020-02-15 DIAGNOSIS — R739 Hyperglycemia, unspecified: Secondary | ICD-10-CM | POA: Diagnosis not present

## 2020-02-15 DIAGNOSIS — M25571 Pain in right ankle and joints of right foot: Secondary | ICD-10-CM | POA: Diagnosis not present

## 2020-02-15 DIAGNOSIS — G8929 Other chronic pain: Secondary | ICD-10-CM | POA: Insufficient documentation

## 2020-02-15 DIAGNOSIS — I2693 Single subsegmental pulmonary embolism without acute cor pulmonale: Secondary | ICD-10-CM | POA: Diagnosis not present

## 2020-02-15 DIAGNOSIS — M545 Low back pain: Secondary | ICD-10-CM | POA: Diagnosis not present

## 2020-02-15 DIAGNOSIS — K219 Gastro-esophageal reflux disease without esophagitis: Secondary | ICD-10-CM | POA: Diagnosis not present

## 2020-02-15 DIAGNOSIS — C61 Malignant neoplasm of prostate: Secondary | ICD-10-CM | POA: Diagnosis not present

## 2020-02-15 DIAGNOSIS — Z0000 Encounter for general adult medical examination without abnormal findings: Secondary | ICD-10-CM | POA: Diagnosis not present

## 2020-02-15 DIAGNOSIS — F5221 Male erectile disorder: Secondary | ICD-10-CM | POA: Diagnosis not present

## 2020-02-15 SURGERY — POSTERIOR LUMBAR FUSION 1 LEVEL
Anesthesia: General | Site: Back

## 2020-02-15 NOTE — Patient Instructions (Signed)
    Home exercise program created by Masai Kidd, PT.  For questions, please contact Vona Whiters via phone at [phone] or email at [email or web address]  [facility] [address]  [address] [place], [place], [address] Phone: [phone]   Fax:  [phone]    

## 2020-02-15 NOTE — Therapy (Signed)
[facility] [address]  [place], [place], [address] Phone: [phone]   Fax:  [phone]  Physical Therapy Evaluation  Patient Details  Name: Terry [name] MRN: [id number] Date of Birth: 11-13-1950 Referring Provider (PT): Kristeen Miss, MD   Encounter Date: 02/15/2020   PT End of Session - 02/15/20 0808    Visit Number 1    Number of Visits 6    Date for PT Re-Evaluation 03/28/20    Authorization Type Medicare & BCBS    PT Start Time 0808    PT Stop Time 0915    PT Time Calculation (min) 67 min    Activity Tolerance Patient tolerated treatment well    Behavior During Therapy [facility] for tasks assessed/performed           Past Medical History:  Diagnosis Date   Arthritis    neck, back, knees, right ankle   Bladder calculus    Chronic back pain    Complication of anesthesia    dizziness and "crazy amount of bile"   GERD (gastroesophageal reflux disease)    History of colon polyps    benign   History of prostate cancer urologist-- dr Alinda Money   dx 2014---  s/p  prostatectomy 05-22-2013 ,  Gleason 3+3   Hypercholesterolemia    takes Crestor daily   Hyperlipidemia    Wears glasses     Past Surgical History:  Procedure Laterality Date   ACHILLES TENDON SURGERY Right 06-07-2018   @[facility]    reconstruction   ANTERIOR CERVICAL DECOMP/DISCECTOMY FUSION N/A 06/27/2014   Procedure: ANTERIOR CERVICAL DECOMPRESSION/DISCECTOMY FUSION C5-C7   (2 LEVELS);  Surgeon: Melina Schools, MD;  Location: [facility];  Service: Orthopedics;  Laterality: N/A;   CARDIAC CATHETERIZATION  2004   COLONOSCOPY     CYSTOSCOPY WITH LITHOLAPAXY N/A 03/27/2019   Procedure: CYSTOSCOPY WITH REMOVAL OF BLADDER STONE AND FOREIGN BODY;  Surgeon: Raynelle Bring, MD;  Location: [facility];  Service: Urology;  Laterality: N/A;   EYE SURGERY     lazy eye as child, lasik surgery   FINGER SURGERY Right    4th finger   I  & D EXTREMITY Right 07/10/2018   Procedure: IRRIGATION AND DEBRIDEMENT RIGHT ANKLE WOUND AND WOUND VAC PLACEMENT;  Surgeon: Wylene Simmer, MD;  Location: [facility];  Service: Orthopedics;  Laterality: Right;   KNEE ARTHROSCOPY Bilateral right ?;  left 05-25-2007  @[facility]    POSTERIOR LAMINECTOMY / DECOMPRESSION LUMBAR SPINE  08-31-2016    dr elsner  @[facility]    PROSTATE BIOPSY  04/13/13   gleason 3+3=6, vol 55.4 cc   ROBOT ASSISTED LAPAROSCOPIC RADICAL PROSTATECTOMY N/A 05/22/2013   Procedure: ROBOTIC ASSISTED LAPAROSCOPIC RADICAL PROSTATECTOMY LEVEL 1;  Surgeon: Dutch Gray, MD;  Location: [facility];  Service: Urology;  Laterality: N/A;   TONSILLECTOMY      There were no vitals filed for this visit.    Subjective Assessment - 02/15/20 0815    Subjective Pt reports in late June he got a new puppy and when he woke up the next day he couldnt move his R leg (locked up). Found to have a .6 shift of vertebrae and had surgery to removal L5 disc with hardware placed. A week later he felt a pop is his back and it was determined that the hardware had broken.  He was scheduled to have another back surgery today but developed a DVT and PE as well as double pneumonia and has to  wait for ~90 days to be cleared for surgery.  L4-5 posterior lumbar interbody fusion planned.    Pertinent History chronic back pain s/p L3-4 fusion 2018 and L5 discectomy & L4-5 fusion? ~11/2019 with hardware failure requiring another surgery pending surgical clearance following PE and B pneumonia, ACDF 2016, prostate CA, HLD, hay fever, GERD, asthma, R Achilles tendon repair, B knee arthroscopy, R finger surgery, cardiac cath 2004    Limitations Sitting;Standing;Walking;Lifting;House hold activities    How long can you sit comfortably? <15 minutes in standard chair; 45 minutes in recliner    How long can you stand comfortably? 20 minutes    How long can you walk comfortably? 1/3 mile - 20 minutes    Diagnostic tests Lumbar MRI 01/08/20: 1.  Suspected interval posterior decompression on the right at L4-5with improved patency of the spinal canal. There is a persistentextruded disc fragment extending cephalad into the right L4 lateralrecess with persistent mass effect on the right L4 nerve root. 2. Stable chronic findings at L2-3 with moderate multifactorial spinal stenosis and mild foraminal narrowing bilaterally. 3. The spinal canal and foramina remain well decompressed post PLIFat L3-4.  CT angiogram 01/16/20: 1. Right lower lobe segmental pulmonary artery embolus. No CTevidence of right heart straining. 2. Bibasilar patchy and streaky densities may represent atelectasis although pneumonia is not excluded. A pulmonary infarct at the rightlung base is less likely. 3. Aortic Atherosclerosis (ICD10-I70.0).    Patient Stated Goals "to alleviate all of my pain" & "keep my muscle structure up to par to help me get ready for my next surgery"    Currently in Pain? Yes    Pain Score 6    up to 9/10 at worst   Pain Location Back    Pain Orientation Lower    Pain Descriptors / Indicators Stabbing    Pain Type Chronic pain    Pain Radiating Towards pain and numbness into buttock and down back of legs to knee, tingling along inner thighs R>L    Pain Onset 1 to 4 weeks ago   chronic for several years but worsening over past 3-4 weeks   Pain Frequency Constant    Aggravating Factors  bending or twisting the wrong way, sitting, walking    Pain Relieving Factors heating pad, hot showers, walking stretches him out    Effect of Pain on Daily Activities walks more guarded and slowly, interferes with sleep, very stiff upon rising in the morning              [facility] PT Assessment - 02/15/20 0808      Assessment   Medical Diagnosis Lumbar spondylolithesis    Referring Provider (PT) Kristeen Miss, MD    Onset Date/Surgical Date --   late June   Next MD Visit TBD after he sees PCP today    Prior Therapy prior PT for LBP      Precautions   Precautions  None      Restrictions   Weight Bearing Restrictions No      Balance Screen   Has the patient fallen in the past 6 months No    Has the patient had a decrease in activity level because of a fear of falling?  No    Is the patient reluctant to leave their home because of a fear of falling?  No      Home Environment   Living Environment Private residence    Living Arrangements Spouse/significant other    Available Help at Discharge  Family    Type of [place] Access Level entry    [place] Crutches;Walker - 2 wheels      Prior Function   Level of [place] Retired    Leisure play golf & tennis, run, play with grandchildren      Cognition   Overall Cognitive Status Within Functional Limits for tasks assessed      Observation/Other Assessments   Focus on Therapeutic Outcomes (FOTO)  Lumbar - 47% (53% limitation); Predicted 55% (45% limitation)      ROM / Strength   AROM / PROM / Strength AROM;Strength      AROM   Overall AROM  Deficits;Due to pain    Overall AROM Comments pain with all motions    AROM Assessment Site Lumbar    Lumbar Flexion fingertips to toes    Lumbar Extension 75% limited    Lumbar - Right Side Bend hand to lateral knee    Lumbar - Left Side Bend hand to lateral knee    Lumbar - Right Rotation 60% limited    Lumbar - Left Rotation 40% limited      Strength   Strength Assessment Site Hip;Knee;Ankle    Right/Left Hip Right;Left    Right Hip Flexion 4/5    Right Hip Extension 3+/5    Right Hip External Rotation  4-/5    Right Hip Internal Rotation 4+/5    Right Hip ABduction 4-/5    Right Hip ADduction 3+/5    Left Hip Flexion 4/5    Left Hip Extension 4-/5    Left Hip External Rotation 4/5    Left Hip Internal Rotation 4+/5    Left Hip ABduction 4/5    Left Hip ADduction 4-/5    Right/Left Knee Right;Left    Right Knee Flexion 4/5    Right Knee Extension 4+/5    Left Knee  Flexion 4+/5    Left Knee Extension 4+/5    Right/Left Ankle Right;Left    Right Ankle Dorsiflexion 4-/5    Right Ankle Plantar Flexion 3-/5    Left Ankle Dorsiflexion 4+/5    Left Ankle Plantar Flexion 3-/5      Flexibility   Soft Tissue Assessment /Muscle Length yes    Hamstrings very mild tight B    Quadriceps mod tight B    ITB mod tight B    Piriformis mild tight B      Ambulation/Gait   Assistive device None    Gait Pattern Step-through pattern;Decreased stride length;Wide base of support;Decreased trunk rotation;Decreased hip/knee flexion - right;Decreased hip/knee flexion - left    Ambulation Surface Level;Indoor                      Objective measurements completed on examination: See above findings.       [place] Adult PT Treatment/Exercise - 02/15/20 0808      Exercises   Exercises Lumbar      Lumbar Exercises: Stretches   Single Knee to Chest Stretch Right;Left;30 seconds;1 rep    Hip Flexor Stretch Right;Left;30 seconds;1 rep    Hip Flexor Stretch Limitations seated lunge position over edge of chair    ITB Stretch Right;Left;30 seconds;1 rep    ITB Stretch Limitations supine crossbody with strap    Piriformis Stretch Right;Left;30 seconds;1 rep    Piriformis Stretch Limitations supine KTOS with opp LE straight  PT Education - 02/15/20 0915    Education Details PT eval findings, anticipated POC and initial HEP    Person(s) Educated Patient    Methods Explanation;Demonstration;Verbal cues;Handout    Comprehension Verbalized understanding;Verbal cues required;Returned demonstration;Need further instruction               PT Long Term Goals - 02/15/20 0915      PT LONG TERM GOAL #1   Title Patient to be independent with advanced/ongoing HEP.    Status New    Target Date 03/28/20      PT LONG TERM GOAL #2   Title Patient to demonstrate appropriate posture and body mechanics needed for daily activities    Status  New    Target Date 03/28/20      PT LONG TERM GOAL #3   Title Patient to demonstrate improved tissue quality as noted by reduced tissue tightness and tenderness to palpation    Status New    Target Date 03/28/20      PT LONG TERM GOAL #4   Title Patient will demonstrate improved B hip and knee strength to >/= 4/5 to 4+/5 for improved stability and ease of mobility    Status New    Target Date 03/28/20      PT LONG TERM GOAL #5   Title Patient to report pain reduction in frequency and intensity by >/= 30-50%    Status New    Target Date 03/28/20                  Plan - 02/15/20 0915    Clinical Impression Statement 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 History remarkable for L3-4 decompressive laminectomy decompression of L3 and L4 nerve roots, posterior lumbar interbody arthrodesis, pedicle screw fixation L3-4 and posterior lateral arthrodesis L3-4 on 08/31/16 and L 5 discectomy with L4-5 fusion ~11/2019 (recent surgery details per pt description - no operative report available). Patient reports hardware failure from latest surgery requiring additional surgery which was to be completed today but currently on hold until cleared from recent PE and B pneumonia. Pain currently 6/10 centered near R SIJ with radicular pain and numbness into R>L buttocks and down back of legs to knee as well as tingling along inner thighs. He denies bowel and bladder changes. Assessment reveals ttp with increased muscle tension throughout B lumbar paraspinals and glutes/piriformis (R>L), limited lumbar AROM, decreased proximal LE flexibility, core and mild proximal LE weakness, and abnormal gait. Pain and muscle cramping interfere with sleep along with positional and activity tolerance. Rakim will benefit from skilled PT services to address above deficits and allow for increased participation in desired activities  with decreased pain interference.    Personal Factors and Comorbidities Comorbidity 3+;Time since onset of injury/illness/exacerbation;Past/Current Experience;Fitness    Comorbidities chronic back pain s/p L3-4 fusion 2018 and L5 discectomy & L4-5 fusion? ~11/2019 with hardware failure requiring another surgery pending surgical clearance following PE and B pneumonia, ACDF 2016, prostate CA, HLD, hay fever, GERD, asthma, R Achilles tendon repair, B knee arthroscopy, R finger surgery, cardiac cath 2004    Examination-Activity Limitations Bed Mobility;Caring for Others;Lift;Carry;Locomotion Level;Reach Overhead;Sit;Sleep;Squat;Stairs;Stand;Transfers    Examination-Participation Restrictions Church;Cleaning;Shop;Community Activity;Volunteer;Driving;Yard Work;Interpersonal Relationship;Laundry;Meal Prep    Stability/Clinical Decision Making Evolving/Moderate complexity    Clinical Decision Making Moderate    Rehab Potential Good    PT Frequency 1x / week  PT Duration 6 weeks    PT Treatment/Interventions ADLs/Self Care Home Management;Cryotherapy;Electrical Stimulation;Iontophoresis 4mg /ml Dexamethasone;Moist Heat;Balance training;Therapeutic exercise;Therapeutic activities;Functional mobility training;Stair training;Gait training;Ultrasound;Neuromuscular re-education;Patient/family education;Manual techniques;Vasopneumatic Device;Taping;Energy conservation;Dry needling;Passive range of motion    PT Next Visit Plan Review ongoing HEP from prior therapy & initial HEP from eval; lumbopelvic flexibility and strengthening; manual therapy including DN as indicated    PT Home Exercise Plan 9/16 - ITB, piriformis & hip flexor stretches    Consulted and Agree with Plan of Care Patient           Patient will benefit from skilled therapeutic intervention in order to improve the following deficits and impairments:  Abnormal gait, Decreased activity tolerance, Decreased endurance, Decreased mobility, Decreased  range of motion, Decreased strength, Difficulty walking, Increased fascial restricitons, Increased muscle spasms, Impaired perceived functional ability, Impaired flexibility, Impaired sensation, Impaired tone, Improper body mechanics, Postural dysfunction, Pain  Visit Diagnosis: Chronic bilateral low back pain with bilateral sciatica  Radiculopathy, lumbosacral region  Muscle spasm of back  Muscle weakness (generalized)  Difficulty in walking, not elsewhere classified  Other abnormalities of gait and mobility     Problem List Patient Active Problem List   Diagnosis Date Noted   Infection, skin 07/26/2018   Achilles tendon infection 07/26/2018   Wound dehiscence, surgical 07/10/2018    Class: Acute   Wound dehiscence, surgical, initial encounter 07/10/2018   Spondylolisthesis of lumbar region 08/31/2016   Neck pain 06/27/2014   Prostate cancer ([place])     Percival Spanish, PT, MPT 02/15/2020, 1:33 PM  [facility] [address]  [place], [place], [address] Phone: [phone]   Fax:  [phone]  Name: JHAMARI MARKOWICZ MRN: [id number] Date of Birth: 1951/04/25

## 2020-02-16 ENCOUNTER — Other Ambulatory Visit: Payer: Self-pay | Admitting: Neurological Surgery

## 2020-02-26 ENCOUNTER — Other Ambulatory Visit: Payer: Self-pay

## 2020-02-26 ENCOUNTER — Ambulatory Visit: Payer: Medicare Other

## 2020-02-26 DIAGNOSIS — M62830 Muscle spasm of back: Secondary | ICD-10-CM | POA: Diagnosis not present

## 2020-02-26 DIAGNOSIS — M5417 Radiculopathy, lumbosacral region: Secondary | ICD-10-CM

## 2020-02-26 DIAGNOSIS — G8929 Other chronic pain: Secondary | ICD-10-CM | POA: Diagnosis not present

## 2020-02-26 DIAGNOSIS — M6281 Muscle weakness (generalized): Secondary | ICD-10-CM | POA: Diagnosis not present

## 2020-02-26 DIAGNOSIS — R2689 Other abnormalities of gait and mobility: Secondary | ICD-10-CM

## 2020-02-26 DIAGNOSIS — M5441 Lumbago with sciatica, right side: Secondary | ICD-10-CM | POA: Diagnosis not present

## 2020-02-26 DIAGNOSIS — R262 Difficulty in walking, not elsewhere classified: Secondary | ICD-10-CM

## 2020-02-26 DIAGNOSIS — M5442 Lumbago with sciatica, left side: Secondary | ICD-10-CM | POA: Diagnosis not present

## 2020-02-26 NOTE — Therapy (Signed)
[facility] [address]  [place], [place], [address] Phone: [phone]   Fax:  [phone]  Physical Therapy Treatment  Patient Details  Name: Terry [name] MRN: [id number] Date of Birth: February 27, 1951 Referring Provider (PT): Kristeen Miss, MD   Encounter Date: 02/26/2020   PT End of Session - 02/26/20 0858    Visit Number 2    Number of Visits 6    Date for PT Re-Evaluation 03/28/20    Authorization Type Medicare & BCBS    PT Start Time 0849    PT Stop Time 0955    PT Time Calculation (min) 66 min    Activity Tolerance Patient tolerated treatment well    Behavior During Therapy [facility] for tasks assessed/performed           Past Medical History:  Diagnosis Date  . Arthritis    neck, back, knees, right ankle  . Bladder calculus   . Chronic back pain   . Complication of anesthesia    dizziness and "crazy amount of bile"  . GERD (gastroesophageal reflux disease)   . History of colon polyps    benign  . History of prostate cancer urologist-- dr Alinda Money   dx 2014---  s/p  prostatectomy 05-22-2013 ,  Gleason 3+3  . Hypercholesterolemia    takes Crestor daily  . Hyperlipidemia   . Wears glasses     Past Surgical History:  Procedure Laterality Date  . ACHILLES TENDON SURGERY Right 06-07-2018   @[facility]    reconstruction  . ANTERIOR CERVICAL DECOMP/DISCECTOMY FUSION N/A 06/27/2014   Procedure: ANTERIOR CERVICAL DECOMPRESSION/DISCECTOMY FUSION C5-C7   (2 LEVELS);  Surgeon: Melina Schools, MD;  Location: [place];  Service: Orthopedics;  Laterality: N/A;  . CARDIAC CATHETERIZATION  2004  . COLONOSCOPY    . CYSTOSCOPY WITH LITHOLAPAXY N/A 03/27/2019   Procedure: CYSTOSCOPY WITH REMOVAL OF BLADDER STONE AND FOREIGN BODY;  Surgeon: Raynelle Bring, MD;  Location: [facility];  Service: Urology;  Laterality: N/A;  . EYE SURGERY     lazy eye as child, lasik surgery  . FINGER SURGERY Right    4th finger  . I  & D EXTREMITY Right 07/10/2018   Procedure: IRRIGATION AND DEBRIDEMENT RIGHT ANKLE WOUND AND WOUND VAC PLACEMENT;  Surgeon: Wylene Simmer, MD;  Location: [place];  Service: Orthopedics;  Laterality: Right;  . KNEE ARTHROSCOPY Bilateral right ?;  left 05-25-2007  @[facility]   . POSTERIOR LAMINECTOMY / DECOMPRESSION LUMBAR SPINE  08-31-2016    dr elsner  @[facility]   . PROSTATE BIOPSY  04/13/13   gleason 3+3=6, vol 55.4 cc  . ROBOT ASSISTED LAPAROSCOPIC RADICAL PROSTATECTOMY N/A 05/22/2013   Procedure: ROBOTIC ASSISTED LAPAROSCOPIC RADICAL PROSTATECTOMY LEVEL 1;  Surgeon: Dutch Gray, MD;  Location: [facility];  Service: Urology;  Laterality: N/A;  . TONSILLECTOMY      There were no vitals filed for this visit.   Subjective Assessment - 02/26/20 0858    Subjective Pt. reporting HEP exercises irritated his back thus he stopped performing these.  had a physical last thursday and MD feels blood clot has "dissolved".    Pertinent History chronic back pain s/p L3-4 fusion 2018 and L5 discectomy & L4-5 fusion? ~11/2019 with hardware failure requiring another surgery pending surgical clearance following PE and B pneumonia, ACDF 2016, prostate CA, HLD, hay fever, GERD, asthma, R Achilles tendon repair, B knee arthroscopy, R finger surgery, cardiac cath 2004    Diagnostic tests Lumbar MRI 01/08/20:  1. Suspected interval posterior decompression on the right at L4-5with improved patency of the spinal canal. There is a persistentextruded disc fragment extending cephalad into the right L4 lateralrecess with persistent mass effect on the right L4 nerve root. 2. Stable chronic findings at L2-3 with moderate multifactorial spinal stenosis and mild foraminal narrowing bilaterally. 3. The spinal canal and foramina remain well decompressed post PLIFat L3-4.  CT angiogram 01/16/20: 1. Right lower lobe segmental pulmonary artery embolus. No CTevidence of right heart straining. 2. Bibasilar patchy and streaky densities may represent atelectasis  although pneumonia is not excluded. A pulmonary infarct at the rightlung base is less likely. 3. Aortic Atherosclerosis (ICD10-I70.0).    Patient Stated Goals "to alleviate all of my pain" & "keep my muscle structure up to par to help me get ready for my next surgery"    Currently in Pain? Yes    Pain Score 8     Pain Location Back    Pain Descriptors / Indicators Stabbing    Pain Type Chronic pain    Pain Radiating Towards Pain and numbness into buttocks and down back of legs to knee    Pain Frequency Constant    Multiple Pain Sites No                             [facility] Adult PT Treatment/Exercise - 02/26/20 0001      Lumbar Exercises: Stretches   Single Knee to Chest Stretch Right;Left;30 seconds;1 rep    Single Knee to Chest Stretch Limitations B    Hip Flexor Stretch Right;Left;30 seconds;1 rep   Heavy cueing for position for appropriate stretch    Hip Flexor Stretch Limitations seated lunge position over edge of chair    ITB Stretch Right;Left;30 seconds;1 rep   cueing required for proper angle of pull   ITB Stretch Limitations supine crossbody with strap   cues to avoid using hip flexors to hold leg elevated    Piriformis Stretch Right;Left;30 seconds;1 rep    Piriformis Stretch Limitations KTOS      Lumbar Exercises: Aerobic   Nustep Lvl 3, 6 min (LE/UE)      Lumbar Exercises: Supine   Ab Set 10 reps;5 seconds    AB Set Limitations + adduction pillow squeeze    cues to avoid holding breath      Lumbar Exercises: Sidelying   Clam Right;Left;10 reps    Clam Limitations cues for positioning; pillow under knee       Moist Heat Therapy   Number Minutes Moist Heat 15 Minutes    Moist Heat Location Lumbar Spine      Electrical Stimulation   Electrical Stimulation Location R-sided lumbar spine     Electrical Stimulation Action IFC    Electrical Stimulation Parameters 80-150Hz , intensity to pt. tolerance, 15'    Electrical Stimulation Goals Pain       Manual Therapy   Manual Therapy Soft tissue mobilization    Manual therapy comments sidelying     Soft tissue mobilization STM/DTM to R buttocks/piriformis                        PT Long Term Goals - 02/26/20 0900      PT LONG TERM GOAL #1   Title Patient to be independent with advanced/ongoing HEP.    Status On-going      PT LONG TERM GOAL #2   Title Patient to  demonstrate appropriate posture and body mechanics needed for daily activities    Status On-going      PT LONG TERM GOAL #3   Title Patient to demonstrate improved tissue quality as noted by reduced tissue tightness and tenderness to palpation    Status On-going      PT LONG TERM GOAL #4   Title Patient will demonstrate improved B hip and knee strength to >/= 4/5 to 4+/5 for improved stability and ease of mobility    Status On-going      PT LONG TERM GOAL #5   Title Patient to report pain reduction in frequency and intensity by >/= 30-50%    Status On-going                 Plan - 02/26/20 0916    Clinical Impression Statement Pt. reporting that he had a physical last Thursday and primary care physician feels that his blood clot has "dissolved".  Has been having some back soreness after performing initial HEP thus reviewed HEP to check for proper technique.  Did have to make an adjustment to angle of pull with supine ITB stretch and to avoid over aggressive stretch with seated hip flexor stretch to avoid back pain today with improved tolerance for both stretches following instruction.  Pt. able to progress to basic lumbopelvic strengthening without increased LBP.  Did have low-level LBP to end session thus applied trial of moist heat/E-stim. to lumbar spine in hooklying with good relief noted from patient leaving session.  Will plan to review latter half of previous episode's PT HEP in coming session to check for proper understanding/technique.    Comorbidities chronic back pain s/p L3-4 fusion 2018 and L5  discectomy & L4-5 fusion? ~11/2019 with hardware failure requiring another surgery pending surgical clearance following PE and B pneumonia, ACDF 2016, prostate CA, HLD, hay fever, GERD, asthma, R Achilles tendon repair, B knee arthroscopy, R finger surgery, cardiac cath 2004    Rehab Potential Good    PT Frequency 1x / week    PT Duration 6 weeks    PT Treatment/Interventions ADLs/Self Care Home Management;Cryotherapy;Electrical Stimulation;Iontophoresis 4mg /ml Dexamethasone;Moist Heat;Balance training;Therapeutic exercise;Therapeutic activities;Functional mobility training;Stair training;Gait training;Ultrasound;Neuromuscular re-education;Patient/family education;Manual techniques;Vasopneumatic Device;Taping;Energy conservation;Dry needling;Passive range of motion    PT Next Visit Plan Finish HEP review from prior therapy; lumbopelvic flexibility and strengthening; manual therapy including DN as indicated    PT Home Exercise Plan 9/16 - ITB, piriformis & hip flexor stretches    Consulted and Agree with Plan of Care Patient           Patient will benefit from skilled therapeutic intervention in order to improve the following deficits and impairments:  Abnormal gait, Decreased activity tolerance, Decreased endurance, Decreased mobility, Decreased range of motion, Decreased strength, Difficulty walking, Increased fascial restricitons, Increased muscle spasms, Impaired perceived functional ability, Impaired flexibility, Impaired sensation, Impaired tone, Improper body mechanics, Postural dysfunction, Pain  Visit Diagnosis: Chronic bilateral low back pain with bilateral sciatica  Radiculopathy, lumbosacral region  Muscle spasm of back  Muscle weakness (generalized)  Difficulty in walking, not elsewhere classified  Other abnormalities of gait and mobility     Problem List Patient Active Problem List   Diagnosis Date Noted  . Infection, skin 07/26/2018  . Achilles tendon infection  07/26/2018  . Wound dehiscence, surgical 07/10/2018    Class: Acute  . Wound dehiscence, surgical, initial encounter 07/10/2018  . Spondylolisthesis of lumbar region 08/31/2016  . Neck pain 06/27/2014  . Prostate  cancer [facility])     Bess Harvest, PTA 02/26/20 10:12 AM    [facility] [address]  [place], [place], [address] Phone: [phone]   Fax:  [phone]  Name: BOOKER BHATNAGAR MRN: [id number] Date of Birth: 1951/04/23

## 2020-03-01 ENCOUNTER — Encounter: Payer: Medicare Other | Admitting: Physical Therapy

## 2020-03-05 ENCOUNTER — Other Ambulatory Visit: Payer: Self-pay

## 2020-03-05 ENCOUNTER — Encounter: Payer: Self-pay | Admitting: Physical Therapy

## 2020-03-05 ENCOUNTER — Ambulatory Visit: Payer: Medicare Other | Attending: Neurological Surgery | Admitting: Physical Therapy

## 2020-03-05 DIAGNOSIS — M5417 Radiculopathy, lumbosacral region: Secondary | ICD-10-CM | POA: Diagnosis not present

## 2020-03-05 DIAGNOSIS — M6281 Muscle weakness (generalized): Secondary | ICD-10-CM | POA: Insufficient documentation

## 2020-03-05 DIAGNOSIS — G8929 Other chronic pain: Secondary | ICD-10-CM | POA: Insufficient documentation

## 2020-03-05 DIAGNOSIS — M5442 Lumbago with sciatica, left side: Secondary | ICD-10-CM | POA: Insufficient documentation

## 2020-03-05 DIAGNOSIS — M5441 Lumbago with sciatica, right side: Secondary | ICD-10-CM | POA: Diagnosis not present

## 2020-03-05 DIAGNOSIS — M62830 Muscle spasm of back: Secondary | ICD-10-CM | POA: Insufficient documentation

## 2020-03-05 DIAGNOSIS — R262 Difficulty in walking, not elsewhere classified: Secondary | ICD-10-CM | POA: Insufficient documentation

## 2020-03-05 DIAGNOSIS — R2689 Other abnormalities of gait and mobility: Secondary | ICD-10-CM | POA: Diagnosis not present

## 2020-03-05 NOTE — Therapy (Signed)
[facility] [address]  [place], [place], [address] Phone: [phone]   Fax:  [phone]  Physical Therapy Treatment  Patient Details  Name: Terry [name] MRN: [id number] Date of Birth: 1950-10-18 Referring Provider (PT): Kristeen Miss, MD   Encounter Date: 03/05/2020   PT End of Session - 03/05/20 0803    Visit Number 3    Number of Visits 6    Date for PT Re-Evaluation 03/28/20    Authorization Type Medicare & BCBS    PT Start Time 0803    PT Stop Time 0900    PT Time Calculation (min) 57 min    Activity Tolerance Patient tolerated treatment well    Behavior During Therapy [facility] for tasks assessed/performed           Past Medical History:  Diagnosis Date  . Arthritis    neck, back, knees, right ankle  . Bladder calculus   . Chronic back pain   . Complication of anesthesia    dizziness and "crazy amount of bile"  . GERD (gastroesophageal reflux disease)   . History of colon polyps    benign  . History of prostate cancer urologist-- dr Alinda Money   dx 2014---  s/p  prostatectomy 05-22-2013 ,  Gleason 3+3  . Hypercholesterolemia    takes Crestor daily  . Hyperlipidemia   . Wears glasses     Past Surgical History:  Procedure Laterality Date  . ACHILLES TENDON SURGERY Right 06-07-2018   @[facility]    reconstruction  . ANTERIOR CERVICAL DECOMP/DISCECTOMY FUSION N/A 06/27/2014   Procedure: ANTERIOR CERVICAL DECOMPRESSION/DISCECTOMY FUSION C5-C7   (2 LEVELS);  Surgeon: Melina Schools, MD;  Location: [place];  Service: Orthopedics;  Laterality: N/A;  . CARDIAC CATHETERIZATION  2004  . COLONOSCOPY    . CYSTOSCOPY WITH LITHOLAPAXY N/A 03/27/2019   Procedure: CYSTOSCOPY WITH REMOVAL OF BLADDER STONE AND FOREIGN BODY;  Surgeon: Raynelle Bring, MD;  Location: [facility];  Service: Urology;  Laterality: N/A;  . EYE SURGERY     lazy eye as child, lasik surgery  . FINGER SURGERY Right    4th finger  . I  & D EXTREMITY Right 07/10/2018   Procedure: IRRIGATION AND DEBRIDEMENT RIGHT ANKLE WOUND AND WOUND VAC PLACEMENT;  Surgeon: Wylene Simmer, MD;  Location: [facility];  Service: Orthopedics;  Laterality: Right;  . KNEE ARTHROSCOPY Bilateral right ?;  left 05-25-2007  @[facility]   . POSTERIOR LAMINECTOMY / DECOMPRESSION LUMBAR SPINE  08-31-2016    dr elsner  @[facility]   . PROSTATE BIOPSY  04/13/13   gleason 3+3=6, vol 55.4 cc  . ROBOT ASSISTED LAPAROSCOPIC RADICAL PROSTATECTOMY N/A 05/22/2013   Procedure: ROBOTIC ASSISTED LAPAROSCOPIC RADICAL PROSTATECTOMY LEVEL 1;  Surgeon: Dutch Gray, MD;  Location: [facility];  Service: Urology;  Laterality: N/A;  . TONSILLECTOMY      There were no vitals filed for this visit.   Subjective Assessment - 03/05/20 0806    Subjective Pt reports surgery has been scheduled for 04/11/20.    Pertinent History chronic back pain s/p L3-4 fusion 2018 and L5 discectomy & L4-5 fusion? ~11/2019 with hardware failure requiring another surgery pending surgical clearance following PE and B pneumonia, ACDF 2016, prostate CA, HLD, hay fever, GERD, asthma, R Achilles tendon repair, B knee arthroscopy, R finger surgery, cardiac cath 2004    Diagnostic tests Lumbar MRI 01/08/20: 1. Suspected interval posterior decompression on the right at L4-5with improved patency of the spinal canal. There  is a persistentextruded disc fragment extending cephalad into the right L4 lateralrecess with persistent mass effect on the right L4 nerve root. 2. Stable chronic findings at L2-3 with moderate multifactorial spinal stenosis and mild foraminal narrowing bilaterally. 3. The spinal canal and foramina remain well decompressed post PLIFat L3-4.  CT angiogram 01/16/20: 1. Right lower lobe segmental pulmonary artery embolus. No CTevidence of right heart straining. 2. Bibasilar patchy and streaky densities may represent atelectasis although pneumonia is not excluded. A pulmonary infarct at the rightlung base is less likely. 3. Aortic  Atherosclerosis (ICD10-I70.0).    Patient Stated Goals "to alleviate all of my pain" & "keep my muscle structure up to par to help me get ready for my next surgery"    Currently in Pain? Yes    Pain Score 8    7-8/10   Pain Location Back    Pain Orientation Lower    Pain Descriptors / Indicators Sharp    Pain Type Chronic pain    Pain Frequency Constant                             [facility] Adult PT Treatment/Exercise - 03/05/20 0803      Exercises   Exercises Lumbar      Lumbar Exercises: Aerobic   Nustep L4 x 6 min      Modalities   Modalities Electrical Stimulation;Moist Heat      Moist Heat Therapy   Number Minutes Moist Heat 15 Minutes    Moist Heat Location Lumbar Spine   & buttocks     Electrical Stimulation   Electrical Stimulation Location lumbar paraspinals and R glutes/piriformis    Electrical Stimulation Action IFC    Electrical Stimulation Parameters 80-150Hz , intensity pt tolerance x 15'    Electrical Stimulation Goals Pain;Tone      Manual Therapy   Manual Therapy Soft tissue mobilization;Myofascial release    Manual therapy comments skilled palpation and monitoring during DN    Soft tissue mobilization STM/DTM to R glutes & piriformis - multiple taut bands/TPs identified    Myofascial Release manual TPR to glute medius/minimus and piriformis            Trigger Point Dry Needling - 03/05/20 0803    Consent Given? Yes    Education Handout Provided Previously provided   pt declined - familiar with DN from prior PT episode   Muscles Treated Back/Hip Gluteus minimus;Gluteus medius;Gluteus maximus;Piriformis   Rt   Gluteus Minimus Response Twitch response elicited;Palpable increased muscle length    Gluteus Medius Response Twitch response elicited;Palpable increased muscle length    Gluteus Maximus Response Twitch response elicited;Palpable increased muscle length    Piriformis Response Twitch response elicited;Palpable increased muscle length                      PT Long Term Goals - 03/05/20 0810      PT LONG TERM GOAL #1   Title Patient to be independent with advanced/ongoing HEP.    Status On-going    Target Date 03/28/20      PT LONG TERM GOAL #2   Title Patient to demonstrate appropriate posture and body mechanics needed for daily activities    Status On-going    Target Date 03/28/20      PT LONG TERM GOAL #3   Title Patient to demonstrate improved tissue quality as noted by reduced tissue tightness and tenderness to palpation  Status On-going    Target Date 03/28/20      PT LONG TERM GOAL #4   Title Patient will demonstrate improved B hip and knee strength to >/= 4/5 to 4+/5 for improved stability and ease of mobility    Status On-going    Target Date 03/28/20      PT LONG TERM GOAL #5   Title Patient to report pain reduction in frequency and intensity by >/= 30-50%    Status On-going    Target Date 03/28/20                 Plan - 03/05/20 0811    Clinical Impression Statement Terry [name] reports some increased discomfort initially when performing HEP stretches that seems to be resolving but still notes ongoing tightness most pronounce in R buttock/low back. Increased muscle tension with ttp noted in R glutes and piriformis which was addressed with manual therapy incorporating DN at pt request - good twitch responses elicited with palpable reduction in muscle tension noted following MT. Pt encouraged to continue with HEP stretches to promote continued improvement in flexibility. Session concluded with estim and moist heat as pt noting benefit from this last visit.    Comorbidities chronic back pain s/p L3-4 fusion 2018 and L5 discectomy & L4-5 fusion? ~11/2019 with hardware failure requiring another surgery pending surgical clearance following PE and B pneumonia, ACDF 2016, prostate CA, HLD, hay fever, GERD, asthma, R Achilles tendon repair, B knee arthroscopy, R finger surgery, cardiac cath 2004     Rehab Potential Good    PT Frequency 1x / week    PT Duration 6 weeks    PT Treatment/Interventions ADLs/Self Care Home Management;Cryotherapy;Electrical Stimulation;Iontophoresis 4mg /ml Dexamethasone;Moist Heat;Balance training;Therapeutic exercise;Therapeutic activities;Functional mobility training;Stair training;Gait training;Ultrasound;Neuromuscular re-education;Patient/family education;Manual techniques;Vasopneumatic Device;Taping;Energy conservation;Dry needling;Passive range of motion    PT Next Visit Plan assess response to DN; further HEP review as indicated; lumbopelvic flexibility and strengthening; manual therapy including DN as indicated; modalities PRN - provide options for home TENS unit if interested    PT Home Exercise Plan 9/16 - ITB, piriformis & hip flexor stretches    Consulted and Agree with Plan of Care Patient           Patient will benefit from skilled therapeutic intervention in order to improve the following deficits and impairments:  Abnormal gait, Decreased activity tolerance, Decreased endurance, Decreased mobility, Decreased range of motion, Decreased strength, Difficulty walking, Increased fascial restricitons, Increased muscle spasms, Impaired perceived functional ability, Impaired flexibility, Impaired sensation, Impaired tone, Improper body mechanics, Postural dysfunction, Pain  Visit Diagnosis: Chronic bilateral low back pain with bilateral sciatica  Radiculopathy, lumbosacral region  Muscle spasm of back  Muscle weakness (generalized)  Difficulty in walking, not elsewhere classified  Other abnormalities of gait and mobility     Problem List Patient Active Problem List   Diagnosis Date Noted  . Infection, skin 07/26/2018  . Achilles tendon infection 07/26/2018  . Wound dehiscence, surgical 07/10/2018    Class: Acute  . Wound dehiscence, surgical, initial encounter 07/10/2018  . Spondylolisthesis of lumbar region 08/31/2016  . Neck pain  06/27/2014  . Prostate cancer [facility])     Terry [name], PT, MPT 03/05/2020, 12:30 PM  [facility] [address]  [place], [place], [address] Phone: [phone]   Fax:  [phone]  Name: Terry [name] MRN: [id number] Date of Birth: [birth year]

## 2020-03-08 DIAGNOSIS — K921 Melena: Secondary | ICD-10-CM | POA: Diagnosis not present

## 2020-03-21 ENCOUNTER — Ambulatory Visit: Payer: Medicare Other | Admitting: Physical Therapy

## 2020-03-21 ENCOUNTER — Encounter: Payer: Self-pay | Admitting: Physical Therapy

## 2020-03-21 ENCOUNTER — Other Ambulatory Visit: Payer: Self-pay

## 2020-03-21 DIAGNOSIS — M5417 Radiculopathy, lumbosacral region: Secondary | ICD-10-CM | POA: Diagnosis not present

## 2020-03-21 DIAGNOSIS — M6281 Muscle weakness (generalized): Secondary | ICD-10-CM | POA: Diagnosis not present

## 2020-03-21 DIAGNOSIS — M5441 Lumbago with sciatica, right side: Secondary | ICD-10-CM | POA: Diagnosis not present

## 2020-03-21 DIAGNOSIS — M5442 Lumbago with sciatica, left side: Secondary | ICD-10-CM

## 2020-03-21 DIAGNOSIS — G8929 Other chronic pain: Secondary | ICD-10-CM | POA: Diagnosis not present

## 2020-03-21 DIAGNOSIS — Z23 Encounter for immunization: Secondary | ICD-10-CM | POA: Diagnosis not present

## 2020-03-21 DIAGNOSIS — R262 Difficulty in walking, not elsewhere classified: Secondary | ICD-10-CM

## 2020-03-21 DIAGNOSIS — M62830 Muscle spasm of back: Secondary | ICD-10-CM

## 2020-03-21 DIAGNOSIS — R2689 Other abnormalities of gait and mobility: Secondary | ICD-10-CM

## 2020-03-21 NOTE — Patient Instructions (Signed)
TENS UNIT  This is helpful for muscle pain and spasm.   Search and Purchase a TENS 7000 2nd edition at www.tenspros.com or www.amazon.com  (It should be less than $30)     TENS unit instructions:   Do not shower or bathe with the unit on  Turn the unit off before removing electrodes or batteries  If the electrodes lose stickiness add a drop of water to the electrodes after they are disconnected from the unit and place on plastic sheet. If you continued to have difficulty, call the TENS unit company to purchase more electrodes.  Do not apply lotion on the skin area prior to use. Make sure the skin is clean and dry as this will help prolong the life of the electrodes.  After use, always check skin for unusual red areas, rash or other skin difficulties. If there are any skin problems, does not apply electrodes to the same area.  Never remove the electrodes from the unit by pulling the wires.  Do not use the TENS unit or electrodes other than as directed.  Do not change electrode placement without consulting your therapist or physician.  Keep 2 fingers with between each electrode.   TENS stands for Transcutaneous Electrical Nerve Stimulation. In other words, electrical impulses are allowed to pass through the skin in order to excite a nerve.   Purpose and Use of TENS:  TENS is a method used to manage acute and chronic pain without the use of drugs. It has been effective in managing pain associated with surgery, sprains, strains, trauma, rheumatoid arthritis, and neuralgias. It is a non-addictive, low risk, and non-invasive technique used to control pain. It is not, by any means, a curative form of treatment.   How TENS Works:  Most TENS units are a Paramedic unit powered by one 9 volt battery. Attached to the outside of the unit are two lead wires where two pins and/or snaps connect on each wire. All units come with a set of four reusable pads or electrodes. These are placed  on the skin surrounding the area involved. By inserting the leads into  the pads, the electricity can pass from the unit making the circuit complete.  As the intensity is turned up slowly, the electrical current enters the body from the electrodes through the skin to the surrounding nerve fibers. This triggers the release of hormones from within the body. These hormones contain pain relievers. By increasing the circulation of these hormones, the person's pain may be lessened. It is also believed that the electrical stimulation itself helps to block the pain messages being sent to the brain, thus also decreasing the body's perception of pain.   Hazards:  TENS units are NOT to be used by patients with PACEMAKERS, DEFIBRILLATORS, DIABETIC PUMPS, PREGNANT WOMEN, and patients with SEIZURE DISORDERS.  TENS units are NOT to be used over the heart, throat, brain, or spinal cord.  One of the major side effects from the TENS unit may be skin irritation. Some people may develop a rash if they are sensitive to the materials used in the electrodes or the connecting wires.   Wear the unit for up to 30-45 minutes at a time up to 3-4x/day as needed for pain..   Avoid overuse due the body getting used to the stem making it not as effective over time.

## 2020-03-21 NOTE — Therapy (Addendum)
Terry [name] [address]  [place], [place], [address] Phone: [phone]   Fax:  [phone]  Physical Therapy Treatment / Discharge Summary  Patient Details  Name: [name] MRN: [id number] Date of Birth: Dec 17, 1950 Referring Provider (PT): Kristeen Miss, MD  Progress Note  Reporting Period 02/15/2020 to 03/21/2020  See note below for Objective Data and Assessment of Progress/Goals.      Encounter Date: 03/21/2020   PT End of Session - 03/21/20 0848    Visit Number 4    Number of Visits 6    Date for PT Re-Evaluation 03/28/20    Authorization Type Medicare & BCBS    PT Start Time 0848    PT Stop Time 0947    PT Time Calculation (min) 59 min    Activity Tolerance Patient tolerated treatment well    Behavior During Therapy [facility] for tasks assessed/performed           Past Medical History:  Diagnosis Date  . Arthritis    neck, back, knees, right ankle  . Bladder calculus   . Chronic back pain   . Complication of anesthesia    dizziness and "crazy amount of bile"  . GERD (gastroesophageal reflux disease)   . History of colon polyps    benign  . History of prostate cancer urologist-- dr Alinda Money   dx 2014---  s/p  prostatectomy 05-22-2013 ,  Gleason 3+3  . Hypercholesterolemia    takes Crestor daily  . Hyperlipidemia   . Wears glasses     Past Surgical History:  Procedure Laterality Date  . ACHILLES TENDON SURGERY Right 06-07-2018   _0    reconstruction  . ANTERIOR CERVICAL DECOMP/DISCECTOMY FUSION N/A 06/27/2014   Procedure: ANTERIOR CERVICAL DECOMPRESSION/DISCECTOMY FUSION C5-C7   (2 LEVELS);  Surgeon: Melina Schools, MD;  Location: [facility];  Service: Orthopedics;  Laterality: N/A;  . CARDIAC CATHETERIZATION  2004  . COLONOSCOPY    . CYSTOSCOPY WITH LITHOLAPAXY N/A 03/27/2019   Procedure: CYSTOSCOPY WITH REMOVAL OF BLADDER STONE AND FOREIGN BODY;  Surgeon: Raynelle Bring, MD;  Location: [facility];  Service: Urology;  Laterality: N/A;  . EYE SURGERY     lazy eye as child, lasik surgery  . FINGER SURGERY Right    4th finger  . I & D EXTREMITY Right 07/10/2018   Procedure: IRRIGATION AND DEBRIDEMENT RIGHT ANKLE WOUND AND WOUND VAC PLACEMENT;  Surgeon: Wylene Simmer, MD;  Location: [facility];  Service: Orthopedics;  Laterality: Right;  . KNEE ARTHROSCOPY Bilateral right ?;  left 05-25-2007  _1   . POSTERIOR LAMINECTOMY / DECOMPRESSION LUMBAR SPINE  08-31-2016    dr elsner  _2   . PROSTATE BIOPSY  04/13/13   gleason 3+3=6, vol 55.4 cc  . ROBOT ASSISTED LAPAROSCOPIC RADICAL PROSTATECTOMY N/A 05/22/2013   Procedure: ROBOTIC ASSISTED LAPAROSCOPIC RADICAL PROSTATECTOMY LEVEL 1;  Surgeon: Dutch Gray, MD;  Location: [facility];  Service: Urology;  Laterality: N/A;  . TONSILLECTOMY      There were no vitals filed for this visit.   Subjective Assessment - 03/21/20 0849    Subjective Pt [place] and walked 2-3 miles per day - feels more stiff today as a result. Pt thinks DN helped but states he was uncomfortable for a few days afterward - does not wish to try this again today. Think estim helped.    Pertinent History chronic back pain s/p L3-4 fusion 2018  and L5 discectomy & L4-5 fusion? ~11/2019 with hardware failure requiring another surgery pending surgical clearance following PE and B pneumonia, ACDF 2016, prostate CA, HLD, hay fever, GERD, asthma, R Achilles tendon repair, B knee arthroscopy, R finger surgery, cardiac cath 2004    Diagnostic tests Lumbar MRI 01/08/20: 1. Suspected interval posterior decompression on the right at L4-5with improved patency of the spinal canal. There is a persistentextruded disc fragment extending cephalad into the right L4 lateralrecess with persistent mass effect on the right L4 nerve root. 2. Stable chronic findings at L2-3 with moderate multifactorial spinal stenosis and mild foraminal narrowing bilaterally. 3. The  spinal canal and foramina remain well decompressed post PLIFat L3-4.  CT angiogram 01/16/20: 1. Right lower lobe segmental pulmonary artery embolus. No CTevidence of right heart straining. 2. Bibasilar patchy and streaky densities may represent atelectasis although pneumonia is not excluded. A pulmonary infarct at the rightlung base is less likely. 3. Aortic Atherosclerosis (ICD10-I70.0).    Patient Stated Goals "to alleviate all of my pain" & "keep my muscle structure up to par to help me get ready for my next surgery"    Currently in Pain? Yes    Pain Score 7     Pain Location Back    Pain Orientation Lower    Pain Descriptors / Indicators Sharp    Pain Type Chronic pain    Pain Radiating Towards pain, numbness & tingling across back and into R butttock    Pain Frequency Constant              [facility] PT Assessment - 03/21/20 0848      Assessment   Medical Diagnosis Lumbar spondylolithesis    Referring Provider (PT) Kristeen Miss, MD    Onset Date/Surgical Date --   late June   Next MD Visit surgery 04/11/20      Observation/Other Assessments   Focus on Therapeutic Outcomes (FOTO)  Lumbar - 57% (43% limitation)      Strength   Right Hip Flexion 4+/5    Right Hip Extension 3+/5    Right Hip External Rotation  4/5    Right Hip Internal Rotation 4+/5    Right Hip ABduction 4/5    Right Hip ADduction 4/5    Left Hip Flexion 4+/5    Left Hip Extension 4-/5    Left Hip External Rotation 4/5    Left Hip Internal Rotation 4+/5    Left Hip ABduction 4/5    Left Hip ADduction 4/5    Right Knee Flexion 4+/5    Right Knee Extension 4+/5    Left Knee Flexion 5/5    Left Knee Extension 5/5    Right Ankle Dorsiflexion 4/5    Left Ankle Dorsiflexion 4+/5                         [facility] Adult PT Treatment/Exercise - 03/21/20 0848      Lumbar Exercises: Aerobic   Nustep L4 x 6 min      Modalities   Modalities Electrical Stimulation;Moist Heat      Moist Heat Therapy    Number Minutes Moist Heat 15 Minutes    Moist Heat Location Lumbar Spine   & buttocks     Electrical Stimulation   Electrical Stimulation Location B lumbar paraspinals and glutes/piriformis    Electrical Stimulation Action IFC    Electrical Stimulation Parameters 80-150Hz, intensity pt tolerance x 15'    Electrical Stimulation Goals Pain;Tone  PT Education - 03/21/20 0930    Education Details Home estim options    Person(s) Educated Patient    Methods Explanation;Handout    Comprehension Verbalized understanding               PT Long Term Goals - 03/21/20 0913      PT LONG TERM GOAL #1   Title Patient to be independent with advanced/ongoing HEP.    Status Achieved   03/21/20     PT LONG TERM GOAL #2   Title Patient to demonstrate appropriate posture and body mechanics needed for daily activities    Status Achieved   03/21/20     PT LONG TERM GOAL #3   Title Patient to demonstrate improved tissue quality as noted by reduced tissue tightness and tenderness to palpation    Status Partially Met      PT LONG TERM GOAL #4   Title Patient will demonstrate improved B hip and knee strength to >/= 4/5 to 4+/5 for improved stability and ease of mobility    Status Partially Met      PT LONG TERM GOAL #5   Title Patient to report pain reduction in frequency and intensity by >/= 30-50%    Status Achieved   03/21/20 - pt noting 50% improvement in pain allowing for more activity/mobility                Plan - 03/21/20 0853    Clinical Impression Statement Luisenrique is scheduled for L4-5 posterior lumbar interbody fusion surgery on 04/11/20 to correct hardware failure from prior surgery. He continues to report pain level of "7-8-9"/10 but reports 50% improvement with current PT episode and FOTO exceeded predicted 45% limitation with only 43% limitation for lumbar spine. He denies need for review of HEP, stating exercise are going well but question consistency  at home. Muscle tension and flexibility has improved but he continues to note a "taut band in the front" although he declined further manual therapy or DN to address this. Overall B LE strength also improved but still some increased proximal weakness R>L along with longstanding ankle weakness from prior injury. All goals met or partially met, therefore will proceed with discharge from PT and transition to HEP at this time pending upcoming surgery. In the interim, Anselmo would benefit from home estim device due to muscle atrophy in R hip contributing to his pain.   Comorbidities chronic back pain s/p L3-4 fusion 2018 and L5 discectomy & L4-5 fusion? ~11/2019 with hardware failure requiring another surgery pending surgical clearance following PE and B pneumonia, ACDF 2016, prostate CA, HLD, hay fever, GERD, asthma, R Achilles tendon repair, B knee arthroscopy, R finger surgery, cardiac cath 2004    Rehab Potential Good    PT Treatment/Interventions ADLs/Self Care Home Management;Cryotherapy;Electrical Stimulation;Iontophoresis 29m/ml Dexamethasone;Moist Heat;Balance training;Therapeutic exercise;Therapeutic activities;Functional mobility training;Stair training;Gait training;Ultrasound;Neuromuscular re-education;Patient/family education;Manual techniques;Vasopneumatic Device;Taping;Energy conservation;Dry needling;Passive range of motion    PT Next Visit Plan Discharge    PT Home Exercise Plan 9/16 - ITB, piriformis & hip flexor stretches    Consulted and Agree with Plan of Care Patient           Patient will benefit from skilled therapeutic intervention in order to improve the following deficits and impairments:  Abnormal gait, Decreased activity tolerance, Decreased endurance, Decreased mobility, Decreased range of motion, Decreased strength, Difficulty walking, Increased fascial restricitons, Increased muscle spasms, Impaired perceived functional ability, Impaired flexibility, Impaired sensation, Impaired  tone, Improper body mechanics, Postural  dysfunction, Pain  Visit Diagnosis: Chronic bilateral low back pain with bilateral sciatica  Radiculopathy, lumbosacral region  Muscle spasm of back  Muscle weakness (generalized)  Difficulty in walking, not elsewhere classified  Other abnormalities of gait and mobility     Problem List Patient Active Problem List   Diagnosis Date Noted  . Infection, skin 07/26/2018  . Achilles tendon infection 07/26/2018  . Wound dehiscence, surgical 07/10/2018    Class: Acute  . Wound dehiscence, surgical, initial encounter 07/10/2018  . Spondylolisthesis of lumbar region 08/31/2016  . Neck pain 06/27/2014  . Prostate cancer ([place])     PHYSICAL THERAPY DISCHARGE SUMMARY  Visits from Start of Care: 4  Current functional level related to goals / functional outcomes:   Refer to above clinical impression.   Remaining deficits:   As above. Alim is scheduled for L4-5 posterior lumbar interbody fusion surgery on 04/11/20 to correct hardware failure from prior surgery.   Education / Equipment:   HEP, Training and development officer education, home estim unit recommended Plan: Patient agrees to discharge.  Patient goals were partially met. Patient is being discharged due to                                                    upcoming surgery. ?????      Percival Spanish, PT, MPT 03/21/2020, 12:48 PM  [facility] [address]  [place], [place], [address] Phone: [phone]   Fax:  [phone]  Name: CHRISTHOPER BUSBEE MRN: [id number] Date of Birth: 05-09-1951

## 2020-03-22 DIAGNOSIS — K921 Melena: Secondary | ICD-10-CM | POA: Diagnosis not present

## 2020-04-05 NOTE — Progress Notes (Signed)
[id number] - [place], [place] - [address] AT [place] [address] Phone: [phone] Fax: [phone]      Your procedure is scheduled on 04/11/20.  Report to [facility] Main Entrance "A" at 5:30 A.M., and check in at the Admitting office.  Call this number if you have problems the morning of surgery:  [phone]  Call [phone] if you have any questions prior to your surgery date Monday-Friday 8am-4pm    Remember:  Do not eat or drink after midnight the night before your surgery    Take these medicines the morning of surgery with A SIP OF WATER: cycloSPORINE (RESTASIS)  If needed:  acetaminophen (TYLENOL) cetirizine (ZYRTEC) methocarbamol (ROBAXIN)  As of today, STOP taking any Aspirin (unless otherwise instructed by your surgeon) Aleve, Naproxen, Ibuprofen, Motrin, Advil, Goody's, BC's, all herbal medications, fish oil, and all vitamins.                      Do not wear jewelry            Do not wear lotions, powders, colognes, or deodorant.            Do not shave 48 hours prior to surgery.  Men may shave face and neck.            Do not bring valuables to the hospital.            [facility] is not responsible for any belongings or valuables.  Do NOT Smoke (Tobacco/Vaping) or drink Alcohol 24 hours prior to your procedure If you use a CPAP at night, you may bring all equipment for your overnight stay.   Contacts, glasses, dentures or bridgework may not be worn into surgery.      For patients admitted to the hospital, discharge time will be determined by your treatment team.   Patients discharged the day of surgery will not be allowed to drive home, and someone needs to stay with them for 24 hours.    Special instructions:   [place]- Preparing For Surgery  Before surgery, you can play an important role. Because skin is not sterile, your skin needs to be as free of germs as possible.  You can reduce the number of germs on your skin by washing with CHG (chlorahexidine gluconate) Soap before surgery.  CHG is an antiseptic cleaner which kills germs and bonds with the skin to continue killing germs even after washing.    Oral Hygiene is also important to reduce your risk of infection.  Remember - BRUSH YOUR TEETH THE MORNING OF SURGERY WITH YOUR REGULAR TOOTHPASTE  Please do not use if you have an allergy to CHG or antibacterial soaps. If your skin becomes reddened/irritated stop using the CHG.  Do not shave (including legs and underarms) for at least 48 hours prior to first CHG shower. It is OK to shave your face.  Please follow these instructions carefully.   1. Shower the NIGHT BEFORE SURGERY and the MORNING OF SURGERY with CHG Soap.   2. If you chose to wash your hair, wash your hair first as usual with your normal shampoo.  3. After you shampoo, rinse your hair and body thoroughly to remove the shampoo.  4. Use CHG as you would any other liquid soap. You can apply CHG directly to the skin and wash gently with a scrungie or a clean washcloth.   5. Apply  the CHG Soap to your body ONLY FROM THE NECK DOWN.  Do not use on open wounds or open sores. Avoid contact with your eyes, ears, mouth and genitals (private parts). Wash Face and genitals (private parts)  with your normal soap.   6. Wash thoroughly, paying special attention to the area where your surgery will be performed.  7. Thoroughly rinse your body with warm water from the neck down.  8. DO NOT shower/wash with your normal soap after using and rinsing off the CHG Soap.  9. Pat yourself dry with a CLEAN TOWEL.  10. Wear CLEAN PAJAMAS to bed the night before surgery  11. Place CLEAN SHEETS on your bed the night of your first shower and DO NOT SLEEP WITH PETS.   Day of Surgery: Wear Clean/Comfortable clothing the morning of surgery Do not apply any deodorants/lotions.   Remember to brush your teeth WITH YOUR  REGULAR TOOTHPASTE.   Please read over the following fact sheets that you were given.

## 2020-04-08 ENCOUNTER — Encounter (HOSPITAL_COMMUNITY): Payer: Self-pay

## 2020-04-08 ENCOUNTER — Encounter (HOSPITAL_COMMUNITY)
Admission: RE | Admit: 2020-04-08 | Discharge: 2020-04-08 | Disposition: A | Discharge status: Home / Self Care | Payer: Medicare Other | Source: Ambulatory Visit | Attending: Neurological Surgery | Admitting: Neurological Surgery

## 2020-04-08 ENCOUNTER — Other Ambulatory Visit: Payer: Self-pay

## 2020-04-08 ENCOUNTER — Other Ambulatory Visit (HOSPITAL_COMMUNITY)
Admission: RE | Admit: 2020-04-08 | Discharge: 2020-04-08 | Disposition: A | Discharge status: Home / Self Care | Payer: Medicare Other | Source: Ambulatory Visit | Attending: Neurological Surgery | Admitting: Neurological Surgery

## 2020-04-08 DIAGNOSIS — Z8546 Personal history of malignant neoplasm of prostate: Secondary | ICD-10-CM | POA: Diagnosis not present

## 2020-04-08 DIAGNOSIS — Z20822 Contact with and (suspected) exposure to covid-19: Secondary | ICD-10-CM | POA: Insufficient documentation

## 2020-04-08 DIAGNOSIS — Z8249 Family history of ischemic heart disease and other diseases of the circulatory system: Secondary | ICD-10-CM | POA: Diagnosis not present

## 2020-04-08 DIAGNOSIS — I2699 Other pulmonary embolism without acute cor pulmonale: Secondary | ICD-10-CM | POA: Insufficient documentation

## 2020-04-08 DIAGNOSIS — Z86711 Personal history of pulmonary embolism: Secondary | ICD-10-CM | POA: Diagnosis not present

## 2020-04-08 DIAGNOSIS — Z820 Family history of epilepsy and other diseases of the nervous system: Secondary | ICD-10-CM | POA: Diagnosis not present

## 2020-04-08 DIAGNOSIS — Z981 Arthrodesis status: Secondary | ICD-10-CM | POA: Diagnosis not present

## 2020-04-08 DIAGNOSIS — Z9079 Acquired absence of other genital organ(s): Secondary | ICD-10-CM | POA: Diagnosis not present

## 2020-04-08 DIAGNOSIS — Z01818 Encounter for other preprocedural examination: Secondary | ICD-10-CM | POA: Insufficient documentation

## 2020-04-08 DIAGNOSIS — M5116 Intervertebral disc disorders with radiculopathy, lumbar region: Secondary | ICD-10-CM | POA: Diagnosis not present

## 2020-04-08 DIAGNOSIS — M4316 Spondylolisthesis, lumbar region: Secondary | ICD-10-CM | POA: Diagnosis not present

## 2020-04-08 DIAGNOSIS — K219 Gastro-esophageal reflux disease without esophagitis: Secondary | ICD-10-CM | POA: Diagnosis not present

## 2020-04-08 DIAGNOSIS — I700 Atherosclerosis of aorta: Secondary | ICD-10-CM | POA: Insufficient documentation

## 2020-04-08 DIAGNOSIS — Z01812 Encounter for preprocedural laboratory examination: Secondary | ICD-10-CM | POA: Insufficient documentation

## 2020-04-08 DIAGNOSIS — E785 Hyperlipidemia, unspecified: Secondary | ICD-10-CM | POA: Diagnosis not present

## 2020-04-08 DIAGNOSIS — E7800 Pure hypercholesterolemia, unspecified: Secondary | ICD-10-CM | POA: Diagnosis not present

## 2020-04-08 HISTORY — DX: Personal history of urinary calculi: Z87.442

## 2020-04-08 LAB — BASIC METABOLIC PANEL
Anion gap: 11 (ref 5–15)
BUN: 11 mg/dL (ref 8–23)
CO2: 27 mmol/L (ref 22–32)
Calcium: 9.5 mg/dL (ref 8.9–10.3)
Chloride: 103 mmol/L (ref 98–111)
Creatinine, Ser: 0.93 mg/dL (ref 0.61–1.24)
GFR, Estimated: >60 mL/min (ref >60)
Glucose, Bld: 105 mg/dL — ABNORMAL HIGH (ref 70–99)
Potassium: 4.1 mmol/L (ref 3.5–5.1)
Sodium: 141 mmol/L (ref 135–145)

## 2020-04-08 LAB — TYPE AND SCREEN
ABO/RH(D): B NEG
Antibody Screen: NEGATIVE

## 2020-04-08 LAB — SURGICAL PCR SCREEN
MRSA, PCR: NEGATIVE
Staphylococcus aureus: NEGATIVE

## 2020-04-08 LAB — SARS CORONAVIRUS 2 (TAT 6-24 HRS): SARS Coronavirus 2: NEGATIVE

## 2020-04-08 LAB — CBC
HCT: 49.4 % (ref 39.0–52.0)
Hemoglobin: 15.9 g/dL (ref 13.0–17.0)
MCH: 28.8 pg (ref 26.0–34.0)
MCHC: 32.2 g/dL (ref 30.0–36.0)
MCV: 89.3 fL (ref 80.0–100.0)
Platelets: 241 10*3/uL (ref 150–400)
RBC: 5.53 MIL/uL (ref 4.22–5.81)
RDW: 13 % (ref 11.5–15.5)
WBC: 7.2 10*3/uL (ref 4.0–10.5)
nRBC: 0 % (ref 0.0–0.2)

## 2020-04-08 NOTE — Progress Notes (Signed)
PCP - Shon Baton Cardiologist - [place]- 2004, hasn't seen since    Chest x-ray -  Ct chest 01/16/20 EKG - 04/08/20 Stress Test - 2004 ECHO - 2004 Cardiac Cath - 2004--normal  Sleep Study -  na   Fasting Blood Sugar -  na   Blood Thinner Instructions: stopped xarleto 8 days ago Aspirin Instructions:  na    COVID TEST-  04/08/20   Anesthesia review:  Hx. Pul. emboli  Patient denies shortness of breath, fever, cough and chest pain at PAT appointment   All instructions explained to the patient, with a verbal understanding of the material. Patient agrees to go over the instructions while at home for a better understanding. Patient also instructed to self quarantine after being tested for COVID-19. The opportunity to ask questions was provided.

## 2020-04-09 NOTE — Anesthesia Preprocedure Evaluation (Addendum)
Anesthesia Evaluation  Patient identified by MRN, date of birth, ID band Patient awake    Reviewed: Allergy & Precautions, NPO status , Patient's Chart, lab work & pertinent test results  Airway Mallampati: II  TM Distance: >3 FB Neck ROM: Full    Dental no notable dental hx.    Pulmonary pneumonia, resolved, PE   Pulmonary exam normal breath sounds clear to auscultation       Cardiovascular negative cardio ROS Normal cardiovascular exam Rhythm:Regular Rate:Normal  ECG: NSR, rate 68   Neuro/Psych negative neurological ROS     GI/Hepatic Neg liver ROS, GERD  Medicated and Controlled,  Endo/Other  negative endocrine ROS  Renal/GU negative Renal ROS     Musculoskeletal  (+) Arthritis ,   Abdominal   Peds  Hematology  (+) Blood dyscrasia, , HLD   Anesthesia Other Findings Herniated nucleus pulposus of lumbosacral region  Reproductive/Obstetrics                            Anesthesia Physical Anesthesia Plan  ASA: II  Anesthesia Plan: General   Post-op Pain Management:    Induction: Intravenous  PONV Risk Score and Plan: 2 and Ondansetron, Dexamethasone, Propofol infusion and Treatment may vary due to age or medical condition  Airway Management Planned: Oral ETT  Additional Equipment:   Intra-op Plan:   Post-operative Plan: Extubation in OR  Informed Consent: I have reviewed the patients History and Physical, chart, labs and discussed the procedure including the risks, benefits and alternatives for the proposed anesthesia with the patient or authorized representative who has indicated his/her understanding and acceptance.     Dental advisory given  Plan Discussed with: CRNA  Anesthesia Plan Comments: (PAT note by Karoline Caldwell, PA-C: Patient following with PCP for recent diagnosis of pulmonary embolism in August 2021, being treated with Xarelto.  Per PCP note 02/15/2020, "DVT/PE  01/16/2020 was considered provoked (no obvious cancers on either scan) so can come off Xarelto 04/16/2020 see Dr. Ellene Route and schedule next surgery which will be lumbar 4-5 posterior interbody fusion."  Patient subsequently corresponded with Dr. Virgina Jock to see if he can stop Xarelto earlier to allow for surgery on 04/11/2020.  Per note on patient chart dated 03/01/2020, Dr. Virgina Jock advised it would be okay to stop earlier.  Dr. Keane Police notes also state patient had a remote stress echo in 2008 that was negative.  Preop labs reviewed, unremarkable.  EKG 04/08/2020: NSR.  Rate 68.  CTA chest 01/16/2020: IMPRESSION: 1. Right lower lobe segmental pulmonary artery embolus. No CT evidence of right heart straining. 2. Bibasilar patchy and streaky densities may represent atelectasis although pneumonia is not excluded. A pulmonary infarct at the right lung base is less likely. 3. Aortic Atherosclerosis (ICD10-I70.0). )      Anesthesia Quick Evaluation

## 2020-04-09 NOTE — Progress Notes (Signed)
Anesthesia Chart Review:  Patient following with PCP for recent diagnosis of pulmonary embolism in August 2021, being treated with Xarelto.  Per PCP note 02/15/2020, "DVT/PE 01/16/2020 was considered provoked (no obvious cancers on either scan) so can come off Xarelto 04/16/2020 see Dr. Ellene Route and schedule next surgery which will be lumbar 4-5 posterior interbody fusion."  Patient subsequently corresponded with Dr. Virgina Jock to see if he can stop Xarelto earlier to allow for surgery on 04/11/2020.  Per note on patient chart dated 03/01/2020, Dr. Virgina Jock advised it would be okay to stop earlier.  Dr. Keane Police notes also state patient had a remote stress echo in 2008 that was negative.  Preop labs reviewed, unremarkable.  EKG 04/08/2020: NSR.  Rate 68.  CTA chest 01/16/2020: IMPRESSION: 1. Right lower lobe segmental pulmonary artery embolus. No CT evidence of right heart straining. 2. Bibasilar patchy and streaky densities may represent atelectasis although pneumonia is not excluded. A pulmonary infarct at the right lung base is less likely. 3. Aortic Atherosclerosis (ICD10-I70.0).   Wynonia Musty [facility] Short Stay Center/Anesthesiology Phone [phone] 04/09/2020 11:18 AM

## 2020-04-11 ENCOUNTER — Inpatient Hospital Stay (HOSPITAL_COMMUNITY)
Admission: RE | Admit: 2020-04-11 | Discharge: 2020-04-13 | DRG: 455 | Disposition: A | Discharge status: Home / Self Care | Payer: Medicare Other | Attending: Neurological Surgery | Admitting: Neurological Surgery

## 2020-04-11 ENCOUNTER — Inpatient Hospital Stay (HOSPITAL_COMMUNITY): Payer: Medicare Other | Admitting: Anesthesiology

## 2020-04-11 ENCOUNTER — Inpatient Hospital Stay (HOSPITAL_COMMUNITY): Payer: Medicare Other | Admitting: Physician Assistant

## 2020-04-11 ENCOUNTER — Encounter (HOSPITAL_COMMUNITY): Payer: Self-pay | Admitting: Neurological Surgery

## 2020-04-11 ENCOUNTER — Encounter (HOSPITAL_COMMUNITY)
Admission: RE | Disposition: A | Discharge status: Home / Self Care | Payer: Self-pay | Source: Home / Self Care | Attending: Neurological Surgery

## 2020-04-11 ENCOUNTER — Inpatient Hospital Stay (HOSPITAL_COMMUNITY): Payer: Medicare Other

## 2020-04-11 ENCOUNTER — Other Ambulatory Visit: Payer: Self-pay

## 2020-04-11 DIAGNOSIS — Z9079 Acquired absence of other genital organ(s): Secondary | ICD-10-CM | POA: Diagnosis not present

## 2020-04-11 DIAGNOSIS — Z820 Family history of epilepsy and other diseases of the nervous system: Secondary | ICD-10-CM | POA: Diagnosis not present

## 2020-04-11 DIAGNOSIS — Z79899 Other long term (current) drug therapy: Secondary | ICD-10-CM

## 2020-04-11 DIAGNOSIS — Z981 Arthrodesis status: Secondary | ICD-10-CM | POA: Diagnosis not present

## 2020-04-11 DIAGNOSIS — Z880 Allergy status to penicillin: Secondary | ICD-10-CM | POA: Diagnosis not present

## 2020-04-11 DIAGNOSIS — Z8249 Family history of ischemic heart disease and other diseases of the circulatory system: Secondary | ICD-10-CM | POA: Diagnosis not present

## 2020-04-11 DIAGNOSIS — K219 Gastro-esophageal reflux disease without esophagitis: Secondary | ICD-10-CM | POA: Diagnosis not present

## 2020-04-11 DIAGNOSIS — E7800 Pure hypercholesterolemia, unspecified: Secondary | ICD-10-CM | POA: Diagnosis present

## 2020-04-11 DIAGNOSIS — Z20822 Contact with and (suspected) exposure to covid-19: Secondary | ICD-10-CM | POA: Diagnosis not present

## 2020-04-11 DIAGNOSIS — M5127 Other intervertebral disc displacement, lumbosacral region: Secondary | ICD-10-CM | POA: Diagnosis not present

## 2020-04-11 DIAGNOSIS — M4316 Spondylolisthesis, lumbar region: Secondary | ICD-10-CM | POA: Diagnosis present

## 2020-04-11 DIAGNOSIS — Z419 Encounter for procedure for purposes other than remedying health state, unspecified: Secondary | ICD-10-CM

## 2020-04-11 DIAGNOSIS — E785 Hyperlipidemia, unspecified: Secondary | ICD-10-CM | POA: Diagnosis not present

## 2020-04-11 DIAGNOSIS — M5116 Intervertebral disc disorders with radiculopathy, lumbar region: Secondary | ICD-10-CM | POA: Diagnosis not present

## 2020-04-11 DIAGNOSIS — Z8546 Personal history of malignant neoplasm of prostate: Secondary | ICD-10-CM | POA: Diagnosis not present

## 2020-04-11 DIAGNOSIS — Z7901 Long term (current) use of anticoagulants: Secondary | ICD-10-CM

## 2020-04-11 DIAGNOSIS — Z86711 Personal history of pulmonary embolism: Secondary | ICD-10-CM | POA: Diagnosis not present

## 2020-04-11 DIAGNOSIS — M4326 Fusion of spine, lumbar region: Secondary | ICD-10-CM | POA: Diagnosis not present

## 2020-04-11 DIAGNOSIS — C61 Malignant neoplasm of prostate: Secondary | ICD-10-CM | POA: Diagnosis not present

## 2020-04-11 SURGERY — POSTERIOR LUMBAR FUSION 1 LEVEL
Anesthesia: General | Site: Spine Lumbar

## 2020-04-11 MED ORDER — CHLORHEXIDINE GLUCONATE CLOTH 2 % EX PADS: 6.0000 | MEDICATED_PAD | Freq: Once | CUTANEOUS | Status: DC

## 2020-04-11 MED ORDER — BISACODYL 10 MG RE SUPP: 10.0000 mg | Freq: Every day | RECTAL | Status: DC | PRN

## 2020-04-11 MED ORDER — FENTANYL CITRATE (PF) 100 MCG/2ML IJ SOLN
INTRAMUSCULAR | Status: AC
  Filled 2020-04-11: qty 2

## 2020-04-11 MED ORDER — METHOCARBAMOL 1000 MG/10ML IJ SOLN
500.0000 mg | Freq: Four times a day (QID) | INTRAVENOUS | Status: DC | PRN
  Filled 2020-04-11: qty 5

## 2020-04-11 MED ORDER — ACETAMINOPHEN 325 MG PO TABS: 650.0000 mg | ORAL_TABLET | ORAL | Status: DC | PRN

## 2020-04-11 MED ORDER — SENNA 8.6 MG PO TABS
1.0000 | ORAL_TABLET | Freq: Two times a day (BID) | ORAL | Status: DC
  Administered 2020-04-11 – 2020-04-12 (×3): 8.6 mg via ORAL
  Filled 2020-04-11 (×3): qty 1

## 2020-04-11 MED ORDER — PROPOFOL 10 MG/ML IV BOLUS
INTRAVENOUS | Status: DC | PRN
  Administered 2020-04-11: 160 mg via INTRAVENOUS

## 2020-04-11 MED ORDER — THROMBIN 20000 UNITS EX SOLR
CUTANEOUS | Status: DC | PRN
  Administered 2020-04-11: 20 mL via TOPICAL

## 2020-04-11 MED ORDER — VANCOMYCIN HCL IN DEXTROSE 1-5 GM/200ML-% IV SOLN
1000.0000 mg | INTRAVENOUS | Status: AC
  Administered 2020-04-11: 1000 mg via INTRAVENOUS

## 2020-04-11 MED ORDER — MENTHOL 3 MG MT LOZG: 1.0000 | LOZENGE | OROMUCOSAL | Status: DC | PRN

## 2020-04-11 MED ORDER — THROMBIN 20000 UNITS EX SOLR
CUTANEOUS | Status: AC
  Filled 2020-04-11: qty 20000

## 2020-04-11 MED ORDER — FENTANYL CITRATE (PF) 250 MCG/5ML IJ SOLN
INTRAMUSCULAR | Status: DC | PRN
  Administered 2020-04-11: 150 ug via INTRAVENOUS
  Administered 2020-04-11: 50 ug via INTRAVENOUS

## 2020-04-11 MED ORDER — DOCUSATE SODIUM 100 MG PO CAPS
100.0000 mg | ORAL_CAPSULE | Freq: Two times a day (BID) | ORAL | Status: DC
  Administered 2020-04-11 – 2020-04-12 (×3): 100 mg via ORAL
  Filled 2020-04-11 (×3): qty 1

## 2020-04-11 MED ORDER — LIDOCAINE-EPINEPHRINE 1 %-1:100000 IJ SOLN
INTRAMUSCULAR | Status: DC | PRN
  Administered 2020-04-11: 5 mL

## 2020-04-11 MED ORDER — METHOCARBAMOL 500 MG PO TABS
500.0000 mg | ORAL_TABLET | Freq: Four times a day (QID) | ORAL | Status: DC | PRN
  Administered 2020-04-11 – 2020-04-12 (×5): 500 mg via ORAL
  Filled 2020-04-11 (×5): qty 1

## 2020-04-11 MED ORDER — MORPHINE SULFATE (PF) 2 MG/ML IV SOLN: 2.0000 mg | INTRAVENOUS | Status: DC | PRN

## 2020-04-11 MED ORDER — LACTATED RINGERS IV SOLN: INTRAVENOUS | Status: DC

## 2020-04-11 MED ORDER — CHLORHEXIDINE GLUCONATE 0.12 % MT SOLN
15.0000 mL | Freq: Once | OROMUCOSAL | Status: AC
  Administered 2020-04-11: 15 mL via OROMUCOSAL
  Filled 2020-04-11: qty 15

## 2020-04-11 MED ORDER — OXYCODONE-ACETAMINOPHEN 5-325 MG PO TABS
1.0000 | ORAL_TABLET | ORAL | Status: DC | PRN
  Administered 2020-04-11 – 2020-04-12 (×7): 2 via ORAL
  Administered 2020-04-12: 1 via ORAL
  Administered 2020-04-13: 2 via ORAL
  Filled 2020-04-11: qty 1
  Filled 2020-04-11 (×8): qty 2

## 2020-04-11 MED ORDER — LIDOCAINE 2% (20 MG/ML) 5 ML SYRINGE
INTRAMUSCULAR | Status: DC | PRN
  Administered 2020-04-11: 50 mg via INTRAVENOUS

## 2020-04-11 MED ORDER — LORATADINE 10 MG PO TABS
10.0000 mg | ORAL_TABLET | Freq: Every day | ORAL | Status: DC
  Administered 2020-04-11 – 2020-04-12 (×2): 10 mg via ORAL
  Filled 2020-04-11 (×2): qty 1

## 2020-04-11 MED ORDER — METHOCARBAMOL 500 MG PO TABS: 500.0000 mg | ORAL_TABLET | Freq: Four times a day (QID) | ORAL | Status: DC | PRN

## 2020-04-11 MED ORDER — PROPOFOL 10 MG/ML IV BOLUS
INTRAVENOUS | Status: AC
  Filled 2020-04-11: qty 20

## 2020-04-11 MED ORDER — PHENYLEPHRINE HCL-NACL 10-0.9 MG/250ML-% IV SOLN
INTRAVENOUS | Status: DC | PRN
  Administered 2020-04-11: 40 ug/min via INTRAVENOUS

## 2020-04-11 MED ORDER — ALUM & MAG HYDROXIDE-SIMETH 200-200-20 MG/5ML PO SUSP: 30.0000 mL | Freq: Four times a day (QID) | ORAL | Status: DC | PRN

## 2020-04-11 MED ORDER — PANTOPRAZOLE SODIUM 40 MG PO TBEC
40.0000 mg | DELAYED_RELEASE_TABLET | Freq: Every day | ORAL | Status: DC
  Administered 2020-04-11 – 2020-04-12 (×2): 40 mg via ORAL
  Filled 2020-04-11 (×2): qty 1

## 2020-04-11 MED ORDER — THROMBIN 5000 UNITS EX SOLR
OROMUCOSAL | Status: DC | PRN
  Administered 2020-04-11: 5 mL via TOPICAL

## 2020-04-11 MED ORDER — ALBUMIN HUMAN 5 % IV SOLN: INTRAVENOUS | Status: DC | PRN

## 2020-04-11 MED ORDER — ONDANSETRON HCL 4 MG/2ML IJ SOLN: 4.0000 mg | Freq: Four times a day (QID) | INTRAMUSCULAR | Status: DC | PRN

## 2020-04-11 MED ORDER — PHENOL 1.4 % MT LIQD: 1.0000 | OROMUCOSAL | Status: DC | PRN

## 2020-04-11 MED ORDER — SUGAMMADEX SODIUM 200 MG/2ML IV SOLN
INTRAVENOUS | Status: DC | PRN
  Administered 2020-04-11: 200 mg via INTRAVENOUS

## 2020-04-11 MED ORDER — ROCURONIUM BROMIDE 10 MG/ML (PF) SYRINGE
PREFILLED_SYRINGE | INTRAVENOUS | Status: AC
  Filled 2020-04-11: qty 10

## 2020-04-11 MED ORDER — ONDANSETRON HCL 4 MG/2ML IJ SOLN: 4.0000 mg | Freq: Once | INTRAMUSCULAR | Status: DC | PRN

## 2020-04-11 MED ORDER — ACETAMINOPHEN 325 MG PO TABS: 650.0000 mg | ORAL_TABLET | Freq: Four times a day (QID) | ORAL | Status: DC | PRN

## 2020-04-11 MED ORDER — SODIUM CHLORIDE 0.9% FLUSH
3.0000 mL | Freq: Two times a day (BID) | INTRAVENOUS | Status: DC
  Administered 2020-04-11: 3 mL via INTRAVENOUS

## 2020-04-11 MED ORDER — ORAL CARE MOUTH RINSE: 15.0000 mL | Freq: Once | OROMUCOSAL | Status: AC

## 2020-04-11 MED ORDER — DEXAMETHASONE SODIUM PHOSPHATE 10 MG/ML IJ SOLN
INTRAMUSCULAR | Status: AC
  Filled 2020-04-11: qty 1

## 2020-04-11 MED ORDER — EPHEDRINE 5 MG/ML INJ
INTRAVENOUS | Status: AC
  Filled 2020-04-11: qty 10

## 2020-04-11 MED ORDER — POLYETHYLENE GLYCOL 3350 17 G PO PACK: 17.0000 g | PACK | Freq: Every day | ORAL | Status: DC | PRN

## 2020-04-11 MED ORDER — FENTANYL CITRATE (PF) 100 MCG/2ML IJ SOLN
25.0000 ug | INTRAMUSCULAR | Status: DC | PRN
  Administered 2020-04-11 (×2): 50 ug via INTRAVENOUS

## 2020-04-11 MED ORDER — FLEET ENEMA 7-19 GM/118ML RE ENEM: 1.0000 | ENEMA | Freq: Once | RECTAL | Status: DC | PRN

## 2020-04-11 MED ORDER — LIDOCAINE 2% (20 MG/ML) 5 ML SYRINGE
INTRAMUSCULAR | Status: AC
  Filled 2020-04-11: qty 5

## 2020-04-11 MED ORDER — FENTANYL CITRATE (PF) 250 MCG/5ML IJ SOLN
INTRAMUSCULAR | Status: AC
  Filled 2020-04-11: qty 5

## 2020-04-11 MED ORDER — ROCURONIUM BROMIDE 10 MG/ML (PF) SYRINGE
PREFILLED_SYRINGE | INTRAVENOUS | Status: DC | PRN
  Administered 2020-04-11: 50 mg via INTRAVENOUS
  Administered 2020-04-11: 40 mg via INTRAVENOUS

## 2020-04-11 MED ORDER — SUCCINYLCHOLINE CHLORIDE 200 MG/10ML IV SOSY
PREFILLED_SYRINGE | INTRAVENOUS | Status: AC
  Filled 2020-04-11: qty 10

## 2020-04-11 MED ORDER — PHENYLEPHRINE 40 MCG/ML (10ML) SYRINGE FOR IV PUSH (FOR BLOOD PRESSURE SUPPORT)
PREFILLED_SYRINGE | INTRAVENOUS | Status: AC
  Filled 2020-04-11: qty 10

## 2020-04-11 MED ORDER — SODIUM CHLORIDE 0.9% FLUSH: 3.0000 mL | INTRAVENOUS | Status: DC | PRN

## 2020-04-11 MED ORDER — DEXAMETHASONE SODIUM PHOSPHATE 10 MG/ML IJ SOLN
INTRAMUSCULAR | Status: DC | PRN
  Administered 2020-04-11: 10 mg via INTRAVENOUS

## 2020-04-11 MED ORDER — OMEPRAZOLE MAGNESIUM 20 MG PO TBEC: 20.0000 mg | DELAYED_RELEASE_TABLET | Freq: Every day | ORAL | Status: DC

## 2020-04-11 MED ORDER — LIDOCAINE-EPINEPHRINE 1 %-1:100000 IJ SOLN
INTRAMUSCULAR | Status: AC
  Filled 2020-04-11: qty 1

## 2020-04-11 MED ORDER — 0.9 % SODIUM CHLORIDE (POUR BTL) OPTIME
TOPICAL | Status: DC | PRN
  Administered 2020-04-11: 1000 mL

## 2020-04-11 MED ORDER — THROMBIN 5000 UNITS EX SOLR
CUTANEOUS | Status: AC
  Filled 2020-04-11: qty 5000

## 2020-04-11 MED ORDER — SODIUM CHLORIDE 0.9 % IV SOLN: 250.0000 mL | INTRAVENOUS | Status: DC

## 2020-04-11 MED ORDER — BUPIVACAINE HCL (PF) 0.5 % IJ SOLN
INTRAMUSCULAR | Status: DC | PRN
  Administered 2020-04-11: 5 mL
  Administered 2020-04-11: 25 mL

## 2020-04-11 MED ORDER — ONDANSETRON HCL 4 MG/2ML IJ SOLN
INTRAMUSCULAR | Status: DC | PRN
  Administered 2020-04-11: 4 mg via INTRAVENOUS

## 2020-04-11 MED ORDER — ACETAMINOPHEN 500 MG PO TABS
1000.0000 mg | ORAL_TABLET | Freq: Once | ORAL | Status: AC
  Administered 2020-04-11: 1000 mg via ORAL
  Filled 2020-04-11: qty 2

## 2020-04-11 MED ORDER — ONDANSETRON HCL 4 MG PO TABS: 4.0000 mg | ORAL_TABLET | Freq: Four times a day (QID) | ORAL | Status: DC | PRN

## 2020-04-11 MED ORDER — BUPIVACAINE HCL (PF) 0.5 % IJ SOLN
INTRAMUSCULAR | Status: AC
  Filled 2020-04-11: qty 30

## 2020-04-11 MED ORDER — ACETAMINOPHEN 650 MG RE SUPP: 650.0000 mg | RECTAL | Status: DC | PRN

## 2020-04-11 MED ORDER — CYCLOSPORINE 0.05 % OP EMUL
1.0000 [drp] | Freq: Two times a day (BID) | OPHTHALMIC | Status: DC
  Administered 2020-04-12 (×2): 1 [drp] via OPHTHALMIC
  Filled 2020-04-11 (×4): qty 1

## 2020-04-11 SURGICAL SUPPLY — 72 items
BASKET BONE COLLECTION (BASKET) ×3 IMPLANT
BLADE CLIPPER SURG (BLADE) IMPLANT
BONE MATRIX OSTEOCEL PRO MED (Bone Implant) ×3 IMPLANT
BUR MATCHSTICK NEURO 3.0 LAGG (BURR) ×3 IMPLANT
CAGE PLIF 8X9X23-12 LUMBAR (Cage) ×6 IMPLANT
CANISTER SUCT 3000ML PPV (MISCELLANEOUS) ×3 IMPLANT
CNTNR URN SCR LID CUP LEK RST (MISCELLANEOUS) ×1 IMPLANT
CONT SPEC 4OZ STRL OR WHT (MISCELLANEOUS) ×3
COVER BACK TABLE 60X90IN (DRAPES) ×3 IMPLANT
COVER WAND RF STERILE (DRAPES) IMPLANT
DECANTER SPIKE VIAL GLASS SM (MISCELLANEOUS) ×6 IMPLANT
DERMABOND ADVANCED (GAUZE/BANDAGES/DRESSINGS) ×2
DERMABOND ADVANCED .7 DNX12 (GAUZE/BANDAGES/DRESSINGS) ×1 IMPLANT
DEVICE DISSECT PLASMABLAD 3.0S (MISCELLANEOUS) ×1 IMPLANT
DRAPE C-ARM 42X72 X-RAY (DRAPES) ×3 IMPLANT
DRAPE HALF SHEET 40X57 (DRAPES) IMPLANT
DRAPE LAPAROTOMY 100X72X124 (DRAPES) ×3 IMPLANT
DRSG OPSITE POSTOP 4X6 (GAUZE/BANDAGES/DRESSINGS) IMPLANT
DRSG OPSITE POSTOP 4X8 (GAUZE/BANDAGES/DRESSINGS) ×3 IMPLANT
DURAPREP 26ML APPLICATOR (WOUND CARE) ×3 IMPLANT
DURASEAL APPLICATOR TIP (TIP) IMPLANT
DURASEAL SPINE SEALANT 3ML (MISCELLANEOUS) IMPLANT
ELECT REM PT RETURN 9FT ADLT (ELECTROSURGICAL) ×3
ELECTRODE REM PT RTRN 9FT ADLT (ELECTROSURGICAL) ×1 IMPLANT
GAUZE 4X4 16PLY RFD (DISPOSABLE) IMPLANT
GAUZE SPONGE 4X4 12PLY STRL (GAUZE/BANDAGES/DRESSINGS) IMPLANT
GLOVE BIO SURGEON STRL SZ 6.5 (GLOVE) IMPLANT
GLOVE BIO SURGEON STRL SZ7.5 (GLOVE) ×3 IMPLANT
GLOVE BIO SURGEON STRL SZ8 (GLOVE) ×3 IMPLANT
GLOVE BIO SURGEONS STRL SZ 6.5 (GLOVE)
GLOVE BIOGEL PI IND STRL 6.5 (GLOVE) ×1 IMPLANT
GLOVE BIOGEL PI IND STRL 7.0 (GLOVE) ×1 IMPLANT
GLOVE BIOGEL PI IND STRL 8 (GLOVE) ×1 IMPLANT
GLOVE BIOGEL PI IND STRL 8.5 (GLOVE) ×2 IMPLANT
GLOVE BIOGEL PI INDICATOR 6.5 (GLOVE) ×2
GLOVE BIOGEL PI INDICATOR 7.0 (GLOVE) ×2
GLOVE BIOGEL PI INDICATOR 8 (GLOVE) ×2
GLOVE BIOGEL PI INDICATOR 8.5 (GLOVE) ×4
GLOVE ECLIPSE 8.5 STRL (GLOVE) ×6 IMPLANT
GLOVE SURG SS PI 6.0 STRL IVOR (GLOVE) ×12 IMPLANT
GLOVE SURG SS PI 6.5 STRL IVOR (GLOVE) ×6 IMPLANT
GOWN STRL REUS W/ TWL LRG LVL3 (GOWN DISPOSABLE) ×2 IMPLANT
GOWN STRL REUS W/ TWL XL LVL3 (GOWN DISPOSABLE) ×1 IMPLANT
GOWN STRL REUS W/TWL 2XL LVL3 (GOWN DISPOSABLE) ×6 IMPLANT
GOWN STRL REUS W/TWL LRG LVL3 (GOWN DISPOSABLE) ×6
GOWN STRL REUS W/TWL XL LVL3 (GOWN DISPOSABLE) ×3
HEMOSTAT POWDER KIT SURGIFOAM (HEMOSTASIS) ×3 IMPLANT
KIT BASIN OR (CUSTOM PROCEDURE TRAY) ×3 IMPLANT
KIT TURNOVER KIT B (KITS) ×3 IMPLANT
MILL MEDIUM DISP (BLADE) ×3 IMPLANT
NEEDLE HYPO 22GX1.5 SAFETY (NEEDLE) ×3 IMPLANT
NS IRRIG 1000ML POUR BTL (IV SOLUTION) ×3 IMPLANT
PACK LAMINECTOMY NEURO (CUSTOM PROCEDURE TRAY) ×3 IMPLANT
PAD ARMBOARD 7.5X6 YLW CONV (MISCELLANEOUS) ×15 IMPLANT
PATTIES SURGICAL .5 X1 (DISPOSABLE) IMPLANT
PLASMABLADE 3.0S (MISCELLANEOUS) ×3
ROD RELINE-O LORDOTIC 5.5X60MM (Rod) ×6 IMPLANT
SCREW LOCK RELINE 5.5 TULIP (Screw) ×6 IMPLANT
SCREW RELINE-O POLY 6.5X45 (Screw) ×6 IMPLANT
SPONGE LAP 4X18 RFD (DISPOSABLE) IMPLANT
SPONGE SURGIFOAM ABS GEL 100 (HEMOSTASIS) ×3 IMPLANT
SUT PROLENE 6 0 BV (SUTURE) IMPLANT
SUT VIC AB 1 CT1 18XBRD ANBCTR (SUTURE) ×1 IMPLANT
SUT VIC AB 1 CT1 8-18 (SUTURE) ×3
SUT VIC AB 2-0 CP2 18 (SUTURE) ×3 IMPLANT
SUT VIC AB 3-0 SH 8-18 (SUTURE) ×3 IMPLANT
SUT VIC AB 4-0 RB1 18 (SUTURE) ×3 IMPLANT
SYR 3ML LL SCALE MARK (SYRINGE) ×12 IMPLANT
TOWEL GREEN STERILE (TOWEL DISPOSABLE) ×3 IMPLANT
TOWEL GREEN STERILE FF (TOWEL DISPOSABLE) ×3 IMPLANT
TRAY FOLEY MTR SLVR 16FR STAT (SET/KITS/TRAYS/PACK) ×3 IMPLANT
WATER STERILE IRR 1000ML POUR (IV SOLUTION) ×3 IMPLANT

## 2020-04-11 NOTE — H&P (Signed)
Terry [name] is an 69 y.o. male.   Chief Complaint: Back and right lower extremity pain HPI: Terry [name] is a 69 year old individual who has had significant spondylosis and stenosis in the past.  He is undergone of decompression and fusion at L3-4.  He then had a herniated nucleus pulposus in the foraminal zone at L4-L5.  Microdiscectomy was performed and the patient felt better for a brief period of time but then he had a early recurrence of the disc herniation at L4-5 on the right.  He is also endorsed significant spondylitic degeneration with a grade 1 anterolisthesis that has developed at that level.  After careful consideration of his options I advised surgical decompression arthrodesis.  He is now taken to the operating room for that procedure.  Past Medical History:  Diagnosis Date  . Arthritis    neck, back, knees, right ankle  . Bladder calculus   . Chronic back pain   . Complication of anesthesia    dizziness and "crazy amount of bile"  . GERD (gastroesophageal reflux disease)   . History of colon polyps    benign  . History of kidney stones    and bladder stone  . History of prostate cancer urologist-- dr Alinda Money   dx 2014---  s/p  prostatectomy 05-22-2013 ,  Gleason 3+3  . Hypercholesterolemia    takes Crestor daily  . Hyperlipidemia   . Pneumonia 10/2019  . Pulmonary emboli ([place]) 10/2019  . Wears glasses     Past Surgical History:  Procedure Laterality Date  . ACHILLES TENDON SURGERY Right 06-07-2018   @[facility]    reconstruction  . ANTERIOR CERVICAL DECOMP/DISCECTOMY FUSION N/A 06/27/2014   Procedure: ANTERIOR CERVICAL DECOMPRESSION/DISCECTOMY FUSION C5-C7   (2 LEVELS);  Surgeon: Melina Schools, MD;  Location: [facility];  Service: Orthopedics;  Laterality: N/A;  . CARDIAC CATHETERIZATION  2004  . COLONOSCOPY    . CYSTOSCOPY WITH LITHOLAPAXY N/A 03/27/2019   Procedure: CYSTOSCOPY WITH REMOVAL OF BLADDER STONE AND FOREIGN BODY;  Surgeon: Raynelle Bring, MD;  Location: [facility];  Service: Urology;  Laterality: N/A;  . EYE SURGERY     lazy eye as child, lasik surgery  . FINGER SURGERY Right    4th finger  . I & D EXTREMITY Right 07/10/2018   Procedure: IRRIGATION AND DEBRIDEMENT RIGHT ANKLE WOUND AND WOUND VAC PLACEMENT;  Surgeon: Wylene Simmer, MD;  Location: [facility];  Service: Orthopedics;  Laterality: Right;  . KNEE ARTHROSCOPY Bilateral right ?;  left 05-25-2007  @[facility]   . POSTERIOR LAMINECTOMY / DECOMPRESSION LUMBAR SPINE  08-31-2016    dr Davontae Prusinski  @[facility]   . PROSTATE BIOPSY  04/13/13   gleason 3+3=6, vol 55.4 cc  . ROBOT ASSISTED LAPAROSCOPIC RADICAL PROSTATECTOMY N/A 05/22/2013   Procedure: ROBOTIC ASSISTED LAPAROSCOPIC RADICAL PROSTATECTOMY LEVEL 1;  Surgeon: Dutch Gray, MD;  Location: [facility];  Service: Urology;  Laterality: N/A;  . TONSILLECTOMY      Family History  Problem Relation Age of Onset  . Pneumonia Mother        bacterial  . Dementia Mother   . Heart disease Father   . Heart attack Father   . Colon cancer Neg Hx    Social History:  reports that he has never smoked. He has never used smokeless tobacco. He reports current alcohol use. He reports that he does not use drugs.  Allergies:  Allergies  Allergen Reactions  . Penicillins Hives, Itching and Other (See Comments)    Has patient  had a PCN reaction causing immediate rash, facial/tongue/throat swelling, SOB or lightheadedness with hypotension: No Has patient had a PCN reaction causing SEVERE RASH INVOLVING MUCUS MEMBRANES or SKIN NECROSIS: #  #  #  YES  #  #  #  Has patient had a PCN reaction that required hospitalization No Has patient had a PCN reaction occurring within the last 10 years: No     Medications Prior to Admission  Medication Sig Dispense Refill  . acetaminophen (TYLENOL) 325 MG tablet Take 650 mg by mouth every 6 (six) hours as needed for moderate pain.     . cetirizine (ZYRTEC) 10 MG tablet Take 10 mg by mouth daily as needed for allergies.    .  cycloSPORINE (RESTASIS) 0.05 % ophthalmic emulsion Place 1 drop into both eyes 2 (two) times daily.     . methocarbamol (ROBAXIN) 500 MG tablet Take 1 tablet (500 mg total) by mouth every 6 (six) hours as needed for muscle spasms. 40 tablet 3  . Multiple Vitamin (MULTIVITAMIN) tablet Take 1 tablet by mouth daily.    Marland Kitchen omeprazole (PRILOSEC OTC) 20 MG tablet Take 20 mg by mouth at bedtime.     . rivaroxaban (XARELTO) 20 MG TABS tablet Take 20 mg by mouth daily with breakfast.     . gabapentin (NEURONTIN) 300 MG capsule Take 300 mg by mouth at bedtime as needed. (Patient not taking: Reported on 02/15/2020)    . naproxen sodium (ALEVE) 220 MG tablet Take 220 mg by mouth daily as needed (pain). (Patient not taking: Reported on 02/15/2020)    . phenazopyridine (PYRIDIUM) 200 MG tablet Take 1 tablet (200 mg total) by mouth 3 (three) times daily as needed for pain. (Patient not taking: Reported on 02/15/2020) 20 tablet 0  . rosuvastatin (CRESTOR) 20 MG tablet Take 20 mg by mouth at bedtime. (Patient not taking: Reported on 02/15/2020)      No results found for this or any previous visit (from the past 48 hour(s)). No results found.  Review of Systems  Constitutional: Positive for activity change.  HENT: Negative.   Eyes: Negative.   Respiratory: Negative.   Cardiovascular: Negative.   Gastrointestinal: Negative.   Endocrine: Negative.   Genitourinary: Negative.   Musculoskeletal: Positive for back pain and gait problem.  Allergic/Immunologic: Negative.   Neurological: Positive for weakness and numbness.  Hematological: Bruises/bleeds easily.  Psychiatric/Behavioral: Negative.     Blood pressure 125/78, pulse 73, temperature 98.6 F (37 C), resp. rate 17, height 5\' 7"  (1.702 m), weight 78.6 kg, SpO2 100 %. Physical Exam Constitutional:      Appearance: Normal appearance.  HENT:     Right Ear: Tympanic membrane normal.     Left Ear: Tympanic membrane normal.     Nose: Nose normal.      Mouth/Throat:     Mouth: Mucous membranes are moist.  Eyes:     Pupils: Pupils are equal, round, and reactive to light.  Cardiovascular:     Rate and Rhythm: Normal rate and regular rhythm.     Heart sounds: Normal heart sounds.  Pulmonary:     Effort: Pulmonary effort is normal.     Breath sounds: Normal breath sounds.  Abdominal:     General: Abdomen is flat.     Palpations: Abdomen is soft.  Musculoskeletal:     Cervical back: Normal range of motion and neck supple.     Comments: Positive straight leg raising at 30 degrees on the right 45 degrees  on the left Patrick's maneuver is negative bilaterally.  Mild tenderness to palpation percussion of the lumbar spine.  Skin:    General: Skin is warm and dry.  Neurological:     Mental Status: He is alert.     Comments: Cranial nerve examination is normal.  Upper extremity strength is normal.  Lower extremity strength reveals mild weakness in the tibialis anterior on the right.  Absent patellar reflex absent Achilles reflexes also noted on the right and the left.  Sensation is diminished in the right lower extremity on anterolateral border.  Psychiatric:        Mood and Affect: Mood normal.        Behavior: Behavior normal.      Assessment/Plan Spondylolisthesis L4-5 with recurrent disc herniation L4-5 history of fusion L3-4.  Plan: Decompression fusion L4-5.  Earleen Newport, MD 04/11/2020, 7:41 AM

## 2020-04-11 NOTE — Progress Notes (Signed)
Orthopedic Tech Progress Note Patient Details:  Terry [name] 1951-04-28 [id number] RN said Patient has brace. Patient ID: Terry [name], male   DOB: September 18, 1950, 69 y.o.   MRN: [id number]   Chip Boer 04/11/2020, 2:45 PM

## 2020-04-11 NOTE — Anesthesia Postprocedure Evaluation (Signed)
Anesthesia Post Note  Patient: Terry [name]  Procedure(s) Performed: Lumbar Four-Five Posterior lumbar interbody fusion (N/A Spine Lumbar)     Patient location during evaluation: PACU Anesthesia Type: General Level of consciousness: awake Pain management: pain level controlled Vital Signs Assessment: post-procedure vital signs reviewed and stable Respiratory status: spontaneous breathing, nonlabored ventilation, respiratory function stable and patient connected to nasal cannula oxygen Cardiovascular status: blood pressure returned to baseline and stable Postop Assessment: no apparent nausea or vomiting Anesthetic complications: no   No complications documented.  Last Vitals:  Vitals:   04/11/20 1330 04/11/20 1400  BP: 134/76 (!) 146/75  Pulse:  66  Resp: 16 20  Temp: (!) 36.3 C 36.6 C  SpO2:  100%    Last Pain:  Vitals:   04/11/20 1400  TempSrc: Oral  PainSc:                  Amyrah Pinkhasov P Katianna Mcclenney

## 2020-04-11 NOTE — Transfer of Care (Signed)
Immediate Anesthesia Transfer of Care Note  Patient: Terry [name]  Procedure(s) Performed: Lumbar Four-Five Posterior lumbar interbody fusion (N/A Spine Lumbar)  Patient Location: PACU  Anesthesia Type:General  Level of Consciousness: awake, alert , oriented and patient cooperative  Airway & Oxygen Therapy: Patient Spontanous Breathing and Patient connected to face mask oxygen  Post-op Assessment: Report given to RN, Post -op Vital signs reviewed and stable and Patient moving all extremities  Post vital signs: Reviewed and stable  Last Vitals:  Vitals Value Taken Time  BP 141/69 04/11/20 1115  Temp 36.6 C 04/11/20 1115  Pulse 73 04/11/20 1117  Resp 19 04/11/20 1117  SpO2 100 % 04/11/20 1117  Vitals shown include unvalidated device data.  Last Pain:  Vitals:   04/11/20 1115  PainSc: Asleep         Complications: No complications documented.

## 2020-04-11 NOTE — Op Note (Signed)
Date of surgery: 04/11/2020 Preoperative diagnosis: Recurrent herniated nucleus pulposus L4-L5 with spondylolisthesis and right lumbar radiculopathy L4-L5.  History of fusion L3-L4. Postoperative diagnosis: Same Procedure: Posterior decompression L4-L5 with arthrodesis using interbody technique.  Discectomy both intradiscal and epidural to decompress the L4 nerve root on the right.  Posterior lateral arthrodesis with local autograft and allograft.  Pedicle screw fixation L3-L5.  With revision of hardware from L3-4 fusion. Surgeon: Kristeen Miss First Assistant: Elwin Sleight, DO Anesthesia: General endotracheal Indications: Terry [name] is a 69 year old individuals had significant back and right lower extremity pain.  He had a microdiscectomy at L4-5 which seem to give him relief for only a few days time pain recurred and he subsequently had worsening problems and was found to have an early recurrence of the disc in the foraminal and extraforaminal zone with a fragment of disc behind the vertebral body of L4.  This was on the right side.  It also appears that he developed a significant spondylolisthesis at the L4-L5 level.  I advised surgical revision with decompression and arthrodesis of the L4-5 level.  He is now admitted for that procedure.  Procedure: Patient was brought to the operating room supine on the stretcher.  After the smooth induction of general tracheal anesthesia he was carefully turned prone.  The back was prepped with alcohol DuraPrep and draped in a sterile fashion.  Previously made midline incision was reopened and carried down to the lumbodorsal fascia.  Fascia was opened on either side of midline to expose the hardware at L3-L4 and then dissection was carried slightly inferiorly to expose the interlaminar space of L4-L5.  Once this area was secured laminotomies were created removing the inferior margin lamina of L4 out to including the entirety of the facet at L4-L5.  This was first done  on the right side and here there was noted to be significant epidural fibrosis but on further dissection the epidural space behind the root of L4 identified several fragments of disc material these were excised.  Then the disc space was entered and in the subligamentous space there was noted to be extensive disc material that was herniated behind the vertebral body of L4.  This was carefully decompressed and that the space was entered and a total discectomy was performed a series of curettes and rongeurs was used to facilitate this.  Once the right side was decompressed an interbody spacer was placed on that side and the left side was open by creating a laminotomy removing the inferior margin lamina of L4 out to and including the entirety of the facet on that side thickened redundant yellow ligament was taken up in the common dural tube in the L4 nerve root superiorly L5 nerve root inferiorly were identified the disc space was noted to be bulging dorsally and it was incised and complete discectomy was then performed on the left side.  Once all the disc material had been evacuated the interspace was sized and opened to allow placement of an 8 x 9 x 23 mm cage with 12 degrees of lordosis.  The interbody space was packed with a total of 16 cc of bone graft this was a combination of autograft and allograft in the form of ostia cell.  Once this was completed lateral gutters were then decorticated between L4 and L5 and they were packed with an additional 4 cc of bone graft on either side.  Again once this was completed a pedicle entry sites were chosen at L5 and 6.5  x 45 mm screws were placed in the pedicles at L5 5.  Previous rod screw construct at L3-4 was loosened and the rod was removed and 60 mm precontoured rods were placed between L3-L4 and L5.  Final radiographs were then obtained and hemostasis in the soft tissues was obtained meticulously total blood loss for the procedure was estimated 250 cc 25 cc of half  percent Marcaine was injected into the paraspinous fascia and the fascia was closed with #1 Vicryl in interrupted fashion 2-0 Vicryl was used in the subcutaneous and subcuticular tissues 3-0 Vicryl subcuticularly with some 4-0 Vicryl in the most superficial subcuticular layer.  Care was taken before the closure was performed to make sure that the pads of the L4 nerve root superiorly the L5 V nerve root inferiorly were well decompressed as was the dural tube.

## 2020-04-11 NOTE — Evaluation (Signed)
Physical Therapy Evaluation Patient Details Name: Terry [name] MRN: [id number] DOB: 1950-09-03 Today's Date: 04/11/2020   History of Present Illness  pt is a 69 y/o male with pmh significant for prostate CA, HLD, hay fever, GERD, chronic back pain, asthma, R achilles tendon repair, B knee arthroscopy,R finger surgery, cardiac cath 2004, ACDF 2016, s/p L45 interbody fusion with decompression and grafting due to recurrent HNP with radiculopathy.  Clinical Impression  Pt admitted with/for lumbar decompression and fusion.  Pt needing min guard to min for general mobility and gait.  Pt fully education on back care/precaution, but needs reinforcement.  Pt currently limited functionally due to the problems listed below.  (see problems list.)  Pt will benefit from PT to maximize function and safety to be able to get home safely with available assist.     Follow Up Recommendations No PT follow up    Equipment Recommendations  None recommended by PT (TBD)    Recommendations for Other Services       Precautions / Restrictions Precautions Precautions: Back Precaution Booklet Issued: Yes (comment) Required Braces or Orthoses: Spinal Brace Spinal Brace: Lumbar corset;Applied in sitting position Restrictions Weight Bearing Restrictions: No      Mobility  Bed Mobility Overal bed mobility: Needs Assistance Bed Mobility: Rolling;Sidelying to Sit;Sit to Sidelying Rolling: Min guard Sidelying to sit: Min guard;Min assist     Sit to sidelying: Min guard;Min assist General bed mobility comments: cued for better technique, practiced improving technique,     Transfers Overall transfer level: Needs assistance   Transfers: Sit to/from Stand Sit to Stand: Min guard         General transfer comment: cues for hand placement  Ambulation/Gait Ambulation/Gait assistance: Min guard Gait Distance (Feet): 300 Feet Assistive device: IV Pole;None Gait Pattern/deviations: Step-through  pattern Gait velocity: slow with little change in ability to increase speed Gait velocity interpretation: <1.31 ft/sec, indicative of household ambulator General Gait Details: short, stiff, flat-footed gait with little toe off or heel contact  Stairs Stairs: Yes Stairs assistance: Min guard Stair Management: One rail Left;Step to pattern;Forwards Number of Stairs: 3    Wheelchair Mobility    Modified Rankin (Stroke Patients Only)       Balance Overall balance assessment: No apparent balance deficits (not formally assessed)                                           Pertinent Vitals/Pain Pain Assessment: Faces Faces Pain Scale: Hurts a little bit Pain Location: incisional Pain Descriptors / Indicators: Discomfort;Grimacing;Guarding Pain Intervention(s): Monitored during session    Home Living Family/patient expects to be discharged to:: Private residence Living Arrangements: Spouse/significant other Available Help at Discharge: Family;Available 24 hours/day Type of Home: House Home Access: Level entry     Home Layout: One level        Prior Function Level of Independence: Independent         Comments: drove, ran errands     Hand Dominance        Extremity/Trunk Assessment   Upper Extremity Assessment Upper Extremity Assessment: Overall WFL for tasks assessed    Lower Extremity Assessment Lower Extremity Assessment: Overall WFL for tasks assessed       Communication   Communication: No difficulties  Cognition Arousal/Alertness: Awake/alert Behavior During Therapy: WFL for tasks assessed/performed Overall Cognitive Status: Within Functional Limits for tasks assessed  General Comments General comments (skin integrity, edema, etc.): pt instructed in back care/prec, log roll/transitions side to/from sitting, lifting precautions, progression of activity.    Exercises      Assessment/Plan    PT Assessment Patient needs continued PT services  PT Problem List Decreased activity tolerance;Decreased mobility;Decreased knowledge of precautions;Pain       PT Treatment Interventions DME instruction;Gait training;Functional mobility training;Therapeutic activities;Patient/family education    PT Goals (Current goals can be found in the Care Plan section)  Acute Rehab PT Goals Patient Stated Goal: decrease pain, back independent PT Goal Formulation: With patient Time For Goal Achievement: 04/20/20 Potential to Achieve Goals: Good    Frequency Min 5X/week   Barriers to discharge        Co-evaluation               AM-PAC PT "6 Clicks" Mobility  Outcome Measure Help needed turning from your back to your side while in a flat bed without using bedrails?: A Little Help needed moving from lying on your back to sitting on the side of a flat bed without using bedrails?: A Little Help needed moving to and from a bed to a chair (including a wheelchair)?: A Little Help needed standing up from a chair using your arms (e.g., wheelchair or bedside chair)?: A Little Help needed to walk in hospital room?: A Little Help needed climbing 3-5 steps with a railing? : A Little 6 Click Score: 18    End of Session Equipment Utilized During Treatment: Back brace Activity Tolerance: Patient tolerated treatment well Patient left: in bed;with call bell/phone within reach;with SCD's reapplied Nurse Communication: Mobility status PT Visit Diagnosis: Unsteadiness on feet (R26.81);Other abnormalities of gait and mobility (R26.89);Pain Pain - part of body:  (incisional)    Time: 1610-1640 PT Time Calculation (min) (ACUTE ONLY): 30 min   Charges:   PT Evaluation $PT Eval Moderate Complexity: 1 Mod PT Treatments $Gait Training: 8-22 mins        04/11/2020  Ginger Carne., PT Acute Rehabilitation Services [phone]  (pager) [phone]  (office)  Tessie Fass  Felma Pfefferle 04/11/2020, 5:00 PM

## 2020-04-11 NOTE — Anesthesia Procedure Notes (Signed)
Procedure Name: Intubation Date/Time: 04/11/2020 7:54 AM Performed by: Myna Bright, CRNA Pre-anesthesia Checklist: Patient identified, Emergency Drugs available, Suction available and Patient being monitored Patient Re-evaluated:Patient Re-evaluated prior to induction Oxygen Delivery Method: Circle system utilized Preoxygenation: Pre-oxygenation with 100% oxygen Induction Type: IV induction Ventilation: Mask ventilation without difficulty Laryngoscope Size: Mac and 4 Grade View: Grade I Tube type: Oral Tube size: 7.5 mm Number of attempts: 1 Airway Equipment and Method: Stylet Placement Confirmation: ETT inserted through vocal cords under direct vision,  positive ETCO2 and breath sounds checked- equal and bilateral Secured at: 22 cm Tube secured with: Tape Dental Injury: Teeth and Oropharynx as per pre-operative assessment

## 2020-04-12 LAB — BASIC METABOLIC PANEL
Anion gap: 10 (ref 5–15)
BUN: 16 mg/dL (ref 8–23)
CO2: 24 mmol/L (ref 22–32)
Calcium: 8.9 mg/dL (ref 8.9–10.3)
Chloride: 102 mmol/L (ref 98–111)
Creatinine, Ser: 1.03 mg/dL (ref 0.61–1.24)
GFR, Estimated: >60 mL/min (ref >60)
Glucose, Bld: 146 mg/dL — ABNORMAL HIGH (ref 70–99)
Potassium: 3.8 mmol/L (ref 3.5–5.1)
Sodium: 136 mmol/L (ref 135–145)

## 2020-04-12 LAB — CBC
HCT: 40.7 % (ref 39.0–52.0)
Hemoglobin: 13.3 g/dL (ref 13.0–17.0)
MCH: 29 pg (ref 26.0–34.0)
MCHC: 32.7 g/dL (ref 30.0–36.0)
MCV: 88.9 fL (ref 80.0–100.0)
Platelets: 246 10*3/uL (ref 150–400)
RBC: 4.58 MIL/uL (ref 4.22–5.81)
RDW: 13 % (ref 11.5–15.5)
WBC: 15.8 10*3/uL — ABNORMAL HIGH (ref 4.0–10.5)
nRBC: 0 % (ref 0.0–0.2)

## 2020-04-12 MED ORDER — MAGNESIUM CITRATE PO SOLN
1.0000 | Freq: Two times a day (BID) | ORAL | Status: DC | PRN
  Administered 2020-04-12: 1 via ORAL
  Filled 2020-04-12: qty 296

## 2020-04-12 MED ORDER — OXYCODONE-ACETAMINOPHEN 5-325 MG PO TABS
1.0000 | ORAL_TABLET | ORAL | 0 refills | Status: DC | PRN
Start: 2020-04-12 — End: 2021-07-07

## 2020-04-12 MED ORDER — METHOCARBAMOL 500 MG PO TABS: 500.0000 mg | ORAL_TABLET | Freq: Four times a day (QID) | ORAL | 3 refills | Status: DC | PRN

## 2020-04-12 NOTE — Progress Notes (Addendum)
Physical Therapy Treatment Patient Details Name: Terry [name] MRN: [id number] DOB: 01-17-1951 Today's Date: 04/12/2020    History of Present Illness pt is a 69 y/o male with pmh significant for prostate CA, HLD, hay fever, GERD, chronic back pain, asthma, R achilles tendon repair, B knee arthroscopy,R finger surgery, cardiac cath 2004, ACDF 2016, s/p L45 interbody fusion with decompression and grafting due to recurrent HNP with radiculopathy.    PT Comments    Pt supine in bed and eager to mobilize.  He appears more foggy today and required review of spinal precautions and assistance with brace application.  He performed increased gt and stair training with good tolerance but was noted to be more fatigued during stair training. Continue to recommend no PT follow up.      Follow Up Recommendations  No PT follow up     Equipment Recommendations  None recommended by PT (TBD)    Recommendations for Other Services       Precautions / Restrictions Precautions Precautions: Back Precaution Booklet Issued: Yes (comment) Required Braces or Orthoses: Spinal Brace Spinal Brace: Lumbar corset;Applied in sitting position Restrictions Weight Bearing Restrictions: No    Mobility  Bed Mobility Overal bed mobility: Needs Assistance Bed Mobility: Rolling;Sidelying to Sit Rolling: Modified independent (Device/Increase time)         General bed mobility comments: No assistance needed for rolling and moving to edge of bed.  Transfers Overall transfer level: Needs assistance Equipment used: None Transfers: Sit to/from Stand Sit to Stand: Supervision         General transfer comment: for safety  Ambulation/Gait Ambulation/Gait assistance: Supervision Gait Distance (Feet): 350 Feet Assistive device: None Gait Pattern/deviations: Step-through pattern;Trunk flexed;Wide base of support     General Gait Details: Cues for reciprocal armswing, head control and scapular retraction to  improve posture.   Stairs Stairs: Yes Stairs assistance: Min guard Stair Management: One rail Left;Step to pattern;Forwards;One rail Right Number of Stairs: 10 General stair comments: R rail to ascend and L rail to descend, noticeable fatigue and top of stairs.   Wheelchair Mobility    Modified Rankin (Stroke Patients Only)       Balance Overall balance assessment: No apparent balance deficits (not formally assessed)                                          Cognition Arousal/Alertness: Awake/alert Behavior During Therapy: WFL for tasks assessed/performed Overall Cognitive Status: Within Functional Limits for tasks assessed                                        Exercises      General Comments        Pertinent Vitals/Pain Pain Assessment: Faces Faces Pain Scale: Hurts little more Pain Location: incisional Pain Descriptors / Indicators: Discomfort;Grimacing;Guarding Pain Intervention(s): Monitored during session;Repositioned    Home Living                      Prior Function            PT Goals (current goals can now be found in the care plan section) Acute Rehab PT Goals Patient Stated Goal: decrease pain, back independent Potential to Achieve Goals: Good Progress towards PT goals: Progressing toward goals  Frequency    Min 5X/week      PT Plan Current plan remains appropriate    Co-evaluation              AM-PAC PT "6 Clicks" Mobility   Outcome Measure  Help needed turning from your back to your side while in a flat bed without using bedrails?: A Little Help needed moving from lying on your back to sitting on the side of a flat bed without using bedrails?: A Little Help needed moving to and from a bed to a chair (including a wheelchair)?: A Little Help needed standing up from a chair using your arms (e.g., wheelchair or bedside chair)?: A Little Help needed to walk in hospital room?: A  Little Help needed climbing 3-5 steps with a railing? : A Little 6 Click Score: 18    End of Session Equipment Utilized During Treatment: Back brace Activity Tolerance: Patient tolerated treatment well Patient left: in bed;with call bell/phone within reach;with SCD's reapplied Nurse Communication: Mobility status PT Visit Diagnosis: Unsteadiness on feet (R26.81);Other abnormalities of gait and mobility (R26.89);Pain Pain - part of body:  (incisional)     Time: 4010-2725 PT Time Calculation (min) (ACUTE ONLY): 12 min  Charges:  $Gait Training: 8-22 mins                     Erasmo Leventhal , PTA Acute Rehabilitation Services Pager [phone] Office [phone]     Lucette Kratz Eli Hose 04/12/2020, 12:53 PM

## 2020-04-12 NOTE — Evaluation (Signed)
Occupational Therapy Evaluation Patient Details Name: Terry [name] MRN: [id number] DOB: November 17, 1950 Today's Date: 04/12/2020    History of Present Illness pt is a 69 y/o male with pmh significant for prostate CA, HLD, hay fever, GERD, chronic back pain, asthma, R achilles tendon repair, B knee arthroscopy,R finger surgery, cardiac cath 2004, ACDF 2016, s/p L45 interbody fusion with decompression and grafting due to recurrent HNP with radiculopathy.   Clinical Impression   Patient educated in back precautions and how to adhere during ADLs. Patient reports unsure if going home today and was hesitant to get dressed. Demo's figure 4 method to doff socks seated EOB and supervision for donning bath robe. Patient supervision for toilet transfer and sink side g/h with report of feeling "a little woozy." BP taken once EOB 134/90. Patient require cues for improved technique for log roll back into bed. Recommend continued acute OT services in order to facilitate D/C to venue listed below.    Follow Up Recommendations  No OT follow up    Equipment Recommendations  None recommended by OT       Precautions / Restrictions Precautions Precautions: Back Precaution Booklet Issued: Yes (comment) Required Braces or Orthoses: Spinal Brace Spinal Brace: Lumbar corset;Applied in sitting position Restrictions Weight Bearing Restrictions: No      Mobility Bed Mobility Overal bed mobility: Needs Assistance Bed Mobility: Rolling;Sidelying to Sit;Sit to Sidelying Rolling: Supervision Sidelying to sit: Supervision     Sit to sidelying: Supervision General bed mobility comments: patient demonstrates adequate technique for getting OOB however required cues to improve technique log roll back to bed     Transfers Overall transfer level: Needs assistance Equipment used: None Transfers: Sit to/from Stand Sit to Stand: Supervision         General transfer comment: for safety    Balance Overall  balance assessment: No apparent balance deficits (not formally assessed)                                         ADL either performed or assessed with clinical judgement   ADL Overall ADL's : Needs assistance/impaired     Grooming: Oral care;Supervision/safety;Standing Grooming Details (indicate cue type and reason): supervision for safety as patient reports feeling woozy Upper Body Bathing: Supervision/ safety;Sitting   Lower Body Bathing: Supervison/ safety;Sit to/from stand   Upper Body Dressing : Supervision/safety;Sitting Upper Body Dressing Details (indicate cue type and reason): to don bath robe in standing Lower Body Dressing: Supervision/safety;Sitting/lateral leans Lower Body Dressing Details (indicate cue type and reason): patient demonstrates ability to do figure 4 to doff socks seated at [employer] Transfer: Supervision/safety;Ambulation Toilet Transfer Details (indicate cue type and reason): supervision for safety due to feeling woozy  Toileting- Clothing Manipulation and Hygiene: Supervision/safety       Functional mobility during ADLs: Supervision/safety General ADL Comments: instructed patient in back precautions and how to maintain during ADLs, patient reports "I have been dealing with this for 3 years" however does require min cues for adhering to precautions during session and is mildly impulsive                  Pertinent Vitals/Pain Pain Assessment: Faces Faces Pain Scale: Hurts little more Pain Location: incisional Pain Descriptors / Indicators: Discomfort;Grimacing;Guarding Pain Intervention(s): Monitored during session     Hand Dominance  (did not specify)   Extremity/Trunk Assessment Upper Extremity Assessment Upper  Extremity Assessment: Overall WFL for tasks assessed   Lower Extremity Assessment Lower Extremity Assessment: Defer to PT evaluation   Cervical / Trunk Assessment Cervical / Trunk Assessment: Normal    Communication Communication Communication: No difficulties   Cognition Arousal/Alertness: Awake/alert Behavior During Therapy: WFL for tasks assessed/performed Overall Cognitive Status: Within Functional Limits for tasks assessed                                     General Comments  BP taken upon return to EOB due to feeling woozy in bathroom reading 134/90            Home Living Family/patient expects to be discharged to:: Private residence Living Arrangements: Spouse/significant other Available Help at Discharge: Family;Available 24 hours/day Type of Home: House Home Access: Level entry     Home Layout: One level     Bathroom Shower/Tub: Walk-in shower;Tub/shower unit   Bathroom Toilet: Standard     Home Equipment: None          Prior Functioning/Environment Level of Independence: Independent        Comments: drove, ran errands        OT Problem List: Pain;Decreased activity tolerance;Decreased safety awareness      OT Treatment/Interventions: Self-care/ADL training;Therapeutic activities;DME and/or AE instruction;Patient/family education    OT Goals(Current goals can be found in the care plan section) Acute Rehab OT Goals Patient Stated Goal: decrease pain, back independent OT Goal Formulation: With patient Time For Goal Achievement: 04/26/20 Potential to Achieve Goals: Good  OT Frequency: Min 2X/week    AM-PAC OT "6 Clicks" Daily Activity     Outcome Measure Help from another person eating meals?: None Help from another person taking care of personal grooming?: A Little Help from another person toileting, which includes using toliet, bedpan, or urinal?: A Little Help from another person bathing (including washing, rinsing, drying)?: A Little Help from another person to put on and taking off regular upper body clothing?: A Little Help from another person to put on and taking off regular lower body clothing?: A Little 6 Click Score:  19   End of Session Nurse Communication: Mobility status;Other (comment) (felt woozy with mobilit)  Activity Tolerance: Patient tolerated treatment well Patient left: in bed;with call bell/phone within reach  OT Visit Diagnosis: Pain Pain - part of body:  (back)                Time: 4315-4008 OT Time Calculation (min): 19 min Charges:  OT General Charges $OT Visit: 1 Visit OT Evaluation $OT Eval Low Complexity: 1 Low  Delbert Phenix OT OT pager: [phone]  Rosemary Holms 04/12/2020, 8:51 AM

## 2020-04-12 NOTE — Discharge Summary (Signed)
Physician Discharge Summary  Patient ID: Terry [name] MRN: [id number] DOB/AGE: 10/16/1950 69 y.o.  Admit date: 04/11/2020 Discharge date: 04/13/2020  Admission Diagnoses: Recurrent herniated nucleus pulposus L4-L5 history of fusion L3-L4 lumbar radiculopathy history of pulmonary emboli  Discharge Diagnoses: Recurrent herniated nucleus pulposus L4-L5 history of fusion L3-L4 lumbar radiculopathy, history of pulmonary emboli.Active Problems:   Spondylolisthesis of lumbar region   Discharged Condition: good  Hospital Course: Patient was admitted to undergo surgical decompression and arthrodesis at L4-L5.  He tolerated surgery well.  Consults: None  Significant Diagnostic Studies: Incision is clean and dry motor function is intact.  Treatments: None  Discharge Exam: Blood pressure 122/75, pulse 90, temperature 98.3 F (36.8 C), temperature source Oral, resp. rate 18, height 5\' 7"  (1.702 m), weight 78.6 kg, SpO2 97 %. Incision is clean and dry motor function is intact.  Disposition: Discharge disposition: 01-Home or Self Care       Discharge Instructions    Call MD for:  redness, tenderness, or signs of infection (pain, swelling, redness, odor or green/yellow discharge around incision site)   Complete by: As directed    Call MD for:  severe uncontrolled pain   Complete by: As directed    Call MD for:  temperature >100.4   Complete by: As directed    Diet - low sodium heart healthy   Complete by: As directed    Diet general   Complete by: As directed    Discharge wound care:   Complete by: As directed    Okay to shower. Do not apply salves or appointments to incision. No heavy lifting with the upper extremities greater than 15 pounds. May resume driving when not requiring pain medication and patient feels comfortable with doing so.   Incentive spirometry RT   Complete by: As directed    Increase activity slowly   Complete by: As directed      Allergies as of  04/12/2020      Reactions   Penicillins Hives, Itching, Other (See Comments)   Has patient had a PCN reaction causing immediate rash, facial/tongue/throat swelling, SOB or lightheadedness with hypotension: No Has patient had a PCN reaction causing SEVERE RASH INVOLVING MUCUS MEMBRANES or SKIN NECROSIS: #  #  #  YES  #  #  #  Has patient had a PCN reaction that required hospitalization No Has patient had a PCN reaction occurring within the last 10 years: No      Medication List    STOP taking these medications   naproxen sodium 220 MG tablet Commonly known as: ALEVE     TAKE these medications   acetaminophen 325 MG tablet Commonly known as: TYLENOL Take 650 mg by mouth every 6 (six) hours as needed for moderate pain.   cetirizine 10 MG tablet Commonly known as: ZYRTEC Take 10 mg by mouth daily as needed for allergies.   cycloSPORINE 0.05 % ophthalmic emulsion Commonly known as: RESTASIS Place 1 drop into both eyes 2 (two) times daily.   gabapentin 300 MG capsule Commonly known as: NEURONTIN Take 300 mg by mouth at bedtime as needed.   methocarbamol 500 MG tablet Commonly known as: ROBAXIN Take 1 tablet (500 mg total) by mouth every 6 (six) hours as needed for muscle spasms. What changed: Another medication with the same name was added. Make sure you understand how and when to take each.   methocarbamol 500 MG tablet Commonly known as: ROBAXIN Take 1 tablet (500 mg total) by mouth  every 6 (six) hours as needed for muscle spasms. What changed: You were already taking a medication with the same name, and this prescription was added. Make sure you understand how and when to take each.   multivitamin tablet Take 1 tablet by mouth daily.   omeprazole 20 MG tablet Commonly known as: PRILOSEC OTC Take 20 mg by mouth at bedtime.   oxyCODONE-acetaminophen 5-325 MG tablet Commonly known as: PERCOCET/ROXICET Take 1-2 tablets by mouth every 3 (three) hours as needed for moderate  pain or severe pain.   phenazopyridine 200 MG tablet Commonly known as: Pyridium Take 1 tablet (200 mg total) by mouth 3 (three) times daily as needed for pain.   rosuvastatin 20 MG tablet Commonly known as: CRESTOR Take 20 mg by mouth at bedtime.   Xarelto 20 MG Tabs tablet Generic drug: rivaroxaban Take 20 mg by mouth daily with breakfast.            Discharge Care Instructions  (From admission, onward)         Start     Ordered   04/12/20 0000  Discharge wound care:       Comments: Okay to shower. Do not apply salves or appointments to incision. No heavy lifting with the upper extremities greater than 15 pounds. May resume driving when not requiring pain medication and patient feels comfortable with doing so.   04/12/20 0825           Signed: Blanchie Dessert Sairah Knobloch 04/12/2020, 8:25 AM

## 2020-04-12 NOTE — Progress Notes (Addendum)
Patient ID: Terry [name], male   DOB: [birth year], 69 y.o.   MRN: [id number] vital signs are stable patient is ambulated well he is somewhat skeptical of going home today secondary to his history of PEs and travel concerns overall he is doing well but would like to stay another day in the hospital plan discharge for tomorrow. We will add some milk of magnesia or mag citrate this patient has not moved bowels in about 2 days.

## 2020-04-12 NOTE — Discharge Instructions (Signed)

## 2020-04-13 NOTE — Progress Notes (Signed)
Occupational Therapy Treatment Patient Details Name: MATTIAS WALMSLEY MRN: [id number] DOB: [birth year] Today's Date: 04/13/2020    History of present illness pt is a 69 y/o male with pmh significant for prostate CA, HLD, hay fever, GERD, chronic back pain, asthma, R achilles tendon repair, B knee arthroscopy,R finger surgery, cardiac cath 2004, ACDF 2016, s/p L45 interbody fusion with decompression and grafting due to recurrent HNP with radiculopathy.   OT comments  Pt. Seen for skilled OT treatment session prior to home.  Pt. Able to demo donning brace with min a.  Bed mobility s. toileting and simulated shower stall transfer S.   Max cues for recall and integration of back precautions throughout session. Pt. Reports previous sx. With knowledge of how to perform ADLs with necessary modifications.  Also states wife available to assist prn.   Eager for d/c home today when able.    Follow Up Recommendations  No OT follow up    Equipment Recommendations  None recommended by OT    Recommendations for Other Services      Precautions / Restrictions Precautions Precautions: Back Precaution Comments: reviewed precautions pt. 0/3 recall, required cues for review of each Required Braces or Orthoses: Spinal Brace Spinal Brace: Lumbar corset;Applied in sitting position Restrictions Weight Bearing Restrictions: No       Mobility Bed Mobility Overal bed mobility: Needs Assistance Bed Mobility: Rolling;Sidelying to Sit Rolling: Modified independent (Device/Increase time) Sidelying to sit: Supervision       General bed mobility comments: No assistance needed for rolling and moving to edge of bed.  Transfers Overall transfer level: Needs assistance Equipment used: None Transfers: Sit to/from [employer] Sit to Stand: Supervision Stand pivot transfers: Supervision            Balance                                           ADL either performed or  assessed with clinical judgement   ADL Overall ADL's : Needs assistance/impaired                 Upper Body Dressing : Minimal assistance;Sitting Upper Body Dressing Details (indicate cue type and reason): min a to don brace   Lower Body Dressing Details (indicate cue type and reason): pt. reports he is able to do figure 4 but also states this am he donned pants while supine/sidelying. Toilet Transfer: Supervision/safety;Ambulation Toilet Transfer Details (indicate cue type and reason): simulated, states he did not have to actually  use it       Tub/Shower Transfer Details (indicate cue type and reason): reviewed shower set up at home. pt. states b.room is handicap accessible and has a shower stall with very small ledge to step over. able to simulate in room with no lob Functional mobility during ADLs: Supervision/safety General ADL Comments: max cues and review required for back precautions. cont. to state that he "already knew all of this" but cont. to benefit from cues for adherence and safety.     Vision       Perception     Praxis      Cognition Arousal/Alertness: Awake/alert Behavior During Therapy: WFL for tasks assessed/performed Overall Cognitive Status: Within Functional Limits for tasks assessed  General Comments: when unable to recall an answer to a question or carryover with precautions would state "well im on some really good drugs right now"        Exercises     Shoulder Instructions       General Comments      Pertinent Vitals/ Pain       Pain Assessment: No/denies pain  Home Living                                          Prior Functioning/Environment              Frequency  Min 2X/week        Progress Toward Goals  OT Goals(current goals can now be found in the care plan section)  Progress towards OT goals: Progressing toward goals     Plan       Co-evaluation                 AM-PAC OT "6 Clicks" Daily Activity     Outcome Measure   Help from another person eating meals?: None Help from another person taking care of personal grooming?: A Little Help from another person toileting, which includes using toliet, bedpan, or urinal?: A Little Help from another person bathing (including washing, rinsing, drying)?: A Little Help from another person to put on and taking off regular upper body clothing?: A Little Help from another person to put on and taking off regular lower body clothing?: A Little 6 Click Score: 19    End of Session    OT Visit Diagnosis: Pain   Activity Tolerance Patient tolerated treatment well   Patient Left with call bell/phone within reach;Other (comment) (seated eob)   Nurse Communication          Time: 2841-3244 OT Time Calculation (min): 11 min  Charges: OT General Charges $OT Visit: 1 Visit OT Treatments $Self Care/Home Management : 8-22 mins  Sonia Baller, COTA/L Acute Rehabilitation [phone]   Janice Coffin 04/13/2020, 10:12 AM

## 2020-04-13 NOTE — Progress Notes (Signed)
Patient is discharged from room 3C03 at this time. Alert and in stable condition. IV site d/c'd and instructions read to patient and spouse with understanding verbalized and all questions answered. Left unit via wheelchair with all belongings at side. ?

## 2020-05-01 DIAGNOSIS — Z6826 Body mass index (BMI) 26.0-26.9, adult: Secondary | ICD-10-CM | POA: Diagnosis not present

## 2020-05-01 DIAGNOSIS — I10 Essential (primary) hypertension: Secondary | ICD-10-CM | POA: Diagnosis not present

## 2020-05-01 DIAGNOSIS — M4316 Spondylolisthesis, lumbar region: Secondary | ICD-10-CM | POA: Diagnosis not present

## 2020-05-21 DIAGNOSIS — R030 Elevated blood-pressure reading, without diagnosis of hypertension: Secondary | ICD-10-CM | POA: Insufficient documentation

## 2020-06-13 ENCOUNTER — Other Ambulatory Visit: Payer: Self-pay | Admitting: Ophthalmology

## 2020-06-13 DIAGNOSIS — H5347 Heteronymous bilateral field defects: Secondary | ICD-10-CM

## 2020-06-15 ENCOUNTER — Other Ambulatory Visit: Payer: Self-pay

## 2020-06-15 ENCOUNTER — Ambulatory Visit
Admission: RE | Admit: 2020-06-15 | Discharge: 2020-06-15 | Disposition: A | Discharge status: Home / Self Care | Payer: Medicare Other | Source: Ambulatory Visit | Attending: Ophthalmology | Admitting: Ophthalmology

## 2020-06-15 DIAGNOSIS — H5347 Heteronymous bilateral field defects: Secondary | ICD-10-CM | POA: Diagnosis not present

## 2020-06-15 MED ORDER — GADOBENATE DIMEGLUMINE 529 MG/ML IV SOLN
15.0000 mL | Freq: Once | INTRAVENOUS | Status: AC | PRN
  Administered 2020-06-15: 15 mL via INTRAVENOUS

## 2020-06-21 ENCOUNTER — Other Ambulatory Visit: Payer: Self-pay

## 2020-06-21 ENCOUNTER — Encounter: Payer: Self-pay | Admitting: Internal Medicine

## 2020-06-21 ENCOUNTER — Ambulatory Visit (INDEPENDENT_AMBULATORY_CARE_PROVIDER_SITE_OTHER): Payer: Medicare Other | Admitting: Internal Medicine

## 2020-06-21 ENCOUNTER — Ambulatory Visit: Payer: Medicare Other | Admitting: Internal Medicine

## 2020-06-21 VITALS — BP 130/80 | HR 76 | Ht 68.0 in | Wt 168.0 lb

## 2020-06-21 DIAGNOSIS — R195 Other fecal abnormalities: Secondary | ICD-10-CM

## 2020-06-21 DIAGNOSIS — K219 Gastro-esophageal reflux disease without esophagitis: Secondary | ICD-10-CM

## 2020-06-21 DIAGNOSIS — Z86010 Personal history of colonic polyps: Secondary | ICD-10-CM

## 2020-06-21 MED ORDER — SUTAB 1479-225-188 MG PO TABS: 1.0000 | ORAL_TABLET | Freq: Once | ORAL | 0 refills | Status: AC

## 2020-06-21 NOTE — Patient Instructions (Signed)
If you are age 72 or older, your body mass index should be between 23-30. Your Body mass index is 25.54 kg/m. If this is out of the aforementioned range listed, please consider follow up with your Primary Care Provider.  If you are age 31 or younger, your body mass index should be between 19-25. Your Body mass index is 25.54 kg/m. If this is out of the aformentioned range listed, please consider follow up with your Primary Care Provider.   You have been scheduled for an endoscopy and colonoscopy. Please follow the written instructions given to you at your visit today. Please pick up your prep supplies at the pharmacy within the next 1-3 days. If you use inhalers (even only as needed), please bring them with you on the day of your procedure.

## 2020-06-21 NOTE — Progress Notes (Signed)
HISTORY OF PRESENT ILLNESS:  Terry [name] is a 70 y.o. male, retired [employer], who is sent today by his primary care provider, Dr. Virgina Jock, regarding Hemoccult positive stool.  Patient has multiple medical problems as listed below.  He has been seen in this office previously for history of adenomatous colon polyps surveillance colonoscopy.  Colonoscopy examinations 2006, 2011, and most recently November 2016.  The initial examinations revealed small tubular adenomas.  Most recent examination revealed moderate sigmoid diverticulosis but was otherwise normal.  Routine follow-up in 10 years recommended.  As part of his annual evaluation patient underwent Hemoccult testing.  I have reviewed outside records.  The specimen date was March 22, 2020.  The result positive x2.  Review of blood work from September 2021 shows a normal CBC with hemoglobin 14.9.  Patient does have chronic longstanding GERD for which he requires daily PPI use to control symptoms.  He has been taking omeprazole 20 mg daily.  He has not had prior upper endoscopy.  He denies dysphagia.  No abdominal pain.  No melena.  No hematochezia.  No change in bowel habits.  He uses Tylenol for pain, but not NSAIDs.  He has completed his COVID vaccination series and booster  REVIEW OF SYSTEMS:  All non-GI ROS negative unless otherwise stated in the HPI except for anxiety, arthritis, back pain, visual change, muscle cramps, sleeping problems, ankle edema  Past Medical History:  Diagnosis Date   Arthritis    neck, back, knees, right ankle   Bladder calculus    Chronic back pain    Complication of anesthesia    dizziness and "crazy amount of bile"   GERD (gastroesophageal reflux disease)    History of colon polyps    benign   History of kidney stones    and bladder stone   History of prostate cancer urologist-- dr Alinda Money   dx 2014---  s/p  prostatectomy 05-22-2013 ,  Gleason 3+3   Hypercholesterolemia    takes Crestor  daily   Hyperlipidemia    Pneumonia 10/2019   Pulmonary emboli ([place]) 10/2019   Wears glasses     Past Surgical History:  Procedure Laterality Date   ACHILLES TENDON SURGERY Right 06-07-2018   @[facility]    reconstruction   ANTERIOR CERVICAL DECOMP/DISCECTOMY FUSION N/A 06/27/2014   Procedure: ANTERIOR CERVICAL DECOMPRESSION/DISCECTOMY FUSION C5-C7   (2 LEVELS);  Surgeon: Melina Schools, MD;  Location: [facility];  Service: Orthopedics;  Laterality: N/A;   CARDIAC CATHETERIZATION  2004   COLONOSCOPY     CYSTOSCOPY WITH LITHOLAPAXY N/A 03/27/2019   Procedure: CYSTOSCOPY WITH REMOVAL OF BLADDER STONE AND FOREIGN BODY;  Surgeon: Raynelle Bring, MD;  Location[facility];  Service: Urology;  Laterality: N/A;   EYE SURGERY     lazy eye as child, lasik surgery   FINGER SURGERY Right    4th finger   I & D EXTREMITY Right 07/10/2018   Procedure: IRRIGATION AND DEBRIDEMENT RIGHT ANKLE WOUND AND WOUND VAC PLACEMENT;  Surgeon: Wylene Simmer, MD;  Location: [facility];  Service: Orthopedics;  Laterality: Right;   KNEE ARTHROSCOPY Bilateral right ?;  left 05-25-2007  @[facility]    POSTERIOR LAMINECTOMY / DECOMPRESSION LUMBAR SPINE  08-31-2016    dr elsner  @[facility]    PROSTATE BIOPSY  04/13/13   gleason 3+3=6, vol 55.4 cc   ROBOT ASSISTED LAPAROSCOPIC RADICAL PROSTATECTOMY N/A 05/22/2013   Procedure: ROBOTIC ASSISTED LAPAROSCOPIC RADICAL PROSTATECTOMY LEVEL 1;  Surgeon: Dutch Gray, MD;  Location: [facility];  Service: Urology;  Laterality: N/A;   TONSILLECTOMY      Social History Terry [name]  reports that he has never smoked. He has never used smokeless tobacco. He reports current alcohol use. He reports that he does not use drugs.  family history includes Dementia in his mother; Heart attack in his father; Heart disease in his father; Pneumonia in his mother.  Allergies  Allergen Reactions   Penicillins Hives, Itching and Other (See Comments)    Has patient had a PCN reaction causing  immediate rash, facial/tongue/throat swelling, SOB or lightheadedness with hypotension: No Has patient had a PCN reaction causing SEVERE RASH INVOLVING MUCUS MEMBRANES or SKIN NECROSIS: #  #  #  YES  #  #  #  Has patient had a PCN reaction that required hospitalization No Has patient had a PCN reaction occurring within the last 10 years: No    Lipitor [Atorvastatin] Other (See Comments)    Muscle issues       PHYSICAL EXAMINATION: Vital signs: BP 130/80    Pulse 76    Ht 5\' 8"  (1.727 m)    Wt 168 lb (76.2 kg)    BMI 25.54 kg/m   Constitutional: generally well-appearing, no acute distress Psychiatric: alert and oriented x3, cooperative Eyes: extraocular movements intact, anicteric, conjunctiva pink Mouth: oral pharynx moist, no lesions Neck: supple no lymphadenopathy Cardiovascular: heart regular rate and rhythm, no murmur Lungs: clear to auscultation bilaterally Abdomen: soft, nontender, nondistended, no obvious ascites, no peritoneal signs, normal bowel sounds, no organomegaly Rectal: Deferred until colonoscopy Extremities: no clubbing, cyanosis, or lower extremity edema bilaterally Skin: no lesions on visible extremities Neuro: No focal deficits.  Cranial nerves intact  ASSESSMENT:  1.  Hemoccult positive stool.  Asymptomatic.  Normal hemoglobin 2.  Chronic GERD.  Requires PPI to control symptoms. 3.  History of adenomatous colon polyps.  Previous surveillance colonoscopy examinations as outlined above.  Most recent examination 2016 4.  General medical problems.  Stable   PLAN:  1.  Reflux precautions 2.  Continue PPI 3.  Schedule upper endoscopy patient with chronic GERD requiring PPI to screen for Barrett's esophagus and evaluate Hemoccult-positive stool.The nature of the procedure, as well as the risks, benefits, and alternatives were carefully and thoroughly reviewed with the patient. Ample time for discussion and questions allowed. The patient understood, was satisfied,  and agreed to proceed. 4.  Schedule colonoscopy to evaluate Hemoccult-positive stool in a patient with a history of adenomatous colon polyps.The nature of the procedure, as well as the risks, benefits, and alternatives were carefully and thoroughly reviewed with the patient. Ample time for discussion and questions allowed. The patient understood, was satisfied, and agreed to proceed.

## 2020-06-24 DIAGNOSIS — M5116 Intervertebral disc disorders with radiculopathy, lumbar region: Secondary | ICD-10-CM | POA: Diagnosis not present

## 2020-06-24 DIAGNOSIS — M5416 Radiculopathy, lumbar region: Secondary | ICD-10-CM | POA: Diagnosis not present

## 2020-06-27 ENCOUNTER — Encounter: Payer: Medicare Other | Admitting: Internal Medicine

## 2020-07-03 ENCOUNTER — Other Ambulatory Visit: Payer: Medicare Other

## 2020-07-30 DIAGNOSIS — H25812 Combined forms of age-related cataract, left eye: Secondary | ICD-10-CM | POA: Diagnosis not present

## 2020-07-30 DIAGNOSIS — H2512 Age-related nuclear cataract, left eye: Secondary | ICD-10-CM | POA: Diagnosis not present

## 2020-08-06 ENCOUNTER — Encounter: Payer: Medicare Other | Admitting: Internal Medicine

## 2020-08-12 ENCOUNTER — Encounter (HOSPITAL_BASED_OUTPATIENT_CLINIC_OR_DEPARTMENT_OTHER): Payer: Self-pay

## 2020-08-12 ENCOUNTER — Inpatient Hospital Stay (HOSPITAL_BASED_OUTPATIENT_CLINIC_OR_DEPARTMENT_OTHER)
Admission: EM | Admit: 2020-08-12 | Discharge: 2020-08-14 | DRG: 176 | Disposition: A | Discharge status: Home / Self Care | Payer: Medicare Other | Attending: Internal Medicine | Admitting: Internal Medicine

## 2020-08-12 ENCOUNTER — Emergency Department (HOSPITAL_BASED_OUTPATIENT_CLINIC_OR_DEPARTMENT_OTHER): Payer: Medicare Other

## 2020-08-12 ENCOUNTER — Other Ambulatory Visit: Payer: Self-pay

## 2020-08-12 DIAGNOSIS — Z8249 Family history of ischemic heart disease and other diseases of the circulatory system: Secondary | ICD-10-CM | POA: Diagnosis not present

## 2020-08-12 DIAGNOSIS — Z9079 Acquired absence of other genital organ(s): Secondary | ICD-10-CM

## 2020-08-12 DIAGNOSIS — R079 Chest pain, unspecified: Secondary | ICD-10-CM | POA: Diagnosis not present

## 2020-08-12 DIAGNOSIS — Z820 Family history of epilepsy and other diseases of the nervous system: Secondary | ICD-10-CM | POA: Diagnosis not present

## 2020-08-12 DIAGNOSIS — E785 Hyperlipidemia, unspecified: Secondary | ICD-10-CM | POA: Diagnosis not present

## 2020-08-12 DIAGNOSIS — K760 Fatty (change of) liver, not elsewhere classified: Secondary | ICD-10-CM | POA: Diagnosis not present

## 2020-08-12 DIAGNOSIS — Z20822 Contact with and (suspected) exposure to covid-19: Secondary | ICD-10-CM | POA: Diagnosis not present

## 2020-08-12 DIAGNOSIS — R0989 Other specified symptoms and signs involving the circulatory and respiratory systems: Secondary | ICD-10-CM | POA: Diagnosis not present

## 2020-08-12 DIAGNOSIS — Z880 Allergy status to penicillin: Secondary | ICD-10-CM | POA: Diagnosis not present

## 2020-08-12 DIAGNOSIS — Z981 Arthrodesis status: Secondary | ICD-10-CM | POA: Diagnosis not present

## 2020-08-12 DIAGNOSIS — Z8546 Personal history of malignant neoplasm of prostate: Secondary | ICD-10-CM | POA: Diagnosis not present

## 2020-08-12 DIAGNOSIS — G8929 Other chronic pain: Secondary | ICD-10-CM | POA: Diagnosis not present

## 2020-08-12 DIAGNOSIS — R109 Unspecified abdominal pain: Secondary | ICD-10-CM | POA: Diagnosis not present

## 2020-08-12 DIAGNOSIS — Z888 Allergy status to other drugs, medicaments and biological substances status: Secondary | ICD-10-CM

## 2020-08-12 DIAGNOSIS — K219 Gastro-esophageal reflux disease without esophagitis: Secondary | ICD-10-CM | POA: Diagnosis present

## 2020-08-12 DIAGNOSIS — I2609 Other pulmonary embolism with acute cor pulmonale: Secondary | ICD-10-CM

## 2020-08-12 DIAGNOSIS — J984 Other disorders of lung: Secondary | ICD-10-CM | POA: Diagnosis not present

## 2020-08-12 DIAGNOSIS — Z86711 Personal history of pulmonary embolism: Secondary | ICD-10-CM | POA: Diagnosis not present

## 2020-08-12 DIAGNOSIS — M549 Dorsalgia, unspecified: Secondary | ICD-10-CM | POA: Diagnosis present

## 2020-08-12 DIAGNOSIS — E7800 Pure hypercholesterolemia, unspecified: Secondary | ICD-10-CM | POA: Diagnosis present

## 2020-08-12 DIAGNOSIS — I2699 Other pulmonary embolism without acute cor pulmonale: Secondary | ICD-10-CM | POA: Diagnosis not present

## 2020-08-12 DIAGNOSIS — R1011 Right upper quadrant pain: Secondary | ICD-10-CM | POA: Diagnosis not present

## 2020-08-12 DIAGNOSIS — R918 Other nonspecific abnormal finding of lung field: Secondary | ICD-10-CM | POA: Diagnosis not present

## 2020-08-12 DIAGNOSIS — R000 Tachycardia, unspecified: Secondary | ICD-10-CM | POA: Diagnosis not present

## 2020-08-12 LAB — PROTIME-INR
INR: 1 (ref 0.8–1.2)
Prothrombin Time: 12.8 seconds (ref 11.4–15.2)

## 2020-08-12 LAB — HEPARIN LEVEL (UNFRACTIONATED)
Heparin Unfractionated: <0.1 IU/mL — ABNORMAL LOW (ref 0.30–0.70)
Heparin Unfractionated: 0.28 IU/mL — ABNORMAL LOW (ref 0.30–0.70)

## 2020-08-12 LAB — CBC WITH DIFFERENTIAL/PLATELET
Abs Immature Granulocytes: 0.05 10*3/uL (ref 0.00–0.07)
Basophils Absolute: 0 10*3/uL (ref 0.0–0.1)
Basophils Relative: 0 %
Eosinophils Absolute: 0.1 10*3/uL (ref 0.0–0.5)
Eosinophils Relative: 1 %
HCT: 44.2 % (ref 39.0–52.0)
Hemoglobin: 15.4 g/dL (ref 13.0–17.0)
Immature Granulocytes: 0 %
Lymphocytes Relative: 18 %
Lymphs Abs: 2.1 10*3/uL (ref 0.7–4.0)
MCH: 28.9 pg (ref 26.0–34.0)
MCHC: 34.8 g/dL (ref 30.0–36.0)
MCV: 83.1 fL (ref 80.0–100.0)
Monocytes Absolute: 1.2 10*3/uL — ABNORMAL HIGH (ref 0.1–1.0)
Monocytes Relative: 10 %
Neutro Abs: 8.4 10*3/uL — ABNORMAL HIGH (ref 1.7–7.7)
Neutrophils Relative %: 71 %
Platelets: 230 10*3/uL (ref 150–400)
RBC: 5.32 MIL/uL (ref 4.22–5.81)
RDW: 14.7 % (ref 11.5–15.5)
WBC: 11.9 10*3/uL — ABNORMAL HIGH (ref 4.0–10.5)
nRBC: 0 % (ref 0.0–0.2)

## 2020-08-12 LAB — RESP PANEL BY RT-PCR (FLU A&B, COVID) ARPGX2
Influenza A by PCR: NEGATIVE
Influenza B by PCR: NEGATIVE
SARS Coronavirus 2 by RT PCR: NEGATIVE

## 2020-08-12 LAB — LIPASE, BLOOD: Lipase: 30 U/L (ref 11–51)

## 2020-08-12 LAB — COMPREHENSIVE METABOLIC PANEL
ALT: 45 U/L — ABNORMAL HIGH (ref 0–44)
AST: 25 U/L (ref 15–41)
Albumin: 3.6 g/dL (ref 3.5–5.0)
Alkaline Phosphatase: 58 U/L (ref 38–126)
Anion gap: 13 (ref 5–15)
BUN: 8 mg/dL (ref 8–23)
CO2: 22 mmol/L (ref 22–32)
Calcium: 8.8 mg/dL — ABNORMAL LOW (ref 8.9–10.3)
Chloride: 100 mmol/L (ref 98–111)
Creatinine, Ser: 0.72 mg/dL (ref 0.61–1.24)
GFR, Estimated: >60 mL/min (ref >60)
Glucose, Bld: 128 mg/dL — ABNORMAL HIGH (ref 70–99)
Potassium: 3.5 mmol/L (ref 3.5–5.1)
Sodium: 135 mmol/L (ref 135–145)
Total Bilirubin: 1 mg/dL (ref 0.3–1.2)
Total Protein: 6.7 g/dL (ref 6.5–8.1)

## 2020-08-12 LAB — TROPONIN I (HIGH SENSITIVITY)
Troponin I (High Sensitivity): 3 ng/L (ref <18)
Troponin I (High Sensitivity): 3 ng/L (ref <18)

## 2020-08-12 LAB — APTT: aPTT: 31 seconds (ref 24–36)

## 2020-08-12 MED ORDER — HEPARIN (PORCINE) 25000 UT/250ML-% IV SOLN
1750.0000 [IU]/h | INTRAVENOUS | Status: DC
  Administered 2020-08-12: 1300 [IU]/h via INTRAVENOUS
  Administered 2020-08-13: 1450 [IU]/h via INTRAVENOUS
  Administered 2020-08-14: 1600 [IU]/h via INTRAVENOUS
  Filled 2020-08-12 (×4): qty 250

## 2020-08-12 MED ORDER — ACETAMINOPHEN 650 MG RE SUPP: 650.0000 mg | Freq: Four times a day (QID) | RECTAL | Status: DC | PRN

## 2020-08-12 MED ORDER — HEPARIN BOLUS VIA INFUSION
1100.0000 [IU] | Freq: Once | INTRAVENOUS | Status: AC
  Administered 2020-08-12: 1100 [IU] via INTRAVENOUS
  Filled 2020-08-12: qty 1100

## 2020-08-12 MED ORDER — ACETAMINOPHEN 325 MG PO TABS
650.0000 mg | ORAL_TABLET | Freq: Four times a day (QID) | ORAL | Status: DC | PRN
  Administered 2020-08-13: 650 mg via ORAL
  Filled 2020-08-12: qty 2

## 2020-08-12 MED ORDER — IOHEXOL 350 MG/ML SOLN
100.0000 mL | Freq: Once | INTRAVENOUS | Status: AC | PRN
  Administered 2020-08-12: 100 mL via INTRAVENOUS

## 2020-08-12 MED ORDER — ZOLPIDEM TARTRATE 5 MG PO TABS
5.0000 mg | ORAL_TABLET | Freq: Once | ORAL | Status: AC
  Administered 2020-08-12: 5 mg via ORAL
  Filled 2020-08-12: qty 1

## 2020-08-12 MED ORDER — HEPARIN BOLUS VIA INFUSION
5500.0000 [IU] | Freq: Once | INTRAVENOUS | Status: AC
  Administered 2020-08-12: 5500 [IU] via INTRAVENOUS

## 2020-08-12 MED ORDER — TRAMADOL HCL 50 MG PO TABS
50.0000 mg | ORAL_TABLET | Freq: Four times a day (QID) | ORAL | Status: DC | PRN
  Administered 2020-08-13: 50 mg via ORAL
  Filled 2020-08-12: qty 1

## 2020-08-12 MED ORDER — LORAZEPAM 1 MG PO TABS
1.0000 mg | ORAL_TABLET | Freq: Once | ORAL | Status: AC
  Administered 2020-08-12: 1 mg via ORAL
  Filled 2020-08-12: qty 1

## 2020-08-12 NOTE — ED Notes (Signed)
[facility] called for transport 

## 2020-08-12 NOTE — Progress Notes (Signed)
ANTICOAGULATION CONSULT NOTE - Initial Consult  Pharmacy Consult for heparin Indication: pulmonary embolus  Allergies  Allergen Reactions  . Penicillins Hives, Itching and Other (See Comments)    Has patient had a PCN reaction causing immediate rash, facial/tongue/throat swelling, SOB or lightheadedness with hypotension: No Has patient had a PCN reaction causing SEVERE RASH INVOLVING MUCUS MEMBRANES or SKIN NECROSIS: #  #  #  YES  #  #  #  Has patient had a PCN reaction that required hospitalization No Has patient had a PCN reaction occurring within the last 10 years: No   . Lipitor [Atorvastatin] Other (See Comments)    Muscle issues    Patient Measurements: Height: 5\' 7"  (170.2 cm) Weight: 73.9 kg (163 lb) IBW/kg (Calculated) : 66.1 Heparin Dosing Weight: 74 kg  Vital Signs: Temp: 99.3 F (37.4 C) (03/14 1024) Temp Source: Oral (03/14 1024) BP: 123/81 (03/14 1230) Pulse Rate: 93 (03/14 1230)  Labs: Recent Labs    08/12/20 1107  HGB 15.4  HCT 44.2  PLT 230  CREATININE 0.72  TROPONINIHS 3    Estimated Creatinine Clearance: 79.2 mL/min (by C-G formula based on SCr of 0.72 mg/dL).   Medical History: Past Medical History:  Diagnosis Date  . Arthritis    neck, back, knees, right ankle  . Bladder calculus   . Chronic back pain   . Complication of anesthesia    dizziness and "crazy amount of bile"  . GERD (gastroesophageal reflux disease)   . History of colon polyps    benign  . History of kidney stones    and bladder stone  . History of prostate cancer urologist-- dr Alinda Money   dx 2014---  s/p  prostatectomy 05-22-2013 ,  Gleason 3+3  . Hypercholesterolemia    takes Crestor daily  . Hyperlipidemia   . Pneumonia 10/2019  . Pulmonary emboli ([place]) 10/2019  . Wears glasses     Medications:  Scheduled:  . heparin  5,500 Units Intravenous Once    Assessment:  Patient with current PE, (PMH of PE in Sept 2021, txn with Xarelto, last dose Dec 2021)  Goal  of Therapy:  Heparin level 0.3-0.7 units/ml Monitor platelets by anticoagulation protocol: Yes   Plan:  Give 5500 units units bolus x 1 Start heparin infusion at 1300 units/hr Check anti-Xa level in 8 hours and daily while on heparin Continue to monitor H&H and platelets  Mallie Mussel A Zompa 08/12/2020,1:42 PM

## 2020-08-12 NOTE — Progress Notes (Signed)
ANTICOAGULATION CONSULT NOTE - [place] for IV Heparin Indication: pulmonary embolus  Allergies  Allergen Reactions  . Penicillins Hives, Itching and Other (See Comments)    Has patient had a PCN reaction causing immediate rash, facial/tongue/throat swelling, SOB or lightheadedness with hypotension: No Has patient had a PCN reaction causing SEVERE RASH INVOLVING MUCUS MEMBRANES or SKIN NECROSIS: #  #  #  YES  #  #  #  Has patient had a PCN reaction that required hospitalization No Has patient had a PCN reaction occurring within the last 10 years: No   . Lipitor [Atorvastatin] Other (See Comments)    Muscle issues    Patient Measurements: Height: 5\' 7"  (170.2 cm) Weight: 73.9 kg (163 lb) IBW/kg (Calculated) : 66.1 Heparin Dosing Weight: 74 kg  Vital Signs: Temp: 98.7 F (37.1 C) (03/14 2056) Temp Source: Oral (03/14 2056) BP: 118/81 (03/14 2056) Pulse Rate: 93 (03/14 2056)  Labs: Recent Labs    08/12/20 1107 08/12/20 1328 08/12/20 1401 08/12/20 2125  HGB 15.4  --   --   --   HCT 44.2  --   --   --   PLT 230  --   --   --   APTT 31  --   --   --   LABPROT 12.8  --   --   --   INR 1.0  --   --   --   HEPARINUNFRC  --   --  <0.10* 0.28*  CREATININE 0.72  --   --   --   TROPONINIHS 3 3  --   --     Estimated Creatinine Clearance: 79.2 mL/min (by C-G formula based on SCr of 0.72 mg/dL).   Medical History: Past Medical History:  Diagnosis Date  . Arthritis    neck, back, knees, right ankle  . Bladder calculus   . Chronic back pain   . Complication of anesthesia    dizziness and "crazy amount of bile"  . GERD (gastroesophageal reflux disease)   . History of colon polyps    benign  . History of kidney stones    and bladder stone  . History of prostate cancer urologist-- dr Alinda Money   dx 2014---  s/p  prostatectomy 05-22-2013 ,  Gleason 3+3  . Hypercholesterolemia    takes Crestor daily  . Hyperlipidemia   . Pneumonia 10/2019  .  Pulmonary emboli ([place]) 10/2019  . Wears glasses     Assessment: 70 yr old male admitted with new PE with RHS; also has hx of PE in 8/21 for which he rec'd treatment with Xarelto, last dose 05/2020. Pharmacy was consulted to dose IV heparin; pt was not on anticoagulant PTA.  Initial heparin level ~7.5 hrs after heparin 5500 units IV bolus X 1, followed by heparin infusion at 1300 units/hr, was 0.28 units/ml, which is below the goal range for this pt. H/H, plt WNL/stable. Per RN, no issues with IV or bleeding observed.  Goal of Therapy:  Heparin level 0.3-0.7 units/ml Monitor platelets by anticoagulation protocol: Yes   Plan:  Heparin 1100 units IV bolus X 1 Increase heparin infusion to 1450 units/hr Check heparin level in ~7 hrs Monitor daily heparin level, CBC Monitor for bleeding F/U transition to oral anticoagulant when able  Gillermina Hu, PharmD, BCPS, [facility] Clinical Pharmacist 08/12/2020,10:04 PM

## 2020-08-12 NOTE — H&P (Signed)
History and Physical    Terry [name] WUJ:[id number] DOB: 1950/09/11 DOA: 08/12/2020  PCP: Shon Baton, MD  Patient coming from: Home.  Chief Complaint: Right upper quadrant and right-sided chest pain.  HPI: Terry [name] is a 70 y.o. male with history of pulmonary embolism last year and around August presently off anticoagulation presents to the ER admits in [facility] with complaints of chest pain and right upper quadrant pain for the last 3 days.  Pain increased on deep inspiration.  No nausea vomiting diarrhea fever chills or productive cough.  Denies any recent travel or noticing any leg swelling.  ED Course: In the ER patient was hemodynamically stable.  Ultrasound of the abdomen was unremarkable for anything acute.  CT angiogram of the chest shows pulmonary embolism with features concerning for right heart strain.  ER physician discussed with on-call pulmonologist Dr. Lamonte Sakai who advised heparin infusion and admission.  Covid test is negative.  High sensitive troponins were negative.  EKG shows normal sinus rhythm.  Review of Systems: As per HPI, rest all negative.   Past Medical History:  Diagnosis Date  . Arthritis    neck, back, knees, right ankle  . Bladder calculus   . Chronic back pain   . Complication of anesthesia    dizziness and "crazy amount of bile"  . GERD (gastroesophageal reflux disease)   . History of colon polyps    benign  . History of kidney stones    and bladder stone  . History of prostate cancer urologist-- dr Alinda Money   dx 2014---  s/p  prostatectomy 05-22-2013 ,  Gleason 3+3  . Hypercholesterolemia    takes Crestor daily  . Hyperlipidemia   . Pneumonia 10/2019  . Pulmonary emboli ([place]) 10/2019  . Wears glasses     Past Surgical History:  Procedure Laterality Date  . ACHILLES TENDON SURGERY Right 06-07-2018   @[facility]    reconstruction  . ANTERIOR CERVICAL DECOMP/DISCECTOMY FUSION N/A 06/27/2014   Procedure: ANTERIOR CERVICAL  DECOMPRESSION/DISCECTOMY FUSION C5-C7   (2 LEVELS);  Surgeon: Melina Schools, MD;  Location: [facility];  Service: Orthopedics;  Laterality: N/A;  . CARDIAC CATHETERIZATION  2004  . COLONOSCOPY    . CYSTOSCOPY WITH LITHOLAPAXY N/A 03/27/2019   Procedure: CYSTOSCOPY WITH REMOVAL OF BLADDER STONE AND FOREIGN BODY;  Surgeon: Raynelle Bring, MD;  Location: [facility];  Service: Urology;  Laterality: N/A;  . EYE SURGERY     lazy eye as child, lasik surgery  . FINGER SURGERY Right    4th finger  . I & D EXTREMITY Right 07/10/2018   Procedure: IRRIGATION AND DEBRIDEMENT RIGHT ANKLE WOUND AND WOUND VAC PLACEMENT;  Surgeon: Wylene Simmer, MD;  Location: [facility];  Service: Orthopedics;  Laterality: Right;  . KNEE ARTHROSCOPY Bilateral right ?;  left 05-25-2007  @[facility]   . POSTERIOR LAMINECTOMY / DECOMPRESSION LUMBAR SPINE  08-31-2016    dr elsner  @[facility]   . PROSTATE BIOPSY  04/13/13   gleason 3+3=6, vol 55.4 cc  . ROBOT ASSISTED LAPAROSCOPIC RADICAL PROSTATECTOMY N/A 05/22/2013   Procedure: ROBOTIC ASSISTED LAPAROSCOPIC RADICAL PROSTATECTOMY LEVEL 1;  Surgeon: Dutch Gray, MD;  Location: [facility];  Service: Urology;  Laterality: N/A;  . TONSILLECTOMY       reports that he has never smoked. He has never used smokeless tobacco. He reports current alcohol use. He reports that he does not use drugs.  Allergies  Allergen Reactions  . Penicillins Hives, Itching and Other (See Comments)  Has patient had a PCN reaction causing immediate rash, facial/tongue/throat swelling, SOB or lightheadedness with hypotension: No Has patient had a PCN reaction causing SEVERE RASH INVOLVING MUCUS MEMBRANES or SKIN NECROSIS: #  #  #  YES  #  #  #  Has patient had a PCN reaction that required hospitalization No Has patient had a PCN reaction occurring within the last 10 years: No   . Lipitor [Atorvastatin] Other (See Comments)    Muscle issues    Family History  Problem Relation Age of Onset  . Pneumonia Mother         bacterial  . Dementia Mother   . Heart disease Father   . Heart attack Father   . Colon cancer Neg Hx     Prior to Admission medications   Medication Sig Start Date End Date Taking? Authorizing Provider  acetaminophen (TYLENOL) 500 MG tablet Take 500 mg by mouth every 6 (six) hours as needed for mild pain.   Yes [provider]  cetirizine (ZYRTEC) 10 MG tablet Take 10 mg by mouth daily as needed for allergies.   Yes [provider]  Difluprednate 0.05 % EMUL Place 1 drop into the left eye in the morning and at bedtime.   Yes [provider]  methocarbamol (ROBAXIN) 500 MG tablet Take 1 tablet (500 mg total) by mouth every 6 (six) hours as needed for muscle spasms. 04/12/20  Yes Kristeen Miss, MD  Multiple Vitamin (MULTIVITAMIN) tablet Take 1 tablet by mouth daily.   Yes [provider]  omeprazole (PRILOSEC OTC) 20 MG tablet Take 20 mg by mouth at bedtime.    Yes [provider]  gabapentin (NEURONTIN) 300 MG capsule Take 300 mg by mouth at bedtime as needed. Patient not taking: No sig reported    [provider]  oxyCODONE-acetaminophen (PERCOCET/ROXICET) 5-325 MG tablet Take 1-2 tablets by mouth every 3 (three) hours as needed for moderate pain or severe pain. 04/12/20   Kristeen Miss, MD    Physical Exam: Constitutional: Moderately built and nourished. Vitals:   08/12/20 1700 08/12/20 1730 08/12/20 1844 08/12/20 2056  BP: 123/77 114/83 131/78 118/81  Pulse: 78 86 96 93  Resp: 17 (!) 21 18 20   Temp:   98.6 F (37 C) 98.7 F (37.1 C)  TempSrc:    Oral  SpO2: 98% 98% 97% 96%  Weight:      Height:       Eyes: Anicteric no pallor. ENMT: No discharge from the ears eyes nose or mouth. Neck: No mass felt.  No neck rigidity. Respiratory: No rhonchi or crepitations. Cardiovascular: S1-S2 heard. Abdomen: Mild tenderness in the right upper quadrant. Musculoskeletal: No edema. Skin: No rash. Neurologic: Alert awake oriented to  time place and person.  Moves all extremities. Psychiatric: Appears normal.  Normal affect.   Labs on Admission: I have personally reviewed following labs and imaging studies  CBC: Recent Labs  Lab 08/12/20 1107  WBC 11.9*  NEUTROABS 8.4*  HGB 15.4  HCT 44.2  MCV 83.1  PLT 676   Basic Metabolic Panel: Recent Labs  Lab 08/12/20 1107  NA 135  K 3.5  CL 100  CO2 22  GLUCOSE 128*  BUN 8  CREATININE 0.72  CALCIUM 8.8*   GFR: Estimated Creatinine Clearance: 79.2 mL/min (by C-G formula based on SCr of 0.72 mg/dL). Liver Function Tests: Recent Labs  Lab 08/12/20 1107  AST 25  ALT 45*  ALKPHOS 58  BILITOT 1.0  PROT  6.7  ALBUMIN 3.6   Recent Labs  Lab 08/12/20 1151  LIPASE 30   No results for input(s): AMMONIA in the last 168 hours. Coagulation Profile: Recent Labs  Lab 08/12/20 1107  INR 1.0   Cardiac Enzymes: No results for input(s): CKTOTAL, CKMB, CKMBINDEX, TROPONINI in the last 168 hours. BNP (last 3 results) No results for input(s): PROBNP in the last 8760 hours. HbA1C: No results for input(s): HGBA1C in the last 72 hours. CBG: No results for input(s): GLUCAP in the last 168 hours. Lipid Profile: No results for input(s): CHOL, HDL, LDLCALC, TRIG, CHOLHDL, LDLDIRECT in the last 72 hours. Thyroid Function Tests: No results for input(s): TSH, T4TOTAL, FREET4, T3FREE, THYROIDAB in the last 72 hours. Anemia Panel: No results for input(s): VITAMINB12, FOLATE, FERRITIN, TIBC, IRON, RETICCTPCT in the last 72 hours. Urine analysis: No results found for: COLORURINE, APPEARANCEUR, LABSPEC, PHURINE, GLUCOSEU, HGBUR, BILIRUBINUR, KETONESUR, PROTEINUR, UROBILINOGEN, NITRITE, LEUKOCYTESUR Sepsis Labs: @LABRCNTIP (procalcitonin:4,lacticidven:4) ) Recent Results (from the past 240 hour(s))  Resp Panel by RT-PCR (Flu A&B, Covid) Nasopharyngeal Swab     Status: None   Collection Time: 08/12/20  5:39 PM   Specimen: Nasopharyngeal Swab; Nasopharyngeal(NP) swabs in  vial transport medium  Result Value Ref Range Status   SARS Coronavirus 2 by RT PCR NEGATIVE NEGATIVE Final    Comment: (NOTE) SARS-CoV-2 target nucleic acids are NOT DETECTED.  The SARS-CoV-2 RNA is generally detectable in upper respiratory specimens during the acute phase of infection. The lowest concentration of SARS-CoV-2 viral copies this assay can detect is 138 copies/mL. A negative result does not preclude SARS-Cov-2 infection and should not be used as the sole basis for treatment or other patient management decisions. A negative result may occur with  improper specimen collection/handling, submission of specimen other than nasopharyngeal swab, presence of viral mutation(s) within the areas targeted by this assay, and inadequate number of viral copies(<138 copies/mL). A negative result must be combined with clinical observations, patient history, and epidemiological information. The expected result is Negative.  Fact Sheet for Patients:  EntrepreneurPulse.com.au  Fact Sheet for Healthcare Providers:  IncredibleEmployment.be  This test is no t yet approved or cleared by the [place] FDA and  has been authorized for detection and/or diagnosis of SARS-CoV-2 by FDA under an Emergency Use Authorization (EUA). This EUA will remain  in effect (meaning this test can be used) for the duration of the COVID-19 declaration under Section 564(b)(1) of the Act, 21 U.S.C.section 360bbb-3(b)(1), unless the authorization is terminated  or revoked sooner.       Influenza A by PCR NEGATIVE NEGATIVE Final   Influenza B by PCR NEGATIVE NEGATIVE Final    Comment: (NOTE) The Xpert Xpress SARS-CoV-2/FLU/RSV plus assay is intended as an aid in the diagnosis of influenza from Nasopharyngeal swab specimens and should not be used as a sole basis for treatment. Nasal washings and aspirates are unacceptable for Xpert Xpress SARS-CoV-2/FLU/RSV testing.  Fact  Sheet for Patients: EntrepreneurPulse.com.au  Fact Sheet for Healthcare Providers: IncredibleEmployment.be  This test is not yet approved or cleared by the [place] FDA and has been authorized for detection and/or diagnosis of SARS-CoV-2 by FDA under an Emergency Use Authorization (EUA). This EUA will remain in effect (meaning this test can be used) for the duration of the COVID-19 declaration under Section 564(b)(1) of the Act, 21 U.S.C. section 360bbb-3(b)(1), unless the authorization is terminated or revoked.  Performed at [facility], [address], [place],  [address]  Radiological Exams on Admission: CT Angio Chest PE W and/or Wo Contrast  Result Date: 08/12/2020 CLINICAL DATA:  Chest pain. EXAM: CT ANGIOGRAPHY CHEST WITH CONTRAST TECHNIQUE: Multidetector CT imaging of the chest was performed using the standard protocol during bolus administration of intravenous contrast. Multiplanar CT image reconstructions and MIPs were obtained to evaluate the vascular anatomy. CONTRAST:  162mL OMNIPAQUE IOHEXOL 350 MG/ML SOLN COMPARISON:  January 16, 2020. FINDINGS: Cardiovascular: Large filling defect is seen in lower lobe branch of right pulmonary artery consistent with pulmonary embolus. No pericardial effusion is noted. RV/LV ratio of 1.3 is noted suggesting right heart strain. No evidence of thoracic aortic dissection or aneurysm. Mediastinum/Nodes: No enlarged mediastinal, hilar, or axillary lymph nodes. Thyroid gland, trachea, and esophagus demonstrate no significant findings. Lungs/Pleura: No pneumothorax or pleural effusion is noted. Stable bibasilar atelectasis or infiltrates are noted, right greater than left. Upper Abdomen: Hepatic steatosis is noted. Musculoskeletal: No chest wall abnormality. No acute or significant osseous findings. Review of the MIP images confirms the above findings. IMPRESSION: 1. Large filling defect is  seen in lower lobe branch of right pulmonary artery consistent with pulmonary embolus. RV/LV ratio of 1.3 is noted suggesting right heart strain. Critical Value/emergent results were called by telephone at the time of interpretation on 08/12/2020 at 1:09 pm to provider [facility] , who verbally acknowledged these results. 2. Stable bibasilar atelectasis or infiltrates are noted, right greater than left. 3. Hepatic steatosis. Electronically Signed   By: Marijo Conception M.D.   On: 08/12/2020 13:09   DG Chest Portable 1 View  Result Date: 08/12/2020 CLINICAL DATA:  70 year old male with a history of chest pain EXAM: PORTABLE CHEST 1 VIEW COMPARISON:  05/18/2013, CT chest 01/16/2020 FINDINGS: Cardiomediastinal silhouette unchanged in size and contour. Low lung volumes accentuate the central vascular structures. No pneumothorax. Linear opacities at bilateral lung bases. No evidence of large pleural effusion. Surgical changes of the cervical region. No acute displaced fracture. IMPRESSION: Low lung volumes with likely basilar atelectasis. Electronically Signed   By: Corrie Mckusick D.O.   On: 08/12/2020 12:38   US Abdomen Limited RUQ (LIVER/GB)  Result Date: 08/12/2020 CLINICAL DATA:  Upper abdominal pain EXAM: ULTRASOUND ABDOMEN LIMITED RIGHT UPPER QUADRANT COMPARISON:  Abdominal ultrasound July 18, 2015 FINDINGS: Gallbladder: No gallstones or wall thickening visualized. There is no pericholecystic fluid. No sonographic Murphy sign noted by sonographer. Common bile duct: Diameter: 3 mm. No intrahepatic or extrahepatic biliary duct dilatation Liver: No focal lesion identified. Liver echogenicity is increased diffusely. Portal vein is patent on color Doppler imaging with normal direction of blood flow towards the liver. Other: None. IMPRESSION: Diffuse increase in liver echogenicity, a finding indicative of hepatic steatosis. No focal liver lesions evident. It must be cautioned that the sensitivity of  ultrasound for detection of focal liver lesions is diminished in this circumstance. Study otherwise unremarkable. Electronically Signed   By: Lowella Grip III M.D.   On: 08/12/2020 11:54    EKG: Independently reviewed.  Normal sinus rhythm.  Assessment/Plan Principal Problem:   Pulmonary embolism ([place])    1. Pulmonary embolism with right heart strain presently hemodynamically stable likely unprovoked.  Patient has been started on heparin after discussing with on-call pulmonologist Dr. Lamonte Sakai by the ER physician.  Will check 2D echo Dopplers of the lower extremity and if patient continues to remain hemodynamically stable change to oral anticoagulant. 2. History of hyperlipidemia -will need to confirm home medications.   DVT prophylaxis: Heparin infusion. Code Status:  Full code. Family Communication: Discussed with patient. Disposition Plan: Home. Consults called: ER physician discussed with on-call pulmonologist. Admission status: Observation.   Rise Patience MD [facility] Pager [phone].  If 7PM-7AM, please contact night-coverage [email or web address] Password [facility]  08/12/2020, 10:12 PM

## 2020-08-12 NOTE — Plan of Care (Addendum)
70 y/o M with pmh PE in 12/2019 no longer on anticoagulation presents with right-sided chest pain.  CTA of the chest significant for large filling defects in the lower lobe of the right pulmonary artery consistent for a pulmonary embolus with signs of right heart strain.  Patient was started on heparin drip.  Vital signs currently stable and patient maintaining O2 saturation on room air.  Case had been discussed with Dr. Lamonte Sakai of [facility] echocardiogram recommended.  Transfer to a cardiac telemetry bed as observation.  Patient would likely need to be on lifelong anticoagulation.

## 2020-08-12 NOTE — ED Triage Notes (Signed)
R chest pain x 3 days.  Described as "burning".  DOE with some cough.

## 2020-08-12 NOTE — ED Notes (Signed)
Report given to Mickel Baas, Receiving RN

## 2020-08-12 NOTE — ED Provider Notes (Signed)
[facility] EMERGENCY DEPARTMENT Provider Note   CSN: [id number] Arrival date & time: 08/12/20  1016     History Chief Complaint  Patient presents with  . Chest Pain    MICIAH COVELLI is a 70 y.o. male.  HPI      70yo male with history of PE in August no longer on anticoagulation, hx of prostate cancer, nephrolithiasis, presents with concern for right upper quadrant pain radiating to right chest.  Reports 3 days of symtoms, feels like burning located right lower chest/UQ, worse with deep breaths. Associated dyspnea, cough. Not necessarily worse with eating.  Pain is severe.  No fevers. No leg pain or swelling. Does have cramps sometimes in bilateral legs and hands.  Past Medical History:  Diagnosis Date  . Arthritis    neck, back, knees, right ankle  . Bladder calculus   . Chronic back pain   . Complication of anesthesia    dizziness and "crazy amount of bile"  . GERD (gastroesophageal reflux disease)   . History of colon polyps    benign  . History of kidney stones    and bladder stone  . History of prostate cancer urologist-- dr Alinda Money   dx 2014---  s/p  prostatectomy 05-22-2013 ,  Gleason 3+3  . Hypercholesterolemia    takes Crestor daily  . Hyperlipidemia   . Pneumonia 10/2019  . Pulmonary emboli ([place]) 10/2019  . Wears glasses     Patient Active Problem List   Diagnosis Date Noted  . Pulmonary embolism ([place]) 08/12/2020  . Infection, skin 07/26/2018  . Achilles tendon infection 07/26/2018  . Wound dehiscence, surgical 07/10/2018    Class: Acute  . Wound dehiscence, surgical, initial encounter 07/10/2018  . Spondylolisthesis of lumbar region 08/31/2016  . Neck pain 06/27/2014  . Prostate cancer [facility])     Past Surgical History:  Procedure Laterality Date  . ACHILLES TENDON SURGERY Right 06-07-2018   @[facility]    reconstruction  . ANTERIOR CERVICAL DECOMP/DISCECTOMY FUSION N/A 06/27/2014   Procedure: ANTERIOR CERVICAL DECOMPRESSION/DISCECTOMY FUSION  C5-C7   (2 LEVELS);  Surgeon: Melina Schools, MD;  Location: [facility];  Service: Orthopedics;  Laterality: N/A;  . CARDIAC CATHETERIZATION  2004  . COLONOSCOPY    . CYSTOSCOPY WITH LITHOLAPAXY N/A 03/27/2019   Procedure: CYSTOSCOPY WITH REMOVAL OF BLADDER STONE AND FOREIGN BODY;  Surgeon: Raynelle Bring, MD;  Location: [facility];  Service: Urology;  Laterality: N/A;  . EYE SURGERY     lazy eye as child, lasik surgery  . FINGER SURGERY Right    4th finger  . I & D EXTREMITY Right 07/10/2018   Procedure: IRRIGATION AND DEBRIDEMENT RIGHT ANKLE WOUND AND WOUND VAC PLACEMENT;  Surgeon: Wylene Simmer, MD;  Location: [facility];  Service: Orthopedics;  Laterality: Right;  . KNEE ARTHROSCOPY Bilateral right ?;  left 05-25-2007  @[facility]   . POSTERIOR LAMINECTOMY / DECOMPRESSION LUMBAR SPINE  08-31-2016    dr elsner  @[facility]   . PROSTATE BIOPSY  04/13/13   gleason 3+3=6, vol 55.4 cc  . ROBOT ASSISTED LAPAROSCOPIC RADICAL PROSTATECTOMY N/A 05/22/2013   Procedure: ROBOTIC ASSISTED LAPAROSCOPIC RADICAL PROSTATECTOMY LEVEL 1;  Surgeon: Dutch Gray, MD;  Location: [facility];  Service: Urology;  Laterality: N/A;  . TONSILLECTOMY         Family History  Problem Relation Age of Onset  . Pneumonia Mother        bacterial  . Dementia Mother   . Heart disease Father   .  Heart attack Father   . Colon cancer Neg Hx     Social History   Tobacco Use  . Smoking status: Never Smoker  . Smokeless tobacco: Never Used  Vaping Use  . Vaping Use: Never used  Substance Use Topics  . Alcohol use: Yes    Alcohol/week: 0.0 standard drinks    Comment: occ  . Drug use: Never    Home Medications Prior to Admission medications   Medication Sig Start Date End Date Taking? Authorizing Provider  acetaminophen (TYLENOL) 500 MG tablet Take 500 mg by mouth every 6 (six) hours as needed for mild pain.   Yes [provider]  cetirizine (ZYRTEC) 10 MG tablet Take 10 mg by mouth daily as needed for allergies.    Yes [provider]  Difluprednate 0.05 % EMUL Place 1 drop into the left eye in the morning and at bedtime.   Yes [provider]  methocarbamol (ROBAXIN) 500 MG tablet Take 1 tablet (500 mg total) by mouth every 6 (six) hours as needed for muscle spasms. 04/12/20  Yes Kristeen Miss, MD  Multiple Vitamin (MULTIVITAMIN) tablet Take 1 tablet by mouth daily.   Yes [provider]  omeprazole (PRILOSEC OTC) 20 MG tablet Take 20 mg by mouth at bedtime.    Yes [provider]  gabapentin (NEURONTIN) 300 MG capsule Take 300 mg by mouth at bedtime as needed. Patient not taking: No sig reported    [provider]  oxyCODONE-acetaminophen (PERCOCET/ROXICET) 5-325 MG tablet Take 1-2 tablets by mouth every 3 (three) hours as needed for moderate pain or severe pain. 04/12/20   Kristeen Miss, MD    Allergies    Penicillins and Lipitor [atorvastatin]  Review of Systems   Review of Systems  Constitutional: Negative for fever.  HENT: Negative for sore throat.   Eyes: Negative for visual disturbance.  Respiratory: Positive for cough and shortness of breath.   Cardiovascular: Negative for chest pain.  Gastrointestinal: Positive for abdominal pain. Negative for constipation, diarrhea, nausea and vomiting.  Genitourinary: Negative for difficulty urinating.  Musculoskeletal: Negative for back pain and neck stiffness.  Skin: Negative for rash.  Neurological: Negative for syncope and headaches.    Physical Exam Updated Vital Signs BP 118/81 (BP Location: Left Arm)   Pulse 93   Temp 98.7 F (37.1 C) (Oral)   Resp 20   Ht 5\' 7"  (1.702 m)   Wt 73.9 kg   SpO2 96%   BMI 25.53 kg/m   Physical Exam Vitals and nursing note reviewed.  Constitutional:      General: He is not in acute distress.    Appearance: He is well-developed. He is not diaphoretic.  HENT:     Head: Normocephalic and atraumatic.  Eyes:     Conjunctiva/sclera: Conjunctivae normal.   Cardiovascular:     Rate and Rhythm: Normal rate and regular rhythm.     Heart sounds: Normal heart sounds. No murmur heard. No friction rub. No gallop.   Pulmonary:     Effort: Pulmonary effort is normal. No respiratory distress.     Breath sounds: Normal breath sounds. No wheezing or rales.  Abdominal:     General: There is no distension.     Palpations: Abdomen is soft.     Tenderness: There is abdominal tenderness. There is no guarding. Positive signs include Murphy's sign.  Musculoskeletal:     Cervical back: Normal range of motion.  Skin:    General: Skin is warm and  dry.  Neurological:     Mental Status: He is alert and oriented to person, place, and time.     ED Results / Procedures / Treatments   Labs (all labs ordered are listed, but only abnormal results are displayed) Labs Reviewed  CBC WITH DIFFERENTIAL/PLATELET - Abnormal; Notable for the following components:      Result Value   WBC 11.9 (*)    Neutro Abs 8.4 (*)    Monocytes Absolute 1.2 (*)    All other components within normal limits  COMPREHENSIVE METABOLIC PANEL - Abnormal; Notable for the following components:   Glucose, Bld 128 (*)    Calcium 8.8 (*)    ALT 45 (*)    All other components within normal limits  HEPARIN LEVEL (UNFRACTIONATED) - Abnormal; Notable for the following components:   Heparin Unfractionated <0.10 (*)    All other components within normal limits  HEPARIN LEVEL (UNFRACTIONATED) - Abnormal; Notable for the following components:   Heparin Unfractionated 0.28 (*)    All other components within normal limits  RESP PANEL BY RT-PCR (FLU A&B, COVID) ARPGX2  LIPASE, BLOOD  APTT  PROTIME-INR  CBC  HEPARIN LEVEL (UNFRACTIONATED)  TROPONIN I (HIGH SENSITIVITY)  TROPONIN I (HIGH SENSITIVITY)    EKG EKG Interpretation  Date/Time:  Monday August 12 2020 10:32:36 EDT Ventricular Rate:  103 PR Interval:  148 QRS Duration: 80 QT Interval:  340 QTC Calculation: 445 R  Axis:   16 Text Interpretation: Sinus tachycardia Cannot rule out Anterior infarct , age undetermined Abnormal ECG Sine prior ECG, rate has increased , nonspecific TW changes Confirmed by Gareth Morgan [phone]) on 08/12/2020 11:59:25 AM   Radiology CT Angio Chest PE W and/or Wo Contrast  Result Date: 08/12/2020 CLINICAL DATA:  Chest pain. EXAM: CT ANGIOGRAPHY CHEST WITH CONTRAST TECHNIQUE: Multidetector CT imaging of the chest was performed using the standard protocol during bolus administration of intravenous contrast. Multiplanar CT image reconstructions and MIPs were obtained to evaluate the vascular anatomy. CONTRAST:  130mL OMNIPAQUE IOHEXOL 350 MG/ML SOLN COMPARISON:  January 16, 2020. FINDINGS: Cardiovascular: Large filling defect is seen in lower lobe branch of right pulmonary artery consistent with pulmonary embolus. No pericardial effusion is noted. RV/LV ratio of 1.3 is noted suggesting right heart strain. No evidence of thoracic aortic dissection or aneurysm. Mediastinum/Nodes: No enlarged mediastinal, hilar, or axillary lymph nodes. Thyroid gland, trachea, and esophagus demonstrate no significant findings. Lungs/Pleura: No pneumothorax or pleural effusion is noted. Stable bibasilar atelectasis or infiltrates are noted, right greater than left. Upper Abdomen: Hepatic steatosis is noted. Musculoskeletal: No chest wall abnormality. No acute or significant osseous findings. Review of the MIP images confirms the above findings. IMPRESSION: 1. Large filling defect is seen in lower lobe branch of right pulmonary artery consistent with pulmonary embolus. RV/LV ratio of 1.3 is noted suggesting right heart strain. Critical Value/emergent results were called by telephone at the time of interpretation on 08/12/2020 at 1:09 pm to provider [facility] , who verbally acknowledged these results. 2. Stable bibasilar atelectasis or infiltrates are noted, right greater than left. 3. Hepatic steatosis.  Electronically Signed   By: Marijo Conception M.D.   On: 08/12/2020 13:09   DG Chest Portable 1 View  Result Date: 08/12/2020 CLINICAL DATA:  70 year old male with a history of chest pain EXAM: PORTABLE CHEST 1 VIEW COMPARISON:  05/18/2013, CT chest 01/16/2020 FINDINGS: Cardiomediastinal silhouette unchanged in size and contour. Low lung volumes accentuate the central vascular structures. No pneumothorax. Linear  opacities at bilateral lung bases. No evidence of large pleural effusion. Surgical changes of the cervical region. No acute displaced fracture. IMPRESSION: Low lung volumes with likely basilar atelectasis. Electronically Signed   By: Corrie Mckusick D.O.   On: 08/12/2020 12:38   US Abdomen Limited RUQ (LIVER/GB)  Result Date: 08/12/2020 CLINICAL DATA:  Upper abdominal pain EXAM: ULTRASOUND ABDOMEN LIMITED RIGHT UPPER QUADRANT COMPARISON:  Abdominal ultrasound July 18, 2015 FINDINGS: Gallbladder: No gallstones or wall thickening visualized. There is no pericholecystic fluid. No sonographic Murphy sign noted by sonographer. Common bile duct: Diameter: 3 mm. No intrahepatic or extrahepatic biliary duct dilatation Liver: No focal lesion identified. Liver echogenicity is increased diffusely. Portal vein is patent on color Doppler imaging with normal direction of blood flow towards the liver. Other: None. IMPRESSION: Diffuse increase in liver echogenicity, a finding indicative of hepatic steatosis. No focal liver lesions evident. It must be cautioned that the sensitivity of ultrasound for detection of focal liver lesions is diminished in this circumstance. Study otherwise unremarkable. Electronically Signed   By: Lowella Grip III M.D.   On: 08/12/2020 11:54    Procedures .Critical Care Performed by: Gareth Morgan, MD Authorized by: Gareth Morgan, MD   Critical care provider statement:    Critical care time (minutes):  45   Critical care was time spent personally by me on the following  activities:  Discussions with consultants, evaluation of patient's response to treatment, examination of patient, ordering and performing treatments and interventions, ordering and review of laboratory studies, ordering and review of radiographic studies, pulse oximetry, re-evaluation of patient's condition, obtaining history from patient or surrogate and review of old charts     Medications Ordered in ED Medications  heparin ADULT infusion 100 units/mL (25000 units/259mL) (1,450 Units/hr Intravenous Rate/Dose Change 08/12/20 2228)  acetaminophen (TYLENOL) tablet 650 mg (has no administration in time range)    Or  acetaminophen (TYLENOL) suppository 650 mg (has no administration in time range)  iohexol (OMNIPAQUE) 350 MG/ML injection 100 mL (100 mLs Intravenous Contrast Given 08/12/20 1254)  heparin bolus via infusion 5,500 Units (5,500 Units Intravenous Bolus from Bag 08/12/20 1409)  LORazepam (ATIVAN) tablet 1 mg (1 mg Oral Given 08/12/20 1654)  heparin bolus via infusion 1,100 Units (1,100 Units Intravenous Bolus from Bag 08/12/20 2218)    ED Course  I have reviewed the triage vital signs and the nursing notes.  Pertinent labs & imaging results that were available during my care of the patient were reviewed by me and considered in my medical decision making (see chart for details).    MDM Rules/Calculators/A&P                          70yo male with history of PE in August no longer on anticoagulation, hx of prostate cancer, nephrolithiasis, presents with concern for right upper quadrant pain radiating to right chest.  DDx includes cholecystitis, PE, ACS, pneumonia. RUQ [place] with likely fatty liver without cholecystitis.  PE study shows PE with right heart strain. Vital signs without acute abnormalities, normal oxygenation, HR, BP and troponin WNL.  Discussed with Dr. Lamonte Sakai of pulmonary who agrees with heparin and not scheduling emergent catheter directed thrombolysis at this time.    Final  Clinical Impression(s) / ED Diagnoses Final diagnoses:  RUQ abdominal pain  Acute pulmonary embolism, unspecified pulmonary embolism type, unspecified whether acute cor pulmonale present ([place])    Rx / DC Orders ED Discharge Orders  None       Gareth Morgan, MD 08/12/20 2242

## 2020-08-13 ENCOUNTER — Observation Stay (HOSPITAL_BASED_OUTPATIENT_CLINIC_OR_DEPARTMENT_OTHER): Payer: Medicare Other

## 2020-08-13 ENCOUNTER — Observation Stay (HOSPITAL_COMMUNITY): Payer: Medicare Other

## 2020-08-13 DIAGNOSIS — Z8546 Personal history of malignant neoplasm of prostate: Secondary | ICD-10-CM | POA: Diagnosis not present

## 2020-08-13 DIAGNOSIS — Z880 Allergy status to penicillin: Secondary | ICD-10-CM | POA: Diagnosis not present

## 2020-08-13 DIAGNOSIS — D72829 Elevated white blood cell count, unspecified: Secondary | ICD-10-CM | POA: Diagnosis not present

## 2020-08-13 DIAGNOSIS — I2699 Other pulmonary embolism without acute cor pulmonale: Secondary | ICD-10-CM | POA: Diagnosis present

## 2020-08-13 DIAGNOSIS — K219 Gastro-esophageal reflux disease without esophagitis: Secondary | ICD-10-CM | POA: Diagnosis present

## 2020-08-13 DIAGNOSIS — Z20822 Contact with and (suspected) exposure to covid-19: Secondary | ICD-10-CM | POA: Diagnosis present

## 2020-08-13 DIAGNOSIS — M549 Dorsalgia, unspecified: Secondary | ICD-10-CM | POA: Diagnosis present

## 2020-08-13 DIAGNOSIS — E7800 Pure hypercholesterolemia, unspecified: Secondary | ICD-10-CM | POA: Diagnosis present

## 2020-08-13 DIAGNOSIS — R1011 Right upper quadrant pain: Secondary | ICD-10-CM | POA: Diagnosis not present

## 2020-08-13 DIAGNOSIS — Z981 Arthrodesis status: Secondary | ICD-10-CM | POA: Diagnosis not present

## 2020-08-13 DIAGNOSIS — Z9079 Acquired absence of other genital organ(s): Secondary | ICD-10-CM | POA: Diagnosis not present

## 2020-08-13 DIAGNOSIS — Z820 Family history of epilepsy and other diseases of the nervous system: Secondary | ICD-10-CM | POA: Diagnosis not present

## 2020-08-13 DIAGNOSIS — I2609 Other pulmonary embolism with acute cor pulmonale: Secondary | ICD-10-CM | POA: Diagnosis not present

## 2020-08-13 DIAGNOSIS — Z86711 Personal history of pulmonary embolism: Secondary | ICD-10-CM | POA: Diagnosis not present

## 2020-08-13 DIAGNOSIS — Z8249 Family history of ischemic heart disease and other diseases of the circulatory system: Secondary | ICD-10-CM | POA: Diagnosis not present

## 2020-08-13 DIAGNOSIS — E785 Hyperlipidemia, unspecified: Secondary | ICD-10-CM | POA: Diagnosis present

## 2020-08-13 DIAGNOSIS — Z888 Allergy status to other drugs, medicaments and biological substances status: Secondary | ICD-10-CM | POA: Diagnosis not present

## 2020-08-13 DIAGNOSIS — G8929 Other chronic pain: Secondary | ICD-10-CM | POA: Diagnosis present

## 2020-08-13 LAB — ECHOCARDIOGRAM COMPLETE
Area-P 1/2: 3.53 cm2
Calc EF: 48 %
Height: 67 in
S' Lateral: 3.2 cm
Single Plane A2C EF: 50.3 %
Single Plane A4C EF: 47.6 %
Weight: 2608 oz

## 2020-08-13 LAB — CBC
HCT: 45.3 % (ref 39.0–52.0)
Hemoglobin: 15.6 g/dL (ref 13.0–17.0)
MCH: 28.7 pg (ref 26.0–34.0)
MCHC: 34.4 g/dL (ref 30.0–36.0)
MCV: 83.3 fL (ref 80.0–100.0)
Platelets: 274 10*3/uL (ref 150–400)
RBC: 5.44 MIL/uL (ref 4.22–5.81)
RDW: 15 % (ref 11.5–15.5)
WBC: 11.1 10*3/uL — ABNORMAL HIGH (ref 4.0–10.5)
nRBC: 0 % (ref 0.0–0.2)

## 2020-08-13 LAB — HEPARIN LEVEL (UNFRACTIONATED)
Heparin Unfractionated: 0.22 IU/mL — ABNORMAL LOW (ref 0.30–0.70)
Heparin Unfractionated: 0.47 IU/mL (ref 0.30–0.70)

## 2020-08-13 MED ORDER — HEPARIN BOLUS VIA INFUSION
2000.0000 [IU] | Freq: Once | INTRAVENOUS | Status: AC
  Administered 2020-08-13: 2000 [IU] via INTRAVENOUS
  Filled 2020-08-13: qty 2000

## 2020-08-13 MED ORDER — DIPHENHYDRAMINE HCL 25 MG PO CAPS: 25.0000 mg | ORAL_CAPSULE | Freq: Every evening | ORAL | Status: DC | PRN

## 2020-08-13 NOTE — Progress Notes (Signed)
Patient ID: Terry [name], male   DOB: [birth year], 70 y.o.   MRN: [id number]  PROGRESS NOTE    Terry [name]  RWE:[id number] DOB: 10/28/1950 DOA: 08/12/2020 PCP: Shon Baton, MD   Brief Narrative:  70 year old male with history of pulmonary embolism last year treated with Xarelto for 3 months presented with right upper quadrant and right-sided chest pain.  Presentation, ultrasound of abdomen was negative for acute abnormality.  CT angiogram of the chest showed pulmonary embolism with features concerning for right heart strain.  ER provider discussed with on-call pulmonologist Dr. Lamonte Sakai who recommended heparin drip and admission.  Covid testing was negative.  High-sensitivity troponin was negative.  Assessment & Plan:   Pulmonary embolism with right heart strain in a patient with history of pulmonary embolism last year treated with 3 months of Xarelto -CT angiogram of the chest showed pulmonary embolism with features concerning for right heart strain.  Currently on heparin drip.  Follow 2D echo. -Lower extremity duplex ultrasound: Negative for DVT -Possibly switch to oral Xarelto versus Eliquis tomorrow.  Leukocytosis -Probably reactive.  DVT prophylaxis: Heparin drip Code Status: Full Family Communication: None at bedside Disposition Plan: Status is: Observation  The patient will require care spanning > 2 midnights and should be moved to inpatient because: Inpatient level of care appropriate due to severity of illness  Dispo: The patient is from: Home              Anticipated d/c is to: Home              Patient currently is not medically stable to d/c.   Difficult to place patient No  Consultants: ER physician discussed with on-call pulmonologist  Procedures: Echo pending  Antimicrobials: None   Subjective: Patient seen and examined at bedside.  Still complains of some lower chest and right upper quadrant pain on deep breathing.  Denies worsening shortness of breath, fever or  vomiting.  Objective: Vitals:   08/12/20 1730 08/12/20 1844 08/12/20 2056 08/13/20 0548  BP: 114/83 131/78 118/81 (!) 145/87  Pulse: 86 96 93 91  Resp: (!) 21 18 20  (!) 21  Temp:  98.6 F (37 C) 98.7 F (37.1 C) 98 F (36.7 C)  TempSrc:   Oral Oral  SpO2: 98% 97% 96% 97%  Weight:      Height:       No intake or output data in the 24 hours ending 08/13/20 1100 Filed Weights   08/12/20 1024  Weight: 73.9 kg    Examination:  General exam: Appears calm and comfortable.  Currently on room air. Respiratory system: Bilateral decreased breath sounds at bases Cardiovascular system: S1 & S2 heard, Rate controlled Gastrointestinal system: Abdomen is nondistended, soft and nontender. Normal bowel sounds heard. Extremities: No cyanosis, clubbing, edema  Central nervous system: Alert and oriented. No focal neurological deficits. Moving extremities Skin: No rashes, lesions or ulcers Psychiatry: Flat affect   Data Reviewed: I have personally reviewed following labs and imaging studies  CBC: Recent Labs  Lab 08/12/20 1107 08/13/20 0456  WBC 11.9* 11.1*  NEUTROABS 8.4*  --   HGB 15.4 15.6  HCT 44.2 45.3  MCV 83.1 83.3  PLT 230 619   Basic Metabolic Panel: Recent Labs  Lab 08/12/20 1107  NA 135  K 3.5  CL 100  CO2 22  GLUCOSE 128*  BUN 8  CREATININE 0.72  CALCIUM 8.8*   GFR: Estimated Creatinine Clearance: 79.2 mL/min (by C-G formula based on SCr of  0.72 mg/dL). Liver Function Tests: Recent Labs  Lab 08/12/20 1107  AST 25  ALT 45*  ALKPHOS 58  BILITOT 1.0  PROT 6.7  ALBUMIN 3.6   Recent Labs  Lab 08/12/20 1151  LIPASE 30   No results for input(s): AMMONIA in the last 168 hours. Coagulation Profile: Recent Labs  Lab 08/12/20 1107  INR 1.0   Cardiac Enzymes: No results for input(s): CKTOTAL, CKMB, CKMBINDEX, TROPONINI in the last 168 hours. BNP (last 3 results) No results for input(s): PROBNP in the last 8760 hours. HbA1C: No results for  input(s): HGBA1C in the last 72 hours. CBG: No results for input(s): GLUCAP in the last 168 hours. Lipid Profile: No results for input(s): CHOL, HDL, LDLCALC, TRIG, CHOLHDL, LDLDIRECT in the last 72 hours. Thyroid Function Tests: No results for input(s): TSH, T4TOTAL, FREET4, T3FREE, THYROIDAB in the last 72 hours. Anemia Panel: No results for input(s): VITAMINB12, FOLATE, FERRITIN, TIBC, IRON, RETICCTPCT in the last 72 hours. Sepsis Labs: No results for input(s): PROCALCITON, LATICACIDVEN in the last 168 hours.  Recent Results (from the past 240 hour(s))  Resp Panel by RT-PCR (Flu A&B, Covid) Nasopharyngeal Swab     Status: None   Collection Time: 08/12/20  5:39 PM   Specimen: Nasopharyngeal Swab; Nasopharyngeal(NP) swabs in vial transport medium  Result Value Ref Range Status   SARS Coronavirus 2 by RT PCR NEGATIVE NEGATIVE Final    Comment: (NOTE) SARS-CoV-2 target nucleic acids are NOT DETECTED.  The SARS-CoV-2 RNA is generally detectable in upper respiratory specimens during the acute phase of infection. The lowest concentration of SARS-CoV-2 viral copies this assay can detect is 138 copies/mL. A negative result does not preclude SARS-Cov-2 infection and should not be used as the sole basis for treatment or other patient management decisions. A negative result may occur with  improper specimen collection/handling, submission of specimen other than nasopharyngeal swab, presence of viral mutation(s) within the areas targeted by this assay, and inadequate number of viral copies(<138 copies/mL). A negative result must be combined with clinical observations, patient history, and epidemiological information. The expected result is Negative.  Fact Sheet for Patients:  EntrepreneurPulse.com.au  Fact Sheet for Healthcare Providers:  IncredibleEmployment.be  This test is no t yet approved or cleared by the [place] FDA and  has been  authorized for detection and/or diagnosis of SARS-CoV-2 by FDA under an Emergency Use Authorization (EUA). This EUA will remain  in effect (meaning this test can be used) for the duration of the COVID-19 declaration under Section 564(b)(1) of the Act, 21 U.S.C.section 360bbb-3(b)(1), unless the authorization is terminated  or revoked sooner.       Influenza A by PCR NEGATIVE NEGATIVE Final   Influenza B by PCR NEGATIVE NEGATIVE Final    Comment: (NOTE) The Xpert Xpress SARS-CoV-2/FLU/RSV plus assay is intended as an aid in the diagnosis of influenza from Nasopharyngeal swab specimens and should not be used as a sole basis for treatment. Nasal washings and aspirates are unacceptable for Xpert Xpress SARS-CoV-2/FLU/RSV testing.  Fact Sheet for Patients: EntrepreneurPulse.com.au  Fact Sheet for Healthcare Providers: IncredibleEmployment.be  This test is not yet approved or cleared by the [place] FDA and has been authorized for detection and/or diagnosis of SARS-CoV-2 by FDA under an Emergency Use Authorization (EUA). This EUA will remain in effect (meaning this test can be used) for the duration of the COVID-19 declaration under Section 564(b)(1) of the Act, 21 U.S.C. section 360bbb-3(b)(1), unless the authorization is terminated or revoked.  Performed at [facility], [address] [place], [place] [address]          Radiology Studies: CT Angio Chest PE W and/or Wo Contrast  Result Date: 08/12/2020 CLINICAL DATA:  Chest pain. EXAM: CT ANGIOGRAPHY CHEST WITH CONTRAST TECHNIQUE: Multidetector CT imaging of the chest was performed using the standard protocol during bolus administration of intravenous contrast. Multiplanar CT image reconstructions and MIPs were obtained to evaluate the vascular anatomy. CONTRAST:  142mL OMNIPAQUE IOHEXOL 350 MG/ML SOLN COMPARISON:  January 16, 2020. FINDINGS: Cardiovascular: Large filling  defect is seen in lower lobe branch of right pulmonary artery consistent with pulmonary embolus. No pericardial effusion is noted. RV/LV ratio of 1.3 is noted suggesting right heart strain. No evidence of thoracic aortic dissection or aneurysm. Mediastinum/Nodes: No enlarged mediastinal, hilar, or axillary lymph nodes. Thyroid gland, trachea, and esophagus demonstrate no significant findings. Lungs/Pleura: No pneumothorax or pleural effusion is noted. Stable bibasilar atelectasis or infiltrates are noted, right greater than left. Upper Abdomen: Hepatic steatosis is noted. Musculoskeletal: No chest wall abnormality. No acute or significant osseous findings. Review of the MIP images confirms the above findings. IMPRESSION: 1. Large filling defect is seen in lower lobe branch of right pulmonary artery consistent with pulmonary embolus. RV/LV ratio of 1.3 is noted suggesting right heart strain. Critical Value/emergent results were called by telephone at the time of interpretation on 08/12/2020 at 1:09 pm to provider [facility] , who verbally acknowledged these results. 2. Stable bibasilar atelectasis or infiltrates are noted, right greater than left. 3. Hepatic steatosis. Electronically Signed   By: Marijo Conception M.D.   On: 08/12/2020 13:09   DG Chest Portable 1 View  Result Date: 08/12/2020 CLINICAL DATA:  70 year old male with a history of chest pain EXAM: PORTABLE CHEST 1 VIEW COMPARISON:  05/18/2013, CT chest 01/16/2020 FINDINGS: Cardiomediastinal silhouette unchanged in size and contour. Low lung volumes accentuate the central vascular structures. No pneumothorax. Linear opacities at bilateral lung bases. No evidence of large pleural effusion. Surgical changes of the cervical region. No acute displaced fracture. IMPRESSION: Low lung volumes with likely basilar atelectasis. Electronically Signed   By: Corrie Mckusick D.O.   On: 08/12/2020 12:38   ECHOCARDIOGRAM COMPLETE  Result Date: 08/13/2020     ECHOCARDIOGRAM REPORT   Patient Name:   DELONTE MUSICH Date of Exam: 08/13/2020 Medical Rec #:  [id number]      Height:       67.0 in Accession #:    [id number]     Weight:       163.0 lb Date of Birth:  [birth year]      BSA:          1.854 m Patient Age:    61 years       BP:           145/87 mmHg Patient Gender: M              HR:           65 bpm. Exam Location:  Inpatient Procedure: 2D Echo, Cardiac Doppler and Color Doppler Indications:    Pulmonary Embolus I26.09  History:        Patient has no prior history of Echocardiogram examinations.                 Risk Factors:Dyslipidemia and Non-Smoker.  Sonographer:    Vickie Epley RDCS Referring Phys: [place]  1. Left ventricular ejection fraction, by  estimation, is 55 to 60%. The left ventricle has normal function. The left ventricle has no regional wall motion abnormalities. Left ventricular diastolic parameters were normal.  2. Right ventricular systolic function is normal. The right ventricular size is normal. Tricuspid regurgitation signal is inadequate for assessing PA pressure.  3. The mitral valve is normal in structure. No evidence of mitral valve regurgitation. No evidence of mitral stenosis.  4. The aortic valve is normal in structure. There is mild calcification of the aortic valve. There is mild thickening of the aortic valve. Aortic valve regurgitation is not visualized. Mild aortic valve sclerosis is present, with no evidence of aortic valve stenosis.  5. The inferior vena cava is normal in size with greater than 50% respiratory variability, suggesting right atrial pressure of 3 mmHg. FINDINGS  Left Ventricle: Left ventricular ejection fraction, by estimation, is 55 to 60%. The left ventricle has normal function. The left ventricle has no regional wall motion abnormalities. The left ventricular internal cavity size was normal in size. There is  no left ventricular hypertrophy. Left ventricular diastolic parameters were  normal. Normal left ventricular filling pressure. Right Ventricle: The right ventricular size is normal. No increase in right ventricular wall thickness. Right ventricular systolic function is normal. Tricuspid regurgitation signal is inadequate for assessing PA pressure. Left Atrium: Left atrial size was normal in size. Right Atrium: Right atrial size was normal in size. Pericardium: There is no evidence of pericardial effusion. Mitral Valve: The mitral valve is normal in structure. No evidence of mitral valve regurgitation. No evidence of mitral valve stenosis. Tricuspid Valve: The tricuspid valve is normal in structure. Tricuspid valve regurgitation is not demonstrated. No evidence of tricuspid stenosis. Aortic Valve: The aortic valve is normal in structure. There is mild calcification of the aortic valve. There is mild thickening of the aortic valve. Aortic valve regurgitation is not visualized. Mild aortic valve sclerosis is present, with no evidence of aortic valve stenosis. Pulmonic Valve: The pulmonic valve was normal in structure. Pulmonic valve regurgitation is not visualized. No evidence of pulmonic stenosis. Aorta: The aortic root is normal in size and structure. Venous: The inferior vena cava is normal in size with greater than 50% respiratory variability, suggesting right atrial pressure of 3 mmHg. IAS/Shunts: No atrial level shunt detected by color flow Doppler.  LEFT VENTRICLE PLAX 2D LVIDd:         4.40 cm     Diastology LVIDs:         3.20 cm     LV e' medial:    7.14 cm/s LV PW:         0.90 cm     LV E/e' medial:  9.1 LV IVS:        0.90 cm     LV e' lateral:   7.87 cm/s LVOT diam:     2.10 cm     LV E/e' lateral: 8.2 LV SV:         54 LV SV Index:   29 LVOT Area:     3.46 cm  LV Volumes (MOD) LV vol d, MOD A2C: 98.8 ml LV vol d, MOD A4C: 83.0 ml LV vol s, MOD A2C: 49.1 ml LV vol s, MOD A4C: 43.5 ml LV SV MOD A2C:     49.7 ml LV SV MOD A4C:     83.0 ml LV SV MOD BP:      44.0 ml RIGHT VENTRICLE  RV S prime:     10.90 cm/s  TAPSE (M-mode): 1.8 cm LEFT ATRIUM           Index       RIGHT ATRIUM          Index LA diam:      2.80 cm 1.51 cm/m  RA Area:     9.54 cm LA Vol (A2C): 29.2 ml 15.75 ml/m RA Volume:   20.80 ml 11.22 ml/m LA Vol (A4C): 10.5 ml 5.66 ml/m  AORTIC VALVE LVOT Vmax:   73.70 cm/s LVOT Vmean:  50.400 cm/s LVOT VTI:    0.157 m  AORTA Ao Root diam: 3.50 cm Ao Asc diam:  3.60 cm MITRAL VALVE MV Area (PHT): 3.53 cm    SHUNTS MV Decel Time: 215 msec    Systemic VTI:  0.16 m MV E velocity: 64.80 cm/s  Systemic Diam: 2.10 cm MV A velocity: 54.50 cm/s MV E/A ratio:  1.19 Mihai Croitoru MD Electronically signed by Sanda Klein MD Signature Date/Time: 08/13/2020/9:41:07 AM    Final    VAS [place] LOWER EXTREMITY VENOUS (DVT)  Result Date: 08/13/2020  Lower Venous DVT Study Indications: Pulmonary embolism.  Risk Factors: None identified Cancer Prostate. Comparison Study: No previous Performing Technologist: Vonzell Schlatter RVT  Examination Guidelines: A complete evaluation includes B-mode imaging, spectral Doppler, color Doppler, and power Doppler as needed of all accessible portions of each vessel. Bilateral testing is considered an integral part of a complete examination. Limited examinations for reoccurring indications may be performed as noted. The reflux portion of the exam is performed with the patient in reverse Trendelenburg.  +---------+---------------+---------+-----------+----------+--------------+ RIGHT    CompressibilityPhasicitySpontaneityPropertiesThrombus Aging +---------+---------------+---------+-----------+----------+--------------+ CFV      Full           Yes      Yes                                 +---------+---------------+---------+-----------+----------+--------------+ SFJ      Full                                                        +---------+---------------+---------+-----------+----------+--------------+ FV Prox  Full                                                         +---------+---------------+---------+-----------+----------+--------------+ FV Mid   Full                                                        +---------+---------------+---------+-----------+----------+--------------+ FV DistalFull                                                        +---------+---------------+---------+-----------+----------+--------------+ PFV      Full                                                        +---------+---------------+---------+-----------+----------+--------------+  POP      Full           Yes      Yes                                 +---------+---------------+---------+-----------+----------+--------------+ PTV      Full                                                        +---------+---------------+---------+-----------+----------+--------------+ PERO     Full                                                        +---------+---------------+---------+-----------+----------+--------------+   +---------+---------------+---------+-----------+----------+--------------+ LEFT     CompressibilityPhasicitySpontaneityPropertiesThrombus Aging +---------+---------------+---------+-----------+----------+--------------+ CFV      Full           Yes      Yes                                 +---------+---------------+---------+-----------+----------+--------------+ SFJ      Full                                                        +---------+---------------+---------+-----------+----------+--------------+ FV Prox  Full                                                        +---------+---------------+---------+-----------+----------+--------------+ FV Mid   Full                                                        +---------+---------------+---------+-----------+----------+--------------+ FV DistalFull                                                         +---------+---------------+---------+-----------+----------+--------------+ PFV      Full                                                        +---------+---------------+---------+-----------+----------+--------------+ POP      Full           Yes      Yes                                 +---------+---------------+---------+-----------+----------+--------------+  PTV      Full                                                        +---------+---------------+---------+-----------+----------+--------------+ PERO     Full                                                        +---------+---------------+---------+-----------+----------+--------------+  Summary: BILATERAL: - No evidence of deep vein thrombosis seen in the lower extremities, bilaterally. - No evidence of superficial venous thrombosis in the lower extremities, bilaterally. -No evidence of popliteal cyst, bilaterally.   *See table(s) above for measurements and observations.    Preliminary    US Abdomen Limited RUQ (LIVER/GB)  Result Date: 08/12/2020 CLINICAL DATA:  Upper abdominal pain EXAM: ULTRASOUND ABDOMEN LIMITED RIGHT UPPER QUADRANT COMPARISON:  Abdominal ultrasound July 18, 2015 FINDINGS: Gallbladder: No gallstones or wall thickening visualized. There is no pericholecystic fluid. No sonographic Murphy sign noted by sonographer. Common bile duct: Diameter: 3 mm. No intrahepatic or extrahepatic biliary duct dilatation Liver: No focal lesion identified. Liver echogenicity is increased diffusely. Portal vein is patent on color Doppler imaging with normal direction of blood flow towards the liver. Other: None. IMPRESSION: Diffuse increase in liver echogenicity, a finding indicative of hepatic steatosis. No focal liver lesions evident. It must be cautioned that the sensitivity of ultrasound for detection of focal liver lesions is diminished in this circumstance. Study otherwise unremarkable. Electronically Signed   By:  Lowella Grip III M.D.   On: 08/12/2020 11:54        Scheduled Meds: Continuous Infusions: . heparin 1,600 Units/hr (08/13/20 0175)          Aline August, MD [facility] 08/13/2020, 11:00 AM

## 2020-08-13 NOTE — Progress Notes (Signed)
Bilateral lower extremity venous study completed.      Please see CV Proc for preliminary results.   Cecylia Brazill, RVT  

## 2020-08-13 NOTE — Progress Notes (Signed)
  Echocardiogram 2D Echocardiogram has been performed.  Terry [name] 08/13/2020, 9:16 AM

## 2020-08-13 NOTE — Progress Notes (Signed)
ANTICOAGULATION CONSULT NOTE - [place] for IV Heparin Indication: pulmonary embolus  Allergies  Allergen Reactions  . Penicillins Hives, Itching and Other (See Comments)    Has patient had a PCN reaction causing immediate rash, facial/tongue/throat swelling, SOB or lightheadedness with hypotension: No Has patient had a PCN reaction causing SEVERE RASH INVOLVING MUCUS MEMBRANES or SKIN NECROSIS: #  #  #  YES  #  #  #  Has patient had a PCN reaction that required hospitalization No Has patient had a PCN reaction occurring within the last 10 years: No   . Lipitor [Atorvastatin] Other (See Comments)    Muscle issues    Patient Measurements: Height: 5\' 7"  (170.2 cm) Weight: 73.9 kg (163 lb) IBW/kg (Calculated) : 66.1 Heparin Dosing Weight: 74 kg  Vital Signs: Temp: 97.8 F (36.6 C) (03/15 1200) Temp Source: Oral (03/15 1200) BP: 109/74 (03/15 1200) Pulse Rate: 82 (03/15 1200)  Labs: Recent Labs    08/12/20 1107 08/12/20 1328 08/12/20 1401 08/12/20 2125 08/13/20 0456 08/13/20 1432  HGB 15.4  --   --   --  15.6  --   HCT 44.2  --   --   --  45.3  --   PLT 230  --   --   --  274  --   APTT 31  --   --   --   --   --   LABPROT 12.8  --   --   --   --   --   INR 1.0  --   --   --   --   --   HEPARINUNFRC  --   --    < > 0.28* 0.22* 0.47  CREATININE 0.72  --   --   --   --   --   TROPONINIHS 3 3  --   --   --   --    < > = values in this interval not displayed.    Estimated Creatinine Clearance: 79.2 mL/min (by C-G formula based on SCr of 0.72 mg/dL).   Medical History: Past Medical History:  Diagnosis Date  . Arthritis    neck, back, knees, right ankle  . Bladder calculus   . Chronic back pain   . Complication of anesthesia    dizziness and "crazy amount of bile"  . GERD (gastroesophageal reflux disease)   . History of colon polyps    benign  . History of kidney stones    and bladder stone  . History of prostate cancer urologist--  dr Alinda Money   dx 2014---  s/p  prostatectomy 05-22-2013 ,  Gleason 3+3  . Hypercholesterolemia    takes Crestor daily  . Hyperlipidemia   . Pneumonia 10/2019  . Pulmonary emboli ([place]) 10/2019  . Wears glasses     Assessment: 70 yr old male admitted with new PE with RHS; also has hx of PE in 8/21 for which he rec'd treatment with Xarelto, last dose 05/2020. Pharmacy was consulted to dose IV heparin; pt was not on anticoagulant PTA.  Heparin level drawn ~7 hrs after heparin infusion was increased to 1600 units/hr was 0.47 units/ml, which is within the goal range for this pt. H/H, plt WNL/stable. Per RN, no issues with IV or bleeding observed.  Vascular U/S today was negative for DVT  Goal of Therapy:  Heparin level 0.3-0.7 units/ml Monitor platelets by anticoagulation protocol: Yes   Plan:  Continue heparin infusion at 1600  units/hr Check confirmatory heparin level in ~7 hrs Monitor daily heparin level, CBC Monitor for bleeding F/U transition to oral anticoagulant when able  Gillermina Hu, PharmD, BCPS, [facility] Clinical Pharmacist 08/13/2020,3:54 PM

## 2020-08-13 NOTE — Care Management Obs Status (Signed)
MEDICARE OBSERVATION STATUS NOTIFICATION   Patient Details  Name: Terry [name] MRN: [id number] Date of Birth: [birth year]   Medicare Observation Status Notification Given:  Yes    Joanne Chars, LCSW 08/13/2020, 4:00 PM

## 2020-08-14 ENCOUNTER — Other Ambulatory Visit: Payer: Self-pay | Admitting: Internal Medicine

## 2020-08-14 DIAGNOSIS — I2699 Other pulmonary embolism without acute cor pulmonale: Principal | ICD-10-CM

## 2020-08-14 LAB — CBC
HCT: 44.6 % (ref 39.0–52.0)
Hemoglobin: 14.7 g/dL (ref 13.0–17.0)
MCH: 28.1 pg (ref 26.0–34.0)
MCHC: 33 g/dL (ref 30.0–36.0)
MCV: 85.3 fL (ref 80.0–100.0)
Platelets: 294 10*3/uL (ref 150–400)
RBC: 5.23 MIL/uL (ref 4.22–5.81)
RDW: 14.7 % (ref 11.5–15.5)
WBC: 8.5 10*3/uL (ref 4.0–10.5)
nRBC: 0 % (ref 0.0–0.2)

## 2020-08-14 LAB — HEPARIN LEVEL (UNFRACTIONATED)
Heparin Unfractionated: 0.26 IU/mL — ABNORMAL LOW (ref 0.30–0.70)
Heparin Unfractionated: 0.47 IU/mL (ref 0.30–0.70)

## 2020-08-14 MED ORDER — RIVAROXABAN 15 MG PO TABS: 15.0000 mg | ORAL_TABLET | Freq: Two times a day (BID) | ORAL | Status: DC

## 2020-08-14 MED ORDER — RIVAROXABAN (XARELTO) VTE STARTER PACK (15 & 20 MG): ORAL_TABLET | ORAL | 0 refills | Status: DC

## 2020-08-14 MED ORDER — RIVAROXABAN 20 MG PO TABS: 20.0000 mg | ORAL_TABLET | Freq: Every day | ORAL | Status: DC

## 2020-08-14 MED ORDER — RIVAROXABAN 15 MG PO TABS
15.0000 mg | ORAL_TABLET | ORAL | Status: AC
  Administered 2020-08-14: 15 mg via ORAL
  Filled 2020-08-14: qty 1

## 2020-08-14 NOTE — Discharge Summary (Signed)
Physician Discharge Summary  Terry [name] NFA:[id number] DOB: 1950/11/10 DOA: 08/12/2020  PCP: Shon Baton, MD  Admit date: 08/12/2020 Discharge date: 08/14/2020  Admitted From: Home Disposition: Home  Recommendations for Outpatient Follow-up:  1. Follow up with PCP in 1-2 weeks 2. Please obtain BMP/CBC in one week your next doctors visit.  3. Xarelto starter pack has been ordered 4. Outpatient referral to hematology due to recurrent PE   Discharge Condition: Stable CODE STATUS: Full code Diet recommendation: Regular  Brief/Interim Summary: 70 year old male with history of pulmonary embolism last year treated with Xarelto for 3 months presented with right upper quadrant and right-sided chest pain.  Presentation, ultrasound of abdomen was negative for acute abnormality.  CT angiogram of the chest showed pulmonary embolism with features concerning for right heart strain.  ER provider discussed with on-call pulmonologist Dr. Lamonte Sakai who recommended heparin drip and admission.  Covid testing was negative.  High-sensitivity troponin was negative.  Patient's echocardiogram was unremarkable, he did well on heparin drip.  After my prolonged discussion with the patient and his wife they wanted to go back on Xarelto.  I also made outpatient referral to hematology.  Currently patient is medically stable to be discharged.  Pulmonary embolism without right heart strain in a patient with history of pulmonary embolism last year treated with 3 months of Xarelto -CT angiogram of the chest showed pulmonary embolism with features concerning for right heart strain.    Started on heparin drip.  Echocardiogram was unremarkable which did not show any right heart strain.  Lower extremity Dopplers is negative for DVT.  After my prolonged discussion with the patient and his wife patient will be transition from heparin drip to Xarelto.  He will also follow-up with outpatient hematology due to recurrent  PE.  Leukocytosis -Probably reactive.  Body mass index is 25.53 kg/m.         Discharge Diagnoses:  Principal Problem:   Pulmonary embolism (HCC) Active Problems:   Pulmonary emboli (HCC)    Subjective: Feels great, wishes to go home.  Ambulating without any issues. I answered all of his and his wife's questions  Discharge Exam: Vitals:   08/13/20 2304 08/14/20 0307  BP: 133/87 124/77  Pulse: 74 71  Resp: 18 18  Temp: 97.8 F (36.6 C) 97.7 F (36.5 C)  SpO2: 95% 95%   Vitals:   08/13/20 1200 08/13/20 2024 08/13/20 2304 08/14/20 0307  BP: 109/74 122/82 133/87 124/77  Pulse: 82 81 74 71  Resp: 18 18 18 18   Temp: 97.8 F (36.6 C) 97.9 F (36.6 C) 97.8 F (36.6 C) 97.7 F (36.5 C)  TempSrc: Oral Oral Oral Oral  SpO2: 96% 95% 95% 95%  Weight:      Height:        General: Pt is alert, awake, not in acute distress Cardiovascular: RRR, S1/S2 +, no rubs, no gallops Respiratory: CTA bilaterally, no wheezing, no rhonchi Abdominal: Soft, NT, ND, bowel sounds + Extremities: no edema, no cyanosis  Discharge Instructions  Discharge Instructions    Ambulatory referral to Hematology   Complete by: As directed    For Recurrent PE     Allergies as of 08/14/2020      Reactions   Penicillins Hives, Itching, Other (See Comments)   Has patient had a PCN reaction causing immediate rash, facial/tongue/throat swelling, SOB or lightheadedness with hypotension: No Has patient had a PCN reaction causing SEVERE RASH INVOLVING MUCUS MEMBRANES or SKIN NECROSIS: #  #  #  YES  #  #  #  Has patient had a PCN reaction that required hospitalization No Has patient had a PCN reaction occurring within the last 10 years: No   Lipitor [atorvastatin] Other (See Comments)   Muscle issues      Medication List    TAKE these medications   acetaminophen 500 MG tablet Commonly known as: TYLENOL Take 500 mg by mouth every 6 (six) hours as needed for mild pain.   cetirizine 10 MG  tablet Commonly known as: ZYRTEC Take 10 mg by mouth daily as needed for allergies.   Difluprednate 0.05 % Emul Place 1 drop into the left eye in the morning and at bedtime.   gabapentin 300 MG capsule Commonly known as: NEURONTIN Take 300 mg by mouth at bedtime as needed.   methocarbamol 500 MG tablet Commonly known as: ROBAXIN Take 1 tablet (500 mg total) by mouth every 6 (six) hours as needed for muscle spasms.   multivitamin tablet Take 1 tablet by mouth daily.   omeprazole 20 MG tablet Commonly known as: PRILOSEC OTC Take 20 mg by mouth at bedtime.   oxyCODONE-acetaminophen 5-325 MG tablet Commonly known as: PERCOCET/ROXICET Take 1-2 tablets by mouth every 3 (three) hours as needed for moderate pain or severe pain.   Rivaroxaban Stater Pack (15 mg and 20 mg) Commonly known as: XARELTO STARTER PACK Follow package directions: Take one 15mg  tablet by mouth twice a day. On day 22, switch to one 20mg  tablet once a day. Take with food.       Follow-up Information    Shon Baton, MD. Schedule an appointment as soon as possible for a visit in 1 week(s).   Specialty: Internal Medicine Contact information: [place] [address] [phone]              Allergies  Allergen Reactions  . Penicillins Hives, Itching and Other (See Comments)    Has patient had a PCN reaction causing immediate rash, facial/tongue/throat swelling, SOB or lightheadedness with hypotension: No Has patient had a PCN reaction causing SEVERE RASH INVOLVING MUCUS MEMBRANES or SKIN NECROSIS: #  #  #  YES  #  #  #  Has patient had a PCN reaction that required hospitalization No Has patient had a PCN reaction occurring within the last 10 years: No   . Lipitor [Atorvastatin] Other (See Comments)    Muscle issues    You were cared for by a hospitalist during your hospital stay. If you have any questions about your discharge medications or the care you received while you were in the  hospital after you are discharged, you can call the unit and asked to speak with the hospitalist on call if the hospitalist that took care of you is not available. Once you are discharged, your primary care physician will handle any further medical issues. Please note that no refills for any discharge medications will be authorized once you are discharged, as it is imperative that you return to your primary care physician (or establish a relationship with a primary care physician if you do not have one) for your aftercare needs so that they can reassess your need for medications and monitor your lab values.   Procedures/Studies: CT Angio Chest PE W and/or Wo Contrast  Result Date: 08/12/2020 CLINICAL DATA:  Chest pain. EXAM: CT ANGIOGRAPHY CHEST WITH CONTRAST TECHNIQUE: Multidetector CT imaging of the chest was performed using the standard protocol during bolus administration of intravenous contrast. Multiplanar CT  image reconstructions and MIPs were obtained to evaluate the vascular anatomy. CONTRAST:  165mL OMNIPAQUE IOHEXOL 350 MG/ML SOLN COMPARISON:  January 16, 2020. FINDINGS: Cardiovascular: Large filling defect is seen in lower lobe branch of right pulmonary artery consistent with pulmonary embolus. No pericardial effusion is noted. RV/LV ratio of 1.3 is noted suggesting right heart strain. No evidence of thoracic aortic dissection or aneurysm. Mediastinum/Nodes: No enlarged mediastinal, hilar, or axillary lymph nodes. Thyroid gland, trachea, and esophagus demonstrate no significant findings. Lungs/Pleura: No pneumothorax or pleural effusion is noted. Stable bibasilar atelectasis or infiltrates are noted, right greater than left. Upper Abdomen: Hepatic steatosis is noted. Musculoskeletal: No chest wall abnormality. No acute or significant osseous findings. Review of the MIP images confirms the above findings. IMPRESSION: 1. Large filling defect is seen in lower lobe branch of right pulmonary artery  consistent with pulmonary embolus. RV/LV ratio of 1.3 is noted suggesting right heart strain. Critical Value/emergent results were called by telephone at the time of interpretation on 08/12/2020 at 1:09 pm to provider [facility] , who verbally acknowledged these results. 2. Stable bibasilar atelectasis or infiltrates are noted, right greater than left. 3. Hepatic steatosis. Electronically Signed   By: Marijo Conception M.D.   On: 08/12/2020 13:09   DG Chest Portable 1 View  Result Date: 08/12/2020 CLINICAL DATA:  70 year old male with a history of chest pain EXAM: PORTABLE CHEST 1 VIEW COMPARISON:  05/18/2013, CT chest 01/16/2020 FINDINGS: Cardiomediastinal silhouette unchanged in size and contour. Low lung volumes accentuate the central vascular structures. No pneumothorax. Linear opacities at bilateral lung bases. No evidence of large pleural effusion. Surgical changes of the cervical region. No acute displaced fracture. IMPRESSION: Low lung volumes with likely basilar atelectasis. Electronically Signed   By: Corrie Mckusick D.O.   On: 08/12/2020 12:38   ECHOCARDIOGRAM COMPLETE  Result Date: 08/13/2020    ECHOCARDIOGRAM REPORT   Patient Name:   Terry [name] Date of Exam: 08/13/2020 Medical Rec #:  [id number]      Height:       67.0 in Accession #:    [id number]     Weight:       163.0 lb Date of Birth:  Feb 16, 1951      BSA:          1.854 m Patient Age:    70 years       BP:           145/87 mmHg Patient Gender: M              HR:           65 bpm. Exam Location:  Inpatient Procedure: 2D Echo, Cardiac Doppler and Color Doppler Indications:    Pulmonary Embolus I26.09  History:        Patient has no prior history of Echocardiogram examinations.                 Risk Factors:Dyslipidemia and Non-Smoker.  Sonographer:    Vickie Epley RDCS Referring Phys: [place]  1. Left ventricular ejection fraction, by estimation, is 55 to 60%. The left ventricle has normal function. The left  ventricle has no regional wall motion abnormalities. Left ventricular diastolic parameters were normal.  2. Right ventricular systolic function is normal. The right ventricular size is normal. Tricuspid regurgitation signal is inadequate for assessing PA pressure.  3. The mitral valve is normal in structure. No evidence of mitral valve regurgitation. No evidence of mitral stenosis.  4. The aortic valve is normal in structure. There is mild calcification of the aortic valve. There is mild thickening of the aortic valve. Aortic valve regurgitation is not visualized. Mild aortic valve sclerosis is present, with no evidence of aortic valve stenosis.  5. The inferior vena cava is normal in size with greater than 50% respiratory variability, suggesting right atrial pressure of 3 mmHg. FINDINGS  Left Ventricle: Left ventricular ejection fraction, by estimation, is 55 to 60%. The left ventricle has normal function. The left ventricle has no regional wall motion abnormalities. The left ventricular internal cavity size was normal in size. There is  no left ventricular hypertrophy. Left ventricular diastolic parameters were normal. Normal left ventricular filling pressure. Right Ventricle: The right ventricular size is normal. No increase in right ventricular wall thickness. Right ventricular systolic function is normal. Tricuspid regurgitation signal is inadequate for assessing PA pressure. Left Atrium: Left atrial size was normal in size. Right Atrium: Right atrial size was normal in size. Pericardium: There is no evidence of pericardial effusion. Mitral Valve: The mitral valve is normal in structure. No evidence of mitral valve regurgitation. No evidence of mitral valve stenosis. Tricuspid Valve: The tricuspid valve is normal in structure. Tricuspid valve regurgitation is not demonstrated. No evidence of tricuspid stenosis. Aortic Valve: The aortic valve is normal in structure. There is mild calcification of the aortic  valve. There is mild thickening of the aortic valve. Aortic valve regurgitation is not visualized. Mild aortic valve sclerosis is present, with no evidence of aortic valve stenosis. Pulmonic Valve: The pulmonic valve was normal in structure. Pulmonic valve regurgitation is not visualized. No evidence of pulmonic stenosis. Aorta: The aortic root is normal in size and structure. Venous: The inferior vena cava is normal in size with greater than 50% respiratory variability, suggesting right atrial pressure of 3 mmHg. IAS/Shunts: No atrial level shunt detected by color flow Doppler.  LEFT VENTRICLE PLAX 2D LVIDd:         4.40 cm     Diastology LVIDs:         3.20 cm     LV e' medial:    7.14 cm/s LV PW:         0.90 cm     LV E/e' medial:  9.1 LV IVS:        0.90 cm     LV e' lateral:   7.87 cm/s LVOT diam:     2.10 cm     LV E/e' lateral: 8.2 LV SV:         54 LV SV Index:   29 LVOT Area:     3.46 cm  LV Volumes (MOD) LV vol d, MOD A2C: 98.8 ml LV vol d, MOD A4C: 83.0 ml LV vol s, MOD A2C: 49.1 ml LV vol s, MOD A4C: 43.5 ml LV SV MOD A2C:     49.7 ml LV SV MOD A4C:     83.0 ml LV SV MOD BP:      44.0 ml RIGHT VENTRICLE RV S prime:     10.90 cm/s TAPSE (M-mode): 1.8 cm LEFT ATRIUM           Index       RIGHT ATRIUM          Index LA diam:      2.80 cm 1.51 cm/m  RA Area:     9.54 cm LA Vol (A2C): 29.2 ml 15.75 ml/m RA Volume:   20.80 ml 11.22 ml/m LA  Vol (A4C): 10.5 ml 5.66 ml/m  AORTIC VALVE LVOT Vmax:   73.70 cm/s LVOT Vmean:  50.400 cm/s LVOT VTI:    0.157 m  AORTA Ao Root diam: 3.50 cm Ao Asc diam:  3.60 cm MITRAL VALVE MV Area (PHT): 3.53 cm    SHUNTS MV Decel Time: 215 msec    Systemic VTI:  0.16 m MV E velocity: 64.80 cm/s  Systemic Diam: 2.10 cm MV A velocity: 54.50 cm/s MV E/A ratio:  1.19 Mihai Croitoru MD Electronically signed by Sanda Klein MD Signature Date/Time: 08/13/2020/9:41:07 AM    Final    VAS [place] LOWER EXTREMITY VENOUS (DVT)  Result Date: 08/13/2020  Lower Venous DVT Study Indications:  Pulmonary embolism.  Risk Factors: None identified Cancer Prostate. Comparison Study: No previous Performing Technologist: Vonzell Schlatter RVT  Examination Guidelines: A complete evaluation includes B-mode imaging, spectral Doppler, color Doppler, and power Doppler as needed of all accessible portions of each vessel. Bilateral testing is considered an integral part of a complete examination. Limited examinations for reoccurring indications may be performed as noted. The reflux portion of the exam is performed with the patient in reverse Trendelenburg.  +---------+---------------+---------+-----------+----------+--------------+ RIGHT    CompressibilityPhasicitySpontaneityPropertiesThrombus Aging +---------+---------------+---------+-----------+----------+--------------+ CFV      Full           Yes      Yes                                 +---------+---------------+---------+-----------+----------+--------------+ SFJ      Full                                                        +---------+---------------+---------+-----------+----------+--------------+ FV Prox  Full                                                        +---------+---------------+---------+-----------+----------+--------------+ FV Mid   Full                                                        +---------+---------------+---------+-----------+----------+--------------+ FV DistalFull                                                        +---------+---------------+---------+-----------+----------+--------------+ PFV      Full                                                        +---------+---------------+---------+-----------+----------+--------------+ POP      Full           Yes      Yes                                 +---------+---------------+---------+-----------+----------+--------------+  PTV      Full                                                         +---------+---------------+---------+-----------+----------+--------------+ PERO     Full                                                        +---------+---------------+---------+-----------+----------+--------------+   +---------+---------------+---------+-----------+----------+--------------+ LEFT     CompressibilityPhasicitySpontaneityPropertiesThrombus Aging +---------+---------------+---------+-----------+----------+--------------+ CFV      Full           Yes      Yes                                 +---------+---------------+---------+-----------+----------+--------------+ SFJ      Full                                                        +---------+---------------+---------+-----------+----------+--------------+ FV Prox  Full                                                        +---------+---------------+---------+-----------+----------+--------------+ FV Mid   Full                                                        +---------+---------------+---------+-----------+----------+--------------+ FV DistalFull                                                        +---------+---------------+---------+-----------+----------+--------------+ PFV      Full                                                        +---------+---------------+---------+-----------+----------+--------------+ POP      Full           Yes      Yes                                 +---------+---------------+---------+-----------+----------+--------------+ PTV      Full                                                        +---------+---------------+---------+-----------+----------+--------------+  PERO     Full                                                        +---------+---------------+---------+-----------+----------+--------------+  Summary: BILATERAL: - No evidence of deep vein thrombosis seen in the lower extremities, bilaterally. - No evidence of  superficial venous thrombosis in the lower extremities, bilaterally. -No evidence of popliteal cyst, bilaterally.   *See table(s) above for measurements and observations. Electronically signed by Deitra Mayo MD on 08/13/2020 at 12:34:42 PM.    Final    US Abdomen Limited RUQ (LIVER/GB)  Result Date: 08/12/2020 CLINICAL DATA:  Upper abdominal pain EXAM: ULTRASOUND ABDOMEN LIMITED RIGHT UPPER QUADRANT COMPARISON:  Abdominal ultrasound July 18, 2015 FINDINGS: Gallbladder: No gallstones or wall thickening visualized. There is no pericholecystic fluid. No sonographic Murphy sign noted by sonographer. Common bile duct: Diameter: 3 mm. No intrahepatic or extrahepatic biliary duct dilatation Liver: No focal lesion identified. Liver echogenicity is increased diffusely. Portal vein is patent on color Doppler imaging with normal direction of blood flow towards the liver. Other: None. IMPRESSION: Diffuse increase in liver echogenicity, a finding indicative of hepatic steatosis. No focal liver lesions evident. It must be cautioned that the sensitivity of ultrasound for detection of focal liver lesions is diminished in this circumstance. Study otherwise unremarkable. Electronically Signed   By: Lowella Grip III M.D.   On: 08/12/2020 11:54      The results of significant diagnostics from this hospitalization (including imaging, microbiology, ancillary and laboratory) are listed below for reference.     Microbiology: Recent Results (from the past 240 hour(s))  Resp Panel by RT-PCR (Flu A&B, Covid) Nasopharyngeal Swab     Status: None   Collection Time: 08/12/20  5:39 PM   Specimen: Nasopharyngeal Swab; Nasopharyngeal(NP) swabs in vial transport medium  Result Value Ref Range Status   SARS Coronavirus 2 by RT PCR NEGATIVE NEGATIVE Final    Comment: (NOTE) SARS-CoV-2 target nucleic acids are NOT DETECTED.  The SARS-CoV-2 RNA is generally detectable in upper respiratory specimens during the acute  phase of infection. The lowest concentration of SARS-CoV-2 viral copies this assay can detect is 138 copies/mL. A negative result does not preclude SARS-Cov-2 infection and should not be used as the sole basis for treatment or other patient management decisions. A negative result may occur with  improper specimen collection/handling, submission of specimen other than nasopharyngeal swab, presence of viral mutation(s) within the areas targeted by this assay, and inadequate number of viral copies(<138 copies/mL). A negative result must be combined with clinical observations, patient history, and epidemiological information. The expected result is Negative.  Fact Sheet for Patients:  EntrepreneurPulse.com.au  Fact Sheet for Healthcare Providers:  IncredibleEmployment.be  This test is no t yet approved or cleared by the [place] FDA and  has been authorized for detection and/or diagnosis of SARS-CoV-2 by FDA under an Emergency Use Authorization (EUA). This EUA will remain  in effect (meaning this test can be used) for the duration of the COVID-19 declaration under Section 564(b)(1) of the Act, 21 U.S.C.section 360bbb-3(b)(1), unless the authorization is terminated  or revoked sooner.       Influenza A by PCR NEGATIVE NEGATIVE Final   Influenza B by PCR NEGATIVE NEGATIVE Final    Comment: (NOTE) The Xpert Xpress SARS-CoV-2/FLU/RSV plus assay  is intended as an aid in the diagnosis of influenza from Nasopharyngeal swab specimens and should not be used as a sole basis for treatment. Nasal washings and aspirates are unacceptable for Xpert Xpress SARS-CoV-2/FLU/RSV testing.  Fact Sheet for Patients: EntrepreneurPulse.com.au  Fact Sheet for Healthcare Providers: IncredibleEmployment.be  This test is not yet approved or cleared by the [place] FDA and has been authorized for detection and/or diagnosis of  SARS-CoV-2 by FDA under an Emergency Use Authorization (EUA). This EUA will remain in effect (meaning this test can be used) for the duration of the COVID-19 declaration under Section 564(b)(1) of the Act, 21 U.S.C. section 360bbb-3(b)(1), unless the authorization is terminated or revoked.  Performed at [facility], [address] [place], [place] [address]      Labs: BNP (last 3 results) No results for input(s): BNP in the last 8760 hours. Basic Metabolic Panel: Recent Labs  Lab 08/12/20 1107  NA 135  K 3.5  CL 100  CO2 22  GLUCOSE 128*  BUN 8  CREATININE 0.72  CALCIUM 8.8*   Liver Function Tests: Recent Labs  Lab 08/12/20 1107  AST 25  ALT 45*  ALKPHOS 58  BILITOT 1.0  PROT 6.7  ALBUMIN 3.6   Recent Labs  Lab 08/12/20 1151  LIPASE 30   No results for input(s): AMMONIA in the last 168 hours. CBC: Recent Labs  Lab 08/12/20 1107 08/13/20 0456 08/14/20 0839  WBC 11.9* 11.1* 8.5  NEUTROABS 8.4*  --   --   HGB 15.4 15.6 14.7  HCT 44.2 45.3 44.6  MCV 83.1 83.3 85.3  PLT 230 274 294   Cardiac Enzymes: No results for input(s): CKTOTAL, CKMB, CKMBINDEX, TROPONINI in the last 168 hours. BNP: Invalid input(s): POCBNP CBG: No results for input(s): GLUCAP in the last 168 hours. D-Dimer No results for input(s): DDIMER in the last 72 hours. Hgb A1c No results for input(s): HGBA1C in the last 72 hours. Lipid Profile No results for input(s): CHOL, HDL, LDLCALC, TRIG, CHOLHDL, LDLDIRECT in the last 72 hours. Thyroid function studies No results for input(s): TSH, T4TOTAL, T3FREE, THYROIDAB in the last 72 hours.  Invalid input(s): FREET3 Anemia work up No results for input(s): VITAMINB12, FOLATE, FERRITIN, TIBC, IRON, RETICCTPCT in the last 72 hours. Urinalysis No results found for: COLORURINE, APPEARANCEUR, [place], [place], GLUCOSEU, [place], [place], [place], PROTEINUR, UROBILINOGEN, NITRITE, LEUKOCYTESUR Sepsis Labs Invalid input(s):  PROCALCITONIN,  WBC,  LACTICIDVEN Microbiology Recent Results (from the past 240 hour(s))  Resp Panel by RT-PCR (Flu A&B, Covid) Nasopharyngeal Swab     Status: None   Collection Time: 08/12/20  5:39 PM   Specimen: Nasopharyngeal Swab; Nasopharyngeal(NP) swabs in vial transport medium  Result Value Ref Range Status   SARS Coronavirus 2 by RT PCR NEGATIVE NEGATIVE Final    Comment: (NOTE) SARS-CoV-2 target nucleic acids are NOT DETECTED.  The SARS-CoV-2 RNA is generally detectable in upper respiratory specimens during the acute phase of infection. The lowest concentration of SARS-CoV-2 viral copies this assay can detect is 138 copies/mL. A negative result does not preclude SARS-Cov-2 infection and should not be used as the sole basis for treatment or other patient management decisions. A negative result may occur with  improper specimen collection/handling, submission of specimen other than nasopharyngeal swab, presence of viral mutation(s) within the areas targeted by this assay, and inadequate number of viral copies(<138 copies/mL). A negative result must be combined with clinical observations, patient history, and epidemiological information. The expected result is  Negative.  Fact Sheet for Patients:  EntrepreneurPulse.com.au  Fact Sheet for Healthcare Providers:  IncredibleEmployment.be  This test is no t yet approved or cleared by the [place] FDA and  has been authorized for detection and/or diagnosis of SARS-CoV-2 by FDA under an Emergency Use Authorization (EUA). This EUA will remain  in effect (meaning this test can be used) for the duration of the COVID-19 declaration under Section 564(b)(1) of the Act, 21 U.S.C.section 360bbb-3(b)(1), unless the authorization is terminated  or revoked sooner.       Influenza A by PCR NEGATIVE NEGATIVE Final   Influenza B by PCR NEGATIVE NEGATIVE Final    Comment: (NOTE) The Xpert Xpress  SARS-CoV-2/FLU/RSV plus assay is intended as an aid in the diagnosis of influenza from Nasopharyngeal swab specimens and should not be used as a sole basis for treatment. Nasal washings and aspirates are unacceptable for Xpert Xpress SARS-CoV-2/FLU/RSV testing.  Fact Sheet for Patients: EntrepreneurPulse.com.au  Fact Sheet for Healthcare Providers: IncredibleEmployment.be  This test is not yet approved or cleared by the [place] FDA and has been authorized for detection and/or diagnosis of SARS-CoV-2 by FDA under an Emergency Use Authorization (EUA). This EUA will remain in effect (meaning this test can be used) for the duration of the COVID-19 declaration under Section 564(b)(1) of the Act, 21 U.S.C. section 360bbb-3(b)(1), unless the authorization is terminated or revoked.  Performed at [facility], [address] [place], [place] [address]      Time coordinating discharge:  I have spent 35 minutes face to face with the patient and on the ward discussing the patients care, assessment, plan and disposition with other care givers. >50% of the time was devoted counseling the patient about the risks and benefits of treatment/Discharge disposition and coordinating care.   SIGNED:   Damita Lack, MD  [facility] 08/14/2020, 1:13 PM   If 7PM-7AM, please contact night-coverage

## 2020-08-14 NOTE — Progress Notes (Signed)
ANTICOAGULATION CONSULT NOTE - [place] for IV Heparin Indication: pulmonary embolus  Allergies  Allergen Reactions  . Penicillins Hives, Itching and Other (See Comments)    Has patient had a PCN reaction causing immediate rash, facial/tongue/throat swelling, SOB or lightheadedness with hypotension: No Has patient had a PCN reaction causing SEVERE RASH INVOLVING MUCUS MEMBRANES or SKIN NECROSIS: #  #  #  YES  #  #  #  Has patient had a PCN reaction that required hospitalization No Has patient had a PCN reaction occurring within the last 10 years: No   . Lipitor [Atorvastatin] Other (See Comments)    Muscle issues    Patient Measurements: Height: 5\' 7"  (170.2 cm) Weight: 73.9 kg (163 lb) IBW/kg (Calculated) : 66.1 Heparin Dosing Weight: 74 kg  Vital Signs: Temp: 97.8 F (36.6 C) (03/15 2304) Temp Source: Oral (03/15 2304) BP: 133/87 (03/15 2304) Pulse Rate: 74 (03/15 2304)  Labs: Recent Labs    08/12/20 1107 08/12/20 1328 08/12/20 1401 08/13/20 0456 08/13/20 1432 08/13/20 2354  HGB 15.4  --   --  15.6  --   --   HCT 44.2  --   --  45.3  --   --   PLT 230  --   --  274  --   --   APTT 31  --   --   --   --   --   LABPROT 12.8  --   --   --   --   --   INR 1.0  --   --   --   --   --   HEPARINUNFRC  --   --    < > 0.22* 0.47 0.26*  CREATININE 0.72  --   --   --   --   --   TROPONINIHS 3 3  --   --   --   --    < > = values in this interval not displayed.    Estimated Creatinine Clearance: 79.2 mL/min (by C-G formula based on SCr of 0.72 mg/dL).   Medical History: Past Medical History:  Diagnosis Date  . Arthritis    neck, back, knees, right ankle  . Bladder calculus   . Chronic back pain   . Complication of anesthesia    dizziness and "crazy amount of bile"  . GERD (gastroesophageal reflux disease)   . History of colon polyps    benign  . History of kidney stones    and bladder stone  . History of prostate cancer urologist--  dr Alinda Money   dx 2014---  s/p  prostatectomy 05-22-2013 ,  Gleason 3+3  . Hypercholesterolemia    takes Crestor daily  . Hyperlipidemia   . Pneumonia 10/2019  . Pulmonary emboli ([place]) 10/2019  . Wears glasses     Assessment: 70 yr old male admitted with new PE with RHS; also has hx of PE in 8/21 for which he rec'd treatment with Xarelto, last dose 05/2020. Pharmacy was consulted to dose IV heparin; pt was not on anticoagulant PTA.   Vascular U/S today was negative for DVT  3/16 AM update:  Heparin level low  Goal of Therapy:  Heparin level 0.3-0.7 units/ml Monitor platelets by anticoagulation protocol: Yes   Plan:  Inc heparin to 1750 units/hr 0900 HL Monitor daily heparin level, CBC Monitor for bleeding F/U transition to oral anticoagulant when able  Narda Bonds, PharmD, [place] Pharmacist Phone: [phone]

## 2020-08-14 NOTE — Progress Notes (Signed)
ANTICOAGULATION CONSULT NOTE - [place] for IV Heparin>>Xarelto Indication: pulmonary embolus  Allergies  Allergen Reactions  . Penicillins Hives, Itching and Other (See Comments)    Has patient had a PCN reaction causing immediate rash, facial/tongue/throat swelling, SOB or lightheadedness with hypotension: No Has patient had a PCN reaction causing SEVERE RASH INVOLVING MUCUS MEMBRANES or SKIN NECROSIS: #  #  #  YES  #  #  #  Has patient had a PCN reaction that required hospitalization No Has patient had a PCN reaction occurring within the last 10 years: No   . Lipitor [Atorvastatin] Other (See Comments)    Muscle issues    Patient Measurements: Height: 5\' 7"  (170.2 cm) Weight: 73.9 kg (163 lb) IBW/kg (Calculated) : 66.1 Heparin Dosing Weight: 74 kg  Vital Signs: Temp: 97.7 F (36.5 C) (03/16 0307) Temp Source: Oral (03/16 0307) BP: 124/77 (03/16 0307) Pulse Rate: 71 (03/16 0307)  Labs: Recent Labs    08/12/20 1107 08/12/20 1328 08/12/20 1401 08/13/20 0456 08/13/20 1432 08/13/20 2354  HGB 15.4  --   --  15.6  --   --   HCT 44.2  --   --  45.3  --   --   PLT 230  --   --  274  --   --   APTT 31  --   --   --   --   --   LABPROT 12.8  --   --   --   --   --   INR 1.0  --   --   --   --   --   HEPARINUNFRC  --   --    < > 0.22* 0.47 0.26*  CREATININE 0.72  --   --   --   --   --   TROPONINIHS 3 3  --   --   --   --    < > = values in this interval not displayed.    Estimated Creatinine Clearance: 79.2 mL/min (by C-G formula based on SCr of 0.72 mg/dL).   Medical History: Past Medical History:  Diagnosis Date  . Arthritis    neck, back, knees, right ankle  . Bladder calculus   . Chronic back pain   . Complication of anesthesia    dizziness and "crazy amount of bile"  . GERD (gastroesophageal reflux disease)   . History of colon polyps    benign  . History of kidney stones    and bladder stone  . History of prostate cancer  urologist-- dr Alinda Money   dx 2014---  s/p  prostatectomy 05-22-2013 ,  Gleason 3+3  . Hypercholesterolemia    takes Crestor daily  . Hyperlipidemia   . Pneumonia 10/2019  . Pulmonary emboli ([place]) 10/2019  . Wears glasses     Assessment: 70 yr old male admitted with new PE with RHS; also has hx of PE in 8/21 for which he rec'd treatment with Xarelto, last dose 05/2020. Pharmacy was consulted to dose IV heparin; pt was not on anticoagulant PTA.   Vascular U/S today was negative for DVT   Pt is being transition to Xarelto today and likely stay on it lifelong due to recurrent PE. Cbc stable.   Goal of Therapy:  Heparin level 0.3-0.7 units/ml Monitor platelets by anticoagulation protocol: Yes   Plan:  Dc heparin Xarelto 15mg  PO BID W54 days then 20mg  qsupper Rx will follow peripherally  Onnie Boer, PharmD, BCIDP,  AAHIVP, CPP Infectious Disease Pharmacist 08/14/2020 9:44 AM

## 2020-08-14 NOTE — Progress Notes (Signed)
CSW consulted for medication assistance, met with pt and wife, who had also just met with pharmacist.  Copay for xarelto $480.  Pt has been on for several months and has been receiving samples from PCP.  CSW checked and TOC assistance cards are for commerically insured pts only.  Wife will continue to work with PCP to discuss assistance and potentially other medication options moving forward. Lurline Idol, MSW, LCSW 3/16/202211:19 AM

## 2020-08-15 DIAGNOSIS — K829 Disease of gallbladder, unspecified: Secondary | ICD-10-CM | POA: Diagnosis not present

## 2020-08-15 DIAGNOSIS — E785 Hyperlipidemia, unspecified: Secondary | ICD-10-CM | POA: Diagnosis not present

## 2020-08-15 DIAGNOSIS — Z7901 Long term (current) use of anticoagulants: Secondary | ICD-10-CM | POA: Diagnosis not present

## 2020-08-15 DIAGNOSIS — M1990 Unspecified osteoarthritis, unspecified site: Secondary | ICD-10-CM | POA: Diagnosis not present

## 2020-08-15 DIAGNOSIS — R739 Hyperglycemia, unspecified: Secondary | ICD-10-CM | POA: Diagnosis not present

## 2020-08-15 DIAGNOSIS — Z86711 Personal history of pulmonary embolism: Secondary | ICD-10-CM | POA: Diagnosis not present

## 2020-08-15 DIAGNOSIS — F430 Acute stress reaction: Secondary | ICD-10-CM | POA: Diagnosis not present

## 2020-08-15 DIAGNOSIS — Z86010 Personal history of colonic polyps: Secondary | ICD-10-CM | POA: Diagnosis not present

## 2020-08-15 DIAGNOSIS — I8290 Acute embolism and thrombosis of unspecified vein: Secondary | ICD-10-CM | POA: Diagnosis not present

## 2020-08-15 DIAGNOSIS — D692 Other nonthrombocytopenic purpura: Secondary | ICD-10-CM | POA: Diagnosis not present

## 2020-08-19 ENCOUNTER — Telehealth: Payer: Self-pay | Admitting: Family

## 2020-08-19 NOTE — Telephone Encounter (Signed)
Called and spoke with patient regarding New Patient appointments entered.  He was in agreement with both date & time. Letter Murrell Converse was mailed as well

## 2020-08-23 ENCOUNTER — Other Ambulatory Visit: Payer: Medicare Other

## 2020-08-23 ENCOUNTER — Ambulatory Visit: Payer: Medicare Other | Admitting: Family

## 2020-09-10 DIAGNOSIS — H2511 Age-related nuclear cataract, right eye: Secondary | ICD-10-CM | POA: Diagnosis not present

## 2020-09-10 DIAGNOSIS — H25811 Combined forms of age-related cataract, right eye: Secondary | ICD-10-CM | POA: Diagnosis not present

## 2020-09-13 ENCOUNTER — Encounter: Payer: Self-pay | Admitting: Family

## 2020-09-13 ENCOUNTER — Inpatient Hospital Stay (HOSPITAL_BASED_OUTPATIENT_CLINIC_OR_DEPARTMENT_OTHER): Payer: Medicare Other | Admitting: Family

## 2020-09-13 ENCOUNTER — Other Ambulatory Visit: Payer: Self-pay

## 2020-09-13 ENCOUNTER — Inpatient Hospital Stay: Payer: Medicare Other | Attending: Family

## 2020-09-13 ENCOUNTER — Other Ambulatory Visit: Payer: Self-pay | Admitting: Family

## 2020-09-13 VITALS — BP 138/70 | HR 67 | Temp 98.2°F | Resp 18 | Ht 67.0 in | Wt 168.0 lb

## 2020-09-13 DIAGNOSIS — Z8546 Personal history of malignant neoplasm of prostate: Secondary | ICD-10-CM | POA: Diagnosis not present

## 2020-09-13 DIAGNOSIS — I2699 Other pulmonary embolism without acute cor pulmonale: Secondary | ICD-10-CM | POA: Diagnosis not present

## 2020-09-13 DIAGNOSIS — Z87442 Personal history of urinary calculi: Secondary | ICD-10-CM | POA: Diagnosis not present

## 2020-09-13 DIAGNOSIS — Z7901 Long term (current) use of anticoagulants: Secondary | ICD-10-CM

## 2020-09-13 DIAGNOSIS — I2609 Other pulmonary embolism with acute cor pulmonale: Secondary | ICD-10-CM

## 2020-09-13 DIAGNOSIS — D6862 Lupus anticoagulant syndrome: Secondary | ICD-10-CM

## 2020-09-13 DIAGNOSIS — E7211 Homocystinuria: Secondary | ICD-10-CM

## 2020-09-13 DIAGNOSIS — R079 Chest pain, unspecified: Secondary | ICD-10-CM

## 2020-09-13 LAB — CBC WITH DIFFERENTIAL (CANCER CENTER ONLY)
Abs Immature Granulocytes: 0.02 10*3/uL (ref 0.00–0.07)
Basophils Absolute: 0.1 10*3/uL (ref 0.0–0.1)
Basophils Relative: 1 %
Eosinophils Absolute: 0.3 10*3/uL (ref 0.0–0.5)
Eosinophils Relative: 4 %
HCT: 45.5 % (ref 39.0–52.0)
Hemoglobin: 15.2 g/dL (ref 13.0–17.0)
Immature Granulocytes: 0 %
Lymphocytes Relative: 27 %
Lymphs Abs: 2.2 10*3/uL (ref 0.7–4.0)
MCH: 28.8 pg (ref 26.0–34.0)
MCHC: 33.4 g/dL (ref 30.0–36.0)
MCV: 86.3 fL (ref 80.0–100.0)
Monocytes Absolute: 0.6 10*3/uL (ref 0.1–1.0)
Monocytes Relative: 7 %
Neutro Abs: 4.9 10*3/uL (ref 1.7–7.7)
Neutrophils Relative %: 61 %
Platelet Count: 256 10*3/uL (ref 150–400)
RBC: 5.27 MIL/uL (ref 4.22–5.81)
RDW: 14.9 % (ref 11.5–15.5)
WBC Count: 8.2 10*3/uL (ref 4.0–10.5)
nRBC: 0 % (ref 0.0–0.2)

## 2020-09-13 LAB — D-DIMER, QUANTITATIVE: D-Dimer, Quant: 0.63 ug/mL-FEU — ABNORMAL HIGH (ref 0.00–0.50)

## 2020-09-13 LAB — CMP (CANCER CENTER ONLY)
ALT: 18 U/L (ref 0–44)
AST: 18 U/L (ref 15–41)
Albumin: 4.2 g/dL (ref 3.5–5.0)
Alkaline Phosphatase: 62 U/L (ref 38–126)
Anion gap: 8 (ref 5–15)
BUN: 10 mg/dL (ref 8–23)
CO2: 29 mmol/L (ref 22–32)
Calcium: 9.9 mg/dL (ref 8.9–10.3)
Chloride: 102 mmol/L (ref 98–111)
Creatinine: 0.99 mg/dL (ref 0.61–1.24)
GFR, Estimated: >60 mL/min (ref >60)
Glucose, Bld: 126 mg/dL — ABNORMAL HIGH (ref 70–99)
Potassium: 3.6 mmol/L (ref 3.5–5.1)
Sodium: 139 mmol/L (ref 135–145)
Total Bilirubin: 0.6 mg/dL (ref 0.3–1.2)
Total Protein: 6.7 g/dL (ref 6.5–8.1)

## 2020-09-13 LAB — ANTITHROMBIN III: AntiThromb III Func: 94 % (ref 75–120)

## 2020-09-13 NOTE — Progress Notes (Signed)
Hematology/Oncology Consultation   Name: Terry [name]      MRN: [id number]    Location: Room/bed info not found  Date: 09/13/2020 Time:9:47 AM   REFERRING PHYSICIAN: Ankit C. Reesa Chew, MD  REASON FOR CONSULT: Acute pulmonary embolism without acute cor pulmonale    DIAGNOSIS: Recurrent PE  HISTORY OF PRESENT ILLNESS: Terry [name] is a very pleasant 70 yo caucasian gentleman with recent diagnosis of recurrent PE, no right heart strain noted on ECHO during admission.  He previously had PE last year (August 2021) treated with 3 months of Xarelto.  This admission he was treated with a heparin drip and transitioned to Xarelto.  Bilateral lower extremity US was negative.  He is doing well on Xarelto. No episodes or bleeding. He does bruise easily. No petechiae noted.  He denies injury or infection.  He denies testosterone or other supplementation.  He does not smoke or use recreational drugs.  He has the rare beer or mixed drink socially with dinner.  No history of diabetes or thyroid disease.  No known family history of thrombus.  He states that most of his father family passed away in their 40-50's from heart attacks.  He had L3-L4 fusion surgery on 04/11/2020. This went well.  He had surgery to repair a ruptured right achilles tendon tear. He had some issues with infection at the site and states that he almost lost his leg. Thankfully this was able to eventualy heal.   He has history of prostate cancer with prostatectomy in December 2014. He states that this was successful and he states that he did not require any other intervention.  No known family history of cancer.  He has history of both gall bladder and kidney stone.  No fever, chills, n/v, cough, rash, dizziness, SOB, chest pain, palpitations, abdominal pain or changes in bowel or bladder habits.  No swelling, tenderness, numbness or tingling in his extremities at this time.  No falls or syncope.  He has maintained a good appetite and is  staying well hydrated. His weight is stable.  He stays quite active walking his puppy. He tries to get in 2-3 miles a day.  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   ROS: All other 10 point review of systems is negative.   PAST MEDICAL HISTORY:   Past Medical History:  Diagnosis Date  . Arthritis    neck, back, knees, right ankle  . Bladder calculus   . Chronic back pain   . Complication of anesthesia    dizziness and "crazy amount of bile"  . GERD (gastroesophageal reflux disease)   . History of colon polyps    benign  . History of kidney stones    and bladder stone  . History of prostate cancer urologist-- dr Alinda Money   dx 2014---  s/p  prostatectomy 05-22-2013 ,  Gleason 3+3  . Hypercholesterolemia    takes Crestor daily  . Hyperlipidemia   . Pneumonia 10/2019  . Pulmonary emboli ([place]) 10/2019  . Wears glasses     ALLERGIES: Allergies  Allergen Reactions  . Penicillins Hives, Itching and Other (See Comments)    Has patient had a PCN reaction causing immediate rash, facial/tongue/throat swelling, SOB or lightheadedness with hypotension: No Has patient had a PCN reaction causing SEVERE RASH INVOLVING MUCUS MEMBRANES or SKIN NECROSIS: #  #  #  YES  #  #  #  Has patient had a PCN reaction that required hospitalization No Has patient had a  PCN reaction occurring within the last 10 years: No   . Lipitor [Atorvastatin] Other (See Comments)    Muscle issues      MEDICATIONS:  Current Outpatient Medications on File Prior to Visit  Medication Sig Dispense Refill  . acetaminophen (TYLENOL) 500 MG tablet Take 500 mg by mouth every 6 (six) hours as needed for mild pain.    . cetirizine (ZYRTEC) 10 MG tablet Take 10 mg by mouth daily as needed for allergies.    . Difluprednate 0.05 % EMUL Place 1 drop into the left eye in the morning and at bedtime.    . gabapentin (NEURONTIN) 300 MG capsule Take 300 mg by mouth at bedtime as needed. (Patient not taking: No sig  reported)    . methocarbamol (ROBAXIN) 500 MG tablet Take 1 tablet (500 mg total) by mouth every 6 (six) hours as needed for muscle spasms. 40 tablet 3  . Multiple Vitamin (MULTIVITAMIN) tablet Take 1 tablet by mouth daily.    Marland Kitchen omeprazole (PRILOSEC OTC) 20 MG tablet Take 20 mg by mouth at bedtime.     Marland Kitchen oxyCODONE-acetaminophen (PERCOCET/ROXICET) 5-325 MG tablet Take 1-2 tablets by mouth every 3 (three) hours as needed for moderate pain or severe pain. 60 tablet 0  . RIVAROXABAN (XARELTO) VTE STARTER PACK (15 & 20 MG) Follow package directions: Take one 15mg  tablet by mouth twice a day. On day 22, switch to one 20mg  tablet once a day. Take with food. 51 each 0  . RIVAROXABAN (XARELTO) VTE STARTER PACK (15 & 20 MG) FOLLOW PACKAGE DIRECTIONS: TAKE 1 TABLET (15MG ) BY MOUTH TWICE A DAY. ON DAY 22, SWITCH TO 1 TAQBLET (20MG ) BY MOUTH ONCE A DAY. TAKE WITH FOOD. 51 each 0   No current facility-administered medications on file prior to visit.     PAST SURGICAL HISTORY Past Surgical History:  Procedure Laterality Date  . ACHILLES TENDON SURGERY Right 06-07-2018   @[facility]    reconstruction  . ANTERIOR CERVICAL DECOMP/DISCECTOMY FUSION N/A 06/27/2014   Procedure: ANTERIOR CERVICAL DECOMPRESSION/DISCECTOMY FUSION C5-C7   (2 LEVELS);  Surgeon: Melina Schools, MD;  Location: [facility];  Service: Orthopedics;  Laterality: N/A;  . CARDIAC CATHETERIZATION  2004  . COLONOSCOPY    . CYSTOSCOPY WITH LITHOLAPAXY N/A 03/27/2019   Procedure: CYSTOSCOPY WITH REMOVAL OF BLADDER STONE AND FOREIGN BODY;  Surgeon: Raynelle Bring, MD;  Location[facility];  Service: Urology;  Laterality: N/A;  . EYE SURGERY     lazy eye as child, lasik surgery  . FINGER SURGERY Right    4th finger  . I & D EXTREMITY Right 07/10/2018   Procedure: IRRIGATION AND DEBRIDEMENT RIGHT ANKLE WOUND AND WOUND VAC PLACEMENT;  Surgeon: Wylene Simmer, MD;  Location: [facility];  Service: Orthopedics;  Laterality: Right;  . KNEE ARTHROSCOPY  Bilateral right ?;  left 05-25-2007  @[facility]   . POSTERIOR LAMINECTOMY / DECOMPRESSION LUMBAR SPINE  08-31-2016    dr elsner  @[facility]   . PROSTATE BIOPSY  04/13/13   gleason 3+3=6, vol 55.4 cc  . ROBOT ASSISTED LAPAROSCOPIC RADICAL PROSTATECTOMY N/A 05/22/2013   Procedure: ROBOTIC ASSISTED LAPAROSCOPIC RADICAL PROSTATECTOMY LEVEL 1;  Surgeon: Dutch Gray, MD;  Location: [facility];  Service: Urology;  Laterality: N/A;  . TONSILLECTOMY      FAMILY HISTORY: Family History  Problem Relation Age of Onset  . Pneumonia Mother        bacterial  . Dementia Mother   . Heart disease Father   . Heart attack  Father   . Colon cancer Neg Hx     SOCIAL HISTORY:  reports that he has never smoked. He has never used smokeless tobacco. He reports current alcohol use. He reports that he does not use drugs.  PERFORMANCE STATUS: The patient's performance status is 0 - Asymptomatic  PHYSICAL EXAM: Most Recent Vital Signs: There were no vitals taken for this visit. BP 138/70 (Patient Position: Sitting)   Pulse 67   Temp 98.2 F (36.8 C) (Oral)   Resp 18   Ht 5\' 7"  (1.702 m)   Wt 168 lb (76.2 kg)   SpO2 99%   BMI 26.31 kg/m   General Appearance:    Alert, cooperative, no distress, appears stated age  Head:    Normocephalic, without obvious abnormality, atraumatic  Eyes:    PERRL, conjunctiva/corneas clear, EOM's intact, fundi    benign, both eyes             Throat:   Lips, mucosa, and tongue normal; teeth and gums normal  Neck:   Supple, symmetrical, trachea midline, no adenopathy;       thyroid:  No enlargement/tenderness/nodules; no carotid   bruit or JVD  Back:     Symmetric, no curvature, ROM normal, no CVA tenderness  Lungs:     Clear to auscultation bilaterally, respirations unlabored  Chest wall:    No tenderness or deformity  Heart:    Regular rate and rhythm, S1 and S2 normal, no murmur, rub   or gallop  Abdomen:     Soft, non-tender, bowel sounds active all four quadrants,    no masses, no  organomegaly        Extremities:   Extremities normal, atraumatic, no cyanosis or edema  Pulses:   2+ and symmetric all extremities  Skin:   Skin color, texture, turgor normal, no rashes or lesions  Lymph nodes:   Cervical, supraclavicular, and axillary nodes normal  Neurologic:   CNII-XII intact. Normal strength, sensation and reflexes      throughout    LABORATORY DATA:  Results for orders placed or performed in visit on 09/13/20 (from the past 48 hour(s))  CBC with Differential ([place] Only)     Status: None   Collection Time: 09/13/20  9:10 AM  Result Value Ref Range   WBC Count 8.2 4.0 - 10.5 K/uL   RBC 5.27 4.22 - 5.81 MIL/uL   Hemoglobin 15.2 13.0 - 17.0 g/dL   HCT 45.5 39.0 - 52.0 %   MCV 86.3 80.0 - 100.0 fL   MCH 28.8 26.0 - 34.0 pg   MCHC 33.4 30.0 - 36.0 g/dL   RDW 14.9 11.5 - 15.5 %   Platelet Count 256 150 - 400 K/uL   nRBC 0.0 0.0 - 0.2 %   Neutrophils Relative % 61 %   Neutro Abs 4.9 1.7 - 7.7 K/uL   Lymphocytes Relative 27 %   Lymphs Abs 2.2 0.7 - 4.0 K/uL   Monocytes Relative 7 %   Monocytes Absolute 0.6 0.1 - 1.0 K/uL   Eosinophils Relative 4 %   Eosinophils Absolute 0.3 0.0 - 0.5 K/uL   Basophils Relative 1 %   Basophils Absolute 0.1 0.0 - 0.1 K/uL   Immature Granulocytes 0 %   Abs Immature Granulocytes 0.02 0.00 - 0.07 K/uL    Comment: Performed at [facility] at [facility], [address], [place], [place] [address]      RADIOGRAPHY: No results found.  PATHOLOGY: None  ASSESSMENT/PLAN: Mr. Saxton is a very pleasant 70 yo caucasian gentleman with recent diagnosis of recurrent PE, no right heart strain noted on ECHO during admission.  Hyper coag panel is pending. We will determine if the patient has any underlying reason to clot.  He will continue his same regimen with Xarelto. He is concerned about the cost. Unfortunately, he did not qualify for any financial assistance. We discussed switching him to coumadin  but he would prefer to continue Xarelto at this time. If anything changes he will let us know.  We will go ahead and plan to see him again in 3 months and will repeat his Ct angio that same day to evaluate his response to anticoagulation.   All questions were answered and he is in agreement with the plan. The patient can call the clinic with any problems, questions or concerns. We can certainly see him sooner if needed.   The patient was discussed with Dr. Marin Olp and he is in agreement with the aforementioned.   Laverna Peace, NP

## 2020-09-14 LAB — PROTEIN S, TOTAL: Protein S Ag, Total: 90 % (ref 60–150)

## 2020-09-14 LAB — PROTEIN S ACTIVITY: Protein S Activity: 120 % (ref 63–140)

## 2020-09-14 LAB — DRVVT MIX: dRVVT Mix: 48.5 s — ABNORMAL HIGH (ref 0.0–40.4)

## 2020-09-14 LAB — LUPUS ANTICOAGULANT PANEL
DRVVT: 62.5 s — ABNORMAL HIGH (ref 0.0–47.0)
PTT Lupus Anticoagulant: 37.2 s (ref 0.0–51.9)

## 2020-09-14 LAB — PROTEIN C ACTIVITY: Protein C Activity: 116 % (ref 73–180)

## 2020-09-14 LAB — PROTEIN C, TOTAL: Protein C, Total: 99 % (ref 60–150)

## 2020-09-14 LAB — DRVVT CONFIRM: dRVVT Confirm: 1.4 ratio — ABNORMAL HIGH (ref 0.8–1.2)

## 2020-09-16 ENCOUNTER — Telehealth: Payer: Self-pay | Admitting: *Deleted

## 2020-09-16 LAB — BETA-2-GLYCOPROTEIN I ABS, IGG/M/A
Beta-2 Glyco I IgG: <9 GPI IgG units (ref 0–20)
Beta-2-Glycoprotein I IgA: <9 GPI IgA units (ref 0–25)
Beta-2-Glycoprotein I IgM: <9 GPI IgM units (ref 0–32)

## 2020-09-16 LAB — HOMOCYSTEINE: Homocysteine: 16.6 umol/L (ref 0.0–19.2)

## 2020-09-16 NOTE — Telephone Encounter (Signed)
Per 09/13/20 los called patient and gave upcoming appointments - mailed calendar

## 2020-09-17 ENCOUNTER — Encounter: Payer: Medicare Other | Admitting: Internal Medicine

## 2020-09-17 LAB — CARDIOLIPIN ANTIBODIES, IGG, IGM, IGA
Anticardiolipin IgA: <9 APL U/mL (ref 0–11)
Anticardiolipin IgG: <9 GPL U/mL (ref 0–14)
Anticardiolipin IgM: <9 MPL U/mL (ref 0–12)

## 2020-09-18 LAB — PROTHROMBIN GENE MUTATION

## 2020-09-19 LAB — FACTOR 5 LEIDEN

## 2020-09-20 ENCOUNTER — Telehealth: Payer: Self-pay | Admitting: Family

## 2020-09-20 NOTE — Telephone Encounter (Signed)
I was able to speak with the patient regarding recent lab work and positive lupus anticoagulant. He will continue his same regimen with Xarelto. Follow-up with repeat CT angio in July. No other questions at this time. Patient appreciative of call.

## 2020-10-15 ENCOUNTER — Telehealth: Payer: Self-pay

## 2020-10-15 NOTE — Telephone Encounter (Signed)
S/w pt and r/s his appt due to contrast shortage, message sent in teams to r/s scan, pt aware that I will mail a calendar when appts complete   Jayliana Valencia

## 2020-10-18 DIAGNOSIS — I10 Essential (primary) hypertension: Secondary | ICD-10-CM | POA: Diagnosis not present

## 2020-10-18 DIAGNOSIS — M5136 Other intervertebral disc degeneration, lumbar region: Secondary | ICD-10-CM | POA: Diagnosis not present

## 2020-10-24 DIAGNOSIS — H35351 Cystoid macular degeneration, right eye: Secondary | ICD-10-CM | POA: Diagnosis not present

## 2020-11-12 DIAGNOSIS — M5116 Intervertebral disc disorders with radiculopathy, lumbar region: Secondary | ICD-10-CM | POA: Diagnosis not present

## 2020-11-12 DIAGNOSIS — M5416 Radiculopathy, lumbar region: Secondary | ICD-10-CM | POA: Diagnosis not present

## 2020-11-12 DIAGNOSIS — Z981 Arthrodesis status: Secondary | ICD-10-CM | POA: Diagnosis not present

## 2020-11-17 IMAGING — RF DG LUMBAR SPINE 2-3V
1 series · 2 of 2 positions shown · non-contrast
Comparison: None.

CLINICAL DATA: 69-year-old male from lumbar surgery

EXAM:
LUMBAR SPINE - 2-3 VIEW; DG C-ARM 1-60 MIN

[Series 1: run · 2 of 2 slices shown]
[im 1/2]
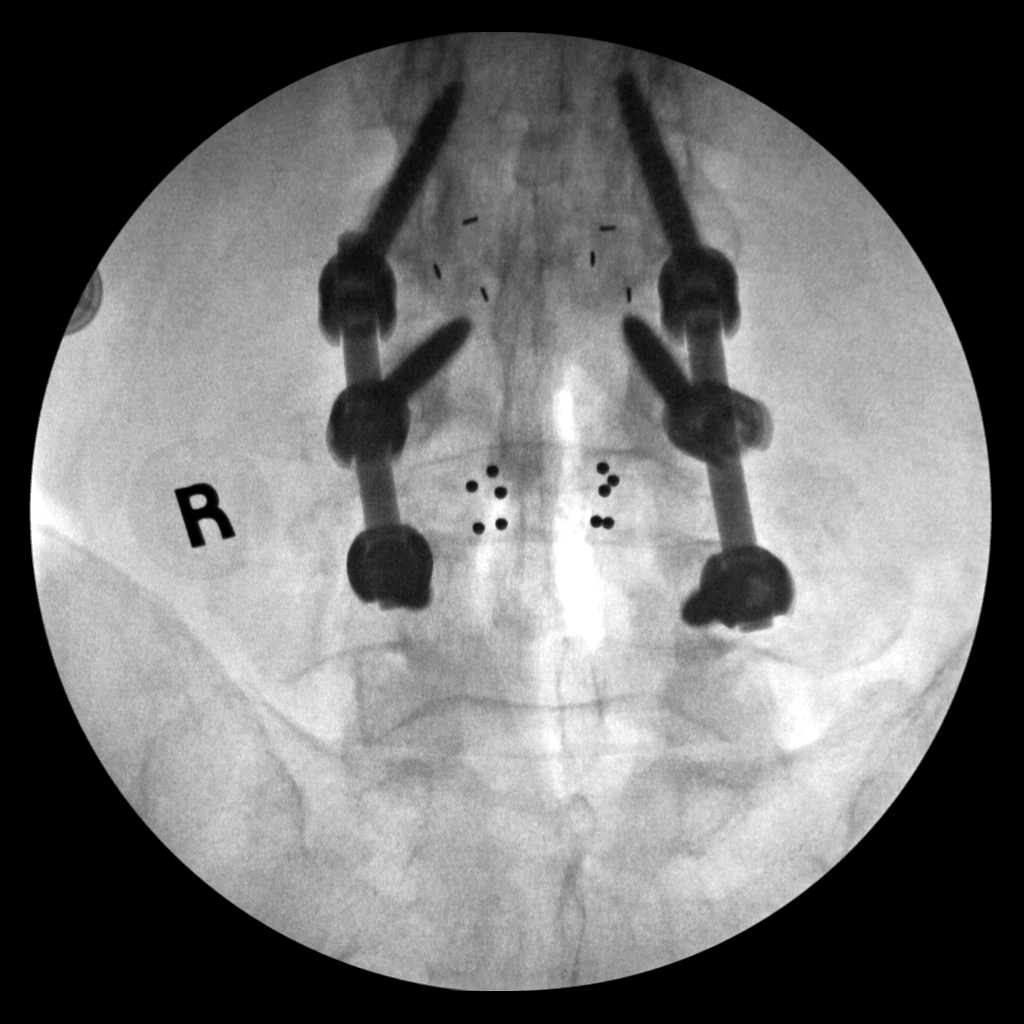
[im 2/2]
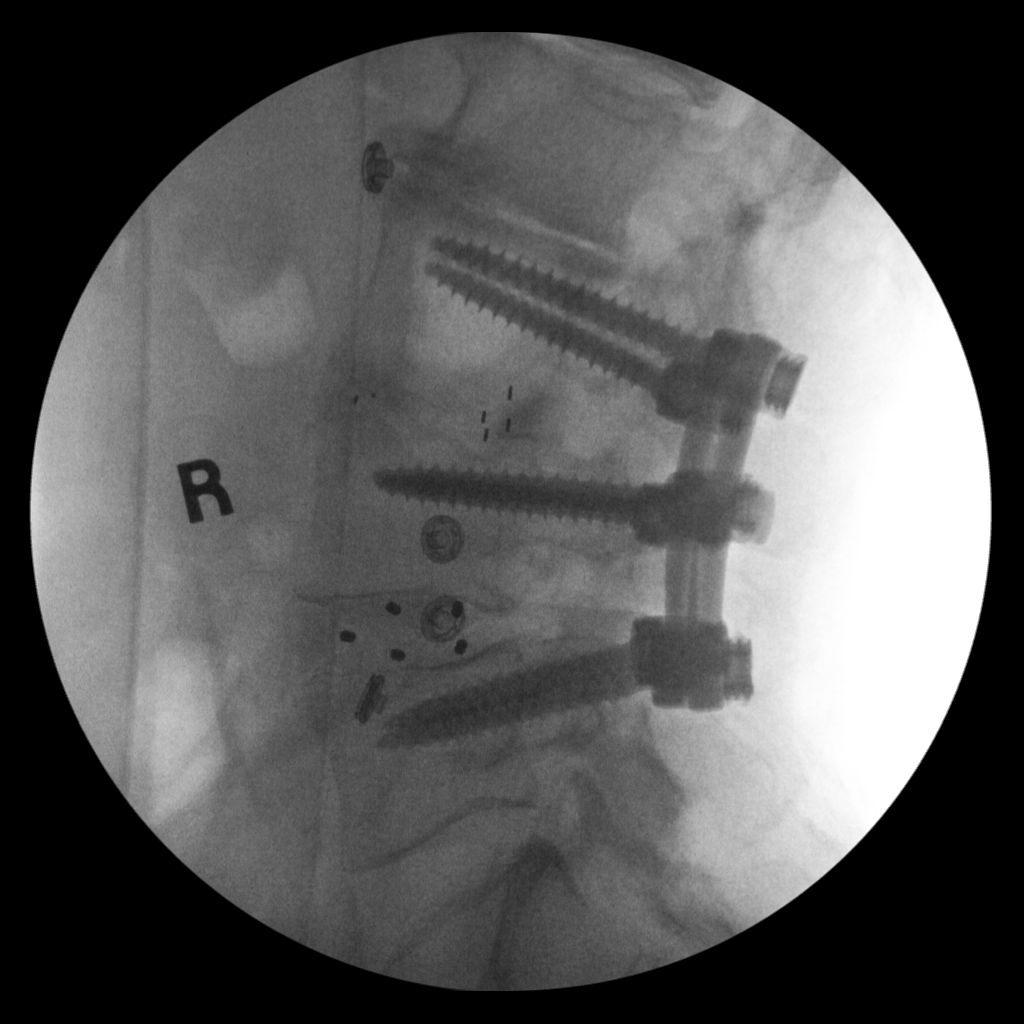

[2 of 2 positions shown; findings below may reference images not displayed]

FINDINGS: Intraoperative fluoroscopic spot images demonstrate surgical changes
L4-L5 extension of the prior posterior lumbar interbody fusion at
L3-L4.

Disc spacer at the L4-L5 level centered.

Bilateral pedicle screw and rod fixation now spans L3-S5, with no
hardware fracture or complicating feature.

Restoration of alignment at the L4-L5 level.
IMPRESSION: Intraoperative fluoroscopic spot images demonstrating surgical
changes of the L3-L5 level with no complicating features, as above.
Please refer to the dictated operative report for full details of
intraoperative findings and procedure.

## 2020-11-17 IMAGING — RF DG C-ARM 1-60 MIN
1 series · 2 of 2 positions shown · non-contrast
Comparison: None.

CLINICAL DATA: 69-year-old male from lumbar surgery

EXAM:
LUMBAR SPINE - 2-3 VIEW; DG C-ARM 1-60 MIN

[Series 1: run · 2 of 2 slices shown]
[im 1/2]
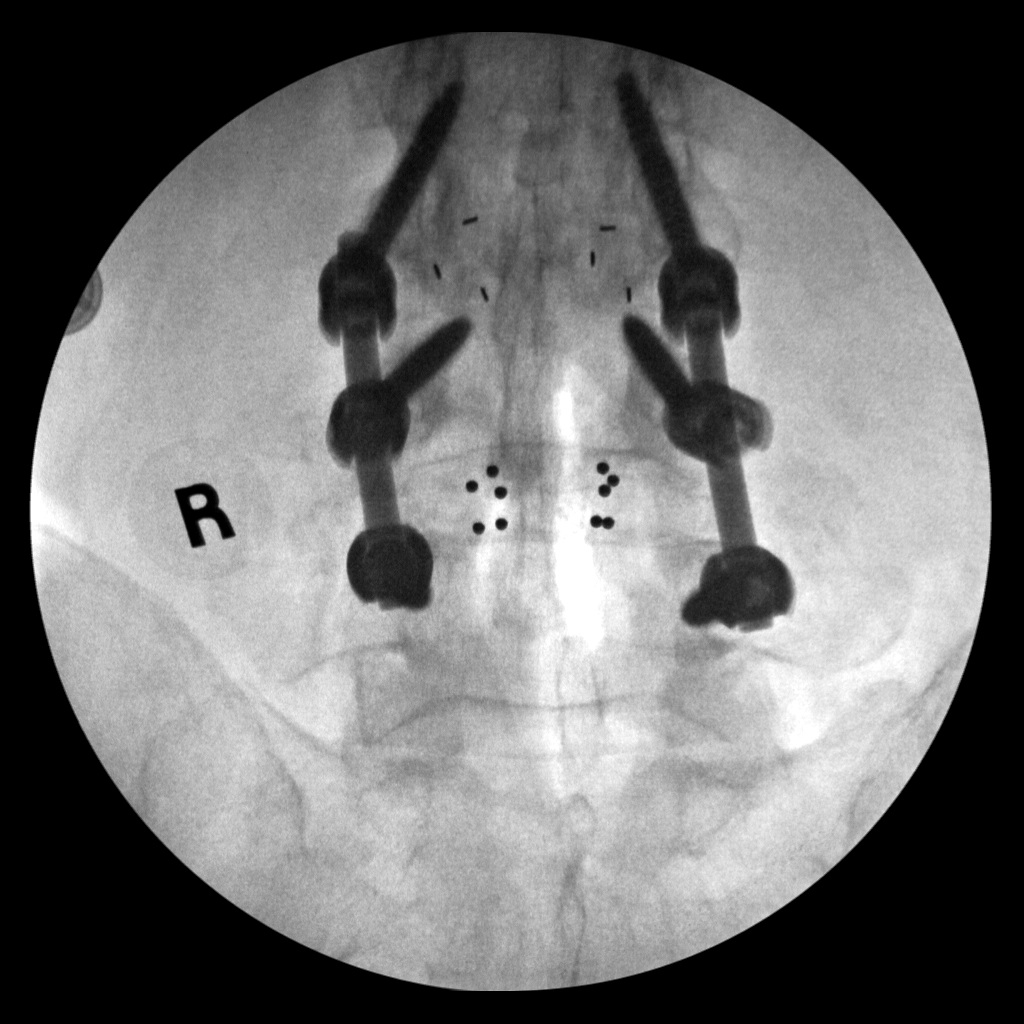
[im 2/2]
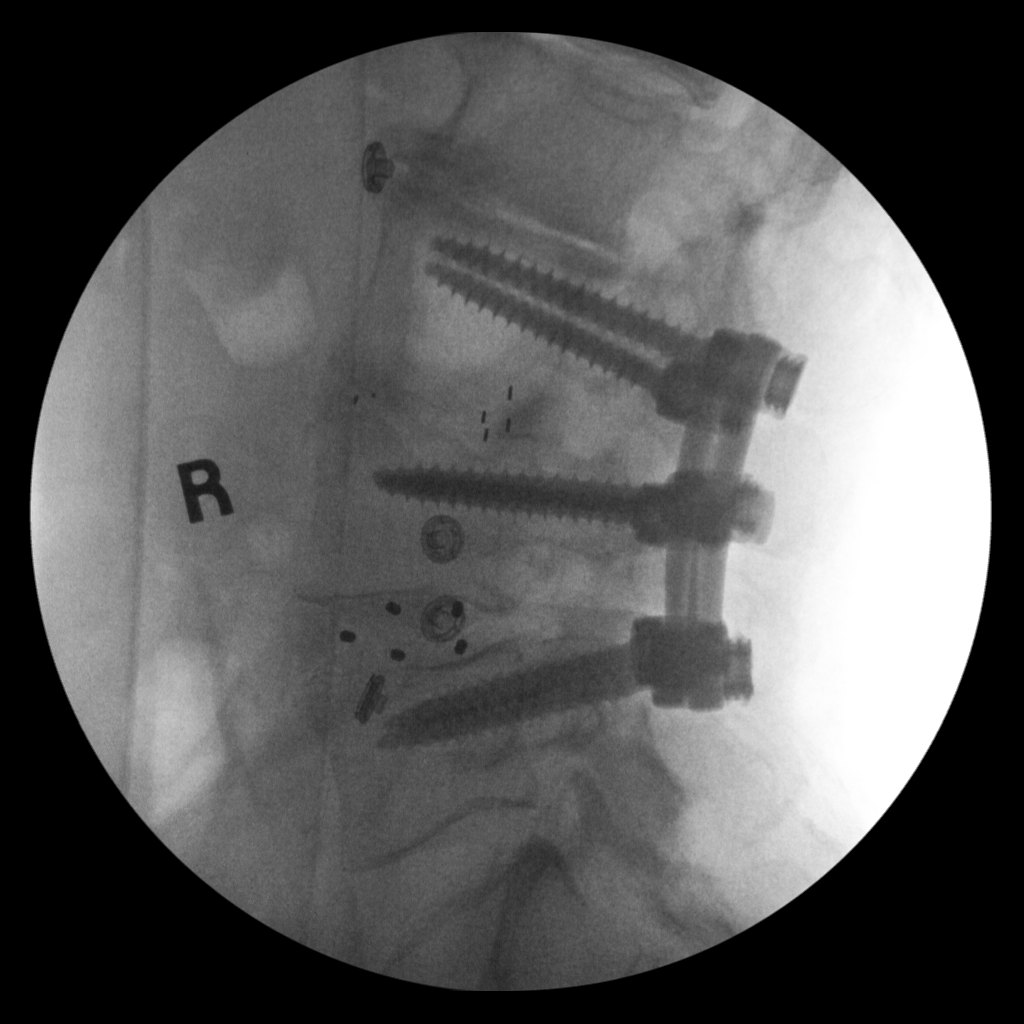

[2 of 2 positions shown; findings below may reference images not displayed]

FINDINGS: Intraoperative fluoroscopic spot images demonstrate surgical changes
L4-L5 extension of the prior posterior lumbar interbody fusion at
L3-L4.

Disc spacer at the L4-L5 level centered.

Bilateral pedicle screw and rod fixation now spans L3-S5, with no
hardware fracture or complicating feature.

Restoration of alignment at the L4-L5 level.
IMPRESSION: Intraoperative fluoroscopic spot images demonstrating surgical
changes of the L3-L5 level with no complicating features, as above.
Please refer to the dictated operative report for full details of
intraoperative findings and procedure.

## 2020-12-13 ENCOUNTER — Ambulatory Visit: Payer: Medicare Other | Admitting: Family

## 2020-12-13 ENCOUNTER — Other Ambulatory Visit (HOSPITAL_BASED_OUTPATIENT_CLINIC_OR_DEPARTMENT_OTHER): Payer: Medicare Other

## 2020-12-13 ENCOUNTER — Other Ambulatory Visit: Payer: Medicare Other

## 2020-12-22 DIAGNOSIS — S50362A Insect bite (nonvenomous) of left elbow, initial encounter: Secondary | ICD-10-CM | POA: Diagnosis not present

## 2020-12-22 DIAGNOSIS — W57XXXA Bitten or stung by nonvenomous insect and other nonvenomous arthropods, initial encounter: Secondary | ICD-10-CM | POA: Diagnosis not present

## 2020-12-22 DIAGNOSIS — R030 Elevated blood-pressure reading, without diagnosis of hypertension: Secondary | ICD-10-CM | POA: Diagnosis not present

## 2021-01-03 ENCOUNTER — Ambulatory Visit (HOSPITAL_BASED_OUTPATIENT_CLINIC_OR_DEPARTMENT_OTHER)
Admission: RE | Admit: 2021-01-03 | Discharge: 2021-01-03 | Disposition: A | Discharge status: Home / Self Care | Payer: Medicare Other | Source: Ambulatory Visit | Attending: Family | Admitting: Family

## 2021-01-03 ENCOUNTER — Encounter: Payer: Self-pay | Admitting: Family

## 2021-01-03 ENCOUNTER — Other Ambulatory Visit: Payer: Self-pay

## 2021-01-03 ENCOUNTER — Inpatient Hospital Stay (HOSPITAL_BASED_OUTPATIENT_CLINIC_OR_DEPARTMENT_OTHER): Payer: Medicare Other | Admitting: Family

## 2021-01-03 ENCOUNTER — Other Ambulatory Visit: Payer: Self-pay | Admitting: Family

## 2021-01-03 ENCOUNTER — Inpatient Hospital Stay: Payer: Medicare Other | Attending: Family

## 2021-01-03 ENCOUNTER — Encounter (HOSPITAL_BASED_OUTPATIENT_CLINIC_OR_DEPARTMENT_OTHER): Payer: Self-pay

## 2021-01-03 ENCOUNTER — Telehealth: Payer: Self-pay

## 2021-01-03 ENCOUNTER — Other Ambulatory Visit (HOSPITAL_BASED_OUTPATIENT_CLINIC_OR_DEPARTMENT_OTHER): Payer: Medicare Other

## 2021-01-03 VITALS — BP 155/80 | HR 65 | Temp 98.6°F | Resp 18 | Wt 168.0 lb

## 2021-01-03 DIAGNOSIS — R079 Chest pain, unspecified: Secondary | ICD-10-CM | POA: Insufficient documentation

## 2021-01-03 DIAGNOSIS — I2609 Other pulmonary embolism with acute cor pulmonale: Secondary | ICD-10-CM

## 2021-01-03 DIAGNOSIS — D6862 Lupus anticoagulant syndrome: Secondary | ICD-10-CM

## 2021-01-03 DIAGNOSIS — Z7901 Long term (current) use of anticoagulants: Secondary | ICD-10-CM | POA: Insufficient documentation

## 2021-01-03 DIAGNOSIS — J9811 Atelectasis: Secondary | ICD-10-CM | POA: Diagnosis not present

## 2021-01-03 DIAGNOSIS — I2699 Other pulmonary embolism without acute cor pulmonale: Secondary | ICD-10-CM | POA: Insufficient documentation

## 2021-01-03 LAB — CMP (CANCER CENTER ONLY)
ALT: 37 U/L (ref 0–44)
AST: 20 U/L (ref 15–41)
Albumin: 4.1 g/dL (ref 3.5–5.0)
Alkaline Phosphatase: 59 U/L (ref 38–126)
Anion gap: 7 (ref 5–15)
BUN: 12 mg/dL (ref 8–23)
CO2: 31 mmol/L (ref 22–32)
Calcium: 9.7 mg/dL (ref 8.9–10.3)
Chloride: 103 mmol/L (ref 98–111)
Creatinine: 0.97 mg/dL (ref 0.61–1.24)
GFR, Estimated: >60 mL/min (ref >60)
Glucose, Bld: 104 mg/dL — ABNORMAL HIGH (ref 70–99)
Potassium: 4.1 mmol/L (ref 3.5–5.1)
Sodium: 141 mmol/L (ref 135–145)
Total Bilirubin: 0.8 mg/dL (ref 0.3–1.2)
Total Protein: 6.5 g/dL (ref 6.5–8.1)

## 2021-01-03 LAB — CBC WITH DIFFERENTIAL (CANCER CENTER ONLY)
Abs Immature Granulocytes: 0.05 10*3/uL (ref 0.00–0.07)
Basophils Absolute: 0.1 10*3/uL (ref 0.0–0.1)
Basophils Relative: 1 %
Eosinophils Absolute: 0.2 10*3/uL (ref 0.0–0.5)
Eosinophils Relative: 2 %
HCT: 48 % (ref 39.0–52.0)
Hemoglobin: 16.4 g/dL (ref 13.0–17.0)
Immature Granulocytes: 1 %
Lymphocytes Relative: 25 %
Lymphs Abs: 2.5 10*3/uL (ref 0.7–4.0)
MCH: 29.8 pg (ref 26.0–34.0)
MCHC: 34.2 g/dL (ref 30.0–36.0)
MCV: 87.3 fL (ref 80.0–100.0)
Monocytes Absolute: 0.8 10*3/uL (ref 0.1–1.0)
Monocytes Relative: 8 %
Neutro Abs: 6.3 10*3/uL (ref 1.7–7.7)
Neutrophils Relative %: 63 %
Platelet Count: 211 10*3/uL (ref 150–400)
RBC: 5.5 MIL/uL (ref 4.22–5.81)
RDW: 13.8 % (ref 11.5–15.5)
WBC Count: 9.9 10*3/uL (ref 4.0–10.5)
nRBC: 0 % (ref 0.0–0.2)

## 2021-01-03 LAB — D-DIMER, QUANTITATIVE: D-Dimer, Quant: 0.48 ug/mL-FEU (ref 0.00–0.50)

## 2021-01-03 MED ORDER — IOHEXOL 350 MG/ML SOLN
100.0000 mL | Freq: Once | INTRAVENOUS | Status: AC | PRN
  Administered 2021-01-03: 75 mL via INTRAVENOUS

## 2021-01-03 NOTE — Telephone Encounter (Signed)
-----   Message from Eliezer Bottom, NP sent at 01/03/2021 12:09 PM EDT ----- PE has resolved!!! WOOP WOOP!!!! We'll see him in 6 months :)  Sarah  ----- Message ----- From: Interface, Rad Results In Sent: 01/03/2021  11:36 AM EDT To: Eliezer Bottom, NP

## 2021-01-03 NOTE — Telephone Encounter (Signed)
Called and informed patient of lab results, patient verbalized understanding and denies any questions or concerns at this time.   

## 2021-01-03 NOTE — Progress Notes (Signed)
Hematology and Oncology Follow Up Visit  JAHMON CREDIT [id number] 1950/08/11 70 y.o. 01/03/2021   Principle Diagnosis:  Recurrent pulmonary embolism Positive Lupus anticoagulant  Current Therapy:   Xarelto 20 mg PO daily   Interim History:  Terry [name] is here today for follow-up. He is doing well. He is taking his Xarelto daily as prescribed and has had no bleeding or petechiae. He does bruise easily on his arms.  D-dimer today 0.48 He did have a fall while trying to help his neighbor not to fall. Thankfully he was not seriously injured.  No fever, chills, n/v, cough, rash, dizziness, SOB, chest pain, palpitations, abdominal pain or changes in bowel or bladder habits.  No swelling, tenderness, numbness or tingling in her extremities.  No syncope.  He has maintained a good appetite and is staying well hydrated. His weight is stable at 168 lbs.   ECOG Performance Status: 1 - Symptomatic but completely ambulatory  Medications:  Allergies as of 01/03/2021       Reactions   Penicillins Hives, Itching, Other (See Comments)   Has patient had a PCN reaction causing immediate rash, facial/tongue/throat swelling, SOB or lightheadedness with hypotension: No Has patient had a PCN reaction causing SEVERE RASH INVOLVING MUCUS MEMBRANES or SKIN NECROSIS: #  #  #  YES  #  #  #  Has patient had a PCN reaction that required hospitalization No Has patient had a PCN reaction occurring within the last 10 years: No   Lipitor [atorvastatin] Other (See Comments)   Muscle issues        Medication List        Accurate as of January 03, 2021 11:20 AM. If you have any questions, ask your nurse or doctor.          acetaminophen 500 MG tablet Commonly known as: TYLENOL Take 500 mg by mouth every 6 (six) hours as needed for mild pain.   CENTRUM ADULTS PO take (1) one tab po daily   cetirizine 10 MG tablet Commonly known as: ZYRTEC Take 10 mg by mouth daily as needed for allergies.   [facility]  Melatonin 10 MG Caps Generic drug: Melatonin Take 1 capsule po q hs   Difluprednate 0.05 % Emul Place 1 drop into the left eye in the morning and at bedtime.   gabapentin 300 MG capsule Commonly known as: NEURONTIN Take 300 mg by mouth at bedtime as needed.   methocarbamol 500 MG tablet Commonly known as: ROBAXIN Take 1 tablet (500 mg total) by mouth every 6 (six) hours as needed for muscle spasms.   multivitamin tablet Take 1 tablet by mouth daily.   omeprazole 20 MG tablet Commonly known as: PRILOSEC OTC Take 20 mg by mouth at bedtime.   oxyCODONE-acetaminophen 5-325 MG tablet Commonly known as: PERCOCET/ROXICET Take 1-2 tablets by mouth every 3 (three) hours as needed for moderate pain or severe pain.   Restasis 0.05 % ophthalmic emulsion Generic drug: cycloSPORINE   Rivaroxaban Stater Pack (15 mg and 20 mg) Commonly known as: XARELTO STARTER PACK Follow package directions: Take one '15mg'$  tablet by mouth twice a day. On day 22, switch to one '20mg'$  tablet once a day. Take with food.   Xarelto Starter Pack Generic drug: Rivaroxaban Stater Pack (15 mg and 20 mg) FOLLOW PACKAGE DIRECTIONS: TAKE 1 TABLET ('15MG'$ ) BY MOUTH TWICE A DAY. ON DAY 22, SWITCH TO 1 TAQBLET ('20MG'$ ) BY MOUTH ONCE A DAY. TAKE WITH FOOD.   Voltaren 1 % Gel  Generic drug: diclofenac Sodium Apply topically See admin instructions.        Allergies:  Allergies  Allergen Reactions   Penicillins Hives, Itching and Other (See Comments)    Has patient had a PCN reaction causing immediate rash, facial/tongue/throat swelling, SOB or lightheadedness with hypotension: No Has patient had a PCN reaction causing SEVERE RASH INVOLVING MUCUS MEMBRANES or SKIN NECROSIS: #  #  #  YES  #  #  #  Has patient had a PCN reaction that required hospitalization No Has patient had a PCN reaction occurring within the last 10 years: No    Lipitor [Atorvastatin] Other (See Comments)    Muscle issues    Past Medical History,  Surgical history, Social history, and Family History were reviewed and updated.  Review of Systems: All other 10 point review of systems is negative.   Physical Exam:  vitals were not taken for this visit.   Wt Readings from Last 3 Encounters:  09/13/20 168 lb (76.2 kg)  08/12/20 163 lb (73.9 kg)  06/21/20 168 lb (76.2 kg)    Ocular: Sclerae unicteric, pupils equal, round and reactive to light Ear-nose-throat: Oropharynx clear, dentition fair Lymphatic: No cervical or supraclavicular adenopathy Lungs no rales or rhonchi, good excursion bilaterally Heart regular rate and rhythm, no murmur appreciated Abd soft, nontender, positive bowel sounds MSK no focal spinal tenderness, no joint edema Neuro: non-focal, well-oriented, appropriate affect Breasts: Deferred    Lab Results  Component Value Date   WBC 9.9 01/03/2021   HGB 16.4 01/03/2021   HCT 48.0 01/03/2021   MCV 87.3 01/03/2021   PLT 211 01/03/2021   No results found for: FERRITIN, IRON, TIBC, UIBC, IRONPCTSAT Lab Results  Component Value Date   RBC 5.50 01/03/2021   No results found for: KPAFRELGTCHN, LAMBDASER, KAPLAMBRATIO No results found for: IGGSERUM, IGA, IGMSERUM No results found for: Odetta Pink, SPEI   Chemistry      Component Value Date/Time   NA 141 01/03/2021 0936   K 4.1 01/03/2021 0936   CL 103 01/03/2021 0936   CO2 31 01/03/2021 0936   BUN 12 01/03/2021 0936   CREATININE 0.97 01/03/2021 0936      Component Value Date/Time   CALCIUM 9.7 01/03/2021 0936   ALKPHOS 59 01/03/2021 0936   AST 20 01/03/2021 0936   ALT 37 01/03/2021 0936   BILITOT 0.8 01/03/2021 0936       Impression and Plan: Terry [name] is a very pleasant 70 yo caucasian gentleman with history of recurrent PE and positive lupus anticoagulant.  Repeat CT angio today showed resolution of PE.  He will remain on full dose anticoagulation long term.  Follow-up in 6 months.  He  can contact our office with any questions or concerns.   Laverna Peace, NP 8/5/202211:20 AM

## 2021-01-04 LAB — DRVVT CONFIRM: dRVVT Confirm: 2.1 ratio — ABNORMAL HIGH (ref 0.8–1.2)

## 2021-01-04 LAB — LUPUS ANTICOAGULANT PANEL
DRVVT: 142.6 s — ABNORMAL HIGH (ref 0.0–47.0)
PTT Lupus Anticoagulant: 43.9 s (ref 0.0–51.9)

## 2021-01-04 LAB — DRVVT MIX: dRVVT Mix: 89.7 s — ABNORMAL HIGH (ref 0.0–40.4)

## 2021-02-26 DIAGNOSIS — M5136 Other intervertebral disc degeneration, lumbar region: Secondary | ICD-10-CM | POA: Diagnosis not present

## 2021-02-26 DIAGNOSIS — M4316 Spondylolisthesis, lumbar region: Secondary | ICD-10-CM | POA: Diagnosis not present

## 2021-02-26 DIAGNOSIS — M5023 Other cervical disc displacement, cervicothoracic region: Secondary | ICD-10-CM | POA: Diagnosis not present

## 2021-02-26 DIAGNOSIS — Z6825 Body mass index (BMI) 25.0-25.9, adult: Secondary | ICD-10-CM | POA: Diagnosis not present

## 2021-02-26 DIAGNOSIS — I10 Essential (primary) hypertension: Secondary | ICD-10-CM | POA: Diagnosis not present

## 2021-03-06 DIAGNOSIS — H5053 Vertical heterophoria: Secondary | ICD-10-CM | POA: Diagnosis not present

## 2021-03-06 DIAGNOSIS — Z125 Encounter for screening for malignant neoplasm of prostate: Secondary | ICD-10-CM | POA: Diagnosis not present

## 2021-03-06 DIAGNOSIS — H5051 Esophoria: Secondary | ICD-10-CM | POA: Diagnosis not present

## 2021-03-06 DIAGNOSIS — H532 Diplopia: Secondary | ICD-10-CM | POA: Diagnosis not present

## 2021-03-06 DIAGNOSIS — R739 Hyperglycemia, unspecified: Secondary | ICD-10-CM | POA: Diagnosis not present

## 2021-03-06 DIAGNOSIS — E785 Hyperlipidemia, unspecified: Secondary | ICD-10-CM | POA: Diagnosis not present

## 2021-03-10 DIAGNOSIS — M5416 Radiculopathy, lumbar region: Secondary | ICD-10-CM | POA: Diagnosis not present

## 2021-03-10 DIAGNOSIS — M5116 Intervertebral disc disorders with radiculopathy, lumbar region: Secondary | ICD-10-CM | POA: Diagnosis not present

## 2021-03-13 DIAGNOSIS — D692 Other nonthrombocytopenic purpura: Secondary | ICD-10-CM | POA: Diagnosis not present

## 2021-03-13 DIAGNOSIS — Z0000 Encounter for general adult medical examination without abnormal findings: Secondary | ICD-10-CM | POA: Diagnosis not present

## 2021-03-13 DIAGNOSIS — I8290 Acute embolism and thrombosis of unspecified vein: Secondary | ICD-10-CM | POA: Diagnosis not present

## 2021-03-13 DIAGNOSIS — M25571 Pain in right ankle and joints of right foot: Secondary | ICD-10-CM | POA: Diagnosis not present

## 2021-03-13 DIAGNOSIS — R739 Hyperglycemia, unspecified: Secondary | ICD-10-CM | POA: Diagnosis not present

## 2021-03-13 DIAGNOSIS — Z86711 Personal history of pulmonary embolism: Secondary | ICD-10-CM | POA: Diagnosis not present

## 2021-03-13 DIAGNOSIS — E785 Hyperlipidemia, unspecified: Secondary | ICD-10-CM | POA: Diagnosis not present

## 2021-03-13 DIAGNOSIS — C61 Malignant neoplasm of prostate: Secondary | ICD-10-CM | POA: Diagnosis not present

## 2021-03-13 DIAGNOSIS — Z7901 Long term (current) use of anticoagulants: Secondary | ICD-10-CM | POA: Diagnosis not present

## 2021-03-13 DIAGNOSIS — R7401 Elevation of levels of liver transaminase levels: Secondary | ICD-10-CM | POA: Diagnosis not present

## 2021-03-13 DIAGNOSIS — M1990 Unspecified osteoarthritis, unspecified site: Secondary | ICD-10-CM | POA: Diagnosis not present

## 2021-03-13 DIAGNOSIS — R030 Elevated blood-pressure reading, without diagnosis of hypertension: Secondary | ICD-10-CM | POA: Diagnosis not present

## 2021-04-09 DIAGNOSIS — Z23 Encounter for immunization: Secondary | ICD-10-CM | POA: Diagnosis not present

## 2021-07-07 ENCOUNTER — Inpatient Hospital Stay (HOSPITAL_BASED_OUTPATIENT_CLINIC_OR_DEPARTMENT_OTHER): Payer: Medicare Other | Admitting: Family

## 2021-07-07 ENCOUNTER — Encounter: Payer: Self-pay | Admitting: Family

## 2021-07-07 ENCOUNTER — Inpatient Hospital Stay: Payer: Medicare Other | Attending: Hematology & Oncology

## 2021-07-07 ENCOUNTER — Other Ambulatory Visit: Payer: Self-pay

## 2021-07-07 VITALS — BP 127/76 | HR 82 | Temp 98.2°F | Resp 18 | Ht 67.0 in | Wt 171.1 lb

## 2021-07-07 DIAGNOSIS — D6862 Lupus anticoagulant syndrome: Secondary | ICD-10-CM

## 2021-07-07 DIAGNOSIS — I2609 Other pulmonary embolism with acute cor pulmonale: Secondary | ICD-10-CM | POA: Diagnosis not present

## 2021-07-07 DIAGNOSIS — Z7901 Long term (current) use of anticoagulants: Secondary | ICD-10-CM | POA: Insufficient documentation

## 2021-07-07 DIAGNOSIS — I2699 Other pulmonary embolism without acute cor pulmonale: Secondary | ICD-10-CM | POA: Insufficient documentation

## 2021-07-07 LAB — CMP (CANCER CENTER ONLY)
ALT: 25 U/L (ref 0–44)
AST: 24 U/L (ref 15–41)
Albumin: 4.3 g/dL (ref 3.5–5.0)
Alkaline Phosphatase: 68 U/L (ref 38–126)
Anion gap: 9 (ref 5–15)
BUN: 13 mg/dL (ref 8–23)
CO2: 28 mmol/L (ref 22–32)
Calcium: 9.9 mg/dL (ref 8.9–10.3)
Chloride: 100 mmol/L (ref 98–111)
Creatinine: 1.02 mg/dL (ref 0.61–1.24)
GFR, Estimated: >60 mL/min (ref >60)
Glucose, Bld: 102 mg/dL — ABNORMAL HIGH (ref 70–99)
Potassium: 4.2 mmol/L (ref 3.5–5.1)
Sodium: 137 mmol/L (ref 135–145)
Total Bilirubin: 0.8 mg/dL (ref 0.3–1.2)
Total Protein: 6.6 g/dL (ref 6.5–8.1)

## 2021-07-07 LAB — CBC WITH DIFFERENTIAL (CANCER CENTER ONLY)
Abs Immature Granulocytes: 0.03 10*3/uL (ref 0.00–0.07)
Basophils Absolute: 0.1 10*3/uL (ref 0.0–0.1)
Basophils Relative: 1 %
Eosinophils Absolute: 0.2 10*3/uL (ref 0.0–0.5)
Eosinophils Relative: 2 %
HCT: 45.6 % (ref 39.0–52.0)
Hemoglobin: 15.6 g/dL (ref 13.0–17.0)
Immature Granulocytes: 0 %
Lymphocytes Relative: 26 %
Lymphs Abs: 2.4 10*3/uL (ref 0.7–4.0)
MCH: 30.4 pg (ref 26.0–34.0)
MCHC: 34.2 g/dL (ref 30.0–36.0)
MCV: 88.7 fL (ref 80.0–100.0)
Monocytes Absolute: 0.8 10*3/uL (ref 0.1–1.0)
Monocytes Relative: 9 %
Neutro Abs: 5.9 10*3/uL (ref 1.7–7.7)
Neutrophils Relative %: 62 %
Platelet Count: 241 10*3/uL (ref 150–400)
RBC: 5.14 MIL/uL (ref 4.22–5.81)
RDW: 12.8 % (ref 11.5–15.5)
WBC Count: 9.4 10*3/uL (ref 4.0–10.5)
nRBC: 0 % (ref 0.0–0.2)

## 2021-07-07 LAB — D-DIMER, QUANTITATIVE: D-Dimer, Quant: 0.33 ug/mL-FEU (ref 0.00–0.50)

## 2021-07-07 NOTE — Progress Notes (Signed)
Hematology and Oncology Follow Up Visit  Terry [name] [id number] 1951-04-10 70 y.o. 07/07/2021   Principle Diagnosis:  Recurrent pulmonary embolism Positive Lupus anticoagulant   Current Therapy:        Xarelto 20 mg PO daily - lifelong                Interim History:  Terry [name] is here today for follow-up. He is doing fairly well. He has chronic back issues effecting his mobility. Thankfully he denies falls or syncope.  The neuropathy and mild swelling in his right lower extremity is unchanged from baseline. Pedal pulses are 2+.  No fever, chills, n/v, cough, rash, dizziness,SOB, chest pain, palpitations, abdominal pain or changes in bowel or bladder habits.  He states that he started Aricept a month ago for memory and he feels that this has helped.  No blood loss noted. No abnormal bruising, no petechiae.  D-dimer is 0.33.  He has a good appetite and is doing his best to stay well hydrated. His weight is stable at 171 lbs.   ECOG Performance Status: 1 - Symptomatic but completely ambulatory  Medications:  Allergies as of 07/07/2021       Reactions   Lipitor [atorvastatin] Other (See Comments)   Muscle issues   Penicillins Hives, Itching, Other (See Comments)   Has patient had a PCN reaction causing immediate rash, facial/tongue/throat swelling, SOB or lightheadedness with hypotension: No Has patient had a PCN reaction causing SEVERE RASH INVOLVING MUCUS MEMBRANES or SKIN NECROSIS: #  #  #  YES  #  #  #  Has patient had a PCN reaction that required hospitalization No Has patient had a PCN reaction occurring within the last 10 years: No        Medication List        Accurate as of July 07, 2021  2:28 PM. If you have any questions, ask your nurse or doctor.          STOP taking these medications    multivitamin tablet Stopped by: Terry Dawson, NP   oxyCODONE-acetaminophen 5-325 MG tablet Commonly known as: PERCOCET/ROXICET Stopped by: Terry Dawson, NP        TAKE these medications    acetaminophen 500 MG tablet Commonly known as: TYLENOL Take 500 mg by mouth every 6 (six) hours as needed for mild pain.   CENTRUM ADULTS PO take (1) one tab po daily   cetirizine 10 MG tablet Commonly known as: ZYRTEC Take 10 mg by mouth daily as needed for allergies.   cycloSPORINE 0.05 % ophthalmic emulsion Commonly known as: RESTASIS   diclofenac Sodium 1 % Gel Commonly known as: VOLTAREN Apply topically See admin instructions.   Difluprednate 0.05 % Emul Place 1 drop into the left eye in the morning and at bedtime.   diphenhydrAMINE 25 MG tablet Commonly known as: BENADRYL Take 25 mg by mouth every 6 (six) hours as needed.   donepezil 5 MG tablet Commonly known as: ARICEPT Take 5 mg by mouth at bedtime.   gabapentin 300 MG capsule Commonly known as: NEURONTIN Take 300 mg by mouth at bedtime as needed.   Melatonin 10 MG Caps Take 1 capsule po q hs   methocarbamol 500 MG tablet Commonly known as: ROBAXIN Take 1 tablet (500 mg total) by mouth every 6 (six) hours as needed for muscle spasms.   omeprazole 20 MG tablet Commonly known as: PRILOSEC OTC Take 20 mg by mouth at bedtime.   rivaroxaban 20  MG Tabs tablet Commonly known as: XARELTO Take 20 mg by mouth daily with supper. What changed: Another medication with the same name was removed. Continue taking this medication, and follow the directions you see here. Changed by: Terry Dawson, NP        Allergies:  Allergies  Allergen Reactions   Lipitor [Atorvastatin] Other (See Comments)    Muscle issues   Penicillins Hives, Itching and Other (See Comments)    Has patient had a PCN reaction causing immediate rash, facial/tongue/throat swelling, SOB or lightheadedness with hypotension: No Has patient had a PCN reaction causing SEVERE RASH INVOLVING MUCUS MEMBRANES or SKIN NECROSIS: #  #  #  YES  #  #  #  Has patient had a PCN reaction that required hospitalization No Has  patient had a PCN reaction occurring within the last 10 years: No     Past Medical History, Surgical history, Social history, and Family History were reviewed and updated.  Review of Systems: All other 10 point review of systems is negative.   Physical Exam:  height is 5\' 7"  (1.702 m) and weight is 171 lb 1.9 oz (77.6 kg). His oral temperature is 98.2 F (36.8 C). His blood pressure is 127/76 and his pulse is 82. His respiration is 18 and oxygen saturation is 98%.   Wt Readings from Last 3 Encounters:  07/07/21 171 lb 1.9 oz (77.6 kg)  01/03/21 168 lb (76.2 kg)  09/13/20 168 lb (76.2 kg)    Ocular: Sclerae unicteric, pupils equal, round and reactive to light Ear-nose-throat: Oropharynx clear, dentition fair Lymphatic: No cervical or supraclavicular adenopathy Lungs no rales or rhonchi, good excursion bilaterally Heart regular rate and rhythm, no murmur appreciated Abd soft, nontender, positive bowel sounds MSK no focal spinal tenderness, no joint edema Neuro: non-focal, well-oriented, appropriate affect Breasts: Deferred   Lab Results  Component Value Date   WBC 9.4 07/07/2021   HGB 15.6 07/07/2021   HCT 45.6 07/07/2021   MCV 88.7 07/07/2021   PLT 241 07/07/2021   No results found for: FERRITIN, IRON, TIBC, UIBC, IRONPCTSAT Lab Results  Component Value Date   RBC 5.14 07/07/2021   No results found for: KPAFRELGTCHN, LAMBDASER, KAPLAMBRATIO No results found for: IGGSERUM, IGA, IGMSERUM No results found for: Odetta Pink, SPEI   Chemistry      Component Value Date/Time   NA 141 01/03/2021 0936   K 4.1 01/03/2021 0936   CL 103 01/03/2021 0936   CO2 31 01/03/2021 0936   BUN 12 01/03/2021 0936   CREATININE 0.97 01/03/2021 0936      Component Value Date/Time   CALCIUM 9.7 01/03/2021 0936   ALKPHOS 59 01/03/2021 0936   AST 20 01/03/2021 0936   ALT 37 01/03/2021 0936   BILITOT 0.8 01/03/2021 0936        Impression and Plan: Terry [name] is a very pleasant 71 yo caucasian gentleman with history of recurrent PE and positive lupus anticoagulant.  He continues to do well on Xarelto, lifelong.  Follow-up in 6 months.   Terry Dawson, NP 2/6/20232:28 PM

## 2021-07-09 LAB — LUPUS ANTICOAGULANT PANEL
DRVVT: 39.1 s (ref 0.0–47.0)
PTT Lupus Anticoagulant: 41 s (ref 0.0–43.5)

## 2021-08-06 DIAGNOSIS — R030 Elevated blood-pressure reading, without diagnosis of hypertension: Secondary | ICD-10-CM | POA: Diagnosis not present

## 2021-08-06 DIAGNOSIS — Z6826 Body mass index (BMI) 26.0-26.9, adult: Secondary | ICD-10-CM | POA: Diagnosis not present

## 2021-08-06 DIAGNOSIS — M4316 Spondylolisthesis, lumbar region: Secondary | ICD-10-CM | POA: Diagnosis not present

## 2021-08-11 IMAGING — CT CT ANGIO CHEST
2 of 8 series · 18 of 36 positions shown · IV contrast (Omnipaque)
Comparison: CT 08/12/2020

CLINICAL DATA: PE suspected, low/intermediate prob, positive
D-dimer re-assess PE on anticoagulation

EXAM:
CT ANGIOGRAPHY CHEST WITH CONTRAST
TECHNIQUE: Multidetector CT imaging of the chest was performed using the
standard protocol during bolus administration of intravenous
contrast. Multiplanar CT image reconstructions and MIPs were
obtained to evaluate the vascular anatomy.
CONTRAST:  75mL OMNIPAQUE IOHEXOL 350 MG/ML SOLN

[Series 6: pe thins · axial · 0.74mm/px · z∈[-279,-33]mm · 17 of 276 slices shown]
[im 15/276  lung]
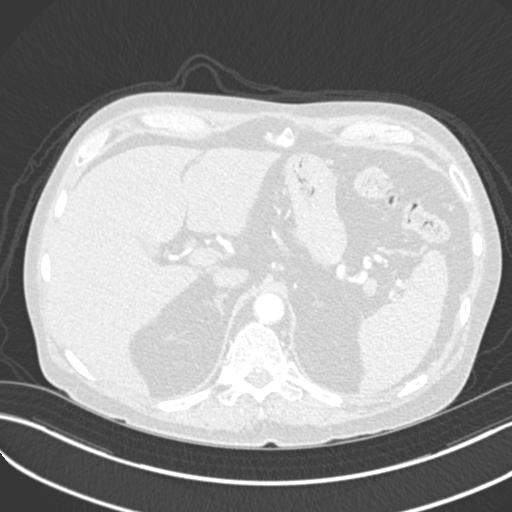
[im 29/276  mediastinal]
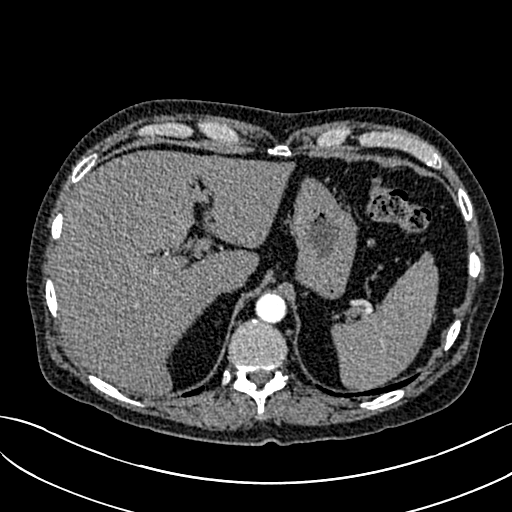
[im 44/276  lung]
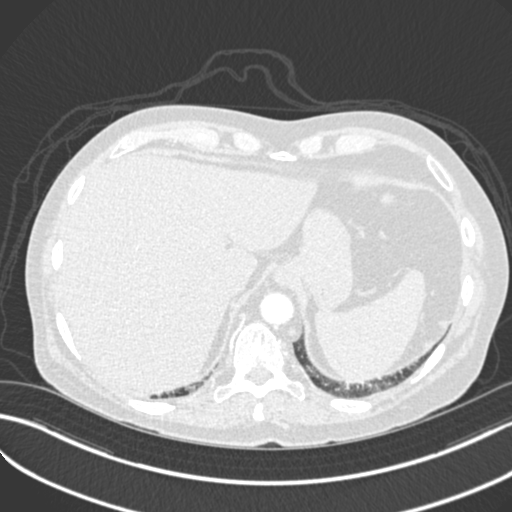
[im 58/276  mediastinal]
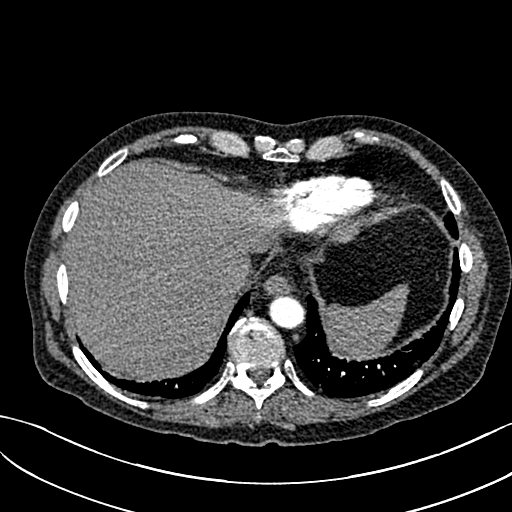
[im 73/276  lung]
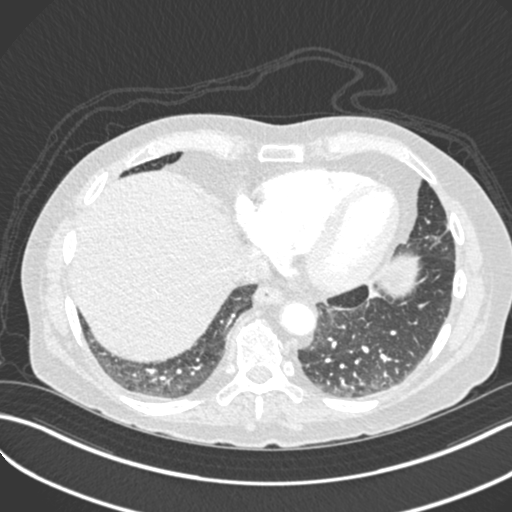
[im 87/276  mediastinal]
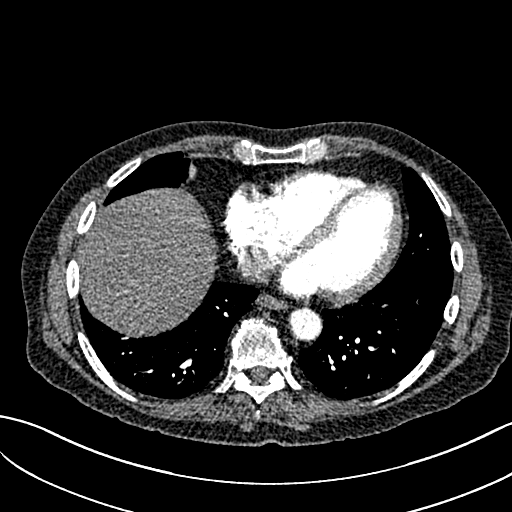
[im 102/276  lung]
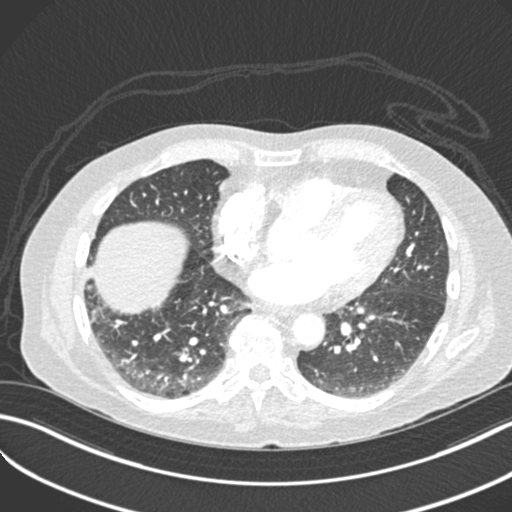
[im 116/276  mediastinal]
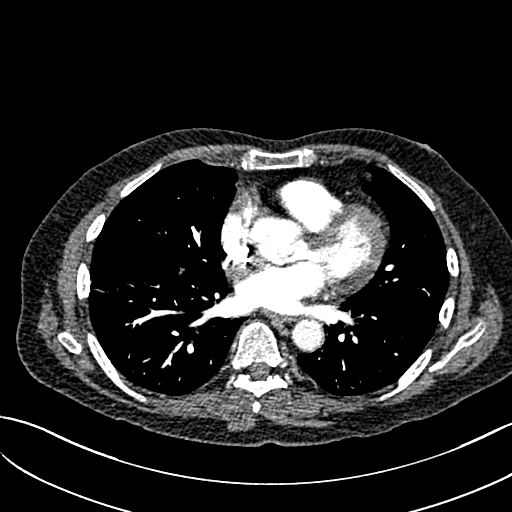
[im 145/276  lung]
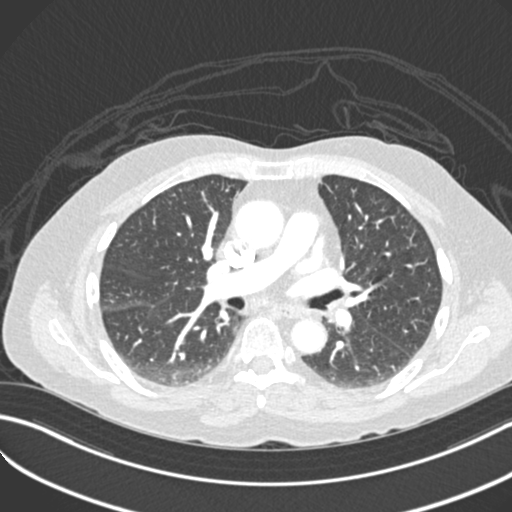
[im 160/276  mediastinal]
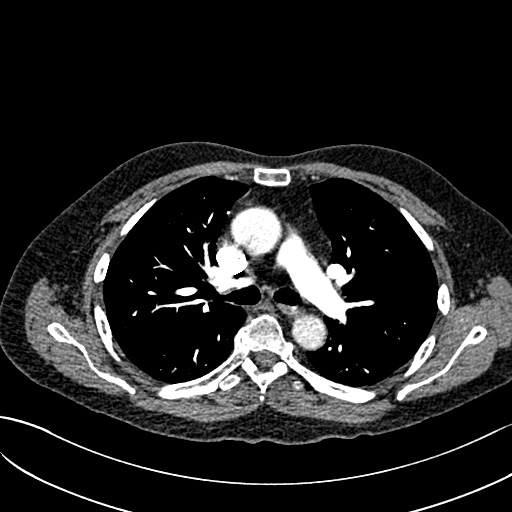
[im 174/276  lung]
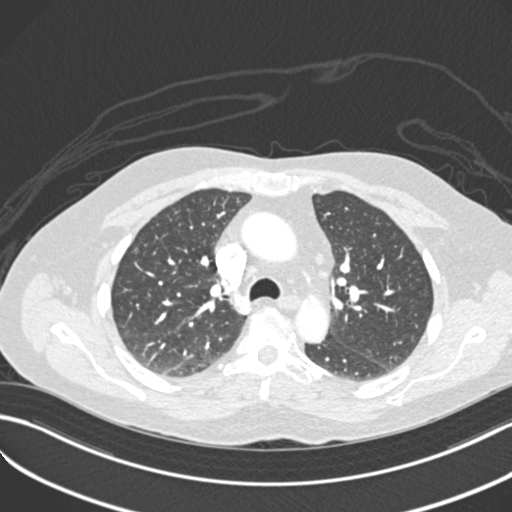
[im 189/276  mediastinal]
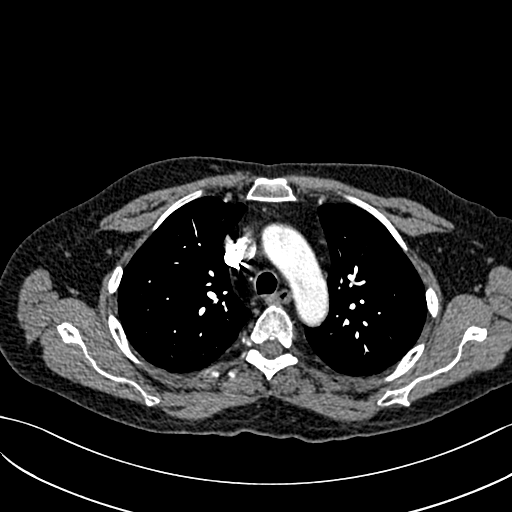
[im 203/276  lung]
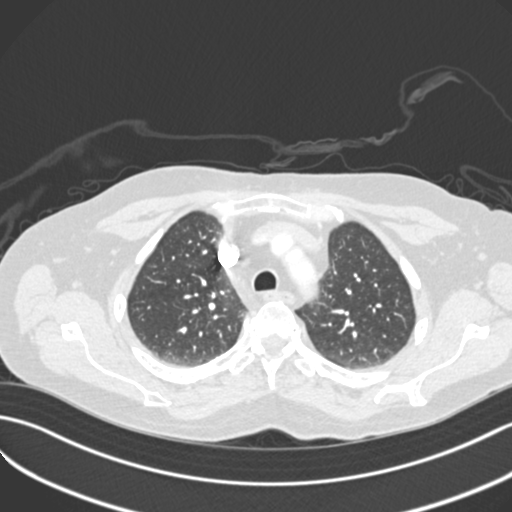
[im 218/276  mediastinal]
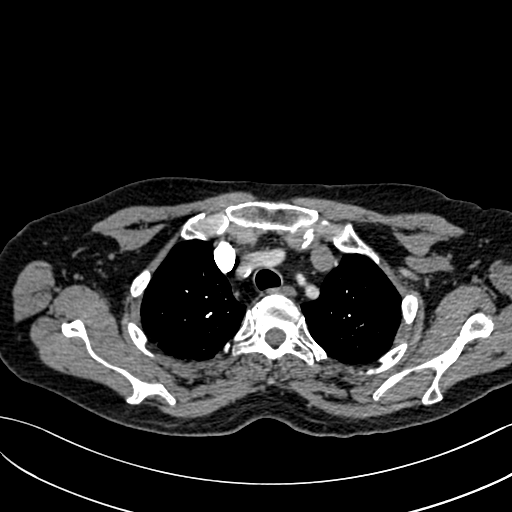
[im 232/276  lung]
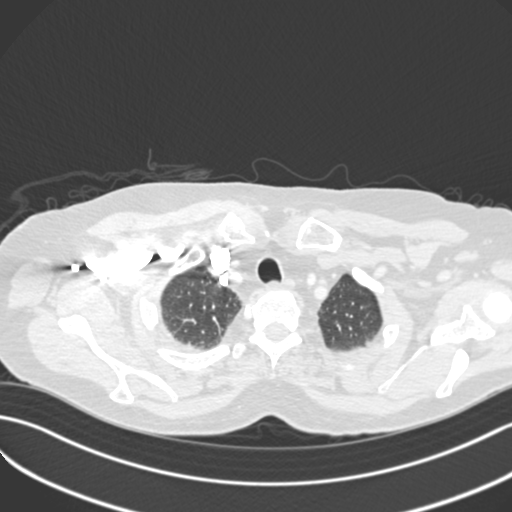
[im 247/276  mediastinal]
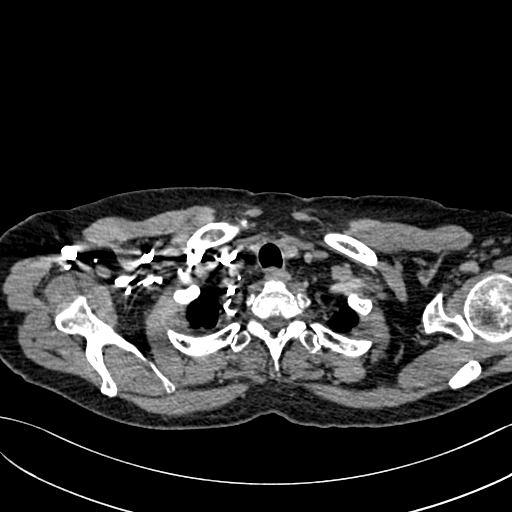
[im 261/276  lung]
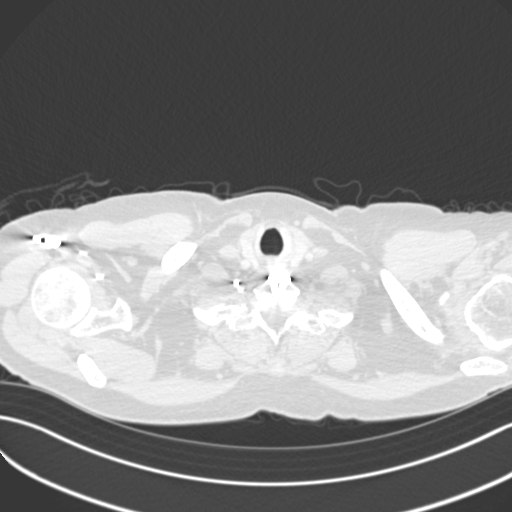

[Series 7: pe coronal mpr · coronal · 0.56mm/px · 1 of 113 slices shown]
[im 57/113  mediastinal]
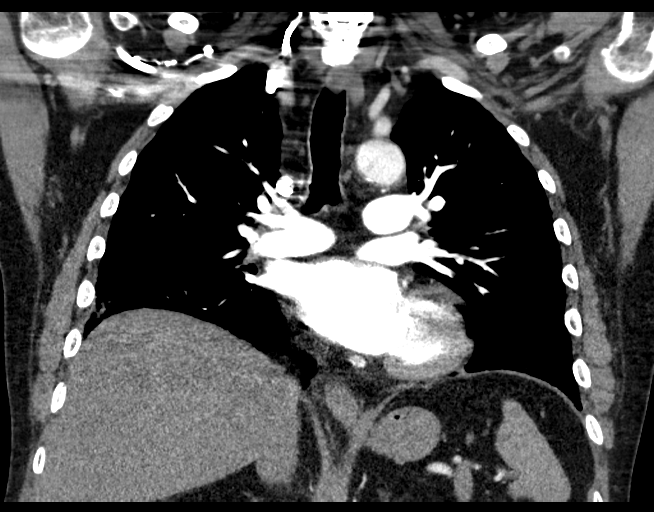

[18 of 36 positions shown; findings below may reference images not displayed]

FINDINGS: Cardiovascular: Satisfactory opacification of the pulmonary arteries
to the segmental branch level. Previously seen filling defect within
the lobar branch of the right lower lobe is no longer seen. No new
filling defects to suggest acute pulmonary embolism. Heart size
within normal limits. Thoracic aorta nonaneurysmal. Atherosclerotic
calcifications of the aorta and coronary arteries.

Mediastinum/Nodes: No enlarged mediastinal, hilar, or axillary lymph
nodes. Thyroid gland, trachea, and esophagus demonstrate no
significant findings.

Lungs/Pleura: Mild right basilar atelectasis. Lungs are otherwise
clear. No pleural effusion or pneumothorax.

Upper Abdomen: Decreased attenuation of the hepatic parenchyma
suggesting hepatic steatosis. No acute findings.

Musculoskeletal: No chest wall abnormality. No acute or significant
osseous findings.

Review of the MIP images confirms the above findings.
IMPRESSION: 1. No evidence of acute pulmonary embolism. Previously seen right
lower lobe PE has resolved.
2. Lungs are clear.
3. Hepatic steatosis.
4. Aortic and coronary artery atherosclerosis ([id number]).

## 2021-08-19 DIAGNOSIS — H532 Diplopia: Secondary | ICD-10-CM | POA: Diagnosis not present

## 2021-08-19 DIAGNOSIS — H43811 Vitreous degeneration, right eye: Secondary | ICD-10-CM | POA: Diagnosis not present

## 2021-08-21 DIAGNOSIS — M5416 Radiculopathy, lumbar region: Secondary | ICD-10-CM | POA: Diagnosis not present

## 2021-08-21 DIAGNOSIS — M5116 Intervertebral disc disorders with radiculopathy, lumbar region: Secondary | ICD-10-CM | POA: Diagnosis not present

## 2021-09-25 DIAGNOSIS — H532 Diplopia: Secondary | ICD-10-CM | POA: Diagnosis not present

## 2021-09-25 DIAGNOSIS — H31001 Unspecified chorioretinal scars, right eye: Secondary | ICD-10-CM | POA: Diagnosis not present

## 2021-09-25 DIAGNOSIS — H43811 Vitreous degeneration, right eye: Secondary | ICD-10-CM | POA: Diagnosis not present

## 2021-09-29 ENCOUNTER — Encounter: Payer: Self-pay | Admitting: Neurology

## 2021-09-29 ENCOUNTER — Ambulatory Visit (INDEPENDENT_AMBULATORY_CARE_PROVIDER_SITE_OTHER): Payer: Medicare Other | Admitting: Neurology

## 2021-09-29 ENCOUNTER — Telehealth: Payer: Self-pay | Admitting: Neurology

## 2021-09-29 VITALS — BP 139/87 | HR 97 | Ht 67.0 in | Wt 170.0 lb

## 2021-09-29 DIAGNOSIS — D72829 Elevated white blood cell count, unspecified: Secondary | ICD-10-CM | POA: Insufficient documentation

## 2021-09-29 DIAGNOSIS — G3184 Mild cognitive impairment, so stated: Secondary | ICD-10-CM | POA: Diagnosis not present

## 2021-09-29 DIAGNOSIS — I8290 Acute embolism and thrombosis of unspecified vein: Secondary | ICD-10-CM | POA: Insufficient documentation

## 2021-09-29 DIAGNOSIS — H259 Unspecified age-related cataract: Secondary | ICD-10-CM | POA: Insufficient documentation

## 2021-09-29 DIAGNOSIS — K921 Melena: Secondary | ICD-10-CM | POA: Insufficient documentation

## 2021-09-29 DIAGNOSIS — G20 Parkinson's disease: Secondary | ICD-10-CM | POA: Diagnosis not present

## 2021-09-29 DIAGNOSIS — I700 Atherosclerosis of aorta: Secondary | ICD-10-CM | POA: Insufficient documentation

## 2021-09-29 DIAGNOSIS — Z7901 Long term (current) use of anticoagulants: Secondary | ICD-10-CM | POA: Insufficient documentation

## 2021-09-29 DIAGNOSIS — Z86711 Personal history of pulmonary embolism: Secondary | ICD-10-CM | POA: Insufficient documentation

## 2021-09-29 DIAGNOSIS — J189 Pneumonia, unspecified organism: Secondary | ICD-10-CM | POA: Insufficient documentation

## 2021-09-29 DIAGNOSIS — R0781 Pleurodynia: Secondary | ICD-10-CM | POA: Insufficient documentation

## 2021-09-29 DIAGNOSIS — G20C Parkinsonism, unspecified: Secondary | ICD-10-CM

## 2021-09-29 MED ORDER — CARBIDOPA-LEVODOPA 25-100 MG PO TABS: 1.0000 | ORAL_TABLET | Freq: Three times a day (TID) | ORAL | 11 refills | Status: DC

## 2021-09-29 NOTE — Telephone Encounter (Signed)
Medicare/BCBS supp no auth req. Sent to Nuclear medicine they will reach out to the patient to schedule.  ?

## 2021-09-29 NOTE — Progress Notes (Signed)
? ?Chief Complaint  ?Patient presents with  ? New Patient (Initial Visit)  ?  Rm 15. Alone. ?NP/paper/[name]/Newly diagnosed movement disorder.  ? ? ? ? ?ASSESSMENT AND PLAN ? ?Terry [name] is a 71 y.o. male   ?Parkinsonism ? Involving right side more than left side, ? Most likely idiopathic Parkinson's disease, his other medical issues, right Achilles tendon surgery, and multiple lumbar decompression surgery, chronic low back pain also contribute to his current functional limitation ? Empirically treated with Sinemet 25/100 mg 3 times daily ? DaTSCAN ?  ?  ? ? ?DIAGNOSTIC DATA (LABS, IMAGING, TESTING) ?- I reviewed patient records, labs, notes, testing and imaging myself where available. ?MRI lumbar in August 2021 ?1. Suspected interval posterior decompression on the right at L4-5 ?with improved patency of the spinal canal. There is a persistent ?extruded disc fragment extending cephalad into the right L4 lateral ?recess with persistent mass effect on the right L4 nerve root. ?2. Stable chronic findings at L2-3 with moderate multifactorial ?spinal stenosis and mild foraminal narrowing bilaterally. ?3. The spinal canal and foramina remain well decompressed post PLIF ?at L3-4. ? ?MRI brain in Jan 2022 ?1. No acute intracranial abnormality. ?2. Old right cerebellar infarct and findings of chronic ?microvascular disease. ?  ?MEDICAL HISTORY: ? ?Terry [name] is a 71 year old male, seen in request by neurosurgeon Dr. Kristeen Miss, for evaluation of possible Parkinson's disease, his primary care physician is Dr. Shon Baton, MD  ? ?I reviewed and summarized the referring note. PMHX. ?Memory loss ?GERD ?PE, on xarelto ?History of kidney and bladder stone. ?Prostate cancer ?Cervical decompression surgery ?Multiple lumbar decompression surgery ? ?Patient noticed right hand shaking around 2022, gradually getting worse, he has mild gait abnormality, but still able to walk up to 5 miles a day,  he had multiple lumbar decompression surgery in the past, continued to experience low back pain ? ?In addition she suffered right Achilles tendon injury in the past, postsurgical infection, required prolonged healing, right Achilles tendon continues to bother him intermittently ? ?He denies loss sense of smell, denies significant difficulty sleeping, denies orthostatic hypotension, ? ?His sister of 60 years old, has mild bilateral hand shaking, otherwise there is no family history of tremor ?  ?Personally reviewed MRI of the brain in January 2022, no acute intracranial abnormality, old right cerebellar infarction, small vessel disease ? ?Labs in February 2023, normal CBC, CMP, ? ?PHYSICAL EXAM: ?  ?Vitals:  ? 09/29/21 0849  ?BP: 139/87  ?Pulse: 97  ?Weight: 170 lb (77.1 kg)  ?Height: '5\' 7"'$  (1.702 m)  ? ?Not recorded ?  ? ? ?Body mass index is 26.63 kg/m?. ? ?PHYSICAL EXAMNIATION: ? ?Gen: NAD, conversant, well nourised, well groomed                     ?Cardiovascular: Regular rate rhythm, no peripheral edema, warm, nontender. ?Eyes: Conjunctivae clear without exudates or hemorrhage ?Neck: Supple, no carotid bruits. ?Pulmonary: Clear to auscultation bilaterally  ? ?NEUROLOGICAL EXAM: ? ?MENTAL STATUS: ?Speech: ?   Speech is normal; fluent and spontaneous with normal comprehension.  ?Cognition: ?    Orientation to time, place and person ?    Normal recent and remote memory ?    Normal Attention span and concentration ?    Normal Language, naming, repeating,spontaneous speech ?    Fund of knowledge ?  ?CRANIAL NERVES: ?CN II: Visual fields are full to confrontation. Pupils are round equal and briskly reactive  to light. ?CN III, IV, VI: extraocular movement are normal. No ptosis. ?CN V: Facial sensation is intact to light touch ?CN VII: Face is symmetric with normal eye closure  ?CN VIII: Hearing is normal to causal conversation. ?CN IX, X: Phonation is normal. ?CN XI: Head turning and shoulder shrug are  intact ? ?MOTOR: Mild right hand intrinsic muscle atrophy, mild right finger abduction weakness, he has mild right more than left rigidity, bradykinesia, ? ?REFLEXES: ?Reflexes are 2+ and symmetric at the biceps, triceps, knees, and absent at the right ankle . Plantar responses are flexor. ? ?SENSORY: ?Intact to light touch, pinprick and vibratory sensation  ? ?COORDINATION: ?There is no trunk or limb dysmetria noted. ? ?GAIT/STANCE: He can get up from seated position arm crossed, no right arm swing, dragging right leg some, moderate stride, mild difficulty turning ? ?REVIEW OF SYSTEMS:  ?Full 14 system review of systems performed and notable only for as above ?All other review of systems were negative. ? ? ?ALLERGIES: ?Allergies  ?Allergen Reactions  ? Lipitor [Atorvastatin] Other (See Comments)  ?  Muscle issues  ? Penicillins Hives, Itching and Other (See Comments)  ?  Has patient had a PCN reaction causing immediate rash, facial/tongue/throat swelling, SOB or lightheadedness with hypotension: No ?Has patient had a PCN reaction causing SEVERE RASH INVOLVING MUCUS MEMBRANES or SKIN NECROSIS: #  #  #  YES  #  #  #  ?Has patient had a PCN reaction that required hospitalization No ?Has patient had a PCN reaction occurring within the last 10 years: No ?  ? ? ?HOME MEDICATIONS: ?Current Outpatient Medications  ?Medication Sig Dispense Refill  ? acetaminophen (TYLENOL) 500 MG tablet Take 500 mg by mouth every 6 (six) hours as needed for mild pain.    ? cetirizine (ZYRTEC) 10 MG tablet Take 10 mg by mouth daily as needed for allergies.    ? cycloSPORINE (RESTASIS) 0.05 % ophthalmic emulsion     ? diclofenac Sodium (VOLTAREN) 1 % GEL Apply topically See admin instructions.    ? Difluprednate 0.05 % EMUL Place 1 drop into the left eye in the morning and at bedtime.    ? diphenhydrAMINE (BENADRYL) 25 MG tablet Take 25 mg by mouth every 6 (six) hours as needed.    ? donepezil (ARICEPT) 5 MG tablet Take 5 mg by mouth at  bedtime.    ? Melatonin 10 MG CAPS Take 1 capsule po q hs    ? methocarbamol (ROBAXIN) 500 MG tablet Take 1 tablet (500 mg total) by mouth every 6 (six) hours as needed for muscle spasms. 40 tablet 3  ? Multiple Vitamins-Minerals (CENTRUM ADULTS PO) take (1) one tab po daily    ? omeprazole (PRILOSEC OTC) 20 MG tablet Take 20 mg by mouth at bedtime.     ? rivaroxaban (XARELTO) 20 MG TABS tablet Take 20 mg by mouth daily with supper.    ? ?No current facility-administered medications for this visit.  ? ? ?PAST MEDICAL HISTORY: ?Past Medical History:  ?Diagnosis Date  ? Arthritis   ? neck, back, knees, right ankle  ? Bladder calculus   ? Chronic back pain   ? Complication of anesthesia   ? dizziness and "crazy amount of bile"  ? GERD (gastroesophageal reflux disease)   ? History of colon polyps   ? benign  ? History of kidney stones   ? and bladder stone  ? History of prostate cancer urologist-- dr borden  ? dx  2014---  s/p  prostatectomy 05-22-2013 ,  Gleason 3+3  ? Hypercholesterolemia   ? takes Crestor daily  ? Hyperlipidemia   ? Pneumonia 10/2019  ? Pulmonary emboli ([place]) 10/2019  ? Wears glasses   ? ? ?PAST SURGICAL HISTORY: ?Past Surgical History:  ?Procedure Laterality Date  ? ACHILLES TENDON SURGERY Right 06-07-2018   '@[facility]'$   ? reconstruction  ? ANTERIOR CERVICAL DECOMP/DISCECTOMY FUSION N/A 06/27/2014  ? Procedure: ANTERIOR CERVICAL DECOMPRESSION/DISCECTOMY FUSION C5-C7   (2 LEVELS);  Surgeon: Melina Schools, MD;  Location: [facility];  Service: Orthopedics;  Laterality: N/A;  ? CARDIAC CATHETERIZATION  2004  ? COLONOSCOPY    ? CYSTOSCOPY WITH LITHOLAPAXY N/A 03/27/2019  ? Procedure: CYSTOSCOPY WITH REMOVAL OF BLADDER STONE AND FOREIGN BODY;  Surgeon: Raynelle Bring, MD;  Location: [facility];  Service: Urology;  Laterality: N/A;  ? EYE SURGERY    ? lazy eye as child, lasik surgery  ? FINGER SURGERY Right   ? 4th finger  ? I & D EXTREMITY Right 07/10/2018  ? Procedure: IRRIGATION AND DEBRIDEMENT RIGHT  ANKLE WOUND AND WOUND VAC PLACEMENT;  Surgeon: Wylene Simmer, MD;  Location: [facility];  Service: Orthopedics;  Laterality: Right;  ? KNEE ARTHROSCOPY Bilateral right ?;  left 05-25-2007  '@[facility]'$   ? POSTERIOR LAMINECTOMY / DECOMP

## 2021-09-30 ENCOUNTER — Telehealth: Payer: Self-pay | Admitting: *Deleted

## 2021-09-30 DIAGNOSIS — M1990 Unspecified osteoarthritis, unspecified site: Secondary | ICD-10-CM | POA: Diagnosis not present

## 2021-09-30 DIAGNOSIS — G20 Parkinson's disease: Secondary | ICD-10-CM | POA: Diagnosis not present

## 2021-09-30 DIAGNOSIS — N200 Calculus of kidney: Secondary | ICD-10-CM | POA: Diagnosis not present

## 2021-09-30 DIAGNOSIS — M25571 Pain in right ankle and joints of right foot: Secondary | ICD-10-CM | POA: Diagnosis not present

## 2021-09-30 DIAGNOSIS — Z7901 Long term (current) use of anticoagulants: Secondary | ICD-10-CM | POA: Diagnosis not present

## 2021-09-30 DIAGNOSIS — E538 Deficiency of other specified B group vitamins: Secondary | ICD-10-CM | POA: Diagnosis not present

## 2021-09-30 DIAGNOSIS — Z86010 Personal history of colonic polyps: Secondary | ICD-10-CM | POA: Diagnosis not present

## 2021-09-30 DIAGNOSIS — D692 Other nonthrombocytopenic purpura: Secondary | ICD-10-CM | POA: Diagnosis not present

## 2021-09-30 DIAGNOSIS — R739 Hyperglycemia, unspecified: Secondary | ICD-10-CM | POA: Diagnosis not present

## 2021-09-30 DIAGNOSIS — R030 Elevated blood-pressure reading, without diagnosis of hypertension: Secondary | ICD-10-CM | POA: Diagnosis not present

## 2021-09-30 DIAGNOSIS — E785 Hyperlipidemia, unspecified: Secondary | ICD-10-CM | POA: Diagnosis not present

## 2021-09-30 DIAGNOSIS — Z86711 Personal history of pulmonary embolism: Secondary | ICD-10-CM | POA: Diagnosis not present

## 2021-09-30 LAB — VITAMIN B12: Vitamin B-12: 237 pg/mL (ref 232–1245)

## 2021-09-30 LAB — RPR: RPR Ser Ql: NONREACTIVE

## 2021-09-30 LAB — TSH: TSH: 2.52 u[IU]/mL (ref 0.450–4.500)

## 2021-09-30 NOTE — Telephone Encounter (Signed)
-----   Message from Marcial Pacas, MD sent at 09/30/2021 11:54 AM EDT ----- ?Please call patient, laboratory evaluation showed ?--Low normal range B12 level, he would benefit to over-the-counter B12 supplement, 1000 mcg daily. ?

## 2021-09-30 NOTE — Telephone Encounter (Signed)
I called the patient and provided him with his lab results. He is agreeable to start the recommended vitamin B12 supplement.  ?

## 2021-10-08 ENCOUNTER — Telehealth: Payer: Self-pay | Admitting: Neurology

## 2021-10-08 ENCOUNTER — Encounter (HOSPITAL_COMMUNITY)
Admission: RE | Admit: 2021-10-08 | Discharge: 2021-10-08 | Disposition: A | Discharge status: Home / Self Care | Payer: Medicare Other | Source: Ambulatory Visit | Attending: Neurology | Admitting: Neurology

## 2021-10-08 DIAGNOSIS — M549 Dorsalgia, unspecified: Secondary | ICD-10-CM | POA: Diagnosis not present

## 2021-10-08 DIAGNOSIS — G20 Parkinson's disease: Secondary | ICD-10-CM | POA: Insufficient documentation

## 2021-10-08 MED ORDER — POTASSIUM IODIDE (ANTIDOTE) 130 MG PO TABS
ORAL_TABLET | ORAL | Status: AC
  Filled 2021-10-08: qty 1

## 2021-10-08 MED ORDER — IOFLUPANE I 123 185 MBQ/2.5ML IV SOLN
3.9000 | Freq: Once | INTRAVENOUS | Status: AC | PRN
  Administered 2021-10-08: 3.9 via INTRAVENOUS
  Filled 2021-10-08: qty 5

## 2021-10-08 NOTE — Telephone Encounter (Signed)
Please call patient, DaTSCAN showed evidence of dopamine deficiency, more on the right side, ? ?Above findings support his diagnosis of Parkinson's disease, please check to see if Sinemet has helped him, ? ? ? ?Decreased radiotracer activity within the striata is a pattern ?suggestive of Parkinson's syndrome pathology. Greater loss activity ?on the RIGHT. ?  ?Of note, DaTSCAN is not diagnostic of Parkinsonian syndromes, which ?remains a clinical diagnosis. DaTscan is an adjuvant test to aid in ?the clinical diagnosis of Parkinsonian syndromes. ?  ?

## 2021-10-09 NOTE — Telephone Encounter (Signed)
I spoke to the patient. He verbalized understanding of the results. Feels the Sinemet is helping since starting the medication. He has also started going back to the [facility]. He is doing yoga and plans to swim. He will keep his pending follow up.  ?

## 2021-10-15 DIAGNOSIS — H43391 Other vitreous opacities, right eye: Secondary | ICD-10-CM | POA: Diagnosis not present

## 2021-10-15 DIAGNOSIS — H43811 Vitreous degeneration, right eye: Secondary | ICD-10-CM | POA: Diagnosis not present

## 2021-11-27 DIAGNOSIS — H43391 Other vitreous opacities, right eye: Secondary | ICD-10-CM | POA: Diagnosis not present

## 2021-11-27 DIAGNOSIS — H43311 Vitreous membranes and strands, right eye: Secondary | ICD-10-CM | POA: Diagnosis not present

## 2021-12-26 DIAGNOSIS — H43391 Other vitreous opacities, right eye: Secondary | ICD-10-CM | POA: Diagnosis not present

## 2022-02-03 DIAGNOSIS — Z6826 Body mass index (BMI) 26.0-26.9, adult: Secondary | ICD-10-CM | POA: Diagnosis not present

## 2022-02-03 DIAGNOSIS — M5416 Radiculopathy, lumbar region: Secondary | ICD-10-CM | POA: Diagnosis not present

## 2022-02-04 ENCOUNTER — Other Ambulatory Visit: Payer: Self-pay | Admitting: Neurological Surgery

## 2022-02-04 DIAGNOSIS — M5416 Radiculopathy, lumbar region: Secondary | ICD-10-CM

## 2022-02-08 ENCOUNTER — Ambulatory Visit
Admission: RE | Admit: 2022-02-08 | Discharge: 2022-02-08 | Disposition: A | Discharge status: Home / Self Care | Payer: Medicare Other | Source: Ambulatory Visit | Attending: Neurological Surgery | Admitting: Neurological Surgery

## 2022-02-08 DIAGNOSIS — M48061 Spinal stenosis, lumbar region without neurogenic claudication: Secondary | ICD-10-CM | POA: Diagnosis not present

## 2022-02-08 DIAGNOSIS — M5126 Other intervertebral disc displacement, lumbar region: Secondary | ICD-10-CM | POA: Diagnosis not present

## 2022-02-08 DIAGNOSIS — M5125 Other intervertebral disc displacement, thoracolumbar region: Secondary | ICD-10-CM | POA: Diagnosis not present

## 2022-02-08 DIAGNOSIS — M5416 Radiculopathy, lumbar region: Secondary | ICD-10-CM

## 2022-02-08 DIAGNOSIS — M4319 Spondylolisthesis, multiple sites in spine: Secondary | ICD-10-CM | POA: Diagnosis not present

## 2022-02-08 MED ORDER — GADOBENATE DIMEGLUMINE 529 MG/ML IV SOLN
16.0000 mL | Freq: Once | INTRAVENOUS | Status: AC | PRN
  Administered 2022-02-08: 16 mL via INTRAVENOUS

## 2022-02-12 DIAGNOSIS — D045 Carcinoma in situ of skin of trunk: Secondary | ICD-10-CM | POA: Diagnosis not present

## 2022-02-12 DIAGNOSIS — L570 Actinic keratosis: Secondary | ICD-10-CM | POA: Diagnosis not present

## 2022-02-12 DIAGNOSIS — L820 Inflamed seborrheic keratosis: Secondary | ICD-10-CM | POA: Diagnosis not present

## 2022-02-12 DIAGNOSIS — Z85828 Personal history of other malignant neoplasm of skin: Secondary | ICD-10-CM | POA: Diagnosis not present

## 2022-02-12 DIAGNOSIS — L821 Other seborrheic keratosis: Secondary | ICD-10-CM | POA: Diagnosis not present

## 2022-02-12 DIAGNOSIS — D692 Other nonthrombocytopenic purpura: Secondary | ICD-10-CM | POA: Diagnosis not present

## 2022-02-16 ENCOUNTER — Encounter: Payer: Self-pay | Admitting: Neurology

## 2022-02-16 ENCOUNTER — Ambulatory Visit (INDEPENDENT_AMBULATORY_CARE_PROVIDER_SITE_OTHER): Payer: Medicare Other | Admitting: Neurology

## 2022-02-16 VITALS — BP 148/83 | HR 56 | Ht 67.0 in | Wt 172.0 lb

## 2022-02-16 DIAGNOSIS — G20 Parkinson's disease: Secondary | ICD-10-CM

## 2022-02-16 DIAGNOSIS — G20C Parkinsonism, unspecified: Secondary | ICD-10-CM

## 2022-02-16 DIAGNOSIS — G3184 Mild cognitive impairment, so stated: Secondary | ICD-10-CM | POA: Diagnosis not present

## 2022-02-16 DIAGNOSIS — G4710 Hypersomnia, unspecified: Secondary | ICD-10-CM

## 2022-02-16 MED ORDER — CARBIDOPA-LEVODOPA 25-100 MG PO TABS: 1.0000 | ORAL_TABLET | Freq: Three times a day (TID) | ORAL | 3 refills | Status: DC

## 2022-02-16 MED ORDER — DONEPEZIL HCL 10 MG PO TABS: 10.0000 mg | ORAL_TABLET | Freq: Every day | ORAL | 3 refills | Status: DC

## 2022-02-16 NOTE — Progress Notes (Addendum)
Chief Complaint  Patient presents with   Follow-up    Rm 15. Accompanied by wife. C/o blurry vision in left eye. Denies any tremors.      ASSESSMENT AND PLAN  Terry [name] is a 71 y.o. male   Parkinsonism Cognitive impairment  MOCA examination 18/30  DaTSCAN in May 2023 showed decreased radioactive activity within the striata, greater loss on the right  Idiopathic Parkinson's disease or Parkinson-like disease, he does respond to Sinemet 25/100 mg 3 times a day, the atypical feature is early onset cognitive impairment   At risk for obstructive sleep apnea  Referral to sleep study Worsening low back pain radiating pain to right lower extremity  Personally reviewed MRI of lumbar September 2023, PLIF L3-4 L4-5, disc bulging with new superimposed right foraminal extrusion with superior migration new severe right neuroforaminal stenosis, under the care of Dr. Ellene Route, pending surgical decompression  DIAGNOSTIC DATA (LABS, IMAGING, TESTING) - I reviewed patient records, labs, notes, testing and imaging myself where available. MRI lumbar in August 2021 1. Suspected interval posterior decompression on the right at L4-5 with improved patency of the spinal canal. There is a persistent extruded disc fragment extending cephalad into the right L4 lateral recess with persistent mass effect on the right L4 nerve root. 2. Stable chronic findings at L2-3 with moderate multifactorial spinal stenosis and mild foraminal narrowing bilaterally. 3. The spinal canal and foramina remain well decompressed post PLIF at L3-4.  MRI brain in Jan 2022 1. No acute intracranial abnormality. 2. Old right cerebellar infarct and findings of chronic microvascular disease.   DatScan in May 2023 Decreased radiotracer activity within the striata is a pattern suggestive of Parkinson's syndrome pathology. Greater loss activity on the RIGHT.   Of note, DaTSCAN is not diagnostic of Parkinsonian syndromes,  which remains a clinical diagnosis. DaTscan is an adjuvant test to aid in the clinical diagnosis of Parkinsonian syndromes.   MEDICAL HISTORY:  Terry [name] is a 71 year old male, seen in request by neurosurgeon Dr. Kristeen Miss, for evaluation of possible Parkinson's disease, his primary care physician is Dr. Shon Baton, MD   I reviewed and summarized the referring note. PMHX. Memory loss GERD PE, on xarelto History of kidney and bladder stone. Prostate cancer Cervical decompression surgery Multiple lumbar decompression surgery  Patient noticed right hand shaking around 2022, gradually getting worse, he has mild gait abnormality, but still able to walk up to 5 miles a day, he had multiple lumbar decompression surgery in the past, continued to experience low back pain  In addition he suffered right Achilles tendon injury in the past, postsurgical infection, required prolonged healing, right Achilles tendon continues to bother him intermittently  He denies loss sense of smell, denies significant difficulty sleeping, denies orthostatic hypotension,  His sister of 58 years old, has mild bilateral hand shaking, otherwise there is no family history of tremor   Personally reviewed MRI of the brain in January 2022, no acute intracranial abnormality, old right cerebellar infarction, small vessel disease  Labs in February 2023, normal CBC, CMP,  UPDATE Sept 18 2023: He is accompanied by his wife at today's visit, tolerating Sinemet 25/100 mg 3 times a day well, does notice mild improvement, has less hand shaking,  DaTSCAN May 2023 showed decreased radiotracer activity within striata consistent with Parkinson syndrome pathology, greater loss activity on the right,  He continue has mild memory loss, MoCA examination today is 18 out of 30, noticeable visual-spatial executive dysfunction, slow reaction time,  short-term memory loss, he denies hallucinations, does complains of excessive day  time fatigue and drowsiness,  He continue has mild gait abnormality, which is certainly complicated with his worsening low back pain, radiating pain to right lower extremity, we personally reviewed MRI of lumbar spine in September 2023, new right foraminal disc extrusion with superior migration at L2-3 with severe right neuroforaminal stenosis, going to be reevaluated by Dr. Ellene Route soon   PHYSICAL EXAM:   Vitals:   02/16/22 0943 02/16/22 0944  BP: (!) 162/81 (!) 148/83  Pulse: (!) 57 (!) 56  Weight: 172 lb (78 kg)   Height: '5\' 7"'$  (1.702 m)    Not recorded     Body mass index is 26.94 kg/m.  PHYSICAL EXAMNIATION:  Gen: NAD, conversant, well nourised, well groomed                     Cardiovascular: Regular rate rhythm, no peripheral edema, warm, nontender. Eyes: Conjunctivae clear without exudates or hemorrhage Neck: Supple, no carotid bruits. Pulmonary: Clear to auscultation bilaterally   NEUROLOGICAL EXAM:  MENTAL STATUS: Speech/cognition: Awake, alert, oriented to history taking and casual conversation.    02/16/2022   10:00 AM 09/29/2021    9:49 AM  Montreal Cognitive Assessment   Visuospatial/ Executive (0/5) 1 2  Naming (0/3) 3 3  Attention: Read list of digits (0/2) 2 2  Attention: Read list of letters (0/1) 1 1  Attention: Serial 7 subtraction starting at 100 (0/3) 1 2  Language: Repeat phrase (0/2) 2 1  Language : Fluency (0/1) 1 1  Abstraction (0/2) 2 2  Delayed Recall (0/5) 0 0  Orientation (0/6) 5 4  Total 18 18  Adjusted Score (based on education)  18      CRANIAL NERVES: CN II: Visual fields are full to confrontation. Pupils are round equal and briskly reactive to light. CN III, IV, VI: extraocular movement are normal. No ptosis. CN V: Facial sensation is intact to light touch CN VII: Face is symmetric with normal eye closure  CN VIII: Hearing is normal to causal conversation. CN IX, X: Phonation is normal. CN XI: Head turning and shoulder shrug  are intact CN XII: Narrow oropharyngeal space  MOTOR: Mild right hand intrinsic muscle atrophy, mild right finger abduction weakness, fairly symmetric mild rigidity, bradykinesia, REFLEXES: Reflexes are 2+ and symmetric at the biceps, triceps, knees, and absent at the right ankle . Plantar responses are flexor.  SENSORY: Intact to light touch, pinprick and vibratory sensation   COORDINATION: There is no trunk or limb dysmetria noted.  GAIT/STANCE: He can get up from seated position arm crossed, decreased right arm swing, antalgic,  REVIEW OF SYSTEMS:  Full 14 system review of systems performed and notable only for as above All other review of systems were negative.   ALLERGIES: Allergies  Allergen Reactions   Lipitor [Atorvastatin] Other (See Comments)    Muscle issues   Penicillins Hives, Itching and Other (See Comments)    Has patient had a PCN reaction causing immediate rash, facial/tongue/throat swelling, SOB or lightheadedness with hypotension: No Has patient had a PCN reaction causing SEVERE RASH INVOLVING MUCUS MEMBRANES or SKIN NECROSIS: #  #  #  YES  #  #  #  Has patient had a PCN reaction that required hospitalization No Has patient had a PCN reaction occurring within the last 10 years: No     HOME MEDICATIONS: Current Outpatient Medications  Medication Sig Dispense Refill  acetaminophen (TYLENOL) 500 MG tablet Take 500 mg by mouth every 6 (six) hours as needed for mild pain.     carbidopa-levodopa (SINEMET IR) 25-100 MG tablet Take 1 tablet by mouth 3 (three) times daily. 90 tablet 11   cetirizine (ZYRTEC) 10 MG tablet Take 10 mg by mouth daily as needed for allergies.     diclofenac Sodium (VOLTAREN) 1 % GEL Apply topically See admin instructions.     diphenhydrAMINE (BENADRYL) 25 MG tablet Take 25 mg by mouth every 6 (six) hours as needed.     donepezil (ARICEPT) 5 MG tablet Take 5 mg by mouth at bedtime.     Melatonin 10 MG CAPS Take 1 capsule po q hs      methocarbamol (ROBAXIN) 500 MG tablet Take 1 tablet (500 mg total) by mouth every 6 (six) hours as needed for muscle spasms. 40 tablet 3   Multiple Vitamins-Minerals (CENTRUM ADULTS PO) take (1) one tab po daily     omeprazole (PRILOSEC OTC) 20 MG tablet Take 20 mg by mouth at bedtime.      rivaroxaban (XARELTO) 20 MG TABS tablet Take 20 mg by mouth daily with supper.     No current facility-administered medications for this visit.    PAST MEDICAL HISTORY: Past Medical History:  Diagnosis Date   Arthritis    neck, back, knees, right ankle   Bladder calculus    Chronic back pain    Complication of anesthesia    dizziness and "crazy amount of bile"   GERD (gastroesophageal reflux disease)    History of colon polyps    benign   History of kidney stones    and bladder stone   History of prostate cancer urologist-- dr Alinda Money   dx 2014---  s/p  prostatectomy 05-22-2013 ,  Gleason 3+3   Hypercholesterolemia    takes Crestor daily   Hyperlipidemia    Pneumonia 10/2019   Pulmonary emboli ([place]) 10/2019   Wears glasses     PAST SURGICAL HISTORY: Past Surgical History:  Procedure Laterality Date   ACHILLES TENDON SURGERY Right 06-07-2018   '@[facility]'$    reconstruction   ANTERIOR CERVICAL DECOMP/DISCECTOMY FUSION N/A 06/27/2014   Procedure: ANTERIOR CERVICAL DECOMPRESSION/DISCECTOMY FUSION C5-C7   (2 LEVELS);  Surgeon: Melina Schools, MD;  Location: [facility];  Service: Orthopedics;  Laterality: N/A;   CARDIAC CATHETERIZATION  2004   COLONOSCOPY     CYSTOSCOPY WITH LITHOLAPAXY N/A 03/27/2019   Procedure: CYSTOSCOPY WITH REMOVAL OF BLADDER STONE AND FOREIGN BODY;  Surgeon: Raynelle Bring, MD;  Location[facility];  Service: Urology;  Laterality: N/A;   EYE SURGERY     lazy eye as child, lasik surgery   FINGER SURGERY Right    4th finger   I & D EXTREMITY Right 07/10/2018   Procedure: IRRIGATION AND DEBRIDEMENT RIGHT ANKLE WOUND AND WOUND VAC PLACEMENT;  Surgeon: Wylene Simmer, MD;   Location: [facility];  Service: Orthopedics;  Laterality: Right;   KNEE ARTHROSCOPY Bilateral right ?;  left 05-25-2007  '@[facility]'$    POSTERIOR LAMINECTOMY / DECOMPRESSION LUMBAR SPINE  08-31-2016    dr elsner  '@[facility]'$    PROSTATE BIOPSY  04/13/13   gleason 3+3=6, vol 55.4 cc   ROBOT ASSISTED LAPAROSCOPIC RADICAL PROSTATECTOMY N/A 05/22/2013   Procedure: ROBOTIC ASSISTED LAPAROSCOPIC RADICAL PROSTATECTOMY LEVEL 1;  Surgeon: Dutch Gray, MD;  Location: [facility];  Service: Urology;  Laterality: N/A;   TONSILLECTOMY      FAMILY HISTORY: Family History  Problem Relation  Age of Onset   Pneumonia Mother        bacterial   Dementia Mother    Heart disease Father    Heart attack Father    Colon cancer Neg Hx     SOCIAL HISTORY: Social History   Socioeconomic History   Marital status: Married    Spouse name: Not on file   Number of children: 2   Years of education: Not on file   Highest education level: Not on file  Occupational History   Occupation: retired  Tobacco Use   Smoking status: Never   Smokeless tobacco: Never  Vaping Use   Vaping Use: Never used  Substance and Sexual Activity   Alcohol use: Yes    Alcohol/week: 0.0 standard drinks of alcohol    Comment: occ   Drug use: Never   Sexual activity: Not on file    Comment: vasectomy  Other Topics Concern   Not on file  Social History Narrative   Not on file   Social Determinants of Health   Financial Resource Strain: Not on file  Food Insecurity: Not on file  Transportation Needs: Not on file  Physical Activity: Not on file  Stress: Not on file  Social Connections: Not on file  Intimate Partner Violence: Not on file      Marcial Pacas, M.D. Ph.D.  [facility] [address] [place], [place] [address] Ph: [phone] Fax: [phone]  CC:  Shon Baton, MD [place],  [place] [address]  Shon Baton, MD  .  -

## 2022-03-01 HISTORY — PX: OTHER SURGICAL HISTORY: SHX169

## 2022-03-03 DIAGNOSIS — Z23 Encounter for immunization: Secondary | ICD-10-CM | POA: Diagnosis not present

## 2022-03-06 DIAGNOSIS — M5136 Other intervertebral disc degeneration, lumbar region: Secondary | ICD-10-CM | POA: Diagnosis not present

## 2022-03-16 ENCOUNTER — Other Ambulatory Visit: Payer: Self-pay | Admitting: Neurological Surgery

## 2022-03-19 ENCOUNTER — Ambulatory Visit: Payer: Medicare Other | Admitting: Internal Medicine

## 2022-03-20 ENCOUNTER — Other Ambulatory Visit: Payer: Self-pay

## 2022-03-20 ENCOUNTER — Encounter (HOSPITAL_COMMUNITY): Payer: Self-pay | Admitting: Neurological Surgery

## 2022-03-20 NOTE — Progress Notes (Signed)
PCP - Dr. Virgina Jock Cardiologist - Denies EKG -  Chest x-ray -  ECHO - 08/13/20 Cardiac Cath - 2004 CPAP - Sleep study possibly in the near future DM k- Denies Blood Thinner Instructions: Xarelto Per patient Dr. Virgina Jock requested 3 days minimum to hold prior to surgery.Patient to verify with Elsner.  Aspirin Instructions: denies ERAS Protcol - Yes COVID TEST-   Anesthesia review: No   -------------  SDW INSTRUCTIONS:  Your procedure is scheduled on 03/24/22 Tuesday. Please report to [facility] Main Entrance "A" at 850 A.M., and check in at the Admitting office. Call this number if you have problems the morning of surgery: [phone]   Remember: Do not eat  after midnight the night before your surgery  You may drink clear liquids until 0810 the morning of your surgery.   Clear liquids allowed are: Water, Non-Citrus Juices (without pulp), Carbonated Beverages, Clear Tea, Black Coffee Only, and Gatorade   Medications to take morning of surgery with a sip of water include: Sinemet, Prilosec IF NEEDED: Tyl, Zyrtec, refresh Hold the voltaren  As of today, STOP taking any Aspirin (unless otherwise instructed by your surgeon), Aleve, Naproxen, Ibuprofen, Motrin, Advil, Goody's, BC's, all herbal medications, fish oil, and all vitamins.    The Morning of Surgery Do not wear jewelry Do not wear lotions, powders, or colognes, or deodorant Do not bring valuables to the hospital. [facility] is not responsible for any belongings or valuables.    Remember that you must have someone to transport you home after your surgery, and remain with you for 24 hours if you are discharged the same day.  Please bring cases for contacts, glasses, hearing aids, dentures or bridgework because it cannot be worn into surgery.   Patients discharged the day of surgery will not be allowed to drive home.   Please shower the NIGHT BEFORE/MORNING OF SURGERY (use antibacterial soap like DIAL soap if possible).  Wear comfortable clothes the morning of surgery. Oral Hygiene is also important to reduce your risk of infection.  Remember - BRUSH YOUR TEETH THE MORNING OF SURGERY WITH YOUR REGULAR TOOTHPASTE  Patient denies shortness of breath, fever, cough and chest pain.

## 2022-03-23 ENCOUNTER — Ambulatory Visit (INDEPENDENT_AMBULATORY_CARE_PROVIDER_SITE_OTHER): Payer: Medicare Other | Admitting: Neurology

## 2022-03-23 ENCOUNTER — Encounter: Payer: Self-pay | Admitting: Neurology

## 2022-03-23 VITALS — BP 135/77 | HR 56 | Ht 68.0 in | Wt 172.0 lb

## 2022-03-23 DIAGNOSIS — R0683 Snoring: Secondary | ICD-10-CM

## 2022-03-23 DIAGNOSIS — R419 Unspecified symptoms and signs involving cognitive functions and awareness: Secondary | ICD-10-CM

## 2022-03-23 DIAGNOSIS — G20C Parkinsonism, unspecified: Secondary | ICD-10-CM

## 2022-03-23 NOTE — Patient Instructions (Signed)

## 2022-03-23 NOTE — Progress Notes (Signed)
Subjective:    Patient ID: Terry [name] is a 71 y.o. male.  HPI    Terry Age, MD, PhD [facility] [address], [address]O. [place], [place] [address]  Dear [name],  I saw your patient, Terry [name], upon your kind request in my sleep clinic today for initial consultation of his sleep disorder, in particular, concern for underlying obstructive sleep apnea.  The patient is unaccompanied today.  As you know, Terry [name] is a 71 year old gentleman with an underlying medical history of parkinsonism, memory loss, reflux disease, cervical degenerative disc disease, lumbar degenerative disc disease, history of PE, on Xarelto, history of kidney and bladder stones, and overweight state, who reports difficulty initiating and maintaining sleep, snoring, nonrestorative sleep.  He reports that part of his sleep issues are discomfort and pain at night.  He has low back pain radiating to the right, in fact, he is scheduled for lumbar spine surgery under Terry. Ellene [name] tomorrow.  He also has residual right ankle pain, had a complicated Achillis tendon surgery some 4 years ago, still has discomfort in his right foot.  He takes melatonin at night, unclear of the dose.  He and his wife do not sleep in the same bedroom typically.  I reviewed your office note from 02/16/2022.  His Epworth sleepiness score is 11 out of 24, fatigue severity score is 22 out of 63.  He has had 3 other back surgeries, also neck surgery, he had a prostatectomy nearly 9 years ago for prostate cancer.  He lives with his wife, he has 2 grown daughters, he is retired from [employer].  They have 1 dog in the household.  He drinks caffeine in the form of coffee, typically 1 cup in the morning, occasional alcohol, he is a non-smoker.  He does not have night to night nocturia and denies recurrent morning headaches or nocturnal headaches.  Bedtime is generally between 11 and 11:30 PM and rise time around 7.  He had a  tonsillectomy at [name] 60.  His Past Medical History Is Significant For: Past Medical History:  Diagnosis Date   Arthritis    neck, back, knees, right ankle   Bladder calculus    Chronic back pain    GERD (gastroesophageal reflux disease)    History of colon polyps    benign   History of kidney stones    and bladder stone   History of prostate cancer urologist-- Terry [name]   dx 2014---  s/p  prostatectomy 05-22-2013 ,  Gleason 3+3   Hypercholesterolemia    takes Crestor daily   Hyperlipidemia    Pneumonia 10/2019   Pulmonary emboli (Terry [name]) 10/2019   Wears glasses     His Past Surgical History Is Significant For: Past Surgical History:  Procedure Laterality Date   ACHILLES TENDON SURGERY Right 06-07-2018   '@[facility]'$    reconstruction   ANTERIOR CERVICAL DECOMP/DISCECTOMY FUSION N/A 06/27/2014   Procedure: ANTERIOR CERVICAL DECOMPRESSION/DISCECTOMY FUSION C5-C7   (2 LEVELS);  Surgeon: Melina Schools, MD;  Location: [facility];  Service: Orthopedics;  Laterality: N/A;   CARDIAC CATHETERIZATION  2004   COLONOSCOPY     CYSTOSCOPY WITH LITHOLAPAXY N/A 03/27/2019   Procedure: CYSTOSCOPY WITH REMOVAL OF BLADDER STONE AND FOREIGN BODY;  Surgeon: Raynelle Bring, MD;  [facility];  Service: Urology;  Laterality: N/A;   EYE SURGERY     lazy eye as child, lasik surgery   FINGER SURGERY Right    4th finger  I & D EXTREMITY Right 07/10/2018   Procedure: IRRIGATION AND DEBRIDEMENT RIGHT ANKLE WOUND AND WOUND VAC PLACEMENT;  Surgeon: Wylene Simmer, MD;  Location: [facility];  Service: Orthopedics;  Laterality: Right;   KNEE ARTHROSCOPY Bilateral right ?;  left 05-25-2007  '@[facility]'$    POSTERIOR LAMINECTOMY / DECOMPRESSION LUMBAR SPINE  08-31-2016    Terry elsner  '@[facility]'$    PROSTATE BIOPSY  04/13/13   gleason 3+3=6, vol 55.4 cc   ROBOT ASSISTED LAPAROSCOPIC RADICAL PROSTATECTOMY N/A 05/22/2013   Procedure: ROBOTIC ASSISTED LAPAROSCOPIC RADICAL PROSTATECTOMY LEVEL 1;  Surgeon: Dutch Gray, MD;   [facility];  Service: Urology;  Laterality: N/A;   TONSILLECTOMY      His Family History Is Significant For: Family History  Problem Relation [name] of Onset   Pneumonia Mother        bacterial   Dementia Mother    Heart disease Father    Heart attack Father    Colon cancer Neg Hx    Sleep apnea Neg Hx     His Social History Is Significant For: Social History   Socioeconomic History   Marital status: Married    Spouse name: Terry [name]   Number of children: 2   Years of education: Not on file   Highest education level: Not on file  Occupational History   Occupation: retired  Tobacco Use   Smoking status: Never   Smokeless tobacco: Never  Vaping Use   Vaping Use: Never used  Substance and Sexual Activity   Alcohol use: Yes    Alcohol/week: 0.0 standard drinks of alcohol    Comment: occ   Drug use: Never   Sexual activity: Not Currently    Comment: vasectomy  Other Topics Concern   Not on file  Social History Narrative   Not on file   Social Determinants of Health   Financial Resource Strain: Not on file  Food Insecurity: Not on file  Transportation Needs: Not on file  Physical Activity: Not on file  Stress: Not on file  Social Connections: Not on file    His Allergies Are:  Allergies  Allergen Reactions   Lipitor [Atorvastatin] Other (See Comments)    Muscle issues   Penicillins Hives, Itching and Other (See Comments)    Has patient had a PCN reaction causing immediate rash, facial/tongue/throat swelling, SOB or lightheadedness with hypotension: No Has patient had a PCN reaction causing SEVERE RASH INVOLVING MUCUS MEMBRANES or SKIN NECROSIS: #  #  #  YES  #  #  #  Has patient had a PCN reaction that required hospitalization No Has patient had a PCN reaction occurring within the last 10 years: No   :   His Current Medications Are:  Outpatient Encounter Medications as of 03/23/2022  Medication Sig   acetaminophen (TYLENOL) 500 MG tablet Take 500 mg by  mouth daily.   carbidopa-levodopa (SINEMET IR) 25-100 MG tablet Take 1 tablet by mouth 3 (three) times daily.   cetirizine (ZYRTEC) 10 MG tablet Take 10 mg by mouth daily as needed for allergies.   cyanocobalamin (VITAMIN B12) 1000 MCG tablet Take 1,000 mcg by mouth daily.   diclofenac Sodium (VOLTAREN) 1 % GEL Apply 2 g topically daily as needed (lower back pain).   donepezil (ARICEPT) 10 MG tablet Take 1 tablet (10 mg total) by mouth at bedtime.   MELATONIN PO Take 2 tablets by mouth at bedtime.   omeprazole (PRILOSEC OTC) 20 MG tablet Take 20 mg by mouth every morning.  Polyvinyl Alcohol-Povidone PF (REFRESH) 1.4-0.6 % SOLN Place 1 drop into both eyes daily as needed (luberication and dry eyes).   rivaroxaban (XARELTO) 20 MG TABS tablet Take 20 mg by mouth daily with supper.   No facility-administered encounter medications on file as of 03/23/2022.  :   Review of Systems:  Out of a complete 14 point review of systems, all are reviewed and negative with the exception of these symptoms as listed below:   Review of Systems  Neurological:        Pt here for sleep consult  Pt snores Pt denies headaches,fatigue,hypertension,sleep study,CPAP machine     ESS:11 FSS:22     Objective:  Neurological Exam  Physical Exam Physical Examination:   Vitals:   03/23/22 1031  BP: 135/77  Pulse: (!) 56    General Examination: The patient is a very pleasant 71 y.o. male in no acute distress. He appears well-developed and well-nourished and well groomed.   HEENT: Normocephalic, atraumatic, pupils are equal, round and reactive to light, corrective eyeglasses in place, status post bilateral cataract surgeries.  Extraocular tracking is mildly impaired, mild facial masking noted.  Speech without dysarthria, no carotid bruits.  Airway examination reveals Mallampati class III, small airway entry, redundant soft palate.  Status post tonsillectomy.  Neck circumference of 16 5/8 inches.    Chest:  Clear to auscultation without wheezing, rhonchi or crackles noted.  Heart: S1+S2+0, regular and normal without murmurs, rubs or gallops noted.   Abdomen: Soft, non-tender and non-distended.  Extremities: There is trace pitting edema right distal lower extremity.   Skin: Warm and dry without trophic changes noted.   Musculoskeletal: exam reveals no obvious joint deformities.  He reports right-sided low back pain.  Neurologically:  Mental status: The patient is awake, alert and oriented in all 4 spheres. His immediate and remote memory, attention, language skills and fund of knowledge are fairly appropriate.  He is able to give his history.   Cranial nerves II - XII are as described above under HEENT exam.  Motor exam: Normal bulk, moves all 4 extremities, mild slowness noted.  Intermittent right hand resting tremor.   Fine motor skills and coordination: Mild impairment overall.    Cerebellar testing: No dysmetria or intention tremor. There is no truncal or gait ataxia.  Sensory exam: intact to light touch in the upper and lower extremities.  Gait, station and balance: He stands without difficulty, he walks slowly, no telltale shuffling.  Decreased arm swing noted bilaterally.  Assessment and Plan:  In summary, ARNOL MCGIBBON is a very pleasant 71 y.o.-year old male with an underlying medical history of parkinsonism, memory loss, reflux disease, cervical degenerative disc disease, lumbar degenerative disc disease, history of PE, on Xarelto, history of kidney and bladder stones, and overweight state, whose history and physical exam are concerning for sleep disordered breathing, in particular, obstructive sleep apnea.  I talked to the patient about sleep testing.  We mutually agreed to delay testing until he has had a chance to recuperate after his upcoming lumbar spine surgery which is scheduled for tomorrow.  I talked to him about sleep testing and sleep apnea management, he was given written  instructions as well.  I have asked him to call our office when he is ready to schedule his sleep test.  I will order a sleep study at the time and we will pick up our discussion about sleep apnea management.   I answered all his questions today and he  was in agreement with our approach.  Thank you very much for allowing me to participate in the care of this nice patient. If I can be of any further assistance to you please do not hesitate to call me at [phone].  Sincerely,   Terry Age, MD, PhD

## 2022-03-23 NOTE — Anesthesia Preprocedure Evaluation (Addendum)
Anesthesia Evaluation  Patient identified by MRN, date of birth, ID band Patient awake  General Assessment Comment:Hx noted Dr. Nyoka Cowden  Reviewed: Allergy & Precautions, NPO status , Patient's Chart, lab work & pertinent test results  Airway Mallampati: II       Dental   Pulmonary pneumonia   breath sounds clear to auscultation       Cardiovascular hypertension,  Rhythm:Regular Rate:Normal     Neuro/Psych  PSYCHIATRIC DISORDERS       Neuromuscular disease    GI/Hepatic Neg liver ROS,GERD  ,,  Endo/Other    Renal/GU Renal disease     Musculoskeletal  (+) Arthritis ,    Abdominal   Peds  Hematology   Anesthesia Other Findings   Reproductive/Obstetrics                             Anesthesia Physical Anesthesia Plan  ASA: 3  Anesthesia Plan: General   Post-op Pain Management:    Induction: Intravenous  PONV Risk Score and Plan: 3 and Dexamethasone, Ondansetron and Midazolam  Airway Management Planned: Oral ETT  Additional Equipment:   Intra-op Plan:   Post-operative Plan: Extubation in OR  Informed Consent: I have reviewed the patients History and Physical, chart, labs and discussed the procedure including the risks, benefits and alternatives for the proposed anesthesia with the patient or authorized representative who has indicated his/her understanding and acceptance.     Dental advisory given  Plan Discussed with: CRNA and Anesthesiologist  Anesthesia Plan Comments:         Anesthesia Quick Evaluation

## 2022-03-23 NOTE — Progress Notes (Signed)
patient verbalized understanding of new arrival time of 0530 tomorrow and ERAS until 0430

## 2022-03-24 ENCOUNTER — Inpatient Hospital Stay (HOSPITAL_COMMUNITY): Payer: Medicare Other

## 2022-03-24 ENCOUNTER — Inpatient Hospital Stay (HOSPITAL_COMMUNITY): Payer: Medicare Other | Admitting: Physician Assistant

## 2022-03-24 ENCOUNTER — Observation Stay (HOSPITAL_COMMUNITY)
Admission: RE | Admit: 2022-03-24 | Discharge: 2022-03-25 | Disposition: A | Discharge status: Home / Self Care | Payer: Medicare Other | Source: Ambulatory Visit | Attending: Neurological Surgery | Admitting: Neurological Surgery

## 2022-03-24 ENCOUNTER — Encounter (HOSPITAL_COMMUNITY)
Admission: RE | Disposition: A | Discharge status: Home / Self Care | Payer: Self-pay | Source: Ambulatory Visit | Attending: Neurological Surgery

## 2022-03-24 ENCOUNTER — Other Ambulatory Visit: Payer: Self-pay

## 2022-03-24 DIAGNOSIS — Z981 Arthrodesis status: Secondary | ICD-10-CM | POA: Diagnosis not present

## 2022-03-24 DIAGNOSIS — M5416 Radiculopathy, lumbar region: Secondary | ICD-10-CM | POA: Diagnosis not present

## 2022-03-24 DIAGNOSIS — M48061 Spinal stenosis, lumbar region without neurogenic claudication: Secondary | ICD-10-CM | POA: Diagnosis not present

## 2022-03-24 DIAGNOSIS — Z8546 Personal history of malignant neoplasm of prostate: Secondary | ICD-10-CM | POA: Insufficient documentation

## 2022-03-24 DIAGNOSIS — Z7901 Long term (current) use of anticoagulants: Secondary | ICD-10-CM | POA: Diagnosis not present

## 2022-03-24 DIAGNOSIS — Z79899 Other long term (current) drug therapy: Secondary | ICD-10-CM | POA: Insufficient documentation

## 2022-03-24 DIAGNOSIS — M1990 Unspecified osteoarthritis, unspecified site: Secondary | ICD-10-CM | POA: Diagnosis not present

## 2022-03-24 DIAGNOSIS — M5126 Other intervertebral disc displacement, lumbar region: Secondary | ICD-10-CM | POA: Diagnosis present

## 2022-03-24 DIAGNOSIS — M5116 Intervertebral disc disorders with radiculopathy, lumbar region: Secondary | ICD-10-CM

## 2022-03-24 DIAGNOSIS — M4326 Fusion of spine, lumbar region: Secondary | ICD-10-CM | POA: Diagnosis not present

## 2022-03-24 DIAGNOSIS — I10 Essential (primary) hypertension: Secondary | ICD-10-CM | POA: Diagnosis not present

## 2022-03-24 DIAGNOSIS — N289 Disorder of kidney and ureter, unspecified: Secondary | ICD-10-CM | POA: Diagnosis not present

## 2022-03-24 LAB — BASIC METABOLIC PANEL
Anion gap: 14 (ref 5–15)
BUN: 9 mg/dL (ref 8–23)
CO2: 22 mmol/L (ref 22–32)
Calcium: 9.8 mg/dL (ref 8.9–10.3)
Chloride: 105 mmol/L (ref 98–111)
Creatinine, Ser: 1.03 mg/dL (ref 0.61–1.24)
GFR, Estimated: >60 mL/min (ref >60)
Glucose, Bld: 100 mg/dL — ABNORMAL HIGH (ref 70–99)
Potassium: 3.9 mmol/L (ref 3.5–5.1)
Sodium: 141 mmol/L (ref 135–145)

## 2022-03-24 LAB — CBC
HCT: 47 % (ref 39.0–52.0)
Hemoglobin: 15.9 g/dL (ref 13.0–17.0)
MCH: 29.8 pg (ref 26.0–34.0)
MCHC: 33.8 g/dL (ref 30.0–36.0)
MCV: 88 fL (ref 80.0–100.0)
Platelets: 219 10*3/uL (ref 150–400)
RBC: 5.34 MIL/uL (ref 4.22–5.81)
RDW: 13.1 % (ref 11.5–15.5)
WBC: 6.2 10*3/uL (ref 4.0–10.5)
nRBC: 0 % (ref 0.0–0.2)

## 2022-03-24 LAB — TYPE AND SCREEN
ABO/RH(D): B NEG
Antibody Screen: NEGATIVE

## 2022-03-24 SURGERY — POSTERIOR LUMBAR FUSION 1 WITH HARDWARE REMOVAL
Anesthesia: General | Site: Back

## 2022-03-24 MED ORDER — SENNA 8.6 MG PO TABS
1.0000 | ORAL_TABLET | Freq: Two times a day (BID) | ORAL | Status: DC
  Administered 2022-03-24 – 2022-03-25 (×3): 8.6 mg via ORAL
  Filled 2022-03-24 (×3): qty 1

## 2022-03-24 MED ORDER — ORAL CARE MOUTH RINSE: 15.0000 mL | Freq: Once | OROMUCOSAL | Status: AC

## 2022-03-24 MED ORDER — PHENOL 1.4 % MT LIQD: 1.0000 | OROMUCOSAL | Status: DC | PRN

## 2022-03-24 MED ORDER — CHLORHEXIDINE GLUCONATE CLOTH 2 % EX PADS
6.0000 | MEDICATED_PAD | Freq: Once | CUTANEOUS | Status: AC
  Administered 2022-03-24: 6 via TOPICAL

## 2022-03-24 MED ORDER — DEXAMETHASONE SODIUM PHOSPHATE 10 MG/ML IJ SOLN
INTRAMUSCULAR | Status: DC | PRN
  Administered 2022-03-24: 10 mg via INTRAVENOUS

## 2022-03-24 MED ORDER — FENTANYL CITRATE (PF) 250 MCG/5ML IJ SOLN
INTRAMUSCULAR | Status: DC | PRN
  Administered 2022-03-24 (×6): 50 ug via INTRAVENOUS

## 2022-03-24 MED ORDER — CHLORHEXIDINE GLUCONATE 0.12 % MT SOLN
15.0000 mL | Freq: Once | OROMUCOSAL | Status: AC
  Administered 2022-03-24: 15 mL via OROMUCOSAL

## 2022-03-24 MED ORDER — FENTANYL CITRATE (PF) 250 MCG/5ML IJ SOLN
INTRAMUSCULAR | Status: AC
  Filled 2022-03-24: qty 5

## 2022-03-24 MED ORDER — CARBIDOPA-LEVODOPA 25-100 MG PO TABS
1.0000 | ORAL_TABLET | Freq: Three times a day (TID) | ORAL | Status: DC
  Administered 2022-03-24 – 2022-03-25 (×3): 1 via ORAL
  Filled 2022-03-24 (×3): qty 1

## 2022-03-24 MED ORDER — DOCUSATE SODIUM 100 MG PO CAPS
100.0000 mg | ORAL_CAPSULE | Freq: Two times a day (BID) | ORAL | Status: DC
  Administered 2022-03-24 – 2022-03-25 (×3): 100 mg via ORAL
  Filled 2022-03-24 (×3): qty 1

## 2022-03-24 MED ORDER — DEXAMETHASONE SODIUM PHOSPHATE 10 MG/ML IJ SOLN
INTRAMUSCULAR | Status: AC
  Filled 2022-03-24: qty 1

## 2022-03-24 MED ORDER — ACETAMINOPHEN 500 MG PO TABS: 500.0000 mg | ORAL_TABLET | Freq: Every day | ORAL | Status: DC

## 2022-03-24 MED ORDER — MORPHINE SULFATE (PF) 2 MG/ML IV SOLN: 2.0000 mg | INTRAVENOUS | Status: DC | PRN

## 2022-03-24 MED ORDER — ONDANSETRON HCL 4 MG/2ML IJ SOLN: 4.0000 mg | Freq: Four times a day (QID) | INTRAMUSCULAR | Status: DC | PRN

## 2022-03-24 MED ORDER — ARTIFICIAL TEARS OPHTHALMIC OINT
TOPICAL_OINTMENT | OPHTHALMIC | Status: DC | PRN
  Administered 2022-03-24: 1 via OPHTHALMIC

## 2022-03-24 MED ORDER — SODIUM CHLORIDE 0.9% FLUSH
3.0000 mL | Freq: Two times a day (BID) | INTRAVENOUS | Status: DC
  Administered 2022-03-24: 3 mL via INTRAVENOUS

## 2022-03-24 MED ORDER — LACTATED RINGERS IV SOLN: INTRAVENOUS | Status: DC

## 2022-03-24 MED ORDER — PHENYLEPHRINE HCL-NACL 20-0.9 MG/250ML-% IV SOLN
INTRAVENOUS | Status: DC | PRN
  Administered 2022-03-24: 35 ug/min via INTRAVENOUS

## 2022-03-24 MED ORDER — ROCURONIUM BROMIDE 10 MG/ML (PF) SYRINGE
PREFILLED_SYRINGE | INTRAVENOUS | Status: AC
  Filled 2022-03-24: qty 10

## 2022-03-24 MED ORDER — ONDANSETRON HCL 4 MG/2ML IJ SOLN
INTRAMUSCULAR | Status: AC
  Filled 2022-03-24: qty 2

## 2022-03-24 MED ORDER — PROPOFOL 10 MG/ML IV BOLUS
INTRAVENOUS | Status: DC | PRN
  Administered 2022-03-24: 140 mg via INTRAVENOUS

## 2022-03-24 MED ORDER — HYDROMORPHONE HCL 1 MG/ML IJ SOLN: 0.2500 mg | INTRAMUSCULAR | Status: DC | PRN

## 2022-03-24 MED ORDER — VANCOMYCIN HCL 1000 MG IV SOLR
INTRAVENOUS | Status: DC | PRN
  Administered 2022-03-24: 1000 mg via INTRAVENOUS

## 2022-03-24 MED ORDER — POLYETHYLENE GLYCOL 3350 17 G PO PACK: 17.0000 g | PACK | Freq: Every day | ORAL | Status: DC | PRN

## 2022-03-24 MED ORDER — SODIUM CHLORIDE 0.9 % IV SOLN: 250.0000 mL | INTRAVENOUS | Status: DC

## 2022-03-24 MED ORDER — SUCCINYLCHOLINE CHLORIDE 200 MG/10ML IV SOSY
PREFILLED_SYRINGE | INTRAVENOUS | Status: AC
  Filled 2022-03-24: qty 10

## 2022-03-24 MED ORDER — ACETAMINOPHEN 325 MG PO TABS: 650.0000 mg | ORAL_TABLET | ORAL | Status: DC | PRN

## 2022-03-24 MED ORDER — BUPIVACAINE HCL (PF) 0.5 % IJ SOLN
INTRAMUSCULAR | Status: DC | PRN
  Administered 2022-03-24: 5 mL
  Administered 2022-03-24: 20 mL

## 2022-03-24 MED ORDER — OXYCODONE-ACETAMINOPHEN 5-325 MG PO TABS
1.0000 | ORAL_TABLET | ORAL | Status: DC | PRN
  Administered 2022-03-24 (×2): 2 via ORAL
  Administered 2022-03-25: 1 via ORAL
  Administered 2022-03-25: 2 via ORAL
  Filled 2022-03-24 (×3): qty 2
  Filled 2022-03-24: qty 1

## 2022-03-24 MED ORDER — LORATADINE 10 MG PO TABS
10.0000 mg | ORAL_TABLET | Freq: Every day | ORAL | Status: DC
  Administered 2022-03-24 – 2022-03-25 (×2): 10 mg via ORAL
  Filled 2022-03-24 (×2): qty 1

## 2022-03-24 MED ORDER — LIDOCAINE 2% (20 MG/ML) 5 ML SYRINGE
INTRAMUSCULAR | Status: DC | PRN
  Administered 2022-03-24: 100 mg via INTRAVENOUS

## 2022-03-24 MED ORDER — PROPOFOL 10 MG/ML IV BOLUS
INTRAVENOUS | Status: AC
  Filled 2022-03-24: qty 20

## 2022-03-24 MED ORDER — LIDOCAINE-EPINEPHRINE 1 %-1:100000 IJ SOLN
INTRAMUSCULAR | Status: DC | PRN
  Administered 2022-03-24: 5 mL

## 2022-03-24 MED ORDER — 0.9 % SODIUM CHLORIDE (POUR BTL) OPTIME
TOPICAL | Status: DC | PRN
  Administered 2022-03-24: 1000 mL

## 2022-03-24 MED ORDER — BUPIVACAINE HCL (PF) 0.5 % IJ SOLN
INTRAMUSCULAR | Status: AC
  Filled 2022-03-24: qty 30

## 2022-03-24 MED ORDER — LIDOCAINE 2% (20 MG/ML) 5 ML SYRINGE
INTRAMUSCULAR | Status: AC
  Filled 2022-03-24: qty 5

## 2022-03-24 MED ORDER — BISACODYL 10 MG RE SUPP: 10.0000 mg | Freq: Every day | RECTAL | Status: DC | PRN

## 2022-03-24 MED ORDER — ALUM & MAG HYDROXIDE-SIMETH 200-200-20 MG/5ML PO SUSP: 30.0000 mL | Freq: Four times a day (QID) | ORAL | Status: DC | PRN

## 2022-03-24 MED ORDER — ONDANSETRON HCL 4 MG PO TABS: 4.0000 mg | ORAL_TABLET | Freq: Four times a day (QID) | ORAL | Status: DC | PRN

## 2022-03-24 MED ORDER — CHLORHEXIDINE GLUCONATE CLOTH 2 % EX PADS: 6.0000 | MEDICATED_PAD | Freq: Once | CUTANEOUS | Status: DC

## 2022-03-24 MED ORDER — SUGAMMADEX SODIUM 200 MG/2ML IV SOLN
INTRAVENOUS | Status: DC | PRN
  Administered 2022-03-24: 200 mg via INTRAVENOUS

## 2022-03-24 MED ORDER — POLYVINYL ALCOHOL 1.4 % OP SOLN: 1.0000 [drp] | Freq: Every day | OPHTHALMIC | Status: DC | PRN

## 2022-03-24 MED ORDER — ONDANSETRON HCL 4 MG/2ML IJ SOLN
INTRAMUSCULAR | Status: DC | PRN
  Administered 2022-03-24: 4 mg via INTRAVENOUS

## 2022-03-24 MED ORDER — SODIUM CHLORIDE 0.9% FLUSH: 3.0000 mL | INTRAVENOUS | Status: DC | PRN

## 2022-03-24 MED ORDER — LIDOCAINE-EPINEPHRINE 1 %-1:100000 IJ SOLN
INTRAMUSCULAR | Status: AC
  Filled 2022-03-24: qty 1

## 2022-03-24 MED ORDER — ROCURONIUM BROMIDE 10 MG/ML (PF) SYRINGE
PREFILLED_SYRINGE | INTRAVENOUS | Status: DC | PRN
  Administered 2022-03-24: 40 mg via INTRAVENOUS
  Administered 2022-03-24: 30 mg via INTRAVENOUS
  Administered 2022-03-24: 60 mg via INTRAVENOUS
  Administered 2022-03-24: 20 mg via INTRAVENOUS

## 2022-03-24 MED ORDER — VANCOMYCIN HCL IN DEXTROSE 1-5 GM/200ML-% IV SOLN
1000.0000 mg | Freq: Once | INTRAVENOUS | Status: AC
  Administered 2022-03-24: 1000 mg via INTRAVENOUS
  Filled 2022-03-24: qty 200

## 2022-03-24 MED ORDER — METHOCARBAMOL 1000 MG/10ML IJ SOLN
500.0000 mg | Freq: Four times a day (QID) | INTRAVENOUS | Status: DC | PRN
  Filled 2022-03-24: qty 5

## 2022-03-24 MED ORDER — ACETAMINOPHEN 650 MG RE SUPP: 650.0000 mg | RECTAL | Status: DC | PRN

## 2022-03-24 MED ORDER — MENTHOL 3 MG MT LOZG: 1.0000 | LOZENGE | OROMUCOSAL | Status: DC | PRN

## 2022-03-24 MED ORDER — LACTATED RINGERS IV SOLN: INTRAVENOUS | Status: DC | PRN

## 2022-03-24 MED ORDER — THROMBIN 5000 UNITS EX SOLR
OROMUCOSAL | Status: DC | PRN
  Administered 2022-03-24: 5 mL via TOPICAL

## 2022-03-24 MED ORDER — PANTOPRAZOLE SODIUM 20 MG PO TBEC
20.0000 mg | DELAYED_RELEASE_TABLET | Freq: Every day | ORAL | Status: DC
  Administered 2022-03-25: 20 mg via ORAL
  Filled 2022-03-24: qty 1

## 2022-03-24 MED ORDER — EPHEDRINE SULFATE-NACL 50-0.9 MG/10ML-% IV SOSY
PREFILLED_SYRINGE | INTRAVENOUS | Status: DC | PRN
  Administered 2022-03-24 (×2): 5 mg via INTRAVENOUS

## 2022-03-24 MED ORDER — DONEPEZIL HCL 10 MG PO TABS
10.0000 mg | ORAL_TABLET | Freq: Every day | ORAL | Status: DC
  Administered 2022-03-24: 10 mg via ORAL
  Filled 2022-03-24: qty 1

## 2022-03-24 MED ORDER — THROMBIN 5000 UNITS EX SOLR
CUTANEOUS | Status: AC
  Filled 2022-03-24: qty 5000

## 2022-03-24 MED ORDER — VANCOMYCIN HCL IN DEXTROSE 1-5 GM/200ML-% IV SOLN
1000.0000 mg | INTRAVENOUS | Status: AC
  Administered 2022-03-24: 1000 mg via INTRAVENOUS
  Filled 2022-03-24: qty 200

## 2022-03-24 MED ORDER — FLEET ENEMA 7-19 GM/118ML RE ENEM: 1.0000 | ENEMA | Freq: Once | RECTAL | Status: DC | PRN

## 2022-03-24 MED ORDER — METHOCARBAMOL 500 MG PO TABS
500.0000 mg | ORAL_TABLET | Freq: Four times a day (QID) | ORAL | Status: DC | PRN
  Administered 2022-03-25: 500 mg via ORAL
  Filled 2022-03-24: qty 1

## 2022-03-24 MED ORDER — MELATONIN 3 MG PO TABS: 3.0000 mg | ORAL_TABLET | Freq: Every evening | ORAL | Status: DC | PRN

## 2022-03-24 SURGICAL SUPPLY — 64 items
BAG COUNTER SPONGE SURGICOUNT (BAG) ×1 IMPLANT
BASKET BONE COLLECTION (BASKET) ×1 IMPLANT
BLADE BONE MILL MEDIUM (MISCELLANEOUS) ×1 IMPLANT
BLADE CLIPPER SURG (BLADE) IMPLANT
BONE CANC CHIPS 20CC PCAN1/4 (Bone Implant) ×1 IMPLANT
BUR CROSS CUT FISSURE 1.2 (BURR) IMPLANT
BUR MATCHSTICK NEURO 3.0 LAGG (BURR) ×1 IMPLANT
CAGE COROENT MP 8X23 (Cage) IMPLANT
CANISTER SUCT 3000ML PPV (MISCELLANEOUS) ×1 IMPLANT
CHIPS CANC BONE 20CC PCAN1/4 (Bone Implant) ×1 IMPLANT
CNTNR URN SCR LID CUP LEK RST (MISCELLANEOUS) ×1 IMPLANT
CONT SPEC 4OZ STRL OR WHT (MISCELLANEOUS) ×1
DERMABOND ADVANCED .7 DNX12 (GAUZE/BANDAGES/DRESSINGS) ×1 IMPLANT
DEVICE DISSECT PLASMABLAD 3.0S (MISCELLANEOUS) ×1 IMPLANT
DRAPE C-ARM 42X72 X-RAY (DRAPES) ×2 IMPLANT
DRAPE HALF SHEET 40X57 (DRAPES) IMPLANT
DRAPE LAPAROTOMY 100X72X124 (DRAPES) ×1 IMPLANT
DRSG OPSITE POSTOP 4X6 (GAUZE/BANDAGES/DRESSINGS) IMPLANT
DURAPREP 26ML APPLICATOR (WOUND CARE) ×1 IMPLANT
DURASEAL APPLICATOR TIP (TIP) IMPLANT
DURASEAL SPINE SEALANT 3ML (MISCELLANEOUS) IMPLANT
ELECT REM PT RETURN 9FT ADLT (ELECTROSURGICAL) ×1
ELECTRODE REM PT RTRN 9FT ADLT (ELECTROSURGICAL) ×1 IMPLANT
GAUZE 4X4 16PLY ~~LOC~~+RFID DBL (SPONGE) IMPLANT
GAUZE SPONGE 4X4 12PLY STRL (GAUZE/BANDAGES/DRESSINGS) ×1 IMPLANT
GLOVE BIOGEL PI IND STRL 8.5 (GLOVE) ×2 IMPLANT
GLOVE ECLIPSE 8.5 STRL (GLOVE) ×2 IMPLANT
GOWN STRL REUS W/ TWL LRG LVL3 (GOWN DISPOSABLE) IMPLANT
GOWN STRL REUS W/ TWL XL LVL3 (GOWN DISPOSABLE) IMPLANT
GOWN STRL REUS W/TWL 2XL LVL3 (GOWN DISPOSABLE) ×2 IMPLANT
GOWN STRL REUS W/TWL LRG LVL3 (GOWN DISPOSABLE) ×2
GOWN STRL REUS W/TWL XL LVL3 (GOWN DISPOSABLE)
GRAFT BNE CANC CHIPS 1-8 20CC (Bone Implant) IMPLANT
GRAFT BONE PROTEIOS LRG 5CC (Orthopedic Implant) IMPLANT
HEMOSTAT POWDER KIT SURGIFOAM (HEMOSTASIS) IMPLANT
KIT BASIN OR (CUSTOM PROCEDURE TRAY) ×1 IMPLANT
KIT GRAFTMAG DEL NEURO DISP (NEUROSURGERY SUPPLIES) IMPLANT
KIT TURNOVER KIT B (KITS) ×1 IMPLANT
MILL BONE PREP (MISCELLANEOUS) ×1 IMPLANT
NEEDLE HYPO 22GX1.5 SAFETY (NEEDLE) ×1 IMPLANT
NS IRRIG 1000ML POUR BTL (IV SOLUTION) ×1 IMPLANT
PACK LAMINECTOMY NEURO (CUSTOM PROCEDURE TRAY) ×1 IMPLANT
PAD ARMBOARD 7.5X6 YLW CONV (MISCELLANEOUS) ×3 IMPLANT
PATTIES SURGICAL .5 X1 (DISPOSABLE) ×1 IMPLANT
PLASMABLADE 3.0S (MISCELLANEOUS) ×1
PLATE AFFIX 45MM (Plate) IMPLANT
ROD RELINE 0-0 CON M 5.0/6.0MM (Rod) IMPLANT
ROD RELINE TI LATERAL MED OFF (Rod) IMPLANT
SCREW LOCK RELINE 5.5 TULIP (Screw) IMPLANT
SCREW RELINE-O POLY 6.5X45 (Screw) IMPLANT
SPIKE FLUID TRANSFER (MISCELLANEOUS) ×1 IMPLANT
SPONGE SURGIFOAM ABS GEL 100 (HEMOSTASIS) ×1 IMPLANT
SPONGE T-LAP 4X18 ~~LOC~~+RFID (SPONGE) IMPLANT
SUT PROLENE 6 0 BV (SUTURE) IMPLANT
SUT VIC AB 1 CT1 18XBRD ANBCTR (SUTURE) ×1 IMPLANT
SUT VIC AB 1 CT1 8-18 (SUTURE) ×1
SUT VIC AB 2-0 CP2 18 (SUTURE) ×1 IMPLANT
SUT VIC AB 3-0 SH 8-18 (SUTURE) ×1 IMPLANT
SUT VIC AB 4-0 RB1 18 (SUTURE) ×1 IMPLANT
SYR 3ML LL SCALE MARK (SYRINGE) ×4 IMPLANT
TOWEL GREEN STERILE (TOWEL DISPOSABLE) ×1 IMPLANT
TOWEL GREEN STERILE FF (TOWEL DISPOSABLE) ×1 IMPLANT
TRAY FOLEY MTR SLVR 16FR STAT (SET/KITS/TRAYS/PACK) ×1 IMPLANT
WATER STERILE IRR 1000ML POUR (IV SOLUTION) ×1 IMPLANT

## 2022-03-24 NOTE — Op Note (Signed)
Date of surgery: 03/24/2022 Preoperative diagnosis: Herniated nucleus pulposus L2-L3 with lumbar spinal stenosis lumbar radiculopathy history of fusion L3-L5 Postoperative diagnosis: Same Procedure: Laminectomy L2-L3 with posterior lumbar interbody arthrodesis L2-L3 posterolateral arthrodesis with local autograft allograft and proteos.  Pedicle screw fixation L2 to previous hardware from L3-L5.  Interspinous clamp fixation L2-3. Surgeon: Kristeen Miss Anesthesia: General endotracheal Indications: Terry [name] is a 71 year old individual whose had significant back and bilateral lower extremity pain he has particularly had right leg weakness and this was secondary to a large herniated nucleus pulposus at the L2-3 level on the right side.  An MRI demonstrated the changes that it happens since his last surgery fixing L3-4 and L4-5 he has been advised regarding the need for surgery to decompress and stabilize L2-3.  Procedure: Patient was brought to the operating room supine on the stretcher.  After the smooth induction of general endotracheal anesthesia, he was carefully turned prone.  The back was prepped with alcohol DuraPrep and draped in a sterile fashion.  Midline incision was created and carried down to the lumbodorsal fascia which was opened on either side of midline.  Spinous processes were identified and the dissection was carried down to the paravertebral tissues at L2-3.  The L3 hardware was identified.  This was confirmed with a radiograph and the dissection was carried out superiorly to expose the transverse process of L2 laminar arch and the pars section at L2.  Then inferior to the L3 screws a portion of the rod was cleared so that a W clamp could be attached to the rod to allow extension of the fixation cephalad.  With the W clamp being placed and secured loosely laminotomies were created removing the inferior margin lamina of L2 out to and including the entirety of the facet at L2-3 the bone was  harvested for use and bone graft and then the laminectomy site was cleared of all the cartilaginous attachments and the ligamentous material was then opened and removed there is a substantial thickening of the L ligament on both sides and once this was removed to allow for good central decompression of the spinal canal.  In the lateral recess on the right side was open and here was identified a substantial amount of markedly degenerated disc material that had herniated self into the epidural space along the path of the L2 and the L3 nerve roots inferiorly was carefully dissected and removed in a piecemeal fashion the disc base was then identified and after some prodding the disc space could be opened slightly this allowed placement of a shaver within the disc space that measured 6 mm in height the disc in this area was removed with a combination of curettes and rongeurs and using this shaver ultimately a 7 mm interbody spacer could be placed the interspinous ligament was splayed and the interspinous space was then cleared to allow placement of the interlaminar spreader to allow opening the interspace more once the common dural tube the L2 and L3 nerve roots were well decompressed in this lateral recess on the right side attention was turned to the left side where similar process was completed the disc space here was entered there is no herniated material but there was a substantial amount of thickening of the ligament secondary to degenerated disc within the disc space at L2-3 the disc base was cleared completely of the disc material both medially and laterally and ultimately an 8 mm spacer was able to be placed in the space then the  opposite side was decorticated further using a toothed curette and a combination of pituitary rongeurs and 2 and 3 mm Kerrison punches to remove all the disc material in the lateral periphery once the interspace was completely dissected and the disc was removed the interspace was sized  for an appropriate size spacer was felt that an 8 x 4 x 23 mm spacer with 4 degrees of lordosis would fit best into this interval this was packed with a combination of autograft allograft and Proteus and 12 cc of Proteus was then packed into the interspace.  This was along with the 2 cages.  The intertransverse space at L2-3 was then cleared an additional 6 cc of bone graft was packed into either lateral recess pedicle entry sites were chosen at L2 and these were confirmed with fluoroscopy and ultimately 6.5 x 45 mm screws were placed and L2 Z rods were then cut to the appropriate length and placed in the rests between the L2 and the L3-5 fixation with a W attachments being readily placed.  These were then tightened down in a neutral construct.  This left a significant gap in the interspinous space for this reason an interspinous clamp was applied and this allowed a place to place bone graft in the interspinous space after decorticating the inferior portion of the spinous process of L2 and the superior portion of the spinous process of L3 this was applied and torqued into its final resting position final radiographs were obtained demonstrating good decompression of the L2-L3 space.  The area was then checked for hemostasis and infiltrated with 25 cc of half percent Marcaine the lumbar dorsal fascia was then reapproximated with #1 Vicryl 2-0 Vicryl was used in subcutaneous tissues 3-0 Vicryl subcuticularly and 4-0 Vicryl in the final subcuticular layer.  Dermabond was placed on the skin and a honeycomb dressing was placed over this.  Blood loss was estimated 250 cc and 90 cc of Cell Saver blood was returned to the patient.

## 2022-03-24 NOTE — Anesthesia Procedure Notes (Signed)
Procedure Name: Intubation Date/Time: 03/24/2022 7:58 AM  Performed by: Lavell Luster, CRNAPre-anesthesia Checklist: Patient identified, Emergency Drugs available, Suction available, Patient being monitored and Timeout performed Patient Re-evaluated:Patient Re-evaluated prior to induction Oxygen Delivery Method: Circle system utilized Preoxygenation: Pre-oxygenation with 100% oxygen Induction Type: IV induction Ventilation: Oral airway inserted - appropriate to patient size and Mask ventilation without difficulty Laryngoscope Size: Mac and 4 Grade View: Grade II Tube type: Oral Tube size: 7.5 mm Number of attempts: 1 Airway Equipment and Method: Stylet Placement Confirmation: ETT inserted through vocal cords under direct vision, positive ETCO2 and breath sounds checked- equal and bilateral Secured at: 22 cm Tube secured with: Tape Dental Injury: Teeth and Oropharynx as per pre-operative assessment

## 2022-03-24 NOTE — Anesthesia Postprocedure Evaluation (Signed)
Anesthesia Post Note  Patient: Terry [name]  Procedure(s) Performed: Lumbar Two - Three Posterior Lumbar Interbody Fusion (Back)     Patient location during evaluation: PACU Anesthesia Type: General Level of consciousness: awake Pain management: pain level controlled Vital Signs Assessment: post-procedure vital signs reviewed and stable Respiratory status: spontaneous breathing Cardiovascular status: stable Postop Assessment: no apparent nausea or vomiting Anesthetic complications: no   No notable events documented.  Last Vitals:  Vitals:   03/24/22 1235 03/24/22 1323  BP: (!) 157/85 (!) 162/82  Pulse: 88 91  Resp: 16 17  Temp: 36.9 C 36.7 C  SpO2: 95% 97%    Last Pain:  Vitals:   03/24/22 1205  TempSrc:   PainSc: 0-No pain                 Jamaria Amborn

## 2022-03-24 NOTE — Transfer of Care (Signed)
Immediate Anesthesia Transfer of Care Note  Patient: Terry [name]  Procedure(s) Performed: Lumbar Two - Three Posterior Lumbar Interbody Fusion (Back)  Patient Location: PACU  Anesthesia Type:General  Level of Consciousness: awake, alert  and oriented  Airway & Oxygen Therapy: Patient connected to face mask oxygen  Post-op Assessment: Post -op Vital signs reviewed and stable  Post vital signs: stable  Last Vitals:  Vitals Value Taken Time  BP 161/83 03/24/22 1205  Temp    Pulse 78 03/24/22 1206  Resp 12 03/24/22 1206  SpO2 100 % 03/24/22 1206  Vitals shown include unvalidated device data.  Last Pain:  Vitals:   03/24/22 0649  TempSrc:   PainSc: 0-No pain         Complications: No notable events documented.

## 2022-03-24 NOTE — H&P (Signed)
Terry [name] is an 72 y.o. male.   Chief Complaint: Right anterior thigh pain and back pain history of fusion L3-L5 HPI: Terry [name] is a 71 year old individual whose had a history of the degenerative changes and lumbar spinal stenosis initially at L3-4 then at L4-5 he underwent decompression and fusion at L3-4 initially and subsequently required surgery at L4-5.  Patient had been doing reasonably well until this past summer when he developed severe pain in the right lower extremity particularly in the anterior thigh he was found to have a significant extrusion of disc at L2-3 he has been advised regarding surgical decompression spondee conservative efforts he has weakness in the right anterior thigh additionally the patient has a history of some recent onset of Parkinson's disease addition to other arthritides.  He is now being admitted to undergo surgical decompression and stabilization of L2-3 he has some modest disc degenerative changes at L1 to but we had discussed whether surgery should be considered here and I advised that we should hold off as surgery at L1-2 will likely require stabilization across the thoracolumbar junction to the level of T10 like to avoid that if at all possible.  Past Medical History:  Diagnosis Date   Arthritis    neck, back, knees, right ankle   Bladder calculus    Chronic back pain    GERD (gastroesophageal reflux disease)    History of colon polyps    benign   History of kidney stones    and bladder stone   History of prostate cancer urologist-- dr Alinda Money   dx 2014---  s/p  prostatectomy 05-22-2013 ,  Gleason 3+3   Hypercholesterolemia    takes Crestor daily   Hyperlipidemia    Pneumonia 10/2019   Pulmonary emboli ([place]) 10/2019   Wears glasses     Past Surgical History:  Procedure Laterality Date   ACHILLES TENDON SURGERY Right 06-07-2018   '@[facility]'$    reconstruction   ANTERIOR CERVICAL DECOMP/DISCECTOMY FUSION N/A 06/27/2014   Procedure: ANTERIOR  CERVICAL DECOMPRESSION/DISCECTOMY FUSION C5-C7   (2 LEVELS);  Surgeon: Melina Schools, MD;  Location: [place];  Service: Orthopedics;  Laterality: N/A;   CARDIAC CATHETERIZATION  2004   COLONOSCOPY     CYSTOSCOPY WITH LITHOLAPAXY N/A 03/27/2019   Procedure: CYSTOSCOPY WITH REMOVAL OF BLADDER STONE AND FOREIGN BODY;  Surgeon: Raynelle Bring, MD;  Location: [facility];  Service: Urology;  Laterality: N/A;   EYE SURGERY     lazy eye as child, lasik surgery   FINGER SURGERY Right    4th finger   I & D EXTREMITY Right 07/10/2018   Procedure: IRRIGATION AND DEBRIDEMENT RIGHT ANKLE WOUND AND WOUND VAC PLACEMENT;  Surgeon: Wylene Simmer, MD;  Location: [place];  Service: Orthopedics;  Laterality: Right;   KNEE ARTHROSCOPY Bilateral right ?;  left 05-25-2007  '@[facility]'$    POSTERIOR LAMINECTOMY / DECOMPRESSION LUMBAR SPINE  08-31-2016    dr Arlyss Weathersby  '@[facility]'$    PROSTATE BIOPSY  04/13/13   gleason 3+3=6, vol 55.4 cc   ROBOT ASSISTED LAPAROSCOPIC RADICAL PROSTATECTOMY N/A 05/22/2013   Procedure: ROBOTIC ASSISTED LAPAROSCOPIC RADICAL PROSTATECTOMY LEVEL 1;  Surgeon: Dutch Gray, MD;  Location: [facility];  Service: Urology;  Laterality: N/A;   TONSILLECTOMY      Family History  Problem Relation Age of Onset   Pneumonia Mother        bacterial   Dementia Mother    Heart disease Father    Heart attack Father  Colon cancer Neg Hx    Sleep apnea Neg Hx    Social History:  reports that he has never smoked. He has never used smokeless tobacco. He reports current alcohol use. He reports that he does not use drugs.  Allergies:  Allergies  Allergen Reactions   Lipitor [Atorvastatin] Other (See Comments)    Muscle issues   Penicillins Hives, Itching and Other (See Comments)    Has patient had a PCN reaction causing immediate rash, facial/tongue/throat swelling, SOB or lightheadedness with hypotension: No Has patient had a PCN reaction causing SEVERE RASH INVOLVING MUCUS MEMBRANES or SKIN NECROSIS: #  #  #   YES  #  #  #  Has patient had a PCN reaction that required hospitalization No Has patient had a PCN reaction occurring within the last 10 years: No     Medications Prior to Admission  Medication Sig Dispense Refill   acetaminophen (TYLENOL) 500 MG tablet Take 500 mg by mouth daily.     carbidopa-levodopa (SINEMET IR) 25-100 MG tablet Take 1 tablet by mouth 3 (three) times daily. 270 tablet 3   cetirizine (ZYRTEC) 10 MG tablet Take 10 mg by mouth daily as needed for allergies.     cyanocobalamin (VITAMIN B12) 1000 MCG tablet Take 1,000 mcg by mouth daily.     diclofenac Sodium (VOLTAREN) 1 % GEL Apply 2 g topically daily as needed (lower back pain).     donepezil (ARICEPT) 10 MG tablet Take 1 tablet (10 mg total) by mouth at bedtime. 90 tablet 3   MELATONIN PO Take 2 tablets by mouth at bedtime.     omeprazole (PRILOSEC OTC) 20 MG tablet Take 20 mg by mouth every morning.     Polyvinyl Alcohol-Povidone PF (REFRESH) 1.4-0.6 % SOLN Place 1 drop into both eyes daily as needed (luberication and dry eyes).     rivaroxaban (XARELTO) 20 MG TABS tablet Take 20 mg by mouth daily with supper.      Results for orders placed or performed during the hospital encounter of 03/24/22 (from the past 48 hour(s))  Type and screen     Status: None (Preliminary result)   Collection Time: 03/24/22  6:35 AM  Result Value Ref Range   ABO/RH(D) PENDING    Antibody Screen PENDING    Sample Expiration      03/27/2022,2359 Performed at [facility], [place] [address] [place], [place] [address]   Basic metabolic panel per protocol     Status: Abnormal   Collection Time: 03/24/22  6:45 AM  Result Value Ref Range   Sodium 141 135 - 145 mmol/L   Potassium 3.9 3.5 - 5.1 mmol/L   Chloride 105 98 - 111 mmol/L   CO2 22 22 - 32 mmol/L   Glucose, Bld 100 (H) 70 - 99 mg/dL    Comment: Glucose reference range applies only to samples taken after fasting for at least 8 hours.   BUN 9 8 - 23 mg/dL   Creatinine, Ser  1.03 0.61 - 1.24 mg/dL   Calcium 9.8 8.9 - 10.3 mg/dL   GFR, Estimated >60 >60 mL/min    Comment: (NOTE) Calculated using the CKD-EPI Creatinine Equation (2021)    Anion gap 14 5 - 15    Comment: Performed at [facility] [address], [place], [place] [address]  CBC per protocol     Status: None   Collection Time: 03/24/22  6:45 AM  Result Value Ref Range  WBC 6.2 4.0 - 10.5 K/uL   RBC 5.34 4.22 - 5.81 MIL/uL   Hemoglobin 15.9 13.0 - 17.0 g/dL   HCT 47.0 39.0 - 52.0 %   MCV 88.0 80.0 - 100.0 fL   MCH 29.8 26.0 - 34.0 pg   MCHC 33.8 30.0 - 36.0 g/dL   RDW 13.1 11.5 - 15.5 %   Platelets 219 150 - 400 K/uL   nRBC 0.0 0.0 - 0.2 %    Comment: Performed at [facility], [place] [address], [place], [place] [address]   No results found.  Review of Systems  Constitutional:  Positive for activity change.  Musculoskeletal:  Positive for back pain and myalgias.  Neurological:  Positive for weakness and numbness.  All other systems reviewed and are negative.   Blood pressure 139/70, pulse 62, temperature 98.8 F (37.1 C), temperature source Oral, resp. rate 18, height '5\' 8"'$  (1.727 m), weight 78 kg, SpO2 96 %. Physical Exam Constitutional:      Appearance: Normal appearance. He is normal weight.  HENT:     Head: Normocephalic and atraumatic.     Right Ear: Tympanic membrane, ear canal and external ear normal.     Left Ear: Tympanic membrane, ear canal and external ear normal.     Nose: Nose normal.     Mouth/Throat:     Mouth: Mucous membranes are moist.     Pharynx: Oropharynx is clear.  Eyes:     Extraocular Movements: Extraocular movements intact.     Conjunctiva/sclera: Conjunctivae normal.     Pupils: Pupils are equal, round, and reactive to light.  Cardiovascular:     Rate and Rhythm: Normal rate and regular rhythm.  Pulmonary:     Effort: Pulmonary effort is normal.     Breath sounds: Normal breath sounds.  Abdominal:     General: Abdomen is flat.      Palpations: Abdomen is soft.  Musculoskeletal:        General: Normal range of motion.     Cervical back: Normal range of motion and neck supple.  Skin:    General: Skin is warm and dry.     Capillary Refill: Capillary refill Neurological:     General: No focal deficit present.     Mental Status: He is alert.     Comments: Weakness in the right quadriceps and iliopsoas at 4 out of 5.  Absent reflexes in the patellae and the Achilles both.  Psychiatric:        Mood and Affect: Mood normal.        Behavior: Behavior normal.        Thought Content: Thought content normal.        Judgment: Judgment normal.      Assessment/Plan Spondylosis with stenosis and herniated nucleus pulposus L2-L3 history of fusion L3-L5.  Lumbar radiculopathy on the right.  Plan: Decompression and fusion via posterior lumbar interbody arthrodesis at L2-L3 extension of arthrodesis to L2.    Earleen Newport, MD 03/24/2022, 7:41 AM

## 2022-03-24 NOTE — Progress Notes (Addendum)
Swabbed patient's nose with Profend then realized I did not get the PCR swab sent.  Patient treated with Profend but PCR surgical swab not obtained.   Did not apply sacral foam dressing, contraindicated.

## 2022-03-24 NOTE — Progress Notes (Signed)
Orthopedic Tech Progress Note Patient Details:  Terry [name] [birth year] [id number]  RN stated "wife has back brace"   Patient ID: Terry [name], male   DOB: [birth year], 71 y.o.   MRN: [id number]  Janit Pagan 03/24/2022, 1:25 PM

## 2022-03-24 NOTE — Progress Notes (Signed)
Patient ID: Terry [name], male   DOB: [birth year], 71 y.o.   MRN: [id number] Vital signs are stable Motor function is intact Mobilize as tolerated when more alert Transfer to 3 central for postop care

## 2022-03-25 DIAGNOSIS — Z8546 Personal history of malignant neoplasm of prostate: Secondary | ICD-10-CM | POA: Diagnosis not present

## 2022-03-25 DIAGNOSIS — M5416 Radiculopathy, lumbar region: Secondary | ICD-10-CM | POA: Diagnosis not present

## 2022-03-25 DIAGNOSIS — Z7901 Long term (current) use of anticoagulants: Secondary | ICD-10-CM | POA: Diagnosis not present

## 2022-03-25 DIAGNOSIS — M5126 Other intervertebral disc displacement, lumbar region: Secondary | ICD-10-CM | POA: Diagnosis not present

## 2022-03-25 DIAGNOSIS — M48061 Spinal stenosis, lumbar region without neurogenic claudication: Secondary | ICD-10-CM | POA: Diagnosis not present

## 2022-03-25 DIAGNOSIS — Z79899 Other long term (current) drug therapy: Secondary | ICD-10-CM | POA: Diagnosis not present

## 2022-03-25 LAB — BASIC METABOLIC PANEL
Anion gap: 15 (ref 5–15)
BUN: 14 mg/dL (ref 8–23)
CO2: 20 mmol/L — ABNORMAL LOW (ref 22–32)
Calcium: 9.5 mg/dL (ref 8.9–10.3)
Chloride: 102 mmol/L (ref 98–111)
Creatinine, Ser: 1.19 mg/dL (ref 0.61–1.24)
GFR, Estimated: >60 mL/min (ref >60)
Glucose, Bld: 159 mg/dL — ABNORMAL HIGH (ref 70–99)
Potassium: 4.5 mmol/L (ref 3.5–5.1)
Sodium: 137 mmol/L (ref 135–145)

## 2022-03-25 LAB — CBC
HCT: 45.3 % (ref 39.0–52.0)
Hemoglobin: 15.1 g/dL (ref 13.0–17.0)
MCH: 29.1 pg (ref 26.0–34.0)
MCHC: 33.3 g/dL (ref 30.0–36.0)
MCV: 87.3 fL (ref 80.0–100.0)
Platelets: 282 10*3/uL (ref 150–400)
RBC: 5.19 MIL/uL (ref 4.22–5.81)
RDW: 13.2 % (ref 11.5–15.5)
WBC: 15.9 10*3/uL — ABNORMAL HIGH (ref 4.0–10.5)
nRBC: 0 % (ref 0.0–0.2)

## 2022-03-25 MED ORDER — OXYCODONE-ACETAMINOPHEN 5-325 MG PO TABS: 1.0000 | ORAL_TABLET | Freq: Four times a day (QID) | ORAL | 0 refills | Status: DC | PRN

## 2022-03-25 MED ORDER — METHOCARBAMOL 500 MG PO TABS: 500.0000 mg | ORAL_TABLET | Freq: Four times a day (QID) | ORAL | 3 refills | Status: DC | PRN

## 2022-03-25 NOTE — Plan of Care (Signed)
Pt doing well. Pt and wife given D/C instructions with verbal understanding. Rx's were sent to the pharmacy by MD. Pt's incision is clean and dry with no sign of infection. Pt's IV was removed prior to D/C. Pt D/C'd home via wheelchair per MD order. Pt is stable @ D/C and has no other needs at this time. Jeraline Marcinek, RN  

## 2022-03-25 NOTE — Evaluation (Signed)
Occupational Therapy Evaluation Patient Details Name: Terry [name] MRN: [id number] DOB: 11/30/1950 Today's Date: 03/25/2022   History of Present Illness Patient is a 71 yo male presenting to hospital for L2-L3 fusion on 03/24/22. PMH includes: L3-L5 fusion, Parkinsons, prostate CA, HLD, hay fever, GERD, chronic back pain, asthma, R achilles tendon repair   Clinical Impression   Prior to this admission, patient living with his wife and reports independence in mobility and ADLs. Currently, patient presenting with cognitive deficits and decreased activity tolerance.  Patient with recent diagnosis of Parkinson's with noted difficulty in orientation, recalling back precautions despite previous back surgeries, impairments in short term memory, and praxis difficulties noted with dressing requiring min A. Due to cognitive deficits, patient will require 24/7 supervision for safety, adherence to back precautions, and with basic ADLs. OT not recommending any OT follow up at this time. OT signing off, please re-consult if further acute needs arise.     Recommendations for follow up therapy are one component of a multi-disciplinary discharge planning process, led by the attending physician.  Recommendations may be updated based on patient status, additional functional criteria and insurance authorization.   Follow Up Recommendations  No OT follow up    Assistance Recommended at Discharge Frequent or constant Supervision/Assistance (24/7 supervision due to cognition)  Patient can return home with the following A little help with bathing/dressing/bathroom;Direct supervision/assist for medications management;Direct supervision/assist for financial management;Assist for transportation;Help with stairs or ramp for entrance;Assistance with cooking/housework    Functional Status Assessment  Patient has had a recent decline in their functional status and demonstrates the ability to make significant improvements  in function in a reasonable and predictable amount of time.  Equipment Recommendations  None recommended by OT    Recommendations for Other Services       Precautions / Restrictions Precautions Precautions: Back Precaution Booklet Issued: Yes (comment) Precaution Comments: Patient unable to state precautions however good adherence throughout once prompted, handout provided at end of session Required Braces or Orthoses: Spinal Brace Spinal Brace: Lumbar corset;Applied in sitting position (Patient does not have brace, wife is bringing at discharge, MD approved movement without brace for time being) Restrictions Weight Bearing Restrictions: No      Mobility Bed Mobility Overal bed mobility: Independent             General bed mobility comments: laying sideways in bed upon OT arrival, then transitioning into sitting with adherence to precautions as OT entered    Transfers Overall transfer level: Needs assistance Equipment used: Rolling walker (2 wheels) Transfers: Sit to/from Stand Sit to Stand: Supervision           General transfer comment: supervision for transfers with RW, patient found in bathroom after OT exit without RW therefore min gaurd without AD      Balance Overall balance assessment: Mild deficits observed, not formally tested                                         ADL either performed or assessed with clinical judgement   ADL Overall ADL's : Needs assistance/impaired Eating/Feeding: Set up;Sitting   Grooming: Set up;Standing   Upper Body Bathing: Set up;Sitting   Lower Body Bathing: Minimal assistance;Sitting/lateral leans;Sit to/from stand   Upper Body Dressing : Set up   Lower Body Dressing: Minimal assistance Lower Body Dressing Details (indicate cue type and reason): min A  for sequencing, able to complete a figure four for socks and shoes Toilet Transfer: Min guard;Ambulation           Functional mobility during  ADLs: Minimal assistance;Rolling walker (2 wheels);Cueing for sequencing;Cueing for safety General ADL Comments: Patient presenting with cognitive deficits and decreased activity tolerance     Vision Baseline Vision/History: 1 Wears glasses;4 Cataracts Ability to See in Adequate Light: 0 Adequate Patient Visual Report: No change from baseline       Perception     Praxis Praxis Praxis-Other Comments: assistance with aspects of dressing, holding gripper sock in one hand, and about to don gripper sock on top of gripper sock on R foot instead of donning his own socks and shoes    Pertinent Vitals/Pain Pain Assessment Pain Assessment: No/denies pain     Hand Dominance     Extremity/Trunk Assessment Upper Extremity Assessment Upper Extremity Assessment: Generalized weakness   Lower Extremity Assessment Lower Extremity Assessment: Defer to PT evaluation   Cervical / Trunk Assessment Cervical / Trunk Assessment: Back Surgery   Communication Communication Communication: No difficulties   Cognition Arousal/Alertness: Awake/alert Behavior During Therapy: Impulsive, Restless Overall Cognitive Status: Impaired/Different from baseline Area of Impairment: Orientation, Attention, Memory, Following commands, Safety/judgement, Awareness, Problem solving                 Orientation Level: Place, Time Current Attention Level: Sustained Memory: Decreased recall of precautions, Decreased short-term memory Following Commands: Follows one step commands with increased time, Follows multi-step commands inconsistently Safety/Judgement: Decreased awareness of safety, Decreased awareness of deficits Awareness: Emergent Problem Solving: Slow processing, Decreased initiation, Difficulty sequencing, Requires verbal cues, Requires tactile cues General Comments: Patient with recent diagnosis of Parkinsons, stating it was 2020 when asked the year, and telling OT the date when asking patient where  he was located, difficulty with basic sequencing of dressing throughout     General Comments       Exercises     Shoulder Instructions      Home Living Family/patient expects to be discharged to:: Private residence Living Arrangements: Spouse/significant other Available Help at Discharge: Family;Friend(s);Available 24 hours/day Type of Home: House Home Access: Stairs to enter [employer] of Steps: 1 Entrance Stairs-Rails: None Home Layout: Two level     Bathroom Shower/Tub: Walk-in shower;Tub/shower unit   Bathroom Toilet: Standard     Home Equipment: Conservation officer, nature (2 wheels);Cane - single point;BSC/3in1;Shower seat          Prior Functioning/Environment Prior Level of Function : Needs assist             Mobility Comments: patient independent with mobility per report ADLs Comments: patient independent per patient report        OT Problem List: Decreased strength;Decreased cognition;Decreased safety awareness;Decreased knowledge of precautions      OT Treatment/Interventions:      OT Goals(Current goals can be found in the care plan section) Acute Rehab OT Goals Patient Stated Goal: to go home OT Goal Formulation: With patient Time For Goal Achievement: 04/08/22 Potential to Achieve Goals: Good  OT Frequency:      Co-evaluation              AM-PAC OT "6 Clicks" Daily Activity     Outcome Measure Help from another person eating meals?: None Help from another person taking care of personal grooming?: None Help from another person toileting, which includes using toliet, bedpan, or urinal?: None Help from another person bathing (including washing, rinsing, drying)?:  A Little Help from another person to put on and taking off regular upper body clothing?: None Help from another person to put on and taking off regular lower body clothing?: A Little 6 Click Score: 22   End of Session Equipment Utilized During Treatment: Rolling walker (2  wheels) Nurse Communication: Mobility status;Other (comment) (cognitive impairment requiring 24/7 supervision)  Activity Tolerance: Patient tolerated treatment well Patient left: in bed;with call bell/phone within reach (sitting EOB)  OT Visit Diagnosis: Unsteadiness on feet (R26.81);Muscle weakness (generalized) (M62.81);Other symptoms and signs involving cognitive function                Time: 3754-3606 OT Time Calculation (min): 16 min Charges:  OT General Charges $OT Visit: 1 Visit OT Evaluation $OT Eval Moderate Complexity: 1 Mod  Terry [name], OTR/L Acute Rehabilitation Services [phone]   Ascencion Dike 03/25/2022, 8:47 AM

## 2022-03-25 NOTE — Discharge Summary (Signed)
Physician Discharge Summary  Patient ID: Terry [name] MRN: [id number] DOB/AGE: 12/02/1950 71 y.o.  Admit date: 03/24/2022 Discharge date: 03/25/2022  Admission Diagnoses: Herniated nucleus pulposus L2-L3 with lumbar radiculopathy, neurogenic claudication.  History of arthrodesis L3-L5.  Discharge Diagnoses: Herniated nucleus pulposus L2-L3.  Lumbar radiculopathy.  Neurogenic claudication.  History of arthrodesis L3-L5. Principal Problem:   Herniated nucleus pulposus, L2-3 right   Discharged Condition: good  Hospital Course: Patient was admitted to undergo surgical decompression and arthrodesis at the level of L2-L3.  He tolerated surgery well.  He has had good relief of his severe right leg pain.  The patient had a CBC and a [name] drawn as he had a fair amount of oozing during surgery there is concern for acute blood loss anemia.  His postoperative hemoglobin and hematocrit are within the limits of normal renal function is tested by be met proved to be normal as were the electrolytes despite receiving substantial fluid.  Consults: None  Significant Diagnostic Studies: None  Treatments: surgery: See op note  Discharge Exam: Blood pressure 102/75, pulse 91, temperature 98.6 F (37 C), temperature source Oral, resp. rate 16, height '5\' 8"'  (1.727 m), weight 78 kg, SpO2 97 %. Incision is clean and dry Station and gait are intact.  Right leg strength appears improved in the iliopsoas and quadricep.  Disposition: Discharge disposition: 01-Home or Self Care       Discharge Instructions     Call MD for:  redness, tenderness, or signs of infection (pain, swelling, redness, odor or green/yellow discharge around incision site)   Complete by: As directed    Call MD for:  severe uncontrolled pain   Complete by: As directed    Call MD for:  temperature >100.4   Complete by: As directed    Diet - low sodium heart healthy   Complete by: As directed    Discharge wound care:   Complete  by: As directed    Okay to shower. Do not apply salves or appointments to incision. No heavy lifting with the upper extremities greater than 15 pounds. May resume driving when not requiring pain medication and patient feels comfortable with doing so.   Incentive spirometry RT   Complete by: As directed    Increase activity slowly   Complete by: As directed       Allergies as of 03/25/2022       Reactions   Lipitor [atorvastatin] Other (See Comments)   Muscle issues   Penicillins Hives, Itching, Other (See Comments)   Has patient had a PCN reaction causing immediate rash, facial/tongue/throat swelling, SOB or lightheadedness with hypotension: No Has patient had a PCN reaction causing SEVERE RASH INVOLVING MUCUS MEMBRANES or SKIN NECROSIS: #  #  #  YES  #  #  #  Has patient had a PCN reaction that required hospitalization No Has patient had a PCN reaction occurring within the last 10 years: No        Medication List     TAKE these medications    acetaminophen 500 MG tablet Commonly known as: TYLENOL Take 500 mg by mouth daily.   carbidopa-levodopa 25-100 MG tablet Commonly known as: SINEMET IR Take 1 tablet by mouth 3 (three) times daily.   cetirizine 10 MG tablet Commonly known as: ZYRTEC Take 10 mg by mouth daily as needed for allergies.   cyanocobalamin 1000 MCG tablet Commonly known as: VITAMIN B12 Take 1,000 mcg by mouth daily.   diclofenac Sodium 1 %  Gel Commonly known as: VOLTAREN Apply 2 g topically daily as needed (lower back pain).   donepezil 10 MG tablet Commonly known as: ARICEPT Take 1 tablet (10 mg total) by mouth at bedtime.   MELATONIN PO Take 2 tablets by mouth at bedtime.   methocarbamol 500 MG tablet Commonly known as: ROBAXIN Take 1 tablet (500 mg total) by mouth every 6 (six) hours as needed for muscle spasms.   omeprazole 20 MG tablet Commonly known as: PRILOSEC OTC Take 20 mg by mouth every morning.   oxyCODONE-acetaminophen 5-325  MG tablet Commonly known as: PERCOCET/ROXICET Take 1-2 tablets by mouth every 6 (six) hours as needed for moderate pain or severe pain.   Refresh 1.4-0.6 % Soln Generic drug: Polyvinyl Alcohol-Povidone PF Place 1 drop into both eyes daily as needed (luberication and dry eyes).   rivaroxaban 20 MG Tabs tablet Commonly known as: XARELTO Take 20 mg by mouth daily with supper.               Discharge Care Instructions  (From admission, onward)           Start     Ordered   03/25/22 0000  Discharge wound care:       Comments: Okay to shower. Do not apply salves or appointments to incision. No heavy lifting with the upper extremities greater than 15 pounds. May resume driving when not requiring pain medication and patient feels comfortable with doing so.   03/25/22 0934             Signed: Blanchie Dessert Azra Abrell 03/25/2022, 9:39 AM

## 2022-03-25 NOTE — Progress Notes (Signed)
Orthopedic Tech Progress Note Patient Details:  Terry [name] 1951/03/18 [id number]  WIFE could not find back brace, so RN reached out for one. Patient is working with PT at this time and she is applying it   Ortho Devices Type of Ortho Device: Lumbar corsett Ortho Device/Splint Location: BACK Ortho Device/Splint Interventions: Ordered   Post Interventions Patient Tolerated: Well Instructions Provided: Care of device  Janit Pagan 03/25/2022, 10:38 AM

## 2022-03-25 NOTE — Care Management Obs Status (Signed)
[facility] NOTIFICATION   Patient Details  Name: KARL ERWAY MRN: [id number] Date of Birth: 1951/05/09   Medicare Observation Status Notification Given:  Yes    Pollie Friar, RN 03/25/2022, 10:55 AM

## 2022-03-25 NOTE — Evaluation (Signed)
Physical Therapy Evaluation  Patient Details Name: Terry Terry [name] MRN: [id number] DOB: 1951/05/29 Today's Date: 03/25/2022  History of Present Illness  Patient is a 71 yo male presenting to hospital for L2-L3 fusion on 03/24/22. PMH includes: L3-L5 fusion, Parkinsons, prostate CA, HLD, hay fever, GERD, chronic back pain, asthma, R achilles tendon repair   Clinical Impression  Pt admitted with above diagnosis. At the time of PT eval, pt was able to demonstrate transfers and ambulation with gross modified independence and RW for support. Pt plans to utilize a rollator at home and feel this is appropriate. Brace ordered and arrived during session as pt's wife reports unable to find it at home. Brace was fit to pt and adjusted for proper size. Pt was educated on precautions, brace application/wearing schedule, appropriate activity progression, and car transfer. Pt currently with functional limitations due to the deficits listed below (see PT Problem List). Pt will benefit from skilled PT to increase their independence and safety with mobility to allow discharge to the venue listed below.         Recommendations for follow up therapy are one component of a multi-disciplinary discharge planning process, led by the attending physician.  Recommendations may be updated based on patient status, additional functional criteria and insurance authorization.  Follow Up Recommendations No PT follow up      Assistance Recommended at Discharge PRN  Patient can return home with the following  A little help with walking and/or transfers;A little help with bathing/dressing/bathroom;Assistance with cooking/housework;Assist for transportation;Help with stairs or ramp for entrance    Equipment Recommendations None recommended by PT  Recommendations for Other Services       Functional Status Assessment Patient has had a recent decline in their functional status and demonstrates the ability to make significant  improvements in function in a reasonable and predictable amount of time.     Precautions / Restrictions Precautions Precautions: Back Precaution Booklet Issued: Yes (comment) Precaution Comments: Patient unable to state precautions however good adherence throughout once prompted, handout provided at end of session Required Braces or Orthoses: Spinal Brace Spinal Brace: Lumbar corset;Applied in sitting position Restrictions Weight Bearing Restrictions: No      Mobility  Bed Mobility               General bed mobility comments: Pt was received ambulating in the hallway with wife    Transfers Overall transfer level: Modified independent Equipment used: Rolling walker (2 wheels) Transfers: Sit to/from Stand             General transfer comment: Pt demonstrating good hand placement on seated surface for safety. No assist required. Good controlled descent to EOB    Ambulation/Gait Ambulation/Gait assistance: Modified independent (Device/Increase time) Gait Distance (Feet): 400 Feet Assistive device: Rolling walker (2 wheels) Gait Pattern/deviations: Step-through pattern, Decreased stride length, Narrow base of support Gait velocity: Decreased     General Gait Details: Slow and guarded but overall steady without overt LOB. VC's throughout for maintenance of precautions, as pt twisting to look around room.  Stairs            Wheelchair Mobility    Modified Rankin (Stroke Patients Only)       Balance Overall balance assessment: Mild deficits observed, not formally tested  Pertinent Vitals/Pain Pain Assessment Pain Assessment: No/denies pain    Home Living Family/patient expects to be discharged to:: Private residence Living Arrangements: Spouse/significant other Available Help at Discharge: Family;Friend(s);Available 24 hours/day Type of Home: House Home Access: Stairs to enter Entrance  Stairs-Rails: None Entrance Stairs-Number of Steps: 1 Alternate Level Stairs-Number of Steps: flight Home Layout: Two level;Able to live on main level with bedroom/bathroom Home Equipment: Rolling Walker (2 wheels);Cane - single point;BSC/3in1;Shower seat      Prior Function Prior Level of Function : Terry [name] assist             Mobility Comments: patient independent with mobility per report ADLs Comments: patient independent per patient report     Hand Dominance        Extremity/Trunk Assessment   Upper Extremity Assessment Upper Extremity Assessment: Generalized weakness    Lower Extremity Assessment Lower Extremity Assessment: Generalized weakness (Mild; consistent with pre-op diagnosis)    Cervical / Trunk Assessment Cervical / Trunk Assessment: Back Surgery  Communication   Communication: No difficulties  Cognition Arousal/Alertness: Awake/alert Behavior During Therapy: Impulsive, Restless Overall Cognitive Status: Impaired/Different from baseline Area of Impairment: Orientation, Attention, Memory, Following commands, Safety/judgement, Awareness, Problem solving                 Orientation Level: Place, Time Current Attention Level: Sustained Memory: Decreased recall of precautions, Decreased short-term memory Following Commands: Follows one step commands with increased time, Follows multi-step commands inconsistently Safety/Judgement: Decreased awareness of safety, Decreased awareness of deficits Awareness: Emergent Problem Solving: Slow processing, Decreased initiation, Difficulty sequencing, Requires verbal cues, Requires tactile cues General Comments: Patient with recent diagnosis of Parkinsons, stating it was 2020 when asked the year, and telling OT the date when asking patient where he was located, difficulty with basic sequencing of dressing throughout        General Comments      Exercises     Assessment/Plan    PT Assessment Patient Terry [name]  continued PT services  PT Problem List Decreased strength;Decreased range of motion;Decreased activity tolerance;Decreased balance;Decreased mobility;Decreased knowledge of use of DME;Decreased safety awareness;Decreased knowledge of precautions;Pain       PT Treatment Interventions DME instruction;Gait training;Stair training;Functional mobility training;Therapeutic activities;Therapeutic exercise;Balance training;Patient/family education    PT Goals (Current goals can be found in the Care Plan section)  Acute Rehab PT Goals Patient Stated Goal: Home today PT Goal Formulation: With patient/family Time For Goal Achievement: 04/01/22 Potential to Achieve Goals: Good    Frequency Min 5X/week     Co-evaluation               AM-PAC PT "6 Clicks" Mobility  Outcome Measure Help needed turning from your back to your side while in a flat bed without using bedrails?: None Help needed moving from lying on your back to sitting on the side of a flat bed without using bedrails?: None Help needed moving to and from a bed to a chair (including a wheelchair)?: None Help needed standing up from a chair using your arms (e.g., wheelchair or bedside chair)?: None Help needed to walk in hospital room?: None Help needed climbing 3-5 steps with a railing? : A Little 6 Click Score: 23    End of Session Equipment Utilized During Treatment: Gait belt;Back brace Activity Tolerance: Patient tolerated treatment well Patient left: in bed;with call bell/phone within reach;with family/visitor present Nurse Communication: Mobility status PT Visit Diagnosis: Unsteadiness on feet (R26.81);Pain Pain - part of body:  (back)  Time: 1024-1040 PT Time Calculation (min) (ACUTE ONLY): 16 min   Charges:   PT Evaluation $PT Eval Low Complexity: 1 Low          Rolinda Roan, PT, DPT Acute Rehabilitation Services Secure Chat Preferred Office: [phone]   Thelma Comp 03/25/2022, 1:38 PM

## 2022-03-25 NOTE — Care Management CC44 (Signed)
Condition Code 44 Documentation Completed  Patient Details  Name: Terry [name] MRN: [id number] Date of Birth: January 20, 1951   Condition Code 44 given:  Yes Patient signature on Condition Code 44 notice:  Yes Documentation of 2 MD's agreement:  Yes Code 44 added to claim:  Yes    Pollie Friar, RN 03/25/2022, 10:55 AM

## 2022-04-08 DIAGNOSIS — E785 Hyperlipidemia, unspecified: Secondary | ICD-10-CM | POA: Diagnosis not present

## 2022-04-08 DIAGNOSIS — Z1212 Encounter for screening for malignant neoplasm of rectum: Secondary | ICD-10-CM | POA: Diagnosis not present

## 2022-04-08 DIAGNOSIS — E538 Deficiency of other specified B group vitamins: Secondary | ICD-10-CM | POA: Diagnosis not present

## 2022-04-08 DIAGNOSIS — R739 Hyperglycemia, unspecified: Secondary | ICD-10-CM | POA: Diagnosis not present

## 2022-04-08 DIAGNOSIS — R7989 Other specified abnormal findings of blood chemistry: Secondary | ICD-10-CM | POA: Diagnosis not present

## 2022-04-09 MED FILL — Heparin Sodium (Porcine) Inj 1000 Unit/ML: INTRAMUSCULAR | Qty: 30 | Status: AC

## 2022-04-09 MED FILL — Sodium Chloride IV Soln 0.9%: INTRAVENOUS | Qty: 2000 | Status: AC

## 2022-04-10 ENCOUNTER — Encounter (HOSPITAL_COMMUNITY): Payer: Self-pay | Admitting: Neurological Surgery

## 2022-04-10 DIAGNOSIS — R82998 Other abnormal findings in urine: Secondary | ICD-10-CM | POA: Diagnosis not present

## 2022-04-15 DIAGNOSIS — M5136 Other intervertebral disc degeneration, lumbar region: Secondary | ICD-10-CM | POA: Diagnosis not present

## 2022-04-15 DIAGNOSIS — Z6825 Body mass index (BMI) 25.0-25.9, adult: Secondary | ICD-10-CM | POA: Diagnosis not present

## 2022-04-17 DIAGNOSIS — G20A1 Parkinson's disease without dyskinesia, without mention of fluctuations: Secondary | ICD-10-CM | POA: Diagnosis not present

## 2022-04-17 DIAGNOSIS — R030 Elevated blood-pressure reading, without diagnosis of hypertension: Secondary | ICD-10-CM | POA: Diagnosis not present

## 2022-04-17 DIAGNOSIS — D692 Other nonthrombocytopenic purpura: Secondary | ICD-10-CM | POA: Diagnosis not present

## 2022-04-17 DIAGNOSIS — R413 Other amnesia: Secondary | ICD-10-CM | POA: Diagnosis not present

## 2022-04-17 DIAGNOSIS — K219 Gastro-esophageal reflux disease without esophagitis: Secondary | ICD-10-CM | POA: Diagnosis not present

## 2022-04-17 DIAGNOSIS — M1990 Unspecified osteoarthritis, unspecified site: Secondary | ICD-10-CM | POA: Diagnosis not present

## 2022-04-17 DIAGNOSIS — Z86711 Personal history of pulmonary embolism: Secondary | ICD-10-CM | POA: Diagnosis not present

## 2022-04-17 DIAGNOSIS — R739 Hyperglycemia, unspecified: Secondary | ICD-10-CM | POA: Diagnosis not present

## 2022-04-17 DIAGNOSIS — Z7901 Long term (current) use of anticoagulants: Secondary | ICD-10-CM | POA: Diagnosis not present

## 2022-04-17 DIAGNOSIS — Z1331 Encounter for screening for depression: Secondary | ICD-10-CM | POA: Diagnosis not present

## 2022-04-17 DIAGNOSIS — Z0000 Encounter for general adult medical examination without abnormal findings: Secondary | ICD-10-CM | POA: Diagnosis not present

## 2022-04-17 DIAGNOSIS — E785 Hyperlipidemia, unspecified: Secondary | ICD-10-CM | POA: Diagnosis not present

## 2022-04-28 ENCOUNTER — Encounter: Payer: Self-pay | Admitting: Internal Medicine

## 2022-04-28 ENCOUNTER — Telehealth: Payer: Self-pay

## 2022-04-28 ENCOUNTER — Ambulatory Visit (INDEPENDENT_AMBULATORY_CARE_PROVIDER_SITE_OTHER): Payer: Medicare Other | Admitting: Internal Medicine

## 2022-04-28 VITALS — BP 130/68 | HR 76 | Ht 67.0 in | Wt 171.8 lb

## 2022-04-28 DIAGNOSIS — K219 Gastro-esophageal reflux disease without esophagitis: Secondary | ICD-10-CM

## 2022-04-28 DIAGNOSIS — Z7901 Long term (current) use of anticoagulants: Secondary | ICD-10-CM | POA: Diagnosis not present

## 2022-04-28 DIAGNOSIS — R195 Other fecal abnormalities: Secondary | ICD-10-CM

## 2022-04-28 DIAGNOSIS — Z86010 Personal history of colonic polyps: Secondary | ICD-10-CM | POA: Diagnosis not present

## 2022-04-28 NOTE — Telephone Encounter (Signed)
[facility] Pre-operative Risk Assessment     Request for surgical clearance:     Endoscopy Procedure  What type of surgery is being performed?     Egd/colonoscopy  When is this surgery scheduled?     06/10/2022  What type of clearance is required ?   Pharmacy  Are there any medications that need to be held prior to surgery and how long? Xarelto - 2 days  Practice name and name of physician performing surgery?      [facility]  What is your office phone and fax number?      Phone- [phone]  [phone]  Anesthesia type (None, local, MAC, general) ?       MAC

## 2022-04-28 NOTE — Patient Instructions (Signed)
If you are age 73 or older, your body mass index should be between 23-30. Your Body mass index is 26.91 kg/m?. If this is out of the aforementioned range listed, please consider follow up with your Primary Care Provider. ? ?If you are age 64 or younger, your body mass index should be between 19-25. Your Body mass index is 26.91 kg/m?. If this is out of the aformentioned range listed, please consider follow up with your Primary Care Provider.  ? ?________________________________________________________ ? ?The [facility] providers would like to encourage you to use MYCHART to communicate with providers for non-urgent requests or questions.  Due to long hold times on the telephone, sending your provider a message by MYCHART may be a faster and more efficient way to get a response.  Please allow 48 business hours for a response.  Please remember that this is for non-urgent requests.  ?_______________________________________________________ ? ? ? ? ? ?

## 2022-04-28 NOTE — Progress Notes (Signed)
HISTORY OF PRESENT ILLNESS:  Terry [name] is a 71 y.o. male, retired [employer] manager, with multiple medical problems as listed below.  He presents today regarding Hemoccult positive stool, GERD, and the need for endoscopic procedures.  The patient was evaluated June 21, 2020 regarding the same.  The impression at that time was asymptomatic Hemoccult positive stool with normal hemoglobin; chronic GERD requiring PPI to control symptoms; has a history of adenomatous colon polyps with most recent colonoscopy 2016.  Patient was scheduled for colonoscopy and upper endoscopy.  He canceled on multiple occasions.  Earlier this year (August 12, 2020) he had a DVT with pulmonary embolism.  He is now on chronic Xarelto therapy.  He recently saw his PCP who said it was okay at this time to undergo his procedures and come off anticoagulation for 2 days preprocedure.  He also underwent back surgery recently and was off anticoagulation.  The patient tells me that his reflux symptoms are controlled with PPI.  No dysphagia.  He does describe having problems this year with occasional urgency and loose stools with incontinence.  About that same time he was placed on Aricept.  No bleeding.  For his GERD he takes omeprazole 20 mg daily.  He has multiple questions.  He brings Sutab prep from January 2022 with him today.  Hoping to use it.  It has not expired yet, but soon to expire.  REVIEW OF SYSTEMS:  All non-GI ROS negative as otherwise stated in the HPI except for arthritis, back pain  Past Medical History:  Diagnosis Date   Arthritis    neck, back, knees, right ankle   Bladder calculus    Chronic back pain    GERD (gastroesophageal reflux disease)    History of colon polyps    benign   History of kidney stones    and bladder stone   History of prostate cancer urologist-- dr Alinda Money   dx 2014---  s/p  prostatectomy 05-22-2013 ,  Gleason 3+3   Hypercholesterolemia    takes Crestor daily    Hyperlipidemia    Pneumonia 10/2019   Pulmonary emboli ([place]) 10/2019   Wears glasses     Past Surgical History:  Procedure Laterality Date   ACHILLES TENDON SURGERY Right 06-07-2018   '@[facility]'$    reconstruction   ANTERIOR CERVICAL DECOMP/DISCECTOMY FUSION N/A 06/27/2014   Procedure: ANTERIOR CERVICAL DECOMPRESSION/DISCECTOMY FUSION C5-C7   (2 LEVELS);  Surgeon: Melina Schools, MD;  Location: [facility];  Service: Orthopedics;  Laterality: N/A;   back fusion  03/2022   CARDIAC CATHETERIZATION  2004   COLONOSCOPY     CYSTOSCOPY WITH LITHOLAPAXY N/A 03/27/2019   Procedure: CYSTOSCOPY WITH REMOVAL OF BLADDER STONE AND FOREIGN BODY;  Surgeon: Raynelle Bring, MD;  Location: [facility];  Service: Urology;  Laterality: N/A;   EYE SURGERY     lazy eye as child, lasik surgery   FINGER SURGERY Right    4th finger   I & D EXTREMITY Right 07/10/2018   Procedure: IRRIGATION AND DEBRIDEMENT RIGHT ANKLE WOUND AND WOUND VAC PLACEMENT;  Surgeon: Wylene Simmer, MD;  Location: [facility];  Service: Orthopedics;  Laterality: Right;   KNEE ARTHROSCOPY Bilateral right ?;  left 05-25-2007  '@[facility]'$    POSTERIOR LAMINECTOMY / DECOMPRESSION LUMBAR SPINE  08-31-2016    dr elsner  '@[facility]'$    PROSTATE BIOPSY  04/13/2013   gleason 3+3=6, vol 55.4 cc   ROBOT ASSISTED LAPAROSCOPIC RADICAL PROSTATECTOMY N/A 05/22/2013   Procedure: ROBOTIC ASSISTED LAPAROSCOPIC  RADICAL PROSTATECTOMY LEVEL 1;  Surgeon: Dutch Gray, MD;  Location: [facility];  Service: Urology;  Laterality: N/A;   TONSILLECTOMY      Social History Terry [name]  reports that he has never smoked. He has never used smokeless tobacco. He reports current alcohol use. He reports that he does not use drugs.  family history includes Dementia in his mother; Heart attack in his father; Heart disease in his father; Pneumonia in his mother.  Allergies  Allergen Reactions   Lipitor [Atorvastatin] Other (See Comments)    Muscle issues   Penicillins Hives, Itching and  Other (See Comments)    Has patient had a PCN reaction causing immediate rash, facial/tongue/throat swelling, SOB or lightheadedness with hypotension: No Has patient had a PCN reaction causing SEVERE RASH INVOLVING MUCUS MEMBRANES or SKIN NECROSIS: #  #  #  YES  #  #  #  Has patient had a PCN reaction that required hospitalization No Has patient had a PCN reaction occurring within the last 10 years: No        PHYSICAL EXAMINATION: Vital signs: BP 130/68   Pulse 76   Ht '5\' 7"'$  (1.702 m)   Wt 171 lb 12.8 oz (77.9 kg)   SpO2 98%   BMI 26.91 kg/m   Constitutional: generally well-appearing, no acute distress Psychiatric: alert and oriented x3, cooperative Eyes: extraocular movements intact, anicteric, conjunctiva pink Mouth: oral pharynx moist, no lesions Neck: supple no lymphadenopathy Cardiovascular: heart regular rate and rhythm, no murmur Lungs: clear to auscultation bilaterally Abdomen: soft, nontender, nondistended, no obvious ascites, no peritoneal signs, normal bowel sounds, no organomegaly Rectal: Deferred until colonoscopy Extremities: no clubbing, cyanosis, or lower extremity edema bilaterally Skin: no lesions on visible extremities Neuro: No focal deficits.  Cranial nerves intact  ASSESSMENT:  1.  Hemoccult positive stool.  Normal hemoglobin March 25, 2022 (15.1).  Asymptomatic 2.  Chronic GERD.  Symptoms controlled with PPI. 3.  History of pulmonary embolism March 2023.  On chronic Xarelto therapy.  Okay for short-term interruption per his PCP.  He had short-term interruption for his back surgery. 4.  History of adenomatous colon polyps.  Prior colonoscopy 2006, 2011, and 2016.  Adenomas on first 2 exams.  He also has diverticulosis. 5.  Change in bowel habits.  Likely due to Aricept  PLAN:  1.  Colonoscopy with possible biopsies to evaluate Hemoccult positive stool and change in bowel habits.  The patient is high risk given his comorbidities and the need to address  chronic anticoagulation.The nature of the procedure, as well as the risks, benefits, and alternatives were carefully and thoroughly reviewed with the patient. Ample time for discussion and questions allowed. The patient understood, was satisfied, and agreed to proceed. 2.  Upper endoscopy to evaluate Hemoccult positive stool chronic GERD symptoms.  High risk as above.The nature of the procedure, as well as the risks, benefits, and alternatives were carefully and thoroughly reviewed with the patient. Ample time for discussion and questions allowed. The patient understood, was satisfied, and agreed to proceed. 3.  Continue omeprazole 20 mg daily.  Medication risks and benefits reviewed. 4.  Hold anticoagulation 2 days preprocedure.  Anticipate resumption immediately post procedure pending findings. 5.  Ongoing general medical care with Dr. Virgina Jock A total time of 40 minutes was spent preparing to see the patient, reviewing outside records and hospital notes, laboratories,.  Also, obtaining comprehensive history, performing medically appropriate physical examination, counseling and educating the patient regarding the above listed  issues, answering questions, ordering multiple endoscopic procedures, and documenting clinical information in the health record.

## 2022-04-29 NOTE — Telephone Encounter (Unsigned)
Patient with diagnosis of PE on Xarelto for anticoagulation.    Procedure: EGD/Colonoscopy Date of procedure: 06/10/22  CrCl 61 mL/min Platelet count 282K  Per office protocol, patient can hold Xarelto for 2 days prior to procedure.    **This guidance is not considered finalized until pre-operative APP has relayed final recommendations.**

## 2022-04-30 NOTE — Telephone Encounter (Signed)
    Primary Cardiologist:None  Chart reviewed as part of pre-operative protocol coverage. Because of Terry [name] past medical history and time since last visit, he/she will require a follow-up visit in order to better assess preoperative cardiovascular risk.  Pre-op covering staff: - Please schedule appointment and call patient to inform them. - Please contact requesting surgeon's office via preferred method (i.e, phone, fax) to inform them of need for appointment prior to surgery.  If applicable, this message will also be routed to pharmacy pool and/or primary cardiologist for input on holding anticoagulant/antiplatelet agent as requested below so that this information is available at time of patient's appointment.   Deberah Pelton, NP  04/30/2022, 10:26 AM

## 2022-04-30 NOTE — Telephone Encounter (Signed)
Lvm for pt to call office to schedule appt for surgical clearance.

## 2022-05-01 NOTE — Telephone Encounter (Signed)
Pt has new pt appt 05/05/22 with Dr. Gwenlyn Found. I will update the requesting office and forward notes to MD for new pt/pre op appt 05/05/22

## 2022-05-05 ENCOUNTER — Encounter: Payer: Self-pay | Admitting: Cardiovascular Disease

## 2022-05-05 ENCOUNTER — Ambulatory Visit: Payer: Medicare Other | Attending: Cardiovascular Disease | Admitting: Cardiovascular Disease

## 2022-05-05 VITALS — BP 142/76 | HR 57 | Ht 67.0 in | Wt 168.0 lb

## 2022-05-05 DIAGNOSIS — I2782 Chronic pulmonary embolism: Secondary | ICD-10-CM | POA: Diagnosis not present

## 2022-05-05 DIAGNOSIS — Z01818 Encounter for other preprocedural examination: Secondary | ICD-10-CM | POA: Diagnosis not present

## 2022-05-05 DIAGNOSIS — Z7901 Long term (current) use of anticoagulants: Secondary | ICD-10-CM | POA: Diagnosis not present

## 2022-05-05 DIAGNOSIS — Z8249 Family history of ischemic heart disease and other diseases of the circulatory system: Secondary | ICD-10-CM | POA: Diagnosis not present

## 2022-05-05 NOTE — Assessment & Plan Note (Signed)
History of pulmonary emboli in the past over 5 years ago on Xarelto oral anticoagulation since.

## 2022-05-05 NOTE — Assessment & Plan Note (Signed)
Father died at age 73 of a myocardial infarction

## 2022-05-05 NOTE — Patient Instructions (Signed)
Medication Instructions:  Your physician recommends that you continue on your current medications as directed. Please refer to the Current Medication list given to you today.  *If you need a refill on your cardiac medications before your next appointment, please call your pharmacy*   Testing/Procedures: Dr. Gwenlyn Found has ordered a CT coronary calcium score.   Test locations:  [place]   This is $99 out of pocket.   Coronary CalciumScan A coronary calcium scan is an imaging test used to look for deposits of calcium and other fatty materials (plaques) in the inner lining of the blood vessels of the heart (coronary arteries). These deposits of calcium and plaques can partly clog and narrow the coronary arteries without producing any symptoms or warning signs. This puts a person at risk for a heart attack. This test can detect these deposits before symptoms develop. Tell a health care provider about: Any allergies you have. All medicines you are taking, including vitamins, herbs, eye drops, creams, and over-the-counter medicines. Any problems you or family members have had with anesthetic medicines. Any blood disorders you have. Any surgeries you have had. Any medical conditions you have. Whether you are pregnant or may be pregnant. What are the risks? Generally, this is a safe procedure. However, problems may occur, including: Harm to a pregnant woman and her unborn baby. This test involves the use of radiation. Radiation exposure can be dangerous to a pregnant woman and her unborn baby. If you are pregnant, you generally should not have this procedure done. Slight increase in the risk of cancer. This is because of the radiation involved in the test. What happens before the procedure? No preparation is needed for this procedure. What happens during the procedure? You will undress and remove any jewelry around your neck or chest. You will put on a hospital  gown. Sticky electrodes will be placed on your chest. The electrodes will be connected to an electrocardiogram (ECG) machine to record a tracing of the electrical activity of your heart. A CT scanner will take pictures of your heart. During this time, you will be asked to lie still and hold your breath for 2-3 seconds while a picture of your heart is being taken. The procedure may vary among health care providers and hospitals. What happens after the procedure? You can get dressed. You can return to your normal activities. It is up to you to get the results of your test. Ask your health care provider, or the department that is doing the test, when your results will be ready. Summary A coronary calcium scan is an imaging test used to look for deposits of calcium and other fatty materials (plaques) in the inner lining of the blood vessels of the heart (coronary arteries). Generally, this is a safe procedure. Tell your health care provider if you are pregnant or may be pregnant. No preparation is needed for this procedure. A CT scanner will take pictures of your heart. You can return to your normal activities after the scan is done. This information is not intended to replace advice given to you by your health care provider. Make sure you discuss any questions you have with your health care provider. Document Released: 11/14/2007 Document Revised: 04/06/2016 Document Reviewed: 04/06/2016 Elsevier Interactive Patient Education  2017 [place]: At [facility], you and your health needs are our priority.  As part of our continuing mission to provide you with exceptional heart care, we have created  designated Provider Care Teams.  These Care Teams include your primary Cardiologist (physician) and Advanced Practice Providers (APPs -  Physician Assistants and Nurse Practitioners) who all work together to provide you with the care you need, when you need it.  We recommend  signing up for the patient portal called "MyChart".  Sign up information is provided on this After Visit Summary.  MyChart is used to connect with patients for Virtual Visits (Telemedicine).  Patients are able to view lab/test results, encounter notes, upcoming appointments, etc.  Non-urgent messages can be sent to your provider as well.   To learn more about what you can do with MyChart, go to [email or web address].    Your next appointment:   We will see you on an as needed basis.   Provider:  Quay Burow, MD   Other Instructions Cleared at low risk from a cardiac standpoint. Please stop xarelto 2 days prior to procedure and resume when Dr. Henrene Pastor tells you to.

## 2022-05-05 NOTE — Assessment & Plan Note (Signed)
Patient here for preoperative clearance before elective endoscopy.  He is on Xarelto for prior pulmonary emboli.  He is has essentially no cardiac risk factors other than family history.  I think it safe for him to stop his Xarelto 2 days prior to his endoscopy.  He had back surgery 6 weeks ago and stopped his Xarelto 2 days prior to that as well.

## 2022-05-05 NOTE — Telephone Encounter (Signed)
Spoke with patient and clarified that he knew that, after his cardiac appointment today, he can hold his Xarelto for 2 days prior to his procedure.  Patient agreed.

## 2022-05-05 NOTE — Progress Notes (Signed)
05/05/2022 [name]   05/17/1951  [id number]  Primary Physician Shon Baton, MD Primary Cardiologist: Lorretta Harp MD Lupe Carney, [place]  HPI:  Terry [name] is a 71 y.o. thin and fit appearing married Caucasian male father of 2 children, grandfather of 2 grandchildren referred by Dr. Henrene Pastor, gastroenterology, for preoperative clearance before his scheduled endoscopy.  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  He has no cardiac risk factors other than family history with a father that died at age 92 of myocardial infarction.  He is never had a heart attack or stroke.  He did have a pulmonary embolism 5 years ago and has been on Xarelto since.  He was an active runner for a long time and has had multiple back surgeries most recently by Dr. Ellene Route 6 weeks ago for which she stopped his Xarelto 2 days before.  He is very active but has complained of some exertional chest tightness.   Current Meds  Medication Sig   acetaminophen (TYLENOL) 500 MG tablet Take 500 mg by mouth daily.   carbidopa-levodopa (SINEMET IR) 25-100 MG tablet Take 1 tablet by mouth 3 (three) times daily.   cetirizine (ZYRTEC) 10 MG tablet Take 10 mg by mouth daily as needed for allergies.   cyanocobalamin (VITAMIN B12) 1000 MCG tablet Take 1,000 mcg by mouth daily.   diclofenac Sodium (VOLTAREN) 1 % GEL Apply 2 g topically daily as needed (lower back pain).   donepezil (ARICEPT) 10 MG tablet Take 1 tablet (10 mg total) by mouth at bedtime.   gabapentin (NEURONTIN) 300 MG capsule Take 300 mg by mouth daily.   MELATONIN PO Take 2 tablets by mouth at bedtime.   methocarbamol (ROBAXIN) 500 MG tablet Take 1 tablet (500 mg total) by mouth every 6 (six) hours as needed for muscle spasms.   omeprazole (PRILOSEC OTC) 20 MG tablet Take 20 mg by mouth every morning.   Polyvinyl Alcohol-Povidone PF (REFRESH) 1.4-0.6 % SOLN Place 1 drop into both eyes daily as needed (luberication and dry  eyes).   rivaroxaban (XARELTO) 20 MG TABS tablet Take 20 mg by mouth daily with supper.     Allergies  Allergen Reactions   Lipitor [Atorvastatin] Other (See Comments)    Muscle issues   Penicillins Hives, Itching and Other (See Comments)    Has patient had a PCN reaction causing immediate rash, facial/tongue/throat swelling, SOB or lightheadedness with hypotension: No Has patient had a PCN reaction causing SEVERE RASH INVOLVING MUCUS MEMBRANES or SKIN NECROSIS: #  #  #  YES  #  #  #  Has patient had a PCN reaction that required hospitalization No Has patient had a PCN reaction occurring within the last 10 years: No     Social History   Socioeconomic History   Marital status: Married    Spouse name: [name]   Number of children: 2   Years of education: Not on file   Highest education level: Not on file  Occupational History   Occupation: retired  Tobacco Use   Smoking status: Never   Smokeless tobacco: Never  Vaping Use   Vaping Use: Never used  Substance and Sexual Activity   Alcohol use: Yes    Alcohol/week: 0.0 standard drinks of alcohol    Comment: occ   Drug use: Never   Sexual activity: Not Currently    Comment: vasectomy  Other Topics Concern   Not on file  Social History Narrative   Not on file   Social Determinants of Health   Financial Resource Strain: Not on file  Food Insecurity: Not on file  Transportation Needs: Not on file  Physical Activity: Not on file  Stress: Not on file  Social Connections: Not on file  Intimate Partner Violence: Not on file     Review of Systems: General: negative for chills, fever, night sweats or weight changes.  Cardiovascular: negative for chest pain, dyspnea on exertion, edema, orthopnea, palpitations, paroxysmal nocturnal dyspnea or shortness of breath Dermatological: negative for rash Respiratory: negative for cough or wheezing Urologic: negative for hematuria Abdominal: negative for nausea, vomiting, diarrhea,  bright red blood per rectum, melena, or hematemesis Neurologic: negative for visual changes, syncope, or dizziness All other systems reviewed and are otherwise negative except as noted above.    Blood pressure (!) 142/76, pulse (!) 57, height '5\' 7"'$  (1.702 m), weight 168 lb (76.2 kg), SpO2 96 %.  General appearance: alert and no distress Neck: no adenopathy, no carotid bruit, no JVD, supple, symmetrical, trachea midline, and thyroid not enlarged, symmetric, no tenderness/mass/nodules Lungs: clear to auscultation bilaterally Heart: regular rate and rhythm, S1, S2 normal, no murmur, click, rub or gallop Extremities: extremities normal, atraumatic, no cyanosis or edema Pulses: 2+ and symmetric Skin: Skin color, texture, turgor normal. No rashes or lesions Neurologic: Grossly normal  EKG sinus bradycardia at 57 without ST or T wave changes.  I personally reviewed this EKG.  ASSESSMENT AND PLAN:   Family history of heart disease Father died at age 73 of a myocardial infarction  Preoperative clearance Patient here for preoperative clearance before elective endoscopy.  He is on Xarelto for prior pulmonary emboli.  He is has essentially no cardiac risk factors other than family history.  I think it safe for him to stop his Xarelto 2 days prior to his endoscopy.  He had back surgery 6 weeks ago and stopped his Xarelto 2 days prior to that as well.  Pulmonary embolism ([place]) History of pulmonary emboli in the past over 5 years ago on Xarelto oral anticoagulation since.     Lorretta Harp MD FACP,FACC,FAHA, [facility] 05/05/2022 8:50 AM

## 2022-05-05 NOTE — Telephone Encounter (Signed)
-----   Message from Irene Shipper, MD sent at 05/05/2022  1:46 PM EST ----- Thanks.  Marin Comment to hold anticoagulation 2 days prior to his procedures Thanks Dr. Mamie Nick  ----- Message ----- From: Beatrix Fetters, RN Sent: 05/05/2022  11:45 AM EST To: Irene Shipper, MD  Pt has been cleared at low risk from a cardiac standpoint per Dr. Gwenlyn Found. Thank you!

## 2022-05-21 DIAGNOSIS — H5203 Hypermetropia, bilateral: Secondary | ICD-10-CM | POA: Diagnosis not present

## 2022-05-21 DIAGNOSIS — H35371 Puckering of macula, right eye: Secondary | ICD-10-CM | POA: Diagnosis not present

## 2022-05-21 DIAGNOSIS — Z961 Presence of intraocular lens: Secondary | ICD-10-CM | POA: Diagnosis not present

## 2022-05-22 DIAGNOSIS — Z6826 Body mass index (BMI) 26.0-26.9, adult: Secondary | ICD-10-CM | POA: Diagnosis not present

## 2022-05-22 DIAGNOSIS — M5136 Other intervertebral disc degeneration, lumbar region: Secondary | ICD-10-CM | POA: Diagnosis not present

## 2022-05-22 DIAGNOSIS — M5416 Radiculopathy, lumbar region: Secondary | ICD-10-CM | POA: Diagnosis not present

## 2022-06-08 DIAGNOSIS — M5417 Radiculopathy, lumbosacral region: Secondary | ICD-10-CM | POA: Diagnosis not present

## 2022-06-08 DIAGNOSIS — M5116 Intervertebral disc disorders with radiculopathy, lumbar region: Secondary | ICD-10-CM | POA: Diagnosis not present

## 2022-06-10 ENCOUNTER — Encounter: Payer: Self-pay | Admitting: Internal Medicine

## 2022-06-10 ENCOUNTER — Ambulatory Visit (AMBULATORY_SURGERY_CENTER): Payer: Medicare Other | Admitting: Internal Medicine

## 2022-06-10 VITALS — BP 154/70 | HR 57 | Temp 98.4°F | Resp 13 | Ht 69.0 in | Wt 171.0 lb

## 2022-06-10 DIAGNOSIS — D120 Benign neoplasm of cecum: Secondary | ICD-10-CM

## 2022-06-10 DIAGNOSIS — Z86010 Personal history of colonic polyps: Secondary | ICD-10-CM | POA: Diagnosis not present

## 2022-06-10 DIAGNOSIS — E669 Obesity, unspecified: Secondary | ICD-10-CM | POA: Diagnosis not present

## 2022-06-10 DIAGNOSIS — K219 Gastro-esophageal reflux disease without esophagitis: Secondary | ICD-10-CM | POA: Diagnosis not present

## 2022-06-10 DIAGNOSIS — R195 Other fecal abnormalities: Secondary | ICD-10-CM | POA: Diagnosis not present

## 2022-06-10 DIAGNOSIS — Z09 Encounter for follow-up examination after completed treatment for conditions other than malignant neoplasm: Secondary | ICD-10-CM | POA: Diagnosis not present

## 2022-06-10 DIAGNOSIS — G20A1 Parkinson's disease without dyskinesia, without mention of fluctuations: Secondary | ICD-10-CM | POA: Diagnosis not present

## 2022-06-10 MED ORDER — SODIUM CHLORIDE 0.9 % IV SOLN: 500.0000 mL | INTRAVENOUS | Status: DC

## 2022-06-10 NOTE — Progress Notes (Signed)
Report to PACU, RN, vss, BBS= Clear.  

## 2022-06-10 NOTE — Progress Notes (Signed)
Pt's states no medical or surgical changes since previsit or office visit. 

## 2022-06-10 NOTE — Op Note (Signed)
[facility] Patient Name: Mont Jagoda Procedure Date: 06/10/2022 2:38 PM MRN: [id number] Endoscopist: Docia Chuck. Henrene Pastor , MD, [id number] Age: 72 Referring MD:  Date of Birth: 1950-09-05 Gender: Male Account #: [id number] Procedure:                Upper GI endoscopy Indications:              Esophageal reflux, Heme positive stool Medicines:                Monitored Anesthesia Care Procedure:                Pre-Anesthesia Assessment:                           - Prior to the procedure, a History and Physical                            was performed, and patient medications and                            allergies were reviewed. The patient's tolerance of                            previous anesthesia was also reviewed. The risks                            and benefits of the procedure and the sedation                            options and risks were discussed with the patient.                            All questions were answered, and informed consent                            was obtained. Prior Anticoagulants: The patient has                            taken Xarelto (rivaroxaban), last dose was 5 days                            prior to procedure. ASA Grade Assessment: III - A                            patient with severe systemic disease. After                            reviewing the risks and benefits, the patient was                            deemed in satisfactory condition to undergo the                            procedure.  After obtaining informed consent, the endoscope was                            passed under direct vision. Throughout the                            procedure, the patient's blood pressure, pulse, and                            oxygen saturations were monitored continuously. The                            GIF [id number] #[id number] was introduced through the                            mouth, and advanced to the second part of  duodenum.                            The upper GI endoscopy was accomplished without                            difficulty. The patient tolerated the procedure                            well. Scope In: Scope Out: Findings:                 The esophagus was normal.                           The stomach was normal, save hiatal hernia.                           The examined duodenum was normal.                           The cardia and gastric fundus were normal on                            retroflexion. Complications:            No immediate complications. Estimated Blood Loss:     Estimated blood loss: none. Impression:               1. Normal EGD, save hiatal hernia.                           2. GERD Recommendation:           - Patient has a contact number available for                            emergencies. The signs and symptoms of potential                            delayed complications were discussed with the  patient. Return to normal activities tomorrow.                            Written discharge instructions were provided to the                            patient.                           - Resume previous diet.                           - Continue present medications. RESUME Xarelto today                           -RECOMMEND staying on omeprazole 20 mg (acid reflux                            medicine) daily                           -Return to the care of Dr. Virgina Jock. GI follow-up as                            needed Docia Chuck. Henrene Pastor, MD 06/10/2022 3:15:10 PM This report has been signed electronically.

## 2022-06-10 NOTE — Op Note (Signed)
[facility] Patient Name: Terry [name] Procedure Date: 06/10/2022 2:38 PM MRN: [id number] Endoscopist: Docia Chuck. Henrene Pastor , MD, [id number] Age: 73 Referring MD:  Date of Birth: Jun 08, 1950 Gender: Male Account #: [id number] Procedure:                Colonoscopy with cold snare polypectomy x 1 Indications:              Heme positive stool. The patient has a history of                            adenomatous colon polyps. Previous examinations                            2006, 2011, 2016 Medicines:                Monitored Anesthesia Care Procedure:                Pre-Anesthesia Assessment:                           - Prior to the procedure, a History and Physical                            was performed, and patient medications and                            allergies were reviewed. The patient's tolerance of                            previous anesthesia was also reviewed. The risks                            and benefits of the procedure and the sedation                            options and risks were discussed with the patient.                            All questions were answered, and informed consent                            was obtained. Prior Anticoagulants: The patient has                            taken Xarelto (rivaroxaban), last dose was 5 days                            prior to procedure (had back injection earlier this                            week and has continued off his anticoagulant). ASA                            Grade Assessment: III - A patient with severe  systemic disease. After reviewing the risks and                            benefits, the patient was deemed in satisfactory                            condition to undergo the procedure.                           After obtaining informed consent, the colonoscope                            was passed under direct vision. Throughout the                            procedure,  the patient's blood pressure, pulse, and                            oxygen saturations were monitored continuously. The                            Colonoscope Olympus [id number] was introduced through                            the anus and advanced to the the cecum, identified                            by appendiceal orifice and ileocecal valve. The                            ileocecal valve, appendiceal orifice, and rectum                            were photographed. The quality of the bowel                            preparation was excellent. The colonoscopy was                            performed without difficulty. The patient tolerated                            the procedure well. The bowel preparation used was                            SUPREP via split dose instruction. Scope In: 2:50:27 PM Scope Out: 3:00:55 PM Scope Withdrawal Time: 0 hours 7 minutes 48 seconds  Total Procedure Duration: 0 hours 10 minutes 28 seconds  Findings:                 A 2 mm polyp was found in the cecum. The polyp was                            removed with a cold snare. Resection  and retrieval                            were complete.                           Multiple diverticula were found in the sigmoid                            colon and ascending colon.                           Internal hemorrhoids were found during                            retroflexion. The hemorrhoids were moderate.                           The exam was otherwise without abnormality on                            direct and retroflexion views. Complications:            No immediate complications. Estimated blood loss:                            None. Estimated Blood Loss:     Estimated blood loss: none. Impression:               - One 2 mm polyp in the cecum, removed with a cold                            snare. Resected and retrieved.                           - Diverticulosis in the sigmoid colon and in the                             ascending colon.                           - Internal hemorrhoids.                           - The examination was otherwise normal on direct                            and retroflexion views. Recommendation:           - Repeat colonoscopy in 7 years for surveillance if                            polyp adenomatous. Otherwise, no follow-up                            surveillance recommended.                           -  Resume Xarelto (rivaroxaban) today at prior dose.                           - Patient has a contact number available for                            emergencies. The signs and symptoms of potential                            delayed complications were discussed with the                            patient. Return to normal activities tomorrow.                            Written discharge instructions were provided to the                            patient.                           - Resume previous diet.                           - Continue present medications.                           - Await pathology results.                           - EGD today. Please see report Docia Chuck. Henrene Pastor, MD 06/10/2022 3:07:10 PM This report has been signed electronically.

## 2022-06-10 NOTE — Progress Notes (Signed)
HISTORY OF PRESENT ILLNESS:   Terry [name] is a 72 y.o. male, retired [employer], with multiple medical problems as listed below.  He presents today regarding Hemoccult positive stool, GERD, and the need for endoscopic procedures.  The patient was evaluated June 21, 2020 regarding the same.  The impression at that time was asymptomatic Hemoccult positive stool with normal hemoglobin; chronic GERD requiring PPI to control symptoms; has a history of adenomatous colon polyps with most recent colonoscopy 2016.  Patient was scheduled for colonoscopy and upper endoscopy.  He canceled on multiple occasions.  Earlier this year (August 12, 2020) he had a DVT with pulmonary embolism.  He is now on chronic Xarelto therapy.  He recently saw his PCP who said it was okay at this time to undergo his procedures and come off anticoagulation for 2 days preprocedure.  He also underwent back surgery recently and was off anticoagulation.   The patient tells me that his reflux symptoms are controlled with PPI.  No dysphagia.  He does describe having problems this year with occasional urgency and loose stools with incontinence.  About that same time he was placed on Aricept.  No bleeding.  For his GERD he takes omeprazole 20 mg daily.  He has multiple questions.  He brings Sutab prep from January 2022 with him today.  Hoping to use it.  It has not expired yet, but soon to expire.   REVIEW OF SYSTEMS:   All non-GI ROS negative as otherwise stated in the HPI except for arthritis, back pain       Past Medical History:  Diagnosis Date   Arthritis      neck, back, knees, right ankle   Bladder calculus     Chronic back pain     GERD (gastroesophageal reflux disease)     History of colon polyps      benign   History of kidney stones      and bladder stone   History of prostate cancer urologist-- dr Terry [name]    dx 2014---  s/p  prostatectomy 05-22-2013 ,  Gleason 3+3   Hypercholesterolemia      takes Crestor  daily   Hyperlipidemia     Pneumonia 10/2019   Pulmonary emboli ([place]) 10/2019   Wears glasses             Past Surgical History:  Procedure Laterality Date   ACHILLES TENDON SURGERY Right 06-07-2018   '@[facility]'$     reconstruction   ANTERIOR CERVICAL DECOMP/DISCECTOMY FUSION N/A 06/27/2014    Procedure: ANTERIOR CERVICAL DECOMPRESSION/DISCECTOMY FUSION C5-C7   (2 LEVELS);  Surgeon: Terry Schools, MD;  Location: [facility];  Service: Orthopedics;  Laterality: N/A;   back fusion   03/2022   CARDIAC CATHETERIZATION   2004   COLONOSCOPY       CYSTOSCOPY WITH LITHOLAPAXY N/A 03/27/2019    Procedure: CYSTOSCOPY WITH REMOVAL OF BLADDER STONE AND FOREIGN BODY;  Surgeon: Terry Bring, MD;  Location: [facility];  Service: Urology;  Laterality: N/A;   EYE SURGERY        lazy eye as child, lasik surgery   FINGER SURGERY Right      4th finger   I & D EXTREMITY Right 07/10/2018    Procedure: IRRIGATION AND DEBRIDEMENT RIGHT ANKLE WOUND AND WOUND VAC PLACEMENT;  Surgeon: Terry Simmer, MD;  Location: [place];  Service: Orthopedics;  Laterality: Right;   KNEE ARTHROSCOPY Bilateral right ?;  left 05-25-2007  '@[facility]'$   POSTERIOR LAMINECTOMY / DECOMPRESSION LUMBAR SPINE   08-31-2016    dr elsner  '@[facility]'$    PROSTATE BIOPSY   04/13/2013    gleason 3+3=6, vol 55.4 cc   ROBOT ASSISTED LAPAROSCOPIC RADICAL PROSTATECTOMY N/A 05/22/2013    Procedure: ROBOTIC ASSISTED LAPAROSCOPIC RADICAL PROSTATECTOMY LEVEL 1;  Surgeon: Terry Gray, MD;  Location: [facility];  Service: Urology;  Laterality: N/A;   TONSILLECTOMY          Social History Terry [name]  reports that he has never smoked. He has never used smokeless tobacco. He reports current alcohol use. He reports that he does not use drugs.   family history includes Dementia in his mother; Heart attack in his father; Heart disease in his father; Pneumonia in his mother.        Allergies  Allergen Reactions   Lipitor [Atorvastatin] Other (See Comments)       Muscle issues   Penicillins Hives, Itching and Other (See Comments)      Has patient had a PCN reaction causing immediate rash, facial/tongue/throat swelling, SOB or lightheadedness with hypotension: No Has patient had a PCN reaction causing SEVERE RASH INVOLVING MUCUS MEMBRANES or SKIN NECROSIS: #  #  #  YES  #  #  #  Has patient had a PCN reaction that required hospitalization No Has patient had a PCN reaction occurring within the last 10 years: No            PHYSICAL EXAMINATION: Vital signs: BP 130/68   Pulse 76   Ht '5\' 7"'$  (1.702 m)   Wt 171 lb 12.8 oz (77.9 kg)   SpO2 98%   BMI 26.91 kg/m   Constitutional: generally well-appearing, no acute distress Psychiatric: alert and oriented x3, cooperative Eyes: extraocular movements intact, anicteric, conjunctiva pink Mouth: oral pharynx moist, no lesions Neck: supple no lymphadenopathy Cardiovascular: heart regular rate and rhythm, no murmur Lungs: clear to auscultation bilaterally Abdomen: soft, nontender, nondistended, no obvious ascites, no peritoneal signs, normal bowel sounds, no organomegaly Rectal: Deferred until colonoscopy Extremities: no clubbing, cyanosis, or lower extremity edema bilaterally Skin: no lesions on visible extremities Neuro: No focal deficits.  Cranial nerves intact   ASSESSMENT:   1.  Hemoccult positive stool.  Normal hemoglobin March 25, 2022 (15.1).  Asymptomatic 2.  Chronic GERD.  Symptoms controlled with PPI. 3.  History of pulmonary embolism March 2023.  On chronic Xarelto therapy.  Okay for short-term interruption per his PCP.  He had short-term interruption for his back surgery. 4.  History of adenomatous colon polyps.  Prior colonoscopy 2006, 2011, and 2016.  Adenomas on first 2 exams.  He also has diverticulosis. 5.  Change in bowel habits.  Likely due to Aricept   PLAN:   1.  Colonoscopy with possible biopsies to evaluate Hemoccult positive stool and change in bowel habits.  The patient is  high risk given his comorbidities and the need to address chronic anticoagulation.The nature of the procedure, as well as the risks, benefits, and alternatives were carefully and thoroughly reviewed with the patient. Ample time for discussion and questions allowed. The patient understood, was satisfied, and agreed to proceed. 2.  Upper endoscopy to evaluate Hemoccult positive stool chronic GERD symptoms.  High risk as above.The nature of the procedure, as well as the risks, benefits, and alternatives were carefully and thoroughly reviewed with the patient. Ample time for discussion and questions allowed. The patient understood, was satisfied, and agreed to proceed. 3.  Continue omeprazole 20 mg  daily.  Medication risks and benefits reviewed. 4.  Hold anticoagulation 2 days preprocedure.  Anticipate resumption immediately post procedure pending findings. 5.  Ongoing general medical care with Dr. Virgina Jock

## 2022-06-10 NOTE — Progress Notes (Signed)
Called to room to assist during endoscopic procedure.  Patient ID and intended procedure confirmed with present staff. Received instructions for my participation in the procedure from the performing physician.  

## 2022-06-10 NOTE — Patient Instructions (Signed)
Please read handouts provided. Continue present medications. Resume Xarelto today. Continue Omeprazole 20 mg daily. Return to care of Dr. Virgina Jock. GI follow-up as needed. Await pathology results.   YOU HAD AN ENDOSCOPIC PROCEDURE TODAY AT [facility]:   Refer to the procedure report that was given to you for any specific questions about what was found during the examination.  If the procedure report does not answer your questions, please call your gastroenterologist to clarify.  If you requested that your care partner not be given the details of your procedure findings, then the procedure report has been included in a sealed envelope for you to review at your convenience later.  YOU SHOULD EXPECT: Some feelings of bloating in the abdomen. Passage of more gas than usual.  Walking can help get rid of the air that was put into your GI tract during the procedure and reduce the bloating. If you had a lower endoscopy (such as a colonoscopy or flexible sigmoidoscopy) you may notice spotting of blood in your stool or on the toilet paper. If you underwent a bowel prep for your procedure, you may not have a normal bowel movement for a few days.  Please Note:  You might notice some irritation and congestion in your nose or some drainage.  This is from the oxygen used during your procedure.  There is no need for concern and it should clear up in a day or so.  SYMPTOMS TO REPORT IMMEDIATELY:  Following lower endoscopy (colonoscopy or flexible sigmoidoscopy):  Excessive amounts of blood in the stool  Significant tenderness or worsening of abdominal pains  Swelling of the abdomen that is new, acute  Fever of 100F or higher  Following upper endoscopy (EGD)  Vomiting of blood or coffee ground material  New chest pain or pain under the shoulder blades  Painful or persistently difficult swallowing  New shortness of breath  Fever of 100F or higher  Black, tarry-looking stools  For urgent  or emergent issues, a gastroenterologist can be reached at any hour by calling [phone]. Do not use MyChart messaging for urgent concerns.    DIET:  We do recommend a small meal at first, but then you may proceed to your regular diet.  Drink plenty of fluids but you should avoid alcoholic beverages for 24 hours.  ACTIVITY:  You should plan to take it easy for the rest of today and you should NOT DRIVE or use heavy machinery until tomorrow (because of the sedation medicines used during the test).    FOLLOW UP: Our staff will call the number listed on your records the next business day following your procedure.  We will call around 7:15- 8:00 am to check on you and address any questions or concerns that you may have regarding the information given to you following your procedure. If we do not reach you, we will leave a message.     If any biopsies were taken you will be contacted by phone or by letter within the next 1-3 weeks.  Please call us at [phone] if you have not heard about the biopsies in 3 weeks.    SIGNATURES/CONFIDENTIALITY: You and/or your care partner have signed paperwork which will be entered into your electronic medical record.  These signatures attest to the fact that that the information above on your After Visit Summary has been reviewed and is understood.  Full responsibility of the confidentiality of this discharge information lies with you and/or your care-partner.

## 2022-06-11 ENCOUNTER — Telehealth: Payer: Self-pay

## 2022-06-11 NOTE — Telephone Encounter (Signed)
Follow up call to pt, no answer. 

## 2022-06-16 ENCOUNTER — Encounter: Payer: Self-pay | Admitting: Internal Medicine

## 2022-06-24 ENCOUNTER — Ambulatory Visit (HOSPITAL_BASED_OUTPATIENT_CLINIC_OR_DEPARTMENT_OTHER)
Admission: RE | Admit: 2022-06-24 | Discharge: 2022-06-24 | Disposition: A | Discharge status: Home / Self Care | Payer: Medicare Other | Source: Ambulatory Visit | Attending: Cardiovascular Disease | Admitting: Cardiovascular Disease

## 2022-06-24 DIAGNOSIS — Z8249 Family history of ischemic heart disease and other diseases of the circulatory system: Secondary | ICD-10-CM | POA: Insufficient documentation

## 2022-06-24 DIAGNOSIS — I2782 Chronic pulmonary embolism: Secondary | ICD-10-CM | POA: Insufficient documentation

## 2022-06-24 DIAGNOSIS — Z01818 Encounter for other preprocedural examination: Secondary | ICD-10-CM | POA: Insufficient documentation

## 2022-07-08 ENCOUNTER — Telehealth: Payer: Self-pay

## 2022-07-08 DIAGNOSIS — I2584 Coronary atherosclerosis due to calcified coronary lesion: Secondary | ICD-10-CM

## 2022-07-08 DIAGNOSIS — Z8249 Family history of ischemic heart disease and other diseases of the circulatory system: Secondary | ICD-10-CM

## 2022-07-08 DIAGNOSIS — R931 Abnormal findings on diagnostic imaging of heart and coronary circulation: Secondary | ICD-10-CM

## 2022-07-08 NOTE — Telephone Encounter (Signed)
-----   Message from Lorretta Harp, MD sent at 06/25/2022  9:20 AM EST ----- Please order coronary CTA

## 2022-07-08 NOTE — Telephone Encounter (Signed)
Spoke with pt regarding results of coronary calcium score. Per Dr. Gwenlyn Found would like pt to have coronary CTA to further define pt's anatomy. Pt is agreeable to plan. Orders placed for coronary CTA. Instructions given and send via mychart. Pt plans to have blood work done next week when he is in town for another appointment. Pt verbalizes understanding.

## 2022-07-09 DIAGNOSIS — R931 Abnormal findings on diagnostic imaging of heart and coronary circulation: Secondary | ICD-10-CM | POA: Diagnosis not present

## 2022-07-09 DIAGNOSIS — Z8249 Family history of ischemic heart disease and other diseases of the circulatory system: Secondary | ICD-10-CM | POA: Diagnosis not present

## 2022-07-10 LAB — BASIC METABOLIC PANEL
BUN/Creatinine Ratio: 20 (ref 10–24)
BUN: 19 mg/dL (ref 8–27)
CO2: 19 mmol/L — ABNORMAL LOW (ref 20–29)
Calcium: 9.2 mg/dL (ref 8.6–10.2)
Chloride: 102 mmol/L (ref 96–106)
Creatinine, Ser: 0.95 mg/dL (ref 0.76–1.27)
Glucose: 138 mg/dL — ABNORMAL HIGH (ref 70–99)
Potassium: 4.2 mmol/L (ref 3.5–5.2)
Sodium: 140 mmol/L (ref 134–144)
eGFR: 85 mL/min/{1.73_m2} (ref >59)

## 2022-07-14 ENCOUNTER — Telehealth (HOSPITAL_COMMUNITY): Payer: Self-pay | Admitting: Emergency Medicine

## 2022-07-14 DIAGNOSIS — H35371 Puckering of macula, right eye: Secondary | ICD-10-CM | POA: Diagnosis not present

## 2022-07-14 DIAGNOSIS — H43822 Vitreomacular adhesion, left eye: Secondary | ICD-10-CM | POA: Diagnosis not present

## 2022-07-14 NOTE — Telephone Encounter (Signed)
Attempted to call patient regarding upcoming cardiac CT appointment. °Left message on voicemail with name and callback number °Cylie Dor RN Navigator Cardiac Imaging °[facility] °[phone] Office °[phone] Cell ° °

## 2022-07-15 ENCOUNTER — Other Ambulatory Visit: Payer: Self-pay | Admitting: Cardiovascular Disease

## 2022-07-15 ENCOUNTER — Telehealth: Payer: Self-pay

## 2022-07-15 ENCOUNTER — Ambulatory Visit (HOSPITAL_COMMUNITY)
Admission: RE | Admit: 2022-07-15 | Discharge: 2022-07-15 | Disposition: A | Discharge status: Home / Self Care | Payer: Medicare Other | Source: Ambulatory Visit | Attending: Cardiovascular Disease | Admitting: Cardiovascular Disease

## 2022-07-15 DIAGNOSIS — I2584 Coronary atherosclerosis due to calcified coronary lesion: Secondary | ICD-10-CM | POA: Insufficient documentation

## 2022-07-15 DIAGNOSIS — R931 Abnormal findings on diagnostic imaging of heart and coronary circulation: Secondary | ICD-10-CM

## 2022-07-15 DIAGNOSIS — I2510 Atherosclerotic heart disease of native coronary artery without angina pectoris: Secondary | ICD-10-CM | POA: Diagnosis not present

## 2022-07-15 DIAGNOSIS — Z8249 Family history of ischemic heart disease and other diseases of the circulatory system: Secondary | ICD-10-CM | POA: Diagnosis not present

## 2022-07-15 MED ORDER — METOPROLOL TARTRATE 5 MG/5ML IV SOLN
INTRAVENOUS | Status: AC
  Administered 2022-07-15: 5 mg via INTRAVENOUS
  Filled 2022-07-15: qty 10

## 2022-07-15 MED ORDER — NITROGLYCERIN 0.4 MG SL SUBL
SUBLINGUAL_TABLET | SUBLINGUAL | Status: AC
  Administered 2022-07-15: 0.8 mg via SUBLINGUAL
  Filled 2022-07-15: qty 2

## 2022-07-15 MED ORDER — NITROGLYCERIN 0.4 MG SL SUBL: 0.8000 mg | SUBLINGUAL_TABLET | SUBLINGUAL | Status: DC | PRN

## 2022-07-15 MED ORDER — IOHEXOL 350 MG/ML SOLN
100.0000 mL | Freq: Once | INTRAVENOUS | Status: AC | PRN
  Administered 2022-07-15: 100 mL via INTRAVENOUS

## 2022-07-15 MED ORDER — METOPROLOL TARTRATE 5 MG/5ML IV SOLN
5.0000 mg | INTRAVENOUS | Status: DC | PRN
  Administered 2022-07-15: 5 mg via INTRAVENOUS

## 2022-07-15 NOTE — Telephone Encounter (Signed)
Attempted to call pt's phone, voicemail box is full.  Also, called wife's [name]) cell, left message to call back.

## 2022-07-16 ENCOUNTER — Ambulatory Visit (HOSPITAL_BASED_OUTPATIENT_CLINIC_OR_DEPARTMENT_OTHER)
Admission: RE | Admit: 2022-07-16 | Discharge: 2022-07-16 | Disposition: A | Discharge status: Home / Self Care | Payer: Medicare Other | Source: Ambulatory Visit | Attending: Cardiovascular Disease | Admitting: Cardiovascular Disease

## 2022-07-16 DIAGNOSIS — R931 Abnormal findings on diagnostic imaging of heart and coronary circulation: Secondary | ICD-10-CM | POA: Diagnosis not present

## 2022-07-16 DIAGNOSIS — Z8249 Family history of ischemic heart disease and other diseases of the circulatory system: Secondary | ICD-10-CM | POA: Diagnosis not present

## 2022-07-16 DIAGNOSIS — I2510 Atherosclerotic heart disease of native coronary artery without angina pectoris: Secondary | ICD-10-CM | POA: Diagnosis not present

## 2022-07-16 DIAGNOSIS — I2584 Coronary atherosclerosis due to calcified coronary lesion: Secondary | ICD-10-CM | POA: Diagnosis not present

## 2022-07-16 NOTE — Telephone Encounter (Signed)
Spoke with pt regarding abnormal CCTA. Appointment scheduled for tomorrow (2/16) at [facility] to double book per Dr. Gwenlyn Found. Pt verbalizes understanding.

## 2022-07-17 ENCOUNTER — Encounter: Payer: Self-pay | Admitting: Cardiovascular Disease

## 2022-07-17 ENCOUNTER — Ambulatory Visit: Payer: Medicare Other | Attending: Cardiovascular Disease | Admitting: Cardiovascular Disease

## 2022-07-17 VITALS — BP 137/82 | HR 63 | Ht 67.0 in | Wt 164.2 lb

## 2022-07-17 DIAGNOSIS — Z8249 Family history of ischemic heart disease and other diseases of the circulatory system: Secondary | ICD-10-CM | POA: Diagnosis not present

## 2022-07-17 DIAGNOSIS — E782 Mixed hyperlipidemia: Secondary | ICD-10-CM | POA: Diagnosis not present

## 2022-07-17 DIAGNOSIS — I25118 Atherosclerotic heart disease of native coronary artery with other forms of angina pectoris: Secondary | ICD-10-CM | POA: Insufficient documentation

## 2022-07-17 DIAGNOSIS — I2699 Other pulmonary embolism without acute cor pulmonale: Secondary | ICD-10-CM | POA: Diagnosis not present

## 2022-07-17 DIAGNOSIS — R931 Abnormal findings on diagnostic imaging of heart and coronary circulation: Secondary | ICD-10-CM | POA: Diagnosis not present

## 2022-07-17 DIAGNOSIS — I2510 Atherosclerotic heart disease of native coronary artery without angina pectoris: Secondary | ICD-10-CM | POA: Insufficient documentation

## 2022-07-17 MED ORDER — ROSUVASTATIN CALCIUM 5 MG PO TABS: 5.0000 mg | ORAL_TABLET | Freq: Every day | ORAL | 3 refills | Status: DC

## 2022-07-17 MED ORDER — ASPIRIN 81 MG PO TBEC: 81.0000 mg | DELAYED_RELEASE_TABLET | Freq: Every day | ORAL | Status: AC

## 2022-07-17 NOTE — Patient Instructions (Signed)
Medication Instructions:  Your physician has recommended you make the following change in your medication:   -Start aspirin 55m EC  -Start rosuvastatin (crestor) 541monce daily.   *If you need a refill on your cardiac medications before your next appointment, please call your pharmacy*   Lab Work: Your physician recommends that you have labs drawn today: CBC  If you have labs (blood work) drawn today and your tests are completely normal, you will receive your results only by: My[place]if you have MyChart) OR A paper copy in the mail If you have any lab test that is abnormal or we need to change your treatment, we will call you to review the results.   Testing/Procedures: See below   Follow-Up: [facility].  As part of our continuing mission to provide you with exceptional heart care, we have created designated Provider Care Teams.  These Care Teams include your primary Cardiologist (physician) and Advanced Practice Providers (APPs -  Physician Assistants and Nurse Practitioners) who all work together to provide you with the care you need, when you need it.  We recommend signing up for the patient portal called "MyChart".  Sign up information is provided on this After Visit Summary.  MyChart is used to connect with patients for Virtual Visits (Telemedicine).  Patients are able to view lab/test results, encounter notes, upcoming appointments, etc.  Non-urgent messages can be sent to your provider as well.   To learn more about what you can do with MyChart, go to [email or web address]   Your next appointment:   2-3 week(s) after cath procedure ( )  Provider:   JoQuay BurowMD   Other Instructions       Cardiac/Peripheral Catheterization   You are scheduled for a Cardiac Catheterization on Monday, February 19 with Dr. JoQuay Burow 1. [facility] [id number] [facility] at 5:30 AM (This time is two hours before your procedure to ensure your preparation). Free valet parking service is available. You will check in at ADMITTING. The support person will be asked to wait in the waiting room.  It is OK to have someone drop you off and come back when you are ready to be discharged.        Special note: Every effort is made to have your procedure done on time. Please understand that emergencies sometimes delay scheduled procedures.   . 2. Diet: Do not eat solid foods after midnight.  You may have clear liquids until 5 AM the day of the procedure.  3. Labs: You will need to have blood drawn today.  4. Medication instructions in preparation for your procedure:   Stop taking Xarelto (Rivaroxaban) on Saturday, February 17.  On the morning of your procedure, take Aspirin 81 mg and any morning medicines NOT listed above.  You may use sips of water.  5. Plan to go home the same day, you will only stay overnight if medically necessary. 6. You MUST have a responsible adult to drive you home. 7. An adult MUST be with you the first 24 hours after you arrive home. 8. Bring a current list of your medications, and the last time and date medication taken. 9. Bring ID and current insurance cards. 10.Please wear clothes that are easy to get on and off and wear slip-on shoes.  Thank you for allowing  [place] to care for you!   -- [facility]

## 2022-07-17 NOTE — Progress Notes (Signed)
07/17/2022 [name]   [birth year]  [id number]  Primary Physician Shon Baton, MD Primary Cardiologist: Lorretta Harp MD Lupe Carney, [place]  HPI:  Terry [name] is a 72 y.o.  thin and fit appearing married Caucasian male father of 2 children, grandfather of 2 grandchildren referred by Dr. Henrene Pastor, gastroenterology, for preoperative clearance before his scheduled endoscopy.  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  I last saw him in the office 05/05/2022.  He has no cardiac risk factors other than family history with a father that died at age 70 of myocardial infarction.  He is never had a heart attack or stroke.  He did have a pulmonary embolism 5 years ago and has been on Xarelto since.  He was an active runner for a long time and has had multiple back surgeries most recently by Dr. Ellene Route 6 weeks ago for which she stopped his Xarelto 2 days before.  He is very active but has complained of some exertional chest tightness.  Since I saw him 2 months ago I did get a coronary CTA on 07/15/2022 revealing a coronary calcium score of 440 with significant disease in his RCA by FFR analysis.  Based on this, we decided to proceed with outpatient radial diagnostic coronary angiography.  I have asked him to hold his Xarelto 2 days before the procedure.   Current Meds  Medication Sig   acetaminophen (TYLENOL) 500 MG tablet Take 500 mg by mouth daily.   carbidopa-levodopa (SINEMET IR) 25-100 MG tablet Take 1 tablet by mouth 3 (three) times daily.   cetirizine (ZYRTEC) 10 MG tablet Take 10 mg by mouth daily as needed for allergies.   cyanocobalamin (VITAMIN B12) 1000 MCG tablet Take 1,000 mcg by mouth daily.   diclofenac Sodium (VOLTAREN) 1 % GEL Apply 2 g topically daily as needed (lower back pain).   donepezil (ARICEPT) 10 MG tablet Take 1 tablet (10 mg total) by mouth at bedtime.   gabapentin (NEURONTIN) 300 MG capsule Take 300 mg by mouth daily.   MELATONIN PO  Take 2 tablets by mouth at bedtime.   methocarbamol (ROBAXIN) 500 MG tablet Take 1 tablet (500 mg total) by mouth every 6 (six) hours as needed for muscle spasms.   omeprazole (PRILOSEC OTC) 20 MG tablet Take 20 mg by mouth every morning.   oxyCODONE-acetaminophen (PERCOCET/ROXICET) 5-325 MG tablet Take 1-2 tablets by mouth every 6 (six) hours as needed for moderate pain or severe pain.   Polyvinyl Alcohol-Povidone PF (REFRESH) 1.4-0.6 % SOLN Place 1 drop into both eyes daily as needed (luberication and dry eyes).   rivaroxaban (XARELTO) 20 MG TABS tablet Take 20 mg by mouth daily with supper.     Allergies  Allergen Reactions   Lipitor [Atorvastatin] Other (See Comments)    Muscle issues   Penicillins Hives, Itching and Other (See Comments)    Has patient had a PCN reaction causing immediate rash, facial/tongue/throat swelling, SOB or lightheadedness with hypotension: No Has patient had a PCN reaction causing SEVERE RASH INVOLVING MUCUS MEMBRANES or SKIN NECROSIS: #  #  #  YES  #  #  #  Has patient had a PCN reaction that required hospitalization No Has patient had a PCN reaction occurring within the last 10 years: No     Social History   Socioeconomic History   Marital status: Married    Spouse name: [name]   Number of children: 2  Years of education: Not on file   Highest education level: Not on file  Occupational History   Occupation: retired  Tobacco Use   Smoking status: Never   Smokeless tobacco: Never  Vaping Use   Vaping Use: Never used  Substance and Sexual Activity   Alcohol use: Yes    Alcohol/week: 0.0 standard drinks of alcohol    Comment: occ   Drug use: Never   Sexual activity: Not Currently    Comment: vasectomy  Other Topics Concern   Not on file  Social History Narrative   Not on file   Social Determinants of Health   Financial Resource Strain: Not on file  Food Insecurity: Not on file  Transportation Needs: Not on file  Physical Activity: Not on  file  Stress: Not on file  Social Connections: Not on file  Intimate Partner Violence: Not on file     Review of Systems: General: negative for chills, fever, night sweats or weight changes.  Cardiovascular: negative for chest pain, dyspnea on exertion, edema, orthopnea, palpitations, paroxysmal nocturnal dyspnea or shortness of breath Dermatological: negative for rash Respiratory: negative for cough or wheezing Urologic: negative for hematuria Abdominal: negative for nausea, vomiting, diarrhea, bright red blood per rectum, melena, or hematemesis Neurologic: negative for visual changes, syncope, or dizziness All other systems reviewed and are otherwise negative except as noted above.    Blood pressure 137/82, pulse 63, height 5' 7"$  (1.702 m), weight 164 lb 3.2 oz (74.5 kg), SpO2 97 %.  General appearance: alert and no distress Neck: no adenopathy, no carotid bruit, no JVD, supple, symmetrical, trachea midline, and thyroid not enlarged, symmetric, no tenderness/mass/nodules Lungs: clear to auscultation bilaterally Heart: regular rate and rhythm, S1, S2 normal, no murmur, click, rub or gallop Extremities: extremities normal, atraumatic, no cyanosis or edema Pulses: 2+ and symmetric Skin: Skin color, texture, turgor normal. No rashes or lesions Neurologic: Grossly normal  EKG sinus rhythm at 63 without ST or T wave changes.  I personally reviewed this EKG  ASSESSMENT AND PLAN:   Pulmonary emboli (HCC) History of pulmonary embolism 5 years ago on Xarelto oral anticoagulation.  Family history of heart disease Family history of heart disease with father who died of a myocardial infarction at age 46.  Hyperlipidemia History of hyperlipidemia not on statin therapy with lipid profile performed 04/08/2022 revealing a total cholesterol 214, LDL 102 and HDL of 34.  Based on this and he is elevated coronary calcium score I am going to begin him on atorvastatin 40 mg a day with an LDL goal of  less than 70  Coronary artery disease Mr. Heber was complaining of effort angina.  Based on this I ordered a coronary CTA performed 07/15/2022 revealing a coronary calcium score of 440 with significant disease in the RCA by FFR analysis.  Based on this, we decided to proceed with outpatient radial diagnostic coronary angiography to define his anatomy.  I have asked him to begin a baby aspirin.The patient understands that risks included but are not limited to stroke (1 in 1000), death (1 in 50), kidney failure [usually temporary] (1 in 500), bleeding (1 in 200), allergic reaction [possibly serious] (1 in 200).  The patient understands and agrees to proceed      Lorretta Harp MD [facility], [facility] 07/17/2022 9:36 AM

## 2022-07-17 NOTE — H&P (View-Only) (Signed)
07/17/2022 Terry [name]   1950-09-18  [id number]  Primary Physician Shon Baton, MD Primary Cardiologist: Lorretta Harp MD Lupe Carney, [place]  HPI:  Terry [name] is a 72 y.o.  thin and fit appearing married Caucasian male father of 2 children, grandfather of 2 grandchildren referred by Dr. Henrene Pastor, gastroenterology, for preoperative clearance before his scheduled endoscopy.  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  I last saw him in the office 05/05/2022.  He has no cardiac risk factors other than family history with a father that died at age 71 of myocardial infarction.  He is never had a heart attack or stroke.  He did have a pulmonary embolism 5 years ago and has been on Xarelto since.  He was an active runner for a long time and has had multiple back surgeries most recently by Dr. Ellene Route 6 weeks ago for which she stopped his Xarelto 2 days before.  He is very active but has complained of some exertional chest tightness.  Since I saw him 2 months ago I did get a coronary CTA on 07/15/2022 revealing a coronary calcium score of 440 with significant disease in his RCA by FFR analysis.  Based on this, we decided to proceed with outpatient radial diagnostic coronary angiography.  I have asked him to hold his Xarelto 2 days before the procedure.   Current Meds  Medication Sig   acetaminophen (TYLENOL) 500 MG tablet Take 500 mg by mouth daily.   carbidopa-levodopa (SINEMET IR) 25-100 MG tablet Take 1 tablet by mouth 3 (three) times daily.   cetirizine (ZYRTEC) 10 MG tablet Take 10 mg by mouth daily as needed for allergies.   cyanocobalamin (VITAMIN B12) 1000 MCG tablet Take 1,000 mcg by mouth daily.   diclofenac Sodium (VOLTAREN) 1 % GEL Apply 2 g topically daily as needed (lower back pain).   donepezil (ARICEPT) 10 MG tablet Take 1 tablet (10 mg total) by mouth at bedtime.   gabapentin (NEURONTIN) 300 MG capsule Take 300 mg by mouth daily.   MELATONIN PO  Take 2 tablets by mouth at bedtime.   methocarbamol (ROBAXIN) 500 MG tablet Take 1 tablet (500 mg total) by mouth every 6 (six) hours as needed for muscle spasms.   omeprazole (PRILOSEC OTC) 20 MG tablet Take 20 mg by mouth every morning.   oxyCODONE-acetaminophen (PERCOCET/ROXICET) 5-325 MG tablet Take 1-2 tablets by mouth every 6 (six) hours as needed for moderate pain or severe pain.   Polyvinyl Alcohol-Povidone PF (REFRESH) 1.4-0.6 % SOLN Place 1 drop into both eyes daily as needed (luberication and dry eyes).   rivaroxaban (XARELTO) 20 MG TABS tablet Take 20 mg by mouth daily with supper.     Allergies  Allergen Reactions   Lipitor [Atorvastatin] Other (See Comments)    Muscle issues   Penicillins Hives, Itching and Other (See Comments)    Has patient had a PCN reaction causing immediate rash, facial/tongue/throat swelling, SOB or lightheadedness with hypotension: No Has patient had a PCN reaction causing SEVERE RASH INVOLVING MUCUS MEMBRANES or SKIN NECROSIS: #  #  #  YES  #  #  #  Has patient had a PCN reaction that required hospitalization No Has patient had a PCN reaction occurring within the last 10 years: No     Social History   Socioeconomic History   Marital status: Married    Spouse name: [name]   Number of children: 2  Years of education: Not on file   Highest education level: Not on file  Occupational History   Occupation: retired  Tobacco Use   Smoking status: Never   Smokeless tobacco: Never  Vaping Use   Vaping Use: Never used  Substance and Sexual Activity   Alcohol use: Yes    Alcohol/week: 0.0 standard drinks of alcohol    Comment: occ   Drug use: Never   Sexual activity: Not Currently    Comment: vasectomy  Other Topics Concern   Not on file  Social History Narrative   Not on file   Social Determinants of Health   Financial Resource Strain: Not on file  Food Insecurity: Not on file  Transportation Needs: Not on file  Physical Activity: Not on  file  Stress: Not on file  Social Connections: Not on file  Intimate Partner Violence: Not on file     Review of Systems: General: negative for chills, fever, night sweats or weight changes.  Cardiovascular: negative for chest pain, dyspnea on exertion, edema, orthopnea, palpitations, paroxysmal nocturnal dyspnea or shortness of breath Dermatological: negative for rash Respiratory: negative for cough or wheezing Urologic: negative for hematuria Abdominal: negative for nausea, vomiting, diarrhea, bright red blood per rectum, melena, or hematemesis Neurologic: negative for visual changes, syncope, or dizziness All other systems reviewed and are otherwise negative except as noted above.    Blood pressure 137/82, pulse 63, height 5' 7"$  (1.702 m), weight 164 lb 3.2 oz (74.5 kg), SpO2 97 %.  General appearance: alert and no distress Neck: no adenopathy, no carotid bruit, no JVD, supple, symmetrical, trachea midline, and thyroid not enlarged, symmetric, no tenderness/mass/nodules Lungs: clear to auscultation bilaterally Heart: regular rate and rhythm, S1, S2 normal, no murmur, click, rub or gallop Extremities: extremities normal, atraumatic, no cyanosis or edema Pulses: 2+ and symmetric Skin: Skin color, texture, turgor normal. No rashes or lesions Neurologic: Grossly normal  EKG sinus rhythm at 63 without ST or T wave changes.  I personally reviewed this EKG  ASSESSMENT AND PLAN:   Pulmonary emboli (HCC) History of pulmonary embolism 5 years ago on Xarelto oral anticoagulation.  Family history of heart disease Family history of heart disease with father who died of a myocardial infarction at age 2.  Hyperlipidemia History of hyperlipidemia not on statin therapy with lipid profile performed 04/08/2022 revealing a total cholesterol 214, LDL 102 and HDL of 34.  Based on this and he is elevated coronary calcium score I am going to begin him on atorvastatin 40 mg a day with an LDL goal of  less than 70  Coronary artery disease Mr. Bossier was complaining of effort angina.  Based on this I ordered a coronary CTA performed 07/15/2022 revealing a coronary calcium score of 440 with significant disease in the RCA by FFR analysis.  Based on this, we decided to proceed with outpatient radial diagnostic coronary angiography to define his anatomy.  I have asked him to begin a baby aspirin.The patient understands that risks included but are not limited to stroke (1 in 1000), death (1 in 30), kidney failure [usually temporary] (1 in 500), bleeding (1 in 200), allergic reaction [possibly serious] (1 in 200).  The patient understands and agrees to proceed      Lorretta Harp MD [facility], [facility] 07/17/2022 9:36 AM

## 2022-07-17 NOTE — Assessment & Plan Note (Signed)
History of hyperlipidemia not on statin therapy with lipid profile performed 04/08/2022 revealing a total cholesterol 214, LDL 102 and HDL of 34.  Based on this and he is elevated coronary calcium score I am going to begin him on atorvastatin 40 mg a day with an LDL goal of less than 70

## 2022-07-17 NOTE — Assessment & Plan Note (Signed)
Terry [name] was complaining of effort angina.  Based on this I ordered a coronary CTA performed 07/15/2022 revealing a coronary calcium score of 440 with significant disease in the RCA by FFR analysis.  Based on this, we decided to proceed with outpatient radial diagnostic coronary angiography to define his anatomy.  I have asked him to begin a baby aspirin.The patient understands that risks included but are not limited to stroke (1 in 1000), death (1 in 49), kidney failure [usually temporary] (1 in 500), bleeding (1 in 200), allergic reaction [possibly serious] (1 in 200).  The patient understands and agrees to proceed

## 2022-07-17 NOTE — Assessment & Plan Note (Signed)
Family history of heart disease with father who died of a myocardial infarction at age 74.

## 2022-07-17 NOTE — Assessment & Plan Note (Addendum)
History of pulmonary embolism 5 years ago on Xarelto oral anticoagulation.

## 2022-07-18 LAB — CBC
Hematocrit: 50.4 % (ref 37.5–51.0)
Hemoglobin: 17.1 g/dL (ref 13.0–17.7)
MCH: 28.7 pg (ref 26.6–33.0)
MCHC: 33.9 g/dL (ref 31.5–35.7)
MCV: 85 fL (ref 79–97)
Platelets: 237 10*3/uL (ref 150–450)
RBC: 5.96 x10E6/uL — ABNORMAL HIGH (ref 4.14–5.80)
RDW: 14.8 % (ref 11.6–15.4)
WBC: 7.7 10*3/uL (ref 3.4–10.8)

## 2022-07-20 ENCOUNTER — Ambulatory Visit (HOSPITAL_COMMUNITY)
Admission: RE | Admit: 2022-07-20 | Discharge: 2022-07-20 | Disposition: A | Discharge status: Home / Self Care | Payer: Medicare Other | Attending: Cardiovascular Disease | Admitting: Cardiovascular Disease

## 2022-07-20 ENCOUNTER — Encounter (HOSPITAL_COMMUNITY)
Admission: RE | Disposition: A | Discharge status: Home / Self Care | Payer: Self-pay | Source: Home / Self Care | Attending: Cardiovascular Disease

## 2022-07-20 ENCOUNTER — Other Ambulatory Visit: Payer: Self-pay

## 2022-07-20 ENCOUNTER — Ambulatory Visit (HOSPITAL_BASED_OUTPATIENT_CLINIC_OR_DEPARTMENT_OTHER): Payer: Medicare Other

## 2022-07-20 DIAGNOSIS — Z7901 Long term (current) use of anticoagulants: Secondary | ICD-10-CM | POA: Diagnosis not present

## 2022-07-20 DIAGNOSIS — I2584 Coronary atherosclerosis due to calcified coronary lesion: Secondary | ICD-10-CM | POA: Diagnosis not present

## 2022-07-20 DIAGNOSIS — I2510 Atherosclerotic heart disease of native coronary artery without angina pectoris: Secondary | ICD-10-CM | POA: Insufficient documentation

## 2022-07-20 DIAGNOSIS — E785 Hyperlipidemia, unspecified: Secondary | ICD-10-CM | POA: Diagnosis not present

## 2022-07-20 DIAGNOSIS — I25118 Atherosclerotic heart disease of native coronary artery with other forms of angina pectoris: Secondary | ICD-10-CM

## 2022-07-20 DIAGNOSIS — Z8249 Family history of ischemic heart disease and other diseases of the circulatory system: Secondary | ICD-10-CM | POA: Diagnosis not present

## 2022-07-20 DIAGNOSIS — Z86711 Personal history of pulmonary embolism: Secondary | ICD-10-CM | POA: Diagnosis not present

## 2022-07-20 DIAGNOSIS — R9439 Abnormal result of other cardiovascular function study: Secondary | ICD-10-CM | POA: Diagnosis present

## 2022-07-20 HISTORY — PX: LEFT HEART CATH AND CORONARY ANGIOGRAPHY: CATH118249

## 2022-07-20 LAB — ECHOCARDIOGRAM COMPLETE
Area-P 1/2: 2.62 cm2
Height: 67.5 in
S' Lateral: 2.3 cm
Weight: 2624 oz

## 2022-07-20 SURGERY — LEFT HEART CATH AND CORONARY ANGIOGRAPHY
Anesthesia: LOCAL

## 2022-07-20 MED ORDER — ACETAMINOPHEN 325 MG PO TABS: 650.0000 mg | ORAL_TABLET | ORAL | Status: DC | PRN

## 2022-07-20 MED ORDER — SODIUM CHLORIDE 0.9 % IV SOLN: 250.0000 mL | INTRAVENOUS | Status: DC | PRN

## 2022-07-20 MED ORDER — HEPARIN SODIUM (PORCINE) 1000 UNIT/ML IJ SOLN
INTRAMUSCULAR | Status: DC | PRN
  Administered 2022-07-20: 4000 [IU] via INTRAVENOUS

## 2022-07-20 MED ORDER — SODIUM CHLORIDE 0.9% FLUSH: 3.0000 mL | Freq: Two times a day (BID) | INTRAVENOUS | Status: DC

## 2022-07-20 MED ORDER — ATORVASTATIN CALCIUM 80 MG PO TABS: 80.0000 mg | ORAL_TABLET | Freq: Every day | ORAL | Status: DC

## 2022-07-20 MED ORDER — HEPARIN (PORCINE) IN NACL 1000-0.9 UT/500ML-% IV SOLN
INTRAVENOUS | Status: DC | PRN
  Administered 2022-07-20 (×2): 500 mL

## 2022-07-20 MED ORDER — ASPIRIN 81 MG PO CHEW: 81.0000 mg | CHEWABLE_TABLET | Freq: Every day | ORAL | Status: DC

## 2022-07-20 MED ORDER — SODIUM CHLORIDE 0.9 % WEIGHT BASED INFUSION
3.0000 mL/kg/h | INTRAVENOUS | Status: AC
  Administered 2022-07-20: 3 mL/kg/h via INTRAVENOUS

## 2022-07-20 MED ORDER — SODIUM CHLORIDE 0.9 % WEIGHT BASED INFUSION: 1.0000 mL/kg/h | INTRAVENOUS | Status: DC

## 2022-07-20 MED ORDER — SODIUM CHLORIDE 0.9% FLUSH: 3.0000 mL | INTRAVENOUS | Status: DC | PRN

## 2022-07-20 MED ORDER — VERAPAMIL HCL 2.5 MG/ML IV SOLN
INTRA_ARTERIAL | Status: DC | PRN
  Administered 2022-07-20: 5 mL via INTRA_ARTERIAL

## 2022-07-20 MED ORDER — ASPIRIN 81 MG PO CHEW: 81.0000 mg | CHEWABLE_TABLET | ORAL | Status: DC

## 2022-07-20 MED ORDER — HEPARIN SODIUM (PORCINE) 1000 UNIT/ML IJ SOLN
INTRAMUSCULAR | Status: AC
  Filled 2022-07-20: qty 10

## 2022-07-20 MED ORDER — LIDOCAINE HCL (PF) 1 % IJ SOLN
INTRAMUSCULAR | Status: AC
  Filled 2022-07-20: qty 30

## 2022-07-20 MED ORDER — ONDANSETRON HCL 4 MG/2ML IJ SOLN: 4.0000 mg | Freq: Four times a day (QID) | INTRAMUSCULAR | Status: DC | PRN

## 2022-07-20 MED ORDER — MORPHINE SULFATE (PF) 2 MG/ML IV SOLN: 2.0000 mg | INTRAVENOUS | Status: DC | PRN

## 2022-07-20 MED ORDER — SODIUM CHLORIDE 0.9 % IV SOLN: INTRAVENOUS | Status: DC

## 2022-07-20 MED ORDER — LABETALOL HCL 5 MG/ML IV SOLN: 10.0000 mg | INTRAVENOUS | Status: DC | PRN

## 2022-07-20 MED ORDER — HYDRALAZINE HCL 20 MG/ML IJ SOLN: 10.0000 mg | INTRAMUSCULAR | Status: DC | PRN

## 2022-07-20 MED ORDER — NITROGLYCERIN 1 MG/10 ML FOR IR/CATH LAB
INTRA_ARTERIAL | Status: AC
  Filled 2022-07-20: qty 10

## 2022-07-20 MED ORDER — VERAPAMIL HCL 2.5 MG/ML IV SOLN
INTRAVENOUS | Status: AC
  Filled 2022-07-20: qty 2

## 2022-07-20 MED ORDER — LIDOCAINE HCL (PF) 1 % IJ SOLN
INTRAMUSCULAR | Status: DC | PRN
  Administered 2022-07-20: 5 mL via INTRADERMAL

## 2022-07-20 MED ORDER — PERFLUTREN LIPID MICROSPHERE
1.0000 mL | INTRAVENOUS | Status: DC | PRN
  Administered 2022-07-20: 2.5 mL via INTRAVENOUS

## 2022-07-20 MED ORDER — IOHEXOL 350 MG/ML SOLN
INTRAVENOUS | Status: DC | PRN
  Administered 2022-07-20: 80 mL

## 2022-07-20 SURGICAL SUPPLY — 11 items
CATH INFINITI JR4 5F (CATHETERS) IMPLANT
CATH OPTITORQUE TIG 4.0 5F (CATHETERS) IMPLANT
DEVICE RAD COMP TR BAND LRG (VASCULAR PRODUCTS) IMPLANT
GLIDESHEATH SLEND A-KIT 6F 22G (SHEATH) IMPLANT
GUIDEWIRE INQWIRE 1.5J.035X260 (WIRE) IMPLANT
INQWIRE 1.5J .035X260CM (WIRE) ×2
KIT HEART LEFT (KITS) ×1 IMPLANT
PACK CARDIAC CATHETERIZATION (CUSTOM PROCEDURE TRAY) ×1 IMPLANT
TRANSDUCER W/STOPCOCK (MISCELLANEOUS) ×1 IMPLANT
TUBING CIL FLEX 10 FLL-RA (TUBING) ×1 IMPLANT
WIRE HI TORQ VERSACORE-J 145CM (WIRE) IMPLANT

## 2022-07-20 NOTE — Discharge Instructions (Signed)

## 2022-07-20 NOTE — Interval H&P Note (Signed)
Cath Lab Visit (complete for each Cath Lab visit)  Clinical Evaluation Leading to the Procedure:   ACS: No.  Non-ACS:    Anginal Classification: CCS I  Anti-ischemic medical therapy: No Therapy  Non-Invasive Test Results: No non-invasive testing performed  Prior CABG: No previous CABG      History and Physical Interval Note:  07/20/2022 7:35 AM  Terry [name]  has presented today for surgery, with the diagnosis of abnormal coronary cta.  The various methods of treatment have been discussed with the patient and family. After consideration of risks, benefits and other options for treatment, the patient has consented to  Procedure(s): LEFT HEART CATH AND CORONARY ANGIOGRAPHY (N/A) as a surgical intervention.  The patient's history has been reviewed, patient examined, no change in status, stable for surgery.  I have reviewed the patient's chart and labs.  Questions were answered to the patient's satisfaction.     Quay Burow

## 2022-07-20 NOTE — Progress Notes (Signed)
Echocardiogram 2D Echocardiogram has been performed.  Oneal Deputy Tyler Robidoux RDCS 07/20/2022, 10:46 AM

## 2022-07-21 ENCOUNTER — Encounter (HOSPITAL_COMMUNITY): Payer: Self-pay | Admitting: Cardiovascular Disease

## 2022-07-23 ENCOUNTER — Institutional Professional Consult (permissible substitution) (INDEPENDENT_AMBULATORY_CARE_PROVIDER_SITE_OTHER): Payer: Medicare Other | Admitting: Cardiothoracic Surgery

## 2022-07-23 VITALS — BP 119/75 | HR 87 | Resp 20 | Ht 67.5 in | Wt 164.0 lb

## 2022-07-23 DIAGNOSIS — I2510 Atherosclerotic heart disease of native coronary artery without angina pectoris: Secondary | ICD-10-CM
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
## 2022-07-24 ENCOUNTER — Encounter: Payer: Self-pay | Admitting: *Deleted

## 2022-07-24 ENCOUNTER — Other Ambulatory Visit: Payer: Self-pay | Admitting: *Deleted

## 2022-07-24 DIAGNOSIS — R9439 Abnormal result of other cardiovascular function study: Secondary | ICD-10-CM

## 2022-07-24 DIAGNOSIS — R7989 Other specified abnormal findings of blood chemistry: Secondary | ICD-10-CM

## 2022-07-24 DIAGNOSIS — I2510 Atherosclerotic heart disease of native coronary artery without angina pectoris: Secondary | ICD-10-CM

## 2022-07-24 DIAGNOSIS — Z5181 Encounter for therapeutic drug level monitoring: Secondary | ICD-10-CM
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
## 2022-07-30 NOTE — Pre-Procedure Instructions (Signed)
Surgical Instructions    Your procedure is scheduled on Tuesday, March 5th.  Report to [facility] Main Entrance "A" at 05:30 A.M., then check in with the Admitting office.  Call this number if you have problems the morning of surgery:  [phone]  If you have any questions prior to your surgery date call [phone]: Open Monday-Friday 8am-4pm If you experience any cold or flu symptoms such as cough, fever, chills, shortness of breath, etc. between now and your scheduled surgery, please notify us at the above number.     Remember:  Do not eat or drink after midnight the night before your surgery     Take these medicines the morning of surgery with A SIP OF WATER  acetaminophen (TYLENOL)  carbidopa-levodopa (SINEMET IR)  cetirizine (ZYRTEC)  omeprazole (PRILOSEC OTC)  rosuvastatin (CRESTOR)    If needed: methocarbamol (ROBAXIN)  Polyvinyl Alcohol-Povidone PF (REFRESH) eye drops   **STOP XARELO FRIDAY 3/1**   As of today, STOP taking any Aleve, Naproxen, Ibuprofen, Motrin, Advil, Goody's, BC's, all herbal medications, fish oil, and all vitamins.                     Do NOT Smoke (Tobacco/Vaping) for 24 hours prior to your procedure.  If you use a CPAP at night, you may bring your mask/headgear for your overnight stay.   Contacts, glasses, piercing's, hearing aid's, dentures or partials may not be worn into surgery, please bring cases for these belongings.    For patients admitted to the hospital, discharge time will be determined by your treatment team.   Patients discharged the day of surgery will not be allowed to drive home, and someone needs to stay with them for 24 hours.  SURGICAL WAITING ROOM VISITATION Patients having surgery or a procedure may have no more than 2 support people in the waiting area - these visitors may rotate.   Children under the age of 32 must have an adult with them who is not the patient. If the patient needs to stay at the hospital during  part of their recovery, the visitor guidelines for inpatient rooms apply. Pre-op nurse will coordinate an appropriate time for 1 support person to accompany patient in pre-op.  This support person may not rotate.   Please refer to the [facility] website for the visitor guidelines for Inpatients (after your surgery is over and you are in a regular room).    Special instructions:   [facility]- Preparing For Surgery  Before surgery, you can play an important role. Because skin is not sterile, your skin needs to be as free of germs as possible. You can reduce the number of germs on your skin by washing with CHG (chlorahexidine gluconate) Soap before surgery.  CHG is an antiseptic cleaner which kills germs and bonds with the skin to continue killing germs even after washing.    Oral Hygiene is also important to reduce your risk of infection.  Remember - BRUSH YOUR TEETH THE MORNING OF SURGERY WITH YOUR REGULAR TOOTHPASTE  Please do not use if you have an allergy to CHG or antibacterial soaps. If your skin becomes reddened/irritated stop using the CHG.  Do not shave (including legs and underarms) for at least 48 hours prior to first CHG shower. It is OK to shave your face.  Please follow these instructions carefully.   Shower the NIGHT BEFORE SURGERY and the MORNING OF SURGERY  If you chose to wash your hair, wash your hair first  as usual with your normal shampoo.  After you shampoo, rinse your hair and body thoroughly to remove the shampoo.  Use CHG Soap as you would any other liquid soap. You can apply CHG directly to the skin and wash gently with a scrungie or a clean washcloth.   Apply the CHG Soap to your body ONLY FROM THE NECK DOWN.  Do not use on open wounds or open sores. Avoid contact with your eyes, ears, mouth and genitals (private parts). Wash Face and genitals (private parts)  with your normal soap.   Wash thoroughly, paying special attention to the area where your surgery will  be performed.  Thoroughly rinse your body with warm water from the neck down.  DO NOT shower/wash with your normal soap after using and rinsing off the CHG Soap.  Pat yourself dry with a CLEAN TOWEL.  Wear CLEAN PAJAMAS to bed the night before surgery  Place CLEAN SHEETS on your bed the night before your surgery  DO NOT SLEEP WITH PETS.   Day of Surgery: Take a shower with CHG soap. Do not wear jewelry Do not wear lotions, powders, colognes, or deodorant. Men may shave face and neck. Do not bring valuables to the hospital. [facility] is not responsible for any belongings or valuables.  Wear Clean/Comfortable clothing the morning of surgery Remember to brush your teeth WITH YOUR REGULAR TOOTHPASTE.   Please read over the following fact sheets that you were given.    If you received a COVID test during your pre-op visit  it is requested that you wear a mask when out in public, stay away from anyone that may not be feeling well and notify your surgeon if you develop symptoms. If you have been in contact with anyone that has tested positive in the last 10 days please notify you surgeon.

## 2022-07-31 ENCOUNTER — Other Ambulatory Visit: Payer: Self-pay

## 2022-07-31 ENCOUNTER — Ambulatory Visit (HOSPITAL_COMMUNITY)
Admission: RE | Admit: 2022-07-31 | Discharge: 2022-07-31 | Disposition: A | Discharge status: Home / Self Care | Payer: Medicare Other | Source: Ambulatory Visit | Attending: Cardiothoracic Surgery | Admitting: Cardiothoracic Surgery

## 2022-07-31 ENCOUNTER — Ambulatory Visit (HOSPITAL_BASED_OUTPATIENT_CLINIC_OR_DEPARTMENT_OTHER)
Admission: RE | Admit: 2022-07-31 | Discharge: 2022-07-31 | Disposition: A | Discharge status: Home / Self Care | Payer: Medicare Other | Source: Ambulatory Visit | Attending: Cardiothoracic Surgery | Admitting: Cardiothoracic Surgery

## 2022-07-31 ENCOUNTER — Encounter (HOSPITAL_COMMUNITY): Payer: Self-pay

## 2022-07-31 ENCOUNTER — Encounter (HOSPITAL_COMMUNITY)
Admission: RE | Admit: 2022-07-31 | Discharge: 2022-07-31 | Disposition: A | Discharge status: Home / Self Care | Payer: Medicare Other | Source: Ambulatory Visit | Attending: Cardiothoracic Surgery | Admitting: Cardiothoracic Surgery

## 2022-07-31 VITALS — BP 135/82 | HR 66 | Temp 97.7°F | Resp 19 | Ht 68.0 in | Wt 163.0 lb

## 2022-07-31 DIAGNOSIS — Z5181 Encounter for therapeutic drug level monitoring: Secondary | ICD-10-CM | POA: Insufficient documentation

## 2022-07-31 DIAGNOSIS — R7989 Other specified abnormal findings of blood chemistry: Secondary | ICD-10-CM

## 2022-07-31 DIAGNOSIS — I2510 Atherosclerotic heart disease of native coronary artery without angina pectoris: Secondary | ICD-10-CM | POA: Diagnosis not present

## 2022-07-31 DIAGNOSIS — Z951 Presence of aortocoronary bypass graft: Secondary | ICD-10-CM | POA: Diagnosis not present

## 2022-07-31 DIAGNOSIS — E785 Hyperlipidemia, unspecified: Secondary | ICD-10-CM | POA: Diagnosis not present

## 2022-07-31 DIAGNOSIS — Z1152 Encounter for screening for COVID-19: Secondary | ICD-10-CM | POA: Diagnosis not present

## 2022-07-31 DIAGNOSIS — Z01818 Encounter for other preprocedural examination: Secondary | ICD-10-CM | POA: Insufficient documentation

## 2022-07-31 HISTORY — DX: Atherosclerotic heart disease of native coronary artery without angina pectoris: I25.10

## 2022-07-31 LAB — COMPREHENSIVE METABOLIC PANEL
ALT: 23 U/L (ref 0–44)
AST: 39 U/L (ref 15–41)
Albumin: 4 g/dL (ref 3.5–5.0)
Alkaline Phosphatase: 57 U/L (ref 38–126)
Anion gap: 14 (ref 5–15)
BUN: 14 mg/dL (ref 8–23)
CO2: 24 mmol/L (ref 22–32)
Calcium: 9.3 mg/dL (ref 8.9–10.3)
Chloride: 100 mmol/L (ref 98–111)
Creatinine, Ser: 1.07 mg/dL (ref 0.61–1.24)
GFR, Estimated: >60 mL/min (ref >60)
Glucose, Bld: 97 mg/dL (ref 70–99)
Potassium: 4.6 mmol/L (ref 3.5–5.1)
Sodium: 138 mmol/L (ref 135–145)
Total Bilirubin: 1.4 mg/dL — ABNORMAL HIGH (ref 0.3–1.2)
Total Protein: 6.7 g/dL (ref 6.5–8.1)

## 2022-07-31 LAB — URINALYSIS, ROUTINE W REFLEX MICROSCOPIC
Bilirubin Urine: NEGATIVE
Glucose, UA: NEGATIVE mg/dL
Hgb urine dipstick: NEGATIVE
Ketones, ur: NEGATIVE mg/dL
Leukocytes,Ua: NEGATIVE
Nitrite: NEGATIVE
Protein, ur: NEGATIVE mg/dL
Specific Gravity, Urine: 1.014 (ref 1.005–1.030)
pH: 5 (ref 5.0–8.0)

## 2022-07-31 LAB — SURGICAL PCR SCREEN
MRSA, PCR: NEGATIVE
Staphylococcus aureus: NEGATIVE

## 2022-07-31 LAB — TYPE AND SCREEN
ABO/RH(D): B NEG
Antibody Screen: NEGATIVE

## 2022-07-31 LAB — CBC
HCT: 51.7 % (ref 39.0–52.0)
Hemoglobin: 17.5 g/dL — ABNORMAL HIGH (ref 13.0–17.0)
MCH: 29.3 pg (ref 26.0–34.0)
MCHC: 33.8 g/dL (ref 30.0–36.0)
MCV: 86.5 fL (ref 80.0–100.0)
Platelets: 239 10*3/uL (ref 150–400)
RBC: 5.98 MIL/uL — ABNORMAL HIGH (ref 4.22–5.81)
RDW: 14.5 % (ref 11.5–15.5)
WBC: 11 10*3/uL — ABNORMAL HIGH (ref 4.0–10.5)
nRBC: 0 % (ref 0.0–0.2)

## 2022-07-31 LAB — APTT: aPTT: 41 seconds — ABNORMAL HIGH (ref 24–36)

## 2022-07-31 LAB — PROTIME-INR
INR: 2.2 — ABNORMAL HIGH (ref 0.8–1.2)
Prothrombin Time: 24.6 seconds — ABNORMAL HIGH (ref 11.4–15.2)

## 2022-07-31 NOTE — Progress Notes (Signed)
PCP: Dr. Virgina Jock Cardiologist: Dr. Gwenlyn Found  EKG: 07-20-22 CXR: Today ECHO: 07-20-22 Stress Test:  Cardiac Cath: 07-20-22  Covid tested today  Unable to obtain ABG, will need drawn DOS. Levonne Spiller RN made aware  Patient denies shortness of breath, fever, cough, and chest pain at PAT appointment.  Patient verbalized understanding of instructions provided today at the PAT appointment.  Patient asked to review instructions at home and day of surgery.

## 2022-07-31 NOTE — H&P (Signed)
[address]       [place] [address]             [phone]                    Budd L Sliva [place] Medical Record [id number] Date of Birth: [birth year]  Referring: Lorretta Harp, MD Primary Care: Shon Baton, MD Primary Cardiologist: None  Chief Complaint:    Chief Complaint  Patient presents with   Coronary Artery Disease    Surgical consult, Cardiac Cath and ECHO 07/20/22    History of Present Illness:    Terry [name] 72 y.o. male with symptomatic CAD and escalating symptoms of angina referred for consideration of CABG   Here are my notes from our encounter. Not clear why they are in strikethrough.    Per recent cardiology HPI Terry [name] is a 72 y.o.  thin and fit appearing married Caucasian male father of 2 children, grandfather of 2 grandchildren referred by Dr. Henrene Pastor, gastroenterology, for preoperative clearance before his scheduled endoscopy.  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  I last saw him in the office 05/05/2022.  He has no cardiac risk factors other than family history with a father that died at age 75 of myocardial infarction.  He is never had a heart attack or stroke.  He did have a pulmonary embolism 5 years ago and has been on Xarelto since.  He was an active runner for a long time and has had multiple back surgeries most recently by Dr. Ellene Route 6 weeks ago for which she stopped his Xarelto 2 days before.  He is very active but has complained of some exertional chest tightness.   Since I saw him 2 months ago I did get a coronary CTA on 07/15/2022 revealing a coronary calcium score of 440 with significant disease in his RCA by FFR analysis.  Based on this, we decided to proceed with outpatient radial diagnostic coronary angiography.  I have asked him to hold his Xarelto 2 days before the procedure.   Past Medical History:  Diagnosis Date   Arthritis    neck, back, knees, right ankle   Bladder  calculus    Chronic back pain    GERD (gastroesophageal reflux disease)    History of colon polyps    benign   History of kidney stones    and bladder stone   History of prostate cancer urologist-- dr Alinda Money   dx 2014---  s/p  prostatectomy 05-22-2013 ,  Gleason 3+3   Hypercholesterolemia    takes Crestor daily   Hyperlipidemia    Pneumonia 10/2019   Pulmonary emboli ([place]) 10/2019   Wears glasses     Past Surgical History:  Procedure Laterality Date   ACHILLES TENDON SURGERY Right 06-07-2018   '@[facility]'$    reconstruction   ANTERIOR CERVICAL DECOMP/DISCECTOMY FUSION N/A 06/27/2014   Procedure: ANTERIOR CERVICAL DECOMPRESSION/DISCECTOMY FUSION C5-C7   (2 LEVELS);  Surgeon: Melina Schools, MD;  Location: [facility];  Service: Orthopedics;  Laterality: N/A;   back fusion  03/2022   CARDIAC CATHETERIZATION  2004   COLONOSCOPY     CYSTOSCOPY WITH LITHOLAPAXY N/A 03/27/2019   Procedure: CYSTOSCOPY WITH REMOVAL OF BLADDER STONE AND FOREIGN BODY;  Surgeon: Raynelle Bring, MD;  Location: [facility];  Service: Urology;  Laterality: N/A;   EYE SURGERY     lazy eye as child, lasik  surgery   FINGER SURGERY Right    4th finger   I & D EXTREMITY Right 07/10/2018   Procedure: IRRIGATION AND DEBRIDEMENT RIGHT ANKLE WOUND AND WOUND VAC PLACEMENT;  Surgeon: Wylene Simmer, MD;  Location: [facility];  Service: Orthopedics;  Laterality: Right;   KNEE ARTHROSCOPY Bilateral right ?;  left 05-25-2007  '@[facility]'$    LEFT HEART CATH AND CORONARY ANGIOGRAPHY N/A 07/20/2022   Procedure: LEFT HEART CATH AND CORONARY ANGIOGRAPHY;  Surgeon: Lorretta Harp, MD;  Location: [facility];  Service: Cardiovascular;  Laterality: N/A;   POSTERIOR LAMINECTOMY / DECOMPRESSION LUMBAR SPINE  08-31-2016    dr elsner  '@[facility]'$    PROSTATE BIOPSY  04/13/2013   gleason 3+3=6, vol 55.4 cc   ROBOT ASSISTED LAPAROSCOPIC RADICAL PROSTATECTOMY N/A 05/22/2013   Procedure: ROBOTIC ASSISTED LAPAROSCOPIC RADICAL PROSTATECTOMY LEVEL 1;   Surgeon: Dutch Gray, MD;  Location: [facility];  Service: Urology;  Laterality: N/A;   TONSILLECTOMY      Family History  Problem Relation Age of Onset   Pneumonia Mother        bacterial   Dementia Mother    Heart disease Father    Heart attack Father    Colon cancer Neg Hx    Sleep apnea Neg Hx      Social History   Tobacco Use  Smoking Status Never  Smokeless Tobacco Never    Social History   Substance and Sexual Activity  Alcohol Use Yes   Alcohol/week: 0.0 standard drinks of alcohol   Comment: occ     Allergies  Allergen Reactions   Lipitor [Atorvastatin] Other (See Comments)    Muscle issues   Penicillins Hives, Itching and Other (See Comments)    Has patient had a PCN reaction causing immediate rash, facial/tongue/throat swelling, SOB or lightheadedness with hypotension: No Has patient had a PCN reaction causing SEVERE RASH INVOLVING MUCUS MEMBRANES or SKIN NECROSIS: #  #  #  YES  #  #  #  Has patient had a PCN reaction that required hospitalization No Has patient had a PCN reaction occurring within the last 10 years: No     Current Outpatient Medications  Medication Sig Dispense Refill   acetaminophen (TYLENOL) 500 MG tablet Take 500 mg by mouth daily.     aspirin EC 81 MG tablet Take 1 tablet (81 mg total) by mouth daily. Swallow whole.     carbidopa-levodopa (SINEMET IR) 25-100 MG tablet Take 1 tablet by mouth 3 (three) times daily. 270 tablet 3   cetirizine (ZYRTEC) 10 MG tablet Take 10 mg by mouth daily.     cyanocobalamin (VITAMIN B12) 1000 MCG tablet Take 1,000 mcg by mouth daily.     donepezil (ARICEPT) 10 MG tablet Take 1 tablet (10 mg total) by mouth at bedtime. 90 tablet 3   gabapentin (NEURONTIN) 300 MG capsule Take 300 mg by mouth at bedtime as needed (pain).     MELATONIN PO Take 2 tablets by mouth at bedtime.     methocarbamol (ROBAXIN) 500 MG tablet Take 1 tablet (500 mg total) by mouth every 6 (six) hours as needed for muscle spasms. 40 tablet  3   omeprazole (PRILOSEC OTC) 20 MG tablet Take 20 mg by mouth in the morning.     Polyvinyl Alcohol-Povidone PF (REFRESH) 1.4-0.6 % SOLN Place 1 drop into both eyes daily as needed (luberication and dry eyes).     rivaroxaban (XARELTO) 20 MG TABS tablet Take 20 mg by mouth daily  with supper.     rosuvastatin (CRESTOR) 5 MG tablet Take 1 tablet (5 mg total) by mouth daily. 90 tablet 3   No current facility-administered medications for this visit.    ROS 14 point ROS reviewed and negative except as per HPI   PHYSICAL EXAMINATION: BP 119/75   Pulse 87   Resp 20   Ht 5' 7.5" (1.715 m)   Wt 164 lb (74.4 kg)   SpO2 95% Comment: RA  BMI 25.31 kg/m   Gen: NAD Neuro: Alert and oriented Resp: Nonlaboured Abd: Soft, ntnd Extr: WWP  Diagnostic Studies & Laboratory data:     Recent Radiology Findings:   ECHOCARDIOGRAM COMPLETE  Result Date: 07/20/2022    ECHOCARDIOGRAM REPORT   Patient Name:   Terry [name] Date of Exam: 07/20/2022 Medical Rec #:  [id number]      Height:       67.5 in Accession #:    [id number]     Weight:       164.0 lb Date of Birth:  Oct 07, 1950      BSA:          1.869 m Patient Age:    56 years       BP:           116/78 mmHg Patient Gender: M              HR:           76 bpm. Exam Location:  Inpatient Procedure: 2D Echo, Color Doppler, Cardiac Doppler and Intracardiac            Opacification Agent Indications:    CAD Native Vessel i25.10  History:        Patient has prior history of Echocardiogram examinations, most                 recent 08/13/2020. CAD; Risk Factors:Hypertension and                 Dyslipidemia.  Sonographer:    Raquel Sarna Senior RDCS Referring Phys: [place] Comments: Unable to reposition due to post cath restrictions. IMPRESSIONS  1. Left ventricular ejection fraction, by estimation, is 60 to 65%. The left ventricle has normal function. The left ventricle has no regional wall motion abnormalities. Left ventricular diastolic  parameters are consistent with Grade I diastolic dysfunction (impaired relaxation).  2. Right ventricular systolic function is normal. The right ventricular size is normal.  3. The mitral valve is normal in structure. No evidence of mitral valve regurgitation. No evidence of mitral stenosis.  4. The aortic valve is tricuspid. There is moderate calcification of the aortic valve. Aortic valve regurgitation is not visualized. No aortic stenosis is present.  5. The inferior vena cava is normal in size with greater than 50% respiratory variability, suggesting right atrial pressure of 3 mmHg. FINDINGS  Left Ventricle: Left ventricular ejection fraction, by estimation, is 60 to 65%. The left ventricle has normal function. The left ventricle has no regional wall motion abnormalities. Definity contrast agent was given IV to delineate the left ventricular  endocardial borders. The left ventricular internal cavity size was normal in size. There is no left ventricular hypertrophy. Left ventricular diastolic parameters are consistent with Grade I diastolic dysfunction (impaired relaxation). Right Ventricle: The right ventricular size is normal. No increase in right ventricular wall thickness. Right ventricular systolic function is normal. Left Atrium: Left atrial size was normal in size. Right Atrium: Right atrial size  was normal in size. Pericardium: There is no evidence of pericardial effusion. Mitral Valve: The mitral valve is normal in structure. Mild mitral annular calcification. No evidence of mitral valve regurgitation. No evidence of mitral valve stenosis. Tricuspid Valve: The tricuspid valve is normal in structure. Tricuspid valve regurgitation is trivial. No evidence of tricuspid stenosis. Aortic Valve: The aortic valve is tricuspid. There is moderate calcification of the aortic valve. Aortic valve regurgitation is not visualized. No aortic stenosis is present. Pulmonic Valve: The pulmonic valve was normal in  structure. Pulmonic valve regurgitation is not visualized. No evidence of pulmonic stenosis. Aorta: The aortic root is normal in size and structure. Venous: The inferior vena cava is normal in size with greater than 50% respiratory variability, suggesting right atrial pressure of 3 mmHg. IAS/Shunts: No atrial level shunt detected by color flow Doppler.  LEFT VENTRICLE PLAX 2D LVIDd:         3.80 cm   Diastology LVIDs:         2.30 cm   LV e' medial:    5.66 cm/s LV PW:         0.70 cm   LV E/e' medial:  11.6 LV IVS:        0.90 cm   LV e' lateral:   7.94 cm/s LVOT diam:     1.90 cm   LV E/e' lateral: 8.2 LV SV:         49 LV SV Index:   26 LVOT Area:     2.84 cm  RIGHT VENTRICLE RV S prime:     12.90 cm/s TAPSE (M-mode): 1.7 cm LEFT ATRIUM           Index        RIGHT ATRIUM          Index LA diam:      2.20 cm 1.18 cm/m   RA Area:     9.54 cm LA Vol (A2C): 31.2 ml 16.69 ml/m  RA Volume:   19.60 ml 10.49 ml/m LA Vol (A4C): 12.0 ml 6.42 ml/m  AORTIC VALVE LVOT Vmax:   86.20 cm/s LVOT Vmean:  64.700 cm/s LVOT VTI:    0.172 m  AORTA Ao Root diam: 3.30 cm Ao Asc diam:  3.70 cm MITRAL VALVE MV Area (PHT): 2.62 cm    SHUNTS MV Decel Time: 289 msec    Systemic VTI:  0.17 m MV E velocity: 65.50 cm/s  Systemic Diam: 1.90 cm MV A velocity: 81.20 cm/s MV E/A ratio:  0.81 Glori Bickers MD Electronically signed by Glori Bickers MD Signature Date/Time: 07/20/2022/1:16:10 PM    Final    CARDIAC CATHETERIZATION  Result Date: 07/20/2022 Images from the original result were not included.   Prox LAD to Mid LAD lesion is 95% stenosed.   Dist RCA lesion is 95% stenosed.   Prox RCA-1 lesion is 90% stenosed.   Prox RCA-2 lesion is 60% stenosed.   The left ventricular systolic function is normal.   LV end diastolic pressure is low.   The left ventricular ejection fraction is 50-55% by visual estimate. Terry [name] is a 72 y.o. male  [id number] LOCATION:  FACILITY: [facility] PHYSICIAN: Quay Burow, M.D. [birth year] DATE OF  PROCEDURE:  07/20/2022 DATE OF DISCHARGE: CARDIAC CATHETERIZATION History obtained from chart review. Terry [name] is a 72 y.o.  thin and fit appearing married Caucasian male father of 2 children, grandfather of 2 grandchildren referred by Dr. Henrene Pastor, gastroenterology, for preoperative clearance before his scheduled  endoscopy.  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  I last saw him in the office 05/05/2022.  He has no cardiac risk factors other than family history with a father that died at age 33 of myocardial infarction.  He is never had a heart attack or stroke.  He did have a pulmonary embolism 5 years ago and has been on Xarelto since.  He was an active runner for a long time and has had multiple back surgeries most recently by Dr. Ellene Route 6 weeks ago for which she stopped his Xarelto 2 days before.  He is very active but has complained of some exertional chest tightness.  Since I saw him 2 months ago I did get a coronary CTA on 07/15/2022 revealing a coronary calcium score of 440 with significant disease in his RCA by FFR analysis.  Based on this, we decided to proceed with outpatient radial diagnostic coronary angiography.  I have asked him to hold his Xarelto 2 days before the procedure.   Mr. Yazzie has two-vessel disease.  He has a 95% proximal LAD stenosis just after a medium sized first diagonal branch.  He also has a 90% proximal RCA stenosis in the middle of a 60% segmental stenosis after the first band and a 95% stenosis distally at the crux after a segment of tortuosity.  He has a small PDA and a larger PLA with normal LV function.  He is not diabetic.  At this point, I am going to arrange for him to see cardiothoracic surgery to explore surgical revascularization options using all arterial conduit grafts.  The sheath was removed and a TR band was placed on the right wrist to achieve patent hemostasis.  The patient left lab in stable condition.  He will be discharged home  later this morning and arrangements will be made for outpatient surgical consult.  I told him to begin his Xarelto tomorrow. Quay Burow. MD, [facility] 07/20/2022 8:24 AM    CT CORONARY MORPH W/CTA COR W/SCORE W/CA W/CM &/OR WO/CM  Addendum Date: 07/18/2022   ADDENDUM REPORT: 07/18/2022 21:59 EXAM: OVER-READ INTERPRETATION  CT CHEST The following report is an over-read performed by radiologist Dr. Inez Catalina of [facility], [place] on 07/18/2022. This over-read does not include interpretation of cardiac or coronary anatomy or pathology. The coronary calcium score/coronary CTA interpretation by the cardiologist is attached. COMPARISON:  06/24/2022 FINDINGS: Cardiovascular: There are no significant extracardiac vascular findings. Mediastinum/Nodes: There are no enlarged lymph nodes within the visualized mediastinum.The esophagus as visualized is within normal limits. Lungs/Pleura: Lungs are well aerated without focal infiltrate. Minimal scarring is noted in the right lower lobe laterally stable from multiple previous exams dating back to 2022. No parenchymal nodules are noted. No effusion is seen. Upper abdomen: No significant findings in the visualized upper abdomen. Musculoskeletal/Chest wall: No chest wall mass or suspicious osseous findings within the visualized chest. IMPRESSION: No significant extracardiac findings within the visualized chest. Electronically Signed   By: Inez Catalina M.D.   On: 07/18/2022 21:59   Result Date: 07/18/2022 CLINICAL DATA:  Chest pain EXAM: Cardiac CTA MEDICATIONS: Sub lingual nitro.  '4mg'$  TECHNIQUE: The patient was scanned on a Siemens Force [id number] slice scanner. Gantry rotation speed was 250 msecs. Collimation was .6 mm. A 120 kV prospective scan was triggered in the ascending thoracic aorta at 140 HU's Full mA was used between 35% and 75% of the R-R interval. Average HR during the scan was 51 bpm.  The 3D data set was interpreted on a dedicated work station using MPR, MIP and  VRT modes. A total of 80 cc of contrast was used. FINDINGS: Non-cardiac: See separate report from [facility]. No significant findings on limited lung and soft tissue windows. Calcium Score: Calcium noted in RCA and LAD LM 0 LAD 363 RCA 77 LCX 0 Total 440 which is 65 th percentile for age/sex Coronary Arteries: Right dominant with no anomalies LM: Normal LAD: 25-49% mixed plaque in proximal and mid vessel IM: Small vessel normal D1: Small vessel normal D2: Large vessel normal disease in LAD at ostium Circumflex: 1-24% soft plaque in proximal vessel OM1: Normal OM2: Normal RCA: Likely sub total occlusion 70-99% in proximal vessel No opacification seen on all phases Distal to this area 25-49% calcified plaque and significant soft plaque in distal vessel PDA: Normal PLA: Normal IMPRESSION: 1. Calcium score 440 which is 65 th percentile for age/sex 2.  Upper normal ascending thoracic aorta diameter 3.7 cm 3. Likely sub total occlusion 70-99% of proximal RCA with high soft plaque burden Study [facility] [place] Electronically Signed: By: Jenkins Rouge M.D. On: 07/15/2022 14:49   CT CORONARY FRACTIONAL FLOW RESERVE FLUID ANALYSIS  Result Date: 07/15/2022 CLINICAL DATA:  CAD EXAM: FFR CT TECHNIQUE: The best systolic and diastolic phases of the patients gated cardiac CTA sent to heart flow for hemodynamic analysis FINDINGS: LM, LAD, LCX all normal RCA markedly abnormal with proximal FFR 0.74 decreasing to 0.50 after a 2nd distal stenosis modeled IMPRESSION: Markedly positive FFR CT involving the proximal RCA [place] Electronically Signed   By: Jenkins Rouge M.D.   On: 07/15/2022 14:52       I have independently reviewed the above radiology studies  and reviewed the findings with the patient.   Recent Lab Findings: Lab Results  Component Value Date   WBC 7.7 07/17/2022   HGB 17.1 07/17/2022   HCT 50.4 07/17/2022   PLT 237 07/17/2022   GLUCOSE 138 (H) 07/09/2022   ALT 25 07/07/2021    AST 24 07/07/2021   NA 140 07/09/2022   K 4.2 07/09/2022   CL 102 07/09/2022   CREATININE 0.95 07/09/2022   BUN 19 07/09/2022   CO2 19 (L) 07/09/2022   TSH 2.520 09/29/2021   INR 1.0 08/12/2020     Assessment / Plan:   38M with symptomatic CAD presents for CAB evaluation.  Plan for CAB in 2 weeks.     Risks/benefits/alternatives were discussed at length (85% straightforward recovery, 12% morbidity [any organ, predominantly age-related issues I.e. tissue quality, 3% mortality]. We discussed it is possible to have extensive bleeding or clotting given history of PE.  Will stop his therapeutic AC 48 hours before surgery.  Offered bridging AC, patient deferred.    Straightforward recovery typically entails 1-2 ICU days, 3-4 days on the floor, seeing me back in clinic at 1 week and 1 month postoperatively. No lifting greater than 30 lbs for 6 weeks. Total recovery expected by 2 months. All questions were asked and answered.     Pierre Bali Janayah Zavada 07/31/2022 8:04 AM

## 2022-08-01 LAB — HEMOGLOBIN A1C
Hgb A1c MFr Bld: 6.2 % — ABNORMAL HIGH (ref 4.8–5.6)
Mean Plasma Glucose: 131 mg/dL

## 2022-08-01 LAB — SARS CORONAVIRUS 2 (TAT 6-24 HRS): SARS Coronavirus 2: NEGATIVE

## 2022-08-03 MED ORDER — TRANEXAMIC ACID (OHS) PUMP PRIME SOLUTION
2.0000 mg/kg | INTRAVENOUS | Status: DC
  Filled 2022-08-03 (×2): qty 1.48

## 2022-08-03 MED ORDER — TRANEXAMIC ACID (OHS) BOLUS VIA INFUSION
15.0000 mg/kg | INTRAVENOUS | Status: AC
  Administered 2022-08-04: 1108.5 mg via INTRAVENOUS
  Filled 2022-08-03: qty 1109

## 2022-08-03 MED ORDER — HEPARIN 30,000 UNITS/1000 ML (OHS) CELLSAVER SOLUTION
Status: DC
  Filled 2022-08-03: qty 1000

## 2022-08-03 MED ORDER — VANCOMYCIN HCL 1000 MG IV SOLR
INTRAVENOUS | Status: DC
  Filled 2022-08-03 (×2): qty 20

## 2022-08-03 MED ORDER — VANCOMYCIN HCL 1250 MG/250ML IV SOLN
1250.0000 mg | INTRAVENOUS | Status: AC
  Administered 2022-08-04: 1250 mg via INTRAVENOUS
  Filled 2022-08-03: qty 250

## 2022-08-03 MED ORDER — MILRINONE LACTATE IN DEXTROSE 20-5 MG/100ML-% IV SOLN
0.3000 ug/kg/min | INTRAVENOUS | Status: DC
  Filled 2022-08-03: qty 100

## 2022-08-03 MED ORDER — CEFAZOLIN SODIUM-DEXTROSE 2-4 GM/100ML-% IV SOLN
2.0000 g | INTRAVENOUS | Status: DC
  Filled 2022-08-03: qty 100

## 2022-08-03 MED ORDER — DEXMEDETOMIDINE HCL IN NACL 400 MCG/100ML IV SOLN
0.1000 ug/kg/h | INTRAVENOUS | Status: AC
  Administered 2022-08-04: .7 ug/kg/h via INTRAVENOUS
  Filled 2022-08-03: qty 100

## 2022-08-03 MED ORDER — POTASSIUM CHLORIDE 2 MEQ/ML IV SOLN
80.0000 meq | INTRAVENOUS | Status: DC
  Filled 2022-08-03: qty 40

## 2022-08-03 MED ORDER — CEFAZOLIN SODIUM-DEXTROSE 2-4 GM/100ML-% IV SOLN
2.0000 g | INTRAVENOUS | Status: AC
  Administered 2022-08-04 (×2): 2 g via INTRAVENOUS
  Filled 2022-08-03: qty 100

## 2022-08-03 MED ORDER — EPINEPHRINE HCL 5 MG/250ML IV SOLN IN NS
0.0000 ug/min | INTRAVENOUS | Status: DC
  Filled 2022-08-03: qty 250

## 2022-08-03 MED ORDER — MAGNESIUM SULFATE 50 % IJ SOLN
40.0000 meq | INTRAMUSCULAR | Status: DC
  Filled 2022-08-03: qty 9.85

## 2022-08-03 MED ORDER — NITROGLYCERIN IN D5W 200-5 MCG/ML-% IV SOLN
2.0000 ug/min | INTRAVENOUS | Status: DC
  Filled 2022-08-03: qty 250

## 2022-08-03 MED ORDER — NOREPINEPHRINE 4 MG/250ML-% IV SOLN
0.0000 ug/min | INTRAVENOUS | Status: DC
  Filled 2022-08-03: qty 250

## 2022-08-03 MED ORDER — TRANEXAMIC ACID 1000 MG/10ML IV SOLN
1.5000 mg/kg/h | INTRAVENOUS | Status: AC
  Administered 2022-08-04: 1.5 mg/kg/h via INTRAVENOUS
  Filled 2022-08-03 (×2): qty 25

## 2022-08-03 MED ORDER — PLASMA-LYTE A IV SOLN
INTRAVENOUS | Status: DC
  Filled 2022-08-03 (×3): qty 2.5

## 2022-08-03 MED ORDER — INSULIN REGULAR(HUMAN) IN NACL 100-0.9 UT/100ML-% IV SOLN
INTRAVENOUS | Status: AC
  Administered 2022-08-04: 1.2 [IU]/h via INTRAVENOUS
  Filled 2022-08-03: qty 100

## 2022-08-03 MED ORDER — PHENYLEPHRINE HCL-NACL 20-0.9 MG/250ML-% IV SOLN
30.0000 ug/min | INTRAVENOUS | Status: AC
  Administered 2022-08-04: 25 ug/min via INTRAVENOUS
  Filled 2022-08-03: qty 250

## 2022-08-04 ENCOUNTER — Inpatient Hospital Stay (HOSPITAL_COMMUNITY)
Admission: RE | Disposition: A | Discharge status: Rehabilitation Facility | Payer: Self-pay | Source: Home / Self Care | Attending: Cardiothoracic Surgery

## 2022-08-04 ENCOUNTER — Inpatient Hospital Stay (HOSPITAL_COMMUNITY): Payer: Medicare Other | Admitting: Certified Registered"

## 2022-08-04 ENCOUNTER — Inpatient Hospital Stay (HOSPITAL_COMMUNITY): Payer: Medicare Other

## 2022-08-04 ENCOUNTER — Encounter (HOSPITAL_COMMUNITY): Payer: Self-pay | Admitting: Cardiothoracic Surgery

## 2022-08-04 ENCOUNTER — Other Ambulatory Visit: Payer: Self-pay

## 2022-08-04 ENCOUNTER — Inpatient Hospital Stay (HOSPITAL_COMMUNITY)
Admission: RE | Admit: 2022-08-04 | Discharge: 2022-08-10 | DRG: 236 | Disposition: A | Discharge status: Rehabilitation Facility | Payer: Medicare Other | Attending: Cardiothoracic Surgery | Admitting: Cardiothoracic Surgery

## 2022-08-04 DIAGNOSIS — J9600 Acute respiratory failure, unspecified whether with hypoxia or hypercapnia: Secondary | ICD-10-CM

## 2022-08-04 DIAGNOSIS — I10 Essential (primary) hypertension: Secondary | ICD-10-CM

## 2022-08-04 DIAGNOSIS — Z818 Family history of other mental and behavioral disorders: Secondary | ICD-10-CM | POA: Diagnosis not present

## 2022-08-04 DIAGNOSIS — Z87442 Personal history of urinary calculi: Secondary | ICD-10-CM | POA: Diagnosis not present

## 2022-08-04 DIAGNOSIS — G8929 Other chronic pain: Secondary | ICD-10-CM | POA: Diagnosis present

## 2022-08-04 DIAGNOSIS — R197 Diarrhea, unspecified: Secondary | ICD-10-CM | POA: Diagnosis not present

## 2022-08-04 DIAGNOSIS — F0280 Dementia in other diseases classified elsewhere without behavioral disturbance: Secondary | ICD-10-CM | POA: Diagnosis not present

## 2022-08-04 DIAGNOSIS — Z8546 Personal history of malignant neoplasm of prostate: Secondary | ICD-10-CM

## 2022-08-04 DIAGNOSIS — Z951 Presence of aortocoronary bypass graft: Principal | ICD-10-CM

## 2022-08-04 DIAGNOSIS — E871 Hypo-osmolality and hyponatremia: Secondary | ICD-10-CM | POA: Diagnosis not present

## 2022-08-04 DIAGNOSIS — J9811 Atelectasis: Secondary | ICD-10-CM | POA: Diagnosis not present

## 2022-08-04 DIAGNOSIS — E876 Hypokalemia: Secondary | ICD-10-CM | POA: Diagnosis not present

## 2022-08-04 DIAGNOSIS — Z7901 Long term (current) use of anticoagulants: Secondary | ICD-10-CM | POA: Diagnosis not present

## 2022-08-04 DIAGNOSIS — Z01818 Encounter for other preprocedural examination: Secondary | ICD-10-CM

## 2022-08-04 DIAGNOSIS — I2699 Other pulmonary embolism without acute cor pulmonale: Secondary | ICD-10-CM

## 2022-08-04 DIAGNOSIS — Z9079 Acquired absence of other genital organ(s): Secondary | ICD-10-CM | POA: Diagnosis not present

## 2022-08-04 DIAGNOSIS — K5900 Constipation, unspecified: Secondary | ICD-10-CM | POA: Diagnosis not present

## 2022-08-04 DIAGNOSIS — Z981 Arthrodesis status: Secondary | ICD-10-CM

## 2022-08-04 DIAGNOSIS — Z86711 Personal history of pulmonary embolism: Secondary | ICD-10-CM

## 2022-08-04 DIAGNOSIS — I25119 Atherosclerotic heart disease of native coronary artery with unspecified angina pectoris: Principal | ICD-10-CM | POA: Diagnosis present

## 2022-08-04 DIAGNOSIS — F419 Anxiety disorder, unspecified: Secondary | ICD-10-CM

## 2022-08-04 DIAGNOSIS — Z79899 Other long term (current) drug therapy: Secondary | ICD-10-CM

## 2022-08-04 DIAGNOSIS — Z8701 Personal history of pneumonia (recurrent): Secondary | ICD-10-CM

## 2022-08-04 DIAGNOSIS — R5381 Other malaise: Secondary | ICD-10-CM | POA: Diagnosis not present

## 2022-08-04 DIAGNOSIS — I2510 Atherosclerotic heart disease of native coronary artery without angina pectoris: Secondary | ICD-10-CM | POA: Diagnosis not present

## 2022-08-04 DIAGNOSIS — E7800 Pure hypercholesterolemia, unspecified: Secondary | ICD-10-CM | POA: Diagnosis present

## 2022-08-04 DIAGNOSIS — K219 Gastro-esophageal reflux disease without esophagitis: Secondary | ICD-10-CM | POA: Diagnosis present

## 2022-08-04 DIAGNOSIS — D6959 Other secondary thrombocytopenia: Secondary | ICD-10-CM | POA: Diagnosis not present

## 2022-08-04 DIAGNOSIS — I340 Nonrheumatic mitral (valve) insufficiency: Secondary | ICD-10-CM | POA: Diagnosis not present

## 2022-08-04 DIAGNOSIS — G20A1 Parkinson's disease without dyskinesia, without mention of fluctuations: Secondary | ICD-10-CM | POA: Diagnosis not present

## 2022-08-04 DIAGNOSIS — Z86010 Personal history of colonic polyps: Secondary | ICD-10-CM

## 2022-08-04 DIAGNOSIS — R9439 Abnormal result of other cardiovascular function study: Secondary | ICD-10-CM

## 2022-08-04 DIAGNOSIS — M549 Dorsalgia, unspecified: Secondary | ICD-10-CM | POA: Diagnosis not present

## 2022-08-04 DIAGNOSIS — D62 Acute posthemorrhagic anemia: Secondary | ICD-10-CM | POA: Diagnosis not present

## 2022-08-04 DIAGNOSIS — Z8249 Family history of ischemic heart disease and other diseases of the circulatory system: Secondary | ICD-10-CM | POA: Diagnosis not present

## 2022-08-04 DIAGNOSIS — R7303 Prediabetes: Secondary | ICD-10-CM | POA: Diagnosis not present

## 2022-08-04 DIAGNOSIS — Z7982 Long term (current) use of aspirin: Secondary | ICD-10-CM

## 2022-08-04 DIAGNOSIS — J90 Pleural effusion, not elsewhere classified: Secondary | ICD-10-CM | POA: Diagnosis not present

## 2022-08-04 DIAGNOSIS — M1990 Unspecified osteoarthritis, unspecified site: Secondary | ICD-10-CM | POA: Diagnosis present

## 2022-08-04 DIAGNOSIS — Z880 Allergy status to penicillin: Secondary | ICD-10-CM | POA: Diagnosis not present

## 2022-08-04 DIAGNOSIS — Z86718 Personal history of other venous thrombosis and embolism: Secondary | ICD-10-CM

## 2022-08-04 DIAGNOSIS — R0689 Other abnormalities of breathing: Secondary | ICD-10-CM | POA: Diagnosis not present

## 2022-08-04 DIAGNOSIS — R079 Chest pain, unspecified: Secondary | ICD-10-CM | POA: Diagnosis not present

## 2022-08-04 DIAGNOSIS — Z7902 Long term (current) use of antithrombotics/antiplatelets: Secondary | ICD-10-CM

## 2022-08-04 DIAGNOSIS — E8770 Fluid overload, unspecified: Secondary | ICD-10-CM | POA: Diagnosis not present

## 2022-08-04 DIAGNOSIS — Z888 Allergy status to other drugs, medicaments and biological substances status: Secondary | ICD-10-CM | POA: Diagnosis not present

## 2022-08-04 HISTORY — PX: CORONARY ARTERY BYPASS GRAFT: SHX141

## 2022-08-04 HISTORY — PX: CLIPPING OF ATRIAL APPENDAGE: SHX5773

## 2022-08-04 HISTORY — PX: TEE WITHOUT CARDIOVERSION: SHX5443

## 2022-08-04 LAB — POCT I-STAT, CHEM 8
BUN: 11 mg/dL (ref 8–23)
BUN: 10 mg/dL (ref 8–23)
BUN: 9 mg/dL (ref 8–23)
BUN: 10 mg/dL (ref 8–23)
BUN: 11 mg/dL (ref 8–23)
Calcium, Ion: 1.22 mmol/L (ref 1.15–1.40)
Calcium, Ion: 1.19 mmol/L (ref 1.15–1.40)
Calcium, Ion: 1.07 mmol/L — ABNORMAL LOW (ref 1.15–1.40)
Calcium, Ion: 1.02 mmol/L — ABNORMAL LOW (ref 1.15–1.40)
Calcium, Ion: 1.03 mmol/L — ABNORMAL LOW (ref 1.15–1.40)
Chloride: 102 mmol/L (ref 98–111)
Chloride: 104 mmol/L (ref 98–111)
Chloride: 103 mmol/L (ref 98–111)
Chloride: 101 mmol/L (ref 98–111)
Chloride: 101 mmol/L (ref 98–111)
Creatinine, Ser: 0.6 mg/dL — ABNORMAL LOW (ref 0.61–1.24)
Creatinine, Ser: 0.6 mg/dL — ABNORMAL LOW (ref 0.61–1.24)
Creatinine, Ser: 0.6 mg/dL — ABNORMAL LOW (ref 0.61–1.24)
Creatinine, Ser: 0.6 mg/dL — ABNORMAL LOW (ref 0.61–1.24)
Creatinine, Ser: 0.6 mg/dL — ABNORMAL LOW (ref 0.61–1.24)
Glucose, Bld: 129 mg/dL — ABNORMAL HIGH (ref 70–99)
Glucose, Bld: 99 mg/dL (ref 70–99)
Glucose, Bld: 109 mg/dL — ABNORMAL HIGH (ref 70–99)
Glucose, Bld: 127 mg/dL — ABNORMAL HIGH (ref 70–99)
Glucose, Bld: 131 mg/dL — ABNORMAL HIGH (ref 70–99)
HCT: 42 % (ref 39.0–52.0)
HCT: 37 % — ABNORMAL LOW (ref 39.0–52.0)
HCT: 31 % — ABNORMAL LOW (ref 39.0–52.0)
HCT: 29 % — ABNORMAL LOW (ref 39.0–52.0)
HCT: 30 % — ABNORMAL LOW (ref 39.0–52.0)
Hemoglobin: 14.3 g/dL (ref 13.0–17.0)
Hemoglobin: 12.6 g/dL — ABNORMAL LOW (ref 13.0–17.0)
Hemoglobin: 10.5 g/dL — ABNORMAL LOW (ref 13.0–17.0)
Hemoglobin: 10.2 g/dL — ABNORMAL LOW (ref 13.0–17.0)
Hemoglobin: 9.9 g/dL — ABNORMAL LOW (ref 13.0–17.0)
Potassium: 3.7 mmol/L (ref 3.5–5.1)
Potassium: 3.5 mmol/L (ref 3.5–5.1)
Potassium: 4.5 mmol/L (ref 3.5–5.1)
Potassium: 4 mmol/L (ref 3.5–5.1)
Potassium: 4.4 mmol/L (ref 3.5–5.1)
Sodium: 139 mmol/L (ref 135–145)
Sodium: 139 mmol/L (ref 135–145)
Sodium: 138 mmol/L (ref 135–145)
Sodium: 137 mmol/L (ref 135–145)
Sodium: 138 mmol/L (ref 135–145)
TCO2: 27 mmol/L (ref 22–32)
TCO2: 25 mmol/L (ref 22–32)
TCO2: 27 mmol/L (ref 22–32)
TCO2: 27 mmol/L (ref 22–32)
TCO2: 27 mmol/L (ref 22–32)

## 2022-08-04 LAB — POCT I-STAT EG7
Acid-base deficit: 1 mmol/L (ref 0.0–2.0)
Bicarbonate: 24.5 mmol/L (ref 20.0–28.0)
Calcium, Ion: 1.04 mmol/L — ABNORMAL LOW (ref 1.15–1.40)
HCT: 28 % — ABNORMAL LOW (ref 39.0–52.0)
Hemoglobin: 9.5 g/dL — ABNORMAL LOW (ref 13.0–17.0)
O2 Saturation: 82 %
Potassium: 6.1 mmol/L — ABNORMAL HIGH (ref 3.5–5.1)
Sodium: 138 mmol/L (ref 135–145)
TCO2: 26 mmol/L (ref 22–32)
pCO2, Ven: 43.8 mmHg — ABNORMAL LOW (ref 44–60)
pH, Ven: 7.355 (ref 7.25–7.43)
pO2, Ven: 49 mmHg — ABNORMAL HIGH (ref 32–45)

## 2022-08-04 LAB — POCT I-STAT 7, (LYTES, BLD GAS, ICA,H+H)
Acid-base deficit: 1 mmol/L (ref 0.0–2.0)
Acid-base deficit: 3 mmol/L — ABNORMAL HIGH (ref 0.0–2.0)
Acid-base deficit: 2 mmol/L (ref 0.0–2.0)
Bicarbonate: 24.2 mmol/L (ref 20.0–28.0)
Bicarbonate: 21 mmol/L (ref 20.0–28.0)
Bicarbonate: 22.2 mmol/L (ref 20.0–28.0)
Calcium, Ion: 1 mmol/L — ABNORMAL LOW (ref 1.15–1.40)
Calcium, Ion: 1.08 mmol/L — ABNORMAL LOW (ref 1.15–1.40)
Calcium, Ion: 1.11 mmol/L — ABNORMAL LOW (ref 1.15–1.40)
HCT: 29 % — ABNORMAL LOW (ref 39.0–52.0)
HCT: 30 % — ABNORMAL LOW (ref 39.0–52.0)
HCT: 30 % — ABNORMAL LOW (ref 39.0–52.0)
Hemoglobin: 9.9 g/dL — ABNORMAL LOW (ref 13.0–17.0)
Hemoglobin: 10.2 g/dL — ABNORMAL LOW (ref 13.0–17.0)
Hemoglobin: 10.2 g/dL — ABNORMAL LOW (ref 13.0–17.0)
O2 Saturation: 100 %
O2 Saturation: 97 %
O2 Saturation: 99 %
Patient temperature: 34.6
Patient temperature: 36.1
Potassium: 3.7 mmol/L (ref 3.5–5.1)
Potassium: 3.7 mmol/L (ref 3.5–5.1)
Potassium: 4.7 mmol/L (ref 3.5–5.1)
Sodium: 140 mmol/L (ref 135–145)
Sodium: 141 mmol/L (ref 135–145)
Sodium: 140 mmol/L (ref 135–145)
TCO2: 25 mmol/L (ref 22–32)
TCO2: 22 mmol/L (ref 22–32)
TCO2: 23 mmol/L (ref 22–32)
pCO2 arterial: 40.3 mmHg (ref 32–48)
pCO2 arterial: 30.8 mmHg — ABNORMAL LOW (ref 32–48)
pCO2 arterial: 33.8 mmHg (ref 32–48)
pH, Arterial: 7.386 (ref 7.35–7.45)
pH, Arterial: 7.43 (ref 7.35–7.45)
pH, Arterial: 7.423 (ref 7.35–7.45)
pO2, Arterial: 358 mmHg — ABNORMAL HIGH (ref 83–108)
pO2, Arterial: 74 mmHg — ABNORMAL LOW (ref 83–108)
pO2, Arterial: 132 mmHg — ABNORMAL HIGH (ref 83–108)

## 2022-08-04 LAB — BLOOD GAS, ARTERIAL
Acid-Base Excess: 2.8 mmol/L — ABNORMAL HIGH (ref 0.0–2.0)
Bicarbonate: 27.2 mmol/L (ref 20.0–28.0)
O2 Saturation: 99.3 %
Patient temperature: 37
pCO2 arterial: 40 mmHg (ref 32–48)
pH, Arterial: 7.44 (ref 7.35–7.45)
pO2, Arterial: 100 mmHg (ref 83–108)

## 2022-08-04 LAB — CBC
HCT: 30.4 % — ABNORMAL LOW (ref 39.0–52.0)
HCT: 31.3 % — ABNORMAL LOW (ref 39.0–52.0)
HCT: 32.2 % — ABNORMAL LOW (ref 39.0–52.0)
Hemoglobin: 10.4 g/dL — ABNORMAL LOW (ref 13.0–17.0)
Hemoglobin: 10.7 g/dL — ABNORMAL LOW (ref 13.0–17.0)
Hemoglobin: 11 g/dL — ABNORMAL LOW (ref 13.0–17.0)
MCH: 29.3 pg (ref 26.0–34.0)
MCH: 29.5 pg (ref 26.0–34.0)
MCH: 29.3 pg (ref 26.0–34.0)
MCHC: 34.2 g/dL (ref 30.0–36.0)
MCHC: 34.2 g/dL (ref 30.0–36.0)
MCHC: 34.2 g/dL (ref 30.0–36.0)
MCV: 85.6 fL (ref 80.0–100.0)
MCV: 86.2 fL (ref 80.0–100.0)
MCV: 85.9 fL (ref 80.0–100.0)
Platelets: 120 10*3/uL — ABNORMAL LOW (ref 150–400)
Platelets: 113 10*3/uL — ABNORMAL LOW (ref 150–400)
Platelets: 126 10*3/uL — ABNORMAL LOW (ref 150–400)
RBC: 3.55 MIL/uL — ABNORMAL LOW (ref 4.22–5.81)
RBC: 3.63 MIL/uL — ABNORMAL LOW (ref 4.22–5.81)
RBC: 3.75 MIL/uL — ABNORMAL LOW (ref 4.22–5.81)
RDW: 14.4 % (ref 11.5–15.5)
RDW: 14.4 % (ref 11.5–15.5)
RDW: 14.6 % (ref 11.5–15.5)
WBC: 12.1 10*3/uL — ABNORMAL HIGH (ref 4.0–10.5)
WBC: 10.3 10*3/uL (ref 4.0–10.5)
WBC: 8.1 10*3/uL (ref 4.0–10.5)
nRBC: 0 % (ref 0.0–0.2)
nRBC: 0 % (ref 0.0–0.2)
nRBC: 0 % (ref 0.0–0.2)

## 2022-08-04 LAB — GLUCOSE, CAPILLARY
Glucose-Capillary: 117 mg/dL — ABNORMAL HIGH (ref 70–99)
Glucose-Capillary: 122 mg/dL — ABNORMAL HIGH (ref 70–99)
Glucose-Capillary: 123 mg/dL — ABNORMAL HIGH (ref 70–99)
Glucose-Capillary: 130 mg/dL — ABNORMAL HIGH (ref 70–99)
Glucose-Capillary: 132 mg/dL — ABNORMAL HIGH (ref 70–99)
Glucose-Capillary: 138 mg/dL — ABNORMAL HIGH (ref 70–99)
Glucose-Capillary: 141 mg/dL — ABNORMAL HIGH (ref 70–99)
Glucose-Capillary: 147 mg/dL — ABNORMAL HIGH (ref 70–99)
Glucose-Capillary: 179 mg/dL — ABNORMAL HIGH (ref 70–99)

## 2022-08-04 LAB — PLATELET COUNT: Platelets: 142 K/uL — ABNORMAL LOW (ref 150–400)

## 2022-08-04 LAB — BASIC METABOLIC PANEL
Anion gap: 11 (ref 5–15)
BUN: 10 mg/dL (ref 8–23)
CO2: 18 mmol/L — ABNORMAL LOW (ref 22–32)
Calcium: 7.7 mg/dL — ABNORMAL LOW (ref 8.9–10.3)
Chloride: 108 mmol/L (ref 98–111)
Creatinine, Ser: 0.83 mg/dL (ref 0.61–1.24)
GFR, Estimated: >60 mL/min (ref >60)
Glucose, Bld: 143 mg/dL — ABNORMAL HIGH (ref 70–99)
Potassium: 4.4 mmol/L (ref 3.5–5.1)
Sodium: 137 mmol/L (ref 135–145)

## 2022-08-04 LAB — HEMOGLOBIN AND HEMATOCRIT, BLOOD
HCT: 30.2 % — ABNORMAL LOW (ref 39.0–52.0)
Hemoglobin: 10.7 g/dL — ABNORMAL LOW (ref 13.0–17.0)

## 2022-08-04 LAB — ECHO INTRAOPERATIVE TEE
Height: 68 in
Weight: 2607.99 oz

## 2022-08-04 LAB — COOXEMETRY PANEL
Carboxyhemoglobin: 1.4 % (ref 0.5–1.5)
Methemoglobin: <0.7 % (ref 0.0–1.5)
O2 Saturation: 66.6 %
Total hemoglobin: 10.4 g/dL — ABNORMAL LOW (ref 12.0–16.0)

## 2022-08-04 LAB — APTT
aPTT: 35 seconds (ref 24–36)
aPTT: 36 seconds (ref 24–36)

## 2022-08-04 LAB — PROTIME-INR
INR: 1.5 — ABNORMAL HIGH (ref 0.8–1.2)
INR: 1.5 — ABNORMAL HIGH (ref 0.8–1.2)
Prothrombin Time: 18.3 seconds — ABNORMAL HIGH (ref 11.4–15.2)
Prothrombin Time: 18.1 seconds — ABNORMAL HIGH (ref 11.4–15.2)

## 2022-08-04 LAB — MAGNESIUM: Magnesium: 3 mg/dL — ABNORMAL HIGH (ref 1.7–2.4)

## 2022-08-04 SURGERY — CORONARY ARTERY BYPASS GRAFTING (CABG)
Anesthesia: General | Site: Chest

## 2022-08-04 MED ORDER — SODIUM CHLORIDE (PF) 0.9 % IJ SOLN
INTRAMUSCULAR | Status: AC
  Filled 2022-08-04: qty 10

## 2022-08-04 MED ORDER — SODIUM CHLORIDE 0.9 % IV SOLN: INTRAVENOUS | Status: DC | PRN

## 2022-08-04 MED ORDER — ROCURONIUM BROMIDE 10 MG/ML (PF) SYRINGE
PREFILLED_SYRINGE | INTRAVENOUS | Status: AC
  Filled 2022-08-04: qty 10

## 2022-08-04 MED ORDER — MORPHINE SULFATE (PF) 4 MG/ML IV SOLN
INTRAVENOUS | Status: AC
  Administered 2022-08-04: 4 mg
  Filled 2022-08-04: qty 1

## 2022-08-04 MED ORDER — FAMOTIDINE 20 MG PO TABS: 20.0000 mg | ORAL_TABLET | Freq: Two times a day (BID) | ORAL | Status: DC

## 2022-08-04 MED ORDER — MAGNESIUM SULFATE 4 GM/100ML IV SOLN
4.0000 g | Freq: Once | INTRAVENOUS | Status: AC
  Administered 2022-08-04: 4 g via INTRAVENOUS
  Filled 2022-08-04: qty 100

## 2022-08-04 MED ORDER — NITROGLYCERIN IN D5W 200-5 MCG/ML-% IV SOLN: 0.0000 ug/min | INTRAVENOUS | Status: DC

## 2022-08-04 MED ORDER — SODIUM CHLORIDE 0.45 % IV SOLN: INTRAVENOUS | Status: DC | PRN

## 2022-08-04 MED ORDER — ASPIRIN 81 MG PO CHEW: 324.0000 mg | CHEWABLE_TABLET | Freq: Every day | ORAL | Status: DC

## 2022-08-04 MED ORDER — PLASMA-LYTE A IV SOLN: INTRAVENOUS | Status: DC | PRN

## 2022-08-04 MED ORDER — GABAPENTIN 300 MG PO CAPS
300.0000 mg | ORAL_CAPSULE | Freq: Every evening | ORAL | Status: DC | PRN
  Filled 2022-08-04: qty 1

## 2022-08-04 MED ORDER — TRAMADOL HCL 50 MG PO TABS
50.0000 mg | ORAL_TABLET | ORAL | Status: DC | PRN
  Administered 2022-08-04 – 2022-08-05 (×4): 100 mg via ORAL
  Filled 2022-08-04 (×4): qty 2

## 2022-08-04 MED ORDER — PROPOFOL 10 MG/ML IV BOLUS
INTRAVENOUS | Status: AC
  Filled 2022-08-04: qty 20

## 2022-08-04 MED ORDER — ORAL CARE MOUTH RINSE
15.0000 mL | OROMUCOSAL | Status: DC
  Administered 2022-08-04 (×4): 15 mL via OROMUCOSAL

## 2022-08-04 MED ORDER — DEXMEDETOMIDINE HCL IN NACL 400 MCG/100ML IV SOLN: 0.0000 ug/kg/h | INTRAVENOUS | Status: DC

## 2022-08-04 MED ORDER — ASPIRIN 325 MG PO TBEC
325.0000 mg | DELAYED_RELEASE_TABLET | Freq: Every day | ORAL | Status: DC
  Administered 2022-08-05 – 2022-08-07 (×3): 325 mg via ORAL
  Filled 2022-08-04 (×3): qty 1

## 2022-08-04 MED ORDER — PHENYLEPHRINE HCL-NACL 20-0.9 MG/250ML-% IV SOLN: 0.0000 ug/min | INTRAVENOUS | Status: DC

## 2022-08-04 MED ORDER — CHLORHEXIDINE GLUCONATE CLOTH 2 % EX PADS
6.0000 | MEDICATED_PAD | Freq: Every day | CUTANEOUS | Status: DC
  Administered 2022-08-04 – 2022-08-08 (×5): 6 via TOPICAL

## 2022-08-04 MED ORDER — MIDAZOLAM HCL 2 MG/2ML IJ SOLN: 2.0000 mg | INTRAMUSCULAR | Status: DC | PRN

## 2022-08-04 MED ORDER — ROSUVASTATIN CALCIUM 5 MG PO TABS
5.0000 mg | ORAL_TABLET | Freq: Every day | ORAL | Status: DC
  Administered 2022-08-05: 5 mg via ORAL
  Filled 2022-08-04: qty 1

## 2022-08-04 MED ORDER — METOPROLOL TARTRATE 25 MG/10 ML ORAL SUSPENSION: 12.5000 mg | Freq: Two times a day (BID) | ORAL | Status: DC

## 2022-08-04 MED ORDER — CHLORHEXIDINE GLUCONATE 0.12 % MT SOLN
15.0000 mL | OROMUCOSAL | Status: AC
  Administered 2022-08-04: 15 mL via OROMUCOSAL
  Filled 2022-08-04: qty 15

## 2022-08-04 MED ORDER — PHENYLEPHRINE 80 MCG/ML (10ML) SYRINGE FOR IV PUSH (FOR BLOOD PRESSURE SUPPORT)
PREFILLED_SYRINGE | INTRAVENOUS | Status: AC
  Filled 2022-08-04: qty 10

## 2022-08-04 MED ORDER — FAMOTIDINE IN NACL 20-0.9 MG/50ML-% IV SOLN
20.0000 mg | Freq: Two times a day (BID) | INTRAVENOUS | Status: AC
  Administered 2022-08-04 (×2): 20 mg via INTRAVENOUS
  Filled 2022-08-04 (×2): qty 50

## 2022-08-04 MED ORDER — ARTIFICIAL TEARS OPHTHALMIC OINT
TOPICAL_OINTMENT | OPHTHALMIC | Status: DC | PRN
  Administered 2022-08-04: 1 via OPHTHALMIC

## 2022-08-04 MED ORDER — ROCURONIUM BROMIDE 10 MG/ML (PF) SYRINGE
PREFILLED_SYRINGE | INTRAVENOUS | Status: DC | PRN
  Administered 2022-08-04 (×2): 30 mg via INTRAVENOUS
  Administered 2022-08-04: 50 mg via INTRAVENOUS
  Administered 2022-08-04: 30 mg via INTRAVENOUS
  Administered 2022-08-04: 100 mg via INTRAVENOUS

## 2022-08-04 MED ORDER — LACTATED RINGERS IV SOLN: INTRAVENOUS | Status: DC | PRN

## 2022-08-04 MED ORDER — SODIUM CHLORIDE 0.9% FLUSH
3.0000 mL | Freq: Two times a day (BID) | INTRAVENOUS | Status: DC
  Administered 2022-08-05 – 2022-08-10 (×10): 3 mL via INTRAVENOUS

## 2022-08-04 MED ORDER — METOPROLOL TARTRATE 12.5 MG HALF TABLET: 12.5000 mg | ORAL_TABLET | Freq: Two times a day (BID) | ORAL | Status: DC

## 2022-08-04 MED ORDER — HEPARIN SODIUM (PORCINE) 1000 UNIT/ML IJ SOLN
INTRAMUSCULAR | Status: DC | PRN
  Administered 2022-08-04: 5000 [IU] via INTRAVENOUS
  Administered 2022-08-04: 19000 [IU] via INTRAVENOUS

## 2022-08-04 MED ORDER — POLYVINYL ALCOHOL 1.4 % OP SOLN: 1.0000 [drp] | OPHTHALMIC | Status: DC | PRN

## 2022-08-04 MED ORDER — METOPROLOL TARTRATE 5 MG/5ML IV SOLN: 2.5000 mg | INTRAVENOUS | Status: DC | PRN

## 2022-08-04 MED ORDER — ALBUMIN HUMAN 5 % IV SOLN: INTRAVENOUS | Status: DC | PRN

## 2022-08-04 MED ORDER — INSULIN REGULAR(HUMAN) IN NACL 100-0.9 UT/100ML-% IV SOLN: INTRAVENOUS | Status: DC

## 2022-08-04 MED ORDER — VANCOMYCIN HCL IN DEXTROSE 1-5 GM/200ML-% IV SOLN
1000.0000 mg | Freq: Once | INTRAVENOUS | Status: AC
  Administered 2022-08-04: 1000 mg via INTRAVENOUS
  Filled 2022-08-04: qty 200

## 2022-08-04 MED ORDER — VANCOMYCIN HCL 1000 MG IV SOLR: INTRAVENOUS | Status: DC | PRN

## 2022-08-04 MED ORDER — POLYVINYL ALCOHOL-POVIDONE PF 1.4-0.6 % OP SOLN: 1.0000 [drp] | Freq: Every day | OPHTHALMIC | Status: DC | PRN

## 2022-08-04 MED ORDER — FENTANYL CITRATE (PF) 250 MCG/5ML IJ SOLN
INTRAMUSCULAR | Status: AC
  Filled 2022-08-04: qty 5

## 2022-08-04 MED ORDER — BISACODYL 5 MG PO TBEC
10.0000 mg | DELAYED_RELEASE_TABLET | Freq: Every day | ORAL | Status: DC
  Administered 2022-08-05 – 2022-08-06 (×2): 10 mg via ORAL
  Filled 2022-08-04 (×2): qty 2

## 2022-08-04 MED ORDER — MORPHINE SULFATE (PF) 2 MG/ML IV SOLN
1.0000 mg | INTRAVENOUS | Status: DC | PRN
  Administered 2022-08-04 – 2022-08-06 (×6): 2 mg via INTRAVENOUS
  Filled 2022-08-04: qty 2
  Filled 2022-08-04 (×5): qty 1

## 2022-08-04 MED ORDER — PHENYLEPHRINE 80 MCG/ML (10ML) SYRINGE FOR IV PUSH (FOR BLOOD PRESSURE SUPPORT)
PREFILLED_SYRINGE | INTRAVENOUS | Status: DC | PRN
  Administered 2022-08-04: 80 ug via INTRAVENOUS
  Administered 2022-08-04: 160 ug via INTRAVENOUS
  Administered 2022-08-04 (×2): 80 ug via INTRAVENOUS

## 2022-08-04 MED ORDER — ORAL CARE MOUTH RINSE: 15.0000 mL | OROMUCOSAL | Status: DC | PRN

## 2022-08-04 MED ORDER — HEPARIN SODIUM (PORCINE) 1000 UNIT/ML IJ SOLN
INTRAMUSCULAR | Status: AC
  Filled 2022-08-04: qty 1

## 2022-08-04 MED ORDER — CHLORHEXIDINE GLUCONATE 0.12 % MT SOLN: 15.0000 mL | Freq: Once | OROMUCOSAL | Status: AC

## 2022-08-04 MED ORDER — BUPIVACAINE HCL (PF) 0.5 % IJ SOLN
INTRAMUSCULAR | Status: AC
  Filled 2022-08-04: qty 30

## 2022-08-04 MED ORDER — 0.9 % SODIUM CHLORIDE (POUR BTL) OPTIME
TOPICAL | Status: DC | PRN
  Administered 2022-08-04: 5000 mL

## 2022-08-04 MED ORDER — ACETAMINOPHEN 500 MG PO TABS
1000.0000 mg | ORAL_TABLET | Freq: Four times a day (QID) | ORAL | Status: AC
  Administered 2022-08-05 – 2022-08-09 (×18): 1000 mg via ORAL
  Filled 2022-08-04 (×19): qty 2

## 2022-08-04 MED ORDER — PANTOPRAZOLE SODIUM 40 MG PO TBEC
40.0000 mg | DELAYED_RELEASE_TABLET | Freq: Every day | ORAL | Status: DC
  Administered 2022-08-05 – 2022-08-10 (×6): 40 mg via ORAL
  Filled 2022-08-04 (×6): qty 1

## 2022-08-04 MED ORDER — ACETAMINOPHEN 160 MG/5ML PO SOLN
1000.0000 mg | Freq: Four times a day (QID) | ORAL | Status: AC
  Administered 2022-08-04: 1000 mg
  Filled 2022-08-04 (×2): qty 40.6

## 2022-08-04 MED ORDER — CARBIDOPA-LEVODOPA 25-100 MG PO TABS
1.0000 | ORAL_TABLET | Freq: Three times a day (TID) | ORAL | Status: DC
  Administered 2022-08-04 – 2022-08-10 (×19): 1 via ORAL
  Filled 2022-08-04 (×21): qty 1

## 2022-08-04 MED ORDER — LIDOCAINE 2% (20 MG/ML) 5 ML SYRINGE
INTRAMUSCULAR | Status: AC
  Filled 2022-08-04: qty 5

## 2022-08-04 MED ORDER — HEMOSTATIC AGENTS (NO CHARGE) OPTIME
TOPICAL | Status: DC | PRN
  Administered 2022-08-04 (×3): 1 via TOPICAL

## 2022-08-04 MED ORDER — PANTOPRAZOLE SODIUM 40 MG PO TBEC: 40.0000 mg | DELAYED_RELEASE_TABLET | Freq: Every day | ORAL | Status: DC

## 2022-08-04 MED ORDER — DEXTROSE 50 % IV SOLN: 0.0000 mL | INTRAVENOUS | Status: DC | PRN

## 2022-08-04 MED ORDER — METOCLOPRAMIDE HCL 5 MG/ML IJ SOLN
10.0000 mg | Freq: Four times a day (QID) | INTRAMUSCULAR | Status: AC
  Administered 2022-08-04 – 2022-08-05 (×3): 10 mg via INTRAVENOUS
  Filled 2022-08-04 (×3): qty 2

## 2022-08-04 MED ORDER — LACTATED RINGERS IV SOLN: INTRAVENOUS | Status: DC

## 2022-08-04 MED ORDER — CHLORHEXIDINE GLUCONATE 0.12 % MT SOLN
OROMUCOSAL | Status: AC
  Administered 2022-08-04: 15 mL via OROMUCOSAL
  Filled 2022-08-04: qty 15

## 2022-08-04 MED ORDER — POTASSIUM CHLORIDE 10 MEQ/50ML IV SOLN
10.0000 meq | INTRAVENOUS | Status: AC
  Administered 2022-08-04 (×3): 10 meq via INTRAVENOUS

## 2022-08-04 MED ORDER — SODIUM CHLORIDE 0.9 % IV SOLN: 250.0000 mL | INTRAVENOUS | Status: DC

## 2022-08-04 MED ORDER — BISACODYL 10 MG RE SUPP: 10.0000 mg | Freq: Every day | RECTAL | Status: DC

## 2022-08-04 MED ORDER — LORATADINE 10 MG PO TABS
10.0000 mg | ORAL_TABLET | Freq: Every day | ORAL | Status: DC
  Administered 2022-08-05 – 2022-08-10 (×6): 10 mg via ORAL
  Filled 2022-08-04 (×7): qty 1

## 2022-08-04 MED ORDER — LEVOFLOXACIN IN D5W 750 MG/150ML IV SOLN
750.0000 mg | INTRAVENOUS | Status: AC
  Administered 2022-08-05: 750 mg via INTRAVENOUS
  Filled 2022-08-04: qty 150

## 2022-08-04 MED ORDER — BUPIVACAINE LIPOSOME 1.3 % IJ SUSP: INTRAMUSCULAR | Status: DC | PRN

## 2022-08-04 MED ORDER — DOBUTAMINE IN D5W 4-5 MG/ML-% IV SOLN: 2.5000 ug/kg/min | INTRAVENOUS | Status: DC

## 2022-08-04 MED ORDER — MIDAZOLAM HCL (PF) 10 MG/2ML IJ SOLN
INTRAMUSCULAR | Status: AC
  Filled 2022-08-04: qty 2

## 2022-08-04 MED ORDER — SODIUM CHLORIDE 0.9% FLUSH: 3.0000 mL | INTRAVENOUS | Status: DC | PRN

## 2022-08-04 MED ORDER — CHLORHEXIDINE GLUCONATE 4 % EX LIQD: 30.0000 mL | CUTANEOUS | Status: DC

## 2022-08-04 MED ORDER — MIDAZOLAM HCL (PF) 5 MG/ML IJ SOLN
INTRAMUSCULAR | Status: DC | PRN
  Administered 2022-08-04: 3 mg via INTRAVENOUS
  Administered 2022-08-04 (×3): 2 mg via INTRAVENOUS
  Administered 2022-08-04: 1 mg via INTRAVENOUS

## 2022-08-04 MED ORDER — ALBUMIN HUMAN 5 % IV SOLN
250.0000 mL | INTRAVENOUS | Status: AC | PRN
  Administered 2022-08-04 – 2022-08-05 (×4): 12.5 g via INTRAVENOUS
  Filled 2022-08-04 (×2): qty 250

## 2022-08-04 MED ORDER — DOCUSATE SODIUM 100 MG PO CAPS
200.0000 mg | ORAL_CAPSULE | Freq: Every day | ORAL | Status: DC
  Administered 2022-08-05 – 2022-08-06 (×2): 200 mg via ORAL
  Filled 2022-08-04 (×2): qty 2

## 2022-08-04 MED ORDER — PROTAMINE SULFATE 10 MG/ML IV SOLN
INTRAVENOUS | Status: DC | PRN
  Administered 2022-08-04: 210 mg via INTRAVENOUS

## 2022-08-04 MED ORDER — OXYCODONE HCL 5 MG PO TABS
5.0000 mg | ORAL_TABLET | ORAL | Status: DC | PRN
  Administered 2022-08-04 – 2022-08-06 (×2): 10 mg via ORAL
  Administered 2022-08-07: 5 mg via ORAL
  Filled 2022-08-04 (×2): qty 2
  Filled 2022-08-04: qty 1

## 2022-08-04 MED ORDER — SODIUM CHLORIDE 0.9 % IV SOLN: INTRAVENOUS | Status: DC

## 2022-08-04 MED ORDER — OMEPRAZOLE MAGNESIUM 20 MG PO TBEC: 20.0000 mg | DELAYED_RELEASE_TABLET | Freq: Every morning | ORAL | Status: DC

## 2022-08-04 MED ORDER — LACTATED RINGERS IV SOLN: 500.0000 mL | Freq: Once | INTRAVENOUS | Status: DC | PRN

## 2022-08-04 MED ORDER — FENTANYL CITRATE (PF) 250 MCG/5ML IJ SOLN
INTRAMUSCULAR | Status: DC | PRN
  Administered 2022-08-04 (×4): 50 ug via INTRAVENOUS
  Administered 2022-08-04: 250 ug via INTRAVENOUS
  Administered 2022-08-04: 200 ug via INTRAVENOUS
  Administered 2022-08-04 (×2): 250 ug via INTRAVENOUS
  Administered 2022-08-04 (×2): 50 ug via INTRAVENOUS

## 2022-08-04 MED ORDER — ~~LOC~~ CARDIAC SURGERY, PATIENT & FAMILY EDUCATION
Freq: Once | Status: DC
  Filled 2022-08-04: qty 1

## 2022-08-04 MED ORDER — ONDANSETRON HCL 4 MG/2ML IJ SOLN: 4.0000 mg | Freq: Four times a day (QID) | INTRAMUSCULAR | Status: DC | PRN

## 2022-08-04 MED ORDER — DONEPEZIL HCL 5 MG PO TABS
10.0000 mg | ORAL_TABLET | Freq: Every day | ORAL | Status: DC
  Administered 2022-08-04 – 2022-08-09 (×6): 10 mg via ORAL
  Filled 2022-08-04: qty 2
  Filled 2022-08-04 (×3): qty 1
  Filled 2022-08-04: qty 2
  Filled 2022-08-04: qty 1

## 2022-08-04 MED ORDER — PROPOFOL 10 MG/ML IV BOLUS
INTRAVENOUS | Status: DC | PRN
  Administered 2022-08-04 (×2): 50 mg via INTRAVENOUS
  Administered 2022-08-04: 100 mg via INTRAVENOUS

## 2022-08-04 MED ORDER — METOPROLOL TARTRATE 12.5 MG HALF TABLET
12.5000 mg | ORAL_TABLET | Freq: Once | ORAL | Status: AC
  Administered 2022-08-04: 12.5 mg via ORAL
  Filled 2022-08-04: qty 1

## 2022-08-04 MED ORDER — BUPIVACAINE LIPOSOME 1.3 % IJ SUSP
INTRAMUSCULAR | Status: AC
  Filled 2022-08-04: qty 20

## 2022-08-04 MED ORDER — EPHEDRINE 5 MG/ML INJ
INTRAVENOUS | Status: AC
  Filled 2022-08-04: qty 5

## 2022-08-04 MED ORDER — ARTIFICIAL TEARS OPHTHALMIC OINT
TOPICAL_OINTMENT | OPHTHALMIC | Status: AC
  Filled 2022-08-04: qty 3.5

## 2022-08-04 SURGICAL SUPPLY — 104 items
ADAPTER MULTI PERFUSION 15 (ADAPTER) ×3 IMPLANT
ADH SKN CLS APL DERMABOND .7 (GAUZE/BANDAGES/DRESSINGS) ×9
ANTIFOG SOL W/FOAM PAD STRL (MISCELLANEOUS) ×3
APPLIER CLIP 11 MED OPEN (CLIP) ×3
APPLIER CLIP 9.375 SM OPEN (CLIP) ×12
APR CLP MED 11 20 MLT OPN (CLIP) ×3
APR CLP SM 9.3 20 MLT OPN (CLIP) ×12
ATRICLIP EXCLUSION VLAA 45 (Miscellaneous) ×3 IMPLANT
BAG DECANTER FOR FLEXI CONT (MISCELLANEOUS) ×3 IMPLANT
BLADE CLIPPER SURG (BLADE) IMPLANT
BLADE MINI 60D BLUE (BLADE) ×3 IMPLANT
BLADE STERNUM SYSTEM 6 (BLADE) ×3 IMPLANT
BLADE SURG 11 STRL SS (BLADE) IMPLANT
BLOWER MISTER CAL-MED (MISCELLANEOUS) ×3 IMPLANT
BNDG ELASTIC 4X5.8 VLCR STR LF (GAUZE/BANDAGES/DRESSINGS) ×3 IMPLANT
BNDG ELASTIC 6X5.8 VLCR STR LF (GAUZE/BANDAGES/DRESSINGS) ×3 IMPLANT
BNDG GAUZE DERMACEA FLUFF 4 (GAUZE/BANDAGES/DRESSINGS) ×3 IMPLANT
BNDG GZE DERMACEA 4 6PLY (GAUZE/BANDAGES/DRESSINGS) ×3
BULB SUCTION JP 400 (SUCTIONS) ×3 IMPLANT
CANISTER SUCT 3000ML PPV (MISCELLANEOUS) ×3 IMPLANT
CANNULA AORTIC ROOT 9FR (CANNULA) ×3 IMPLANT
CANNULA MC2 2 STG 29/37 NON-V (CANNULA) ×3 IMPLANT
CANNULA MC2 TWO STAGE (CANNULA) ×3
CANNULA VESSEL 3MM 2 BLNT TIP (CANNULA) ×3 IMPLANT
CATH ROBINSON RED A/P 18FR (CATHETERS) ×9 IMPLANT
CLIP APPLIE 11 MED OPEN (CLIP) ×3 IMPLANT
CLIP APPLIE 9.375 SM OPEN (CLIP) ×6 IMPLANT
CLIP TI MEDIUM 24 (CLIP) IMPLANT
CLIP TI WIDE RED SMALL 24 (CLIP) IMPLANT
CONNECTOR BLAKE 2:1 CARIO BLK (MISCELLANEOUS) IMPLANT
DERMABOND ADVANCED .7 DNX12 (GAUZE/BANDAGES/DRESSINGS) ×6 IMPLANT
DEVICE EXCLUSIN ATRCLP VLAA 45 (Miscellaneous) IMPLANT
DRAIN CHANNEL 24FR (DRAIN) ×6 IMPLANT
DRAIN JACKSON PRATT 10MM FLAT (MISCELLANEOUS) ×3 IMPLANT
DRAPE CARDIOVASCULAR INCISE (DRAPES) ×3
DRAPE SRG 135X102X78XABS (DRAPES) ×3 IMPLANT
DRSG AQUACEL AG ADV 3.5X14 (GAUZE/BANDAGES/DRESSINGS) ×3 IMPLANT
ELECT REM PT RETURN 9FT ADLT (ELECTROSURGICAL) ×6
ELECTRODE REM PT RTRN 9FT ADLT (ELECTROSURGICAL) ×6 IMPLANT
FELT TEFLON 1X6 (MISCELLANEOUS) ×3 IMPLANT
GAUZE 4X4 16PLY ~~LOC~~+RFID DBL (SPONGE) IMPLANT
GAUZE SPONGE 4X4 12PLY STRL (GAUZE/BANDAGES/DRESSINGS) ×6 IMPLANT
GLOVE BIO SURGEON STRL SZ 6.5 (GLOVE) IMPLANT
GLOVE BIOGEL M 8.0 STRL (GLOVE) ×9 IMPLANT
GLOVE BIOGEL PI IND STRL 7.0 (GLOVE) IMPLANT
GOWN STRL REUS W/ TWL LRG LVL3 (GOWN DISPOSABLE) ×9 IMPLANT
GOWN STRL REUS W/ TWL XL LVL3 (GOWN DISPOSABLE) ×9 IMPLANT
GOWN STRL REUS W/TWL LRG LVL3 (GOWN DISPOSABLE) ×9
GOWN STRL REUS W/TWL XL LVL3 (GOWN DISPOSABLE) ×15
HEMOSTAT SURGICEL 2X14 (HEMOSTASIS) IMPLANT
HEMOSTAT SURGICEL NU-KNIT 6X9 (HEMOSTASIS) ×3 IMPLANT
INSERT FOGARTY XLG (MISCELLANEOUS) ×3 IMPLANT
KIT BASIN OR (CUSTOM PROCEDURE TRAY) ×3 IMPLANT
KIT TURNOVER KIT B (KITS) ×3 IMPLANT
KIT VASOVIEW HEMOPRO 2 VH 4000 (KITS) ×3 IMPLANT
KNIFE MICRO-UNI 3.5 30 DEG (BLADE) ×3 IMPLANT
LEAD PACING MYOCARDI (MISCELLANEOUS) ×3 IMPLANT
NS IRRIG 1000ML POUR BTL (IV SOLUTION) ×15 IMPLANT
OFFPUMP STABILIZER SUV (MISCELLANEOUS) ×3 IMPLANT
PACK E OPEN HEART (SUTURE) ×3 IMPLANT
PACK OPEN HEART (CUSTOM PROCEDURE TRAY) ×3 IMPLANT
PAD ARMBOARD 7.5X6 YLW CONV (MISCELLANEOUS) ×6 IMPLANT
PAD ELECT DEFIB RADIOL ZOLL (MISCELLANEOUS) ×3 IMPLANT
PENCIL BUTTON HOLSTER BLD 10FT (ELECTRODE) IMPLANT
POSITIONER HEAD DONUT 9IN (MISCELLANEOUS) ×3 IMPLANT
POWDER SURGICEL 3.0 GRAM (HEMOSTASIS) IMPLANT
PROBE INTRAVASCULAR 45 (VASCULAR PRODUCTS) ×3 IMPLANT
PUNCH AORTIC ROTATE 4.0MM (MISCELLANEOUS) IMPLANT
SET MPS 3-ND DEL (MISCELLANEOUS) IMPLANT
SOLUTION ANTFG W/FOAM PAD STRL (MISCELLANEOUS) IMPLANT
SPONGE T-LAP 18X18 ~~LOC~~+RFID (SPONGE) IMPLANT
SUT BONE WAX W31G (SUTURE) ×3 IMPLANT
SUT MNCRL AB 3-0 PS2 27 (SUTURE) ×6 IMPLANT
SUT MNCRL AB 4-0 PS2 18 (SUTURE) IMPLANT
SUT PROLENE 4 0 RB 1 (SUTURE) ×12
SUT PROLENE 4 0 SH DA (SUTURE) ×6 IMPLANT
SUT PROLENE 4-0 RB1 .5 CRCL 36 (SUTURE) ×6 IMPLANT
SUT PROLENE 5 0 C 1 36 (SUTURE) IMPLANT
SUT PROLENE 6 0 C 1 30 (SUTURE) IMPLANT
SUT PROLENE 7 0 BV1 MDA (SUTURE) IMPLANT
SUT SILK  1 MH (SUTURE) ×18
SUT SILK 1 MH (SUTURE) ×18 IMPLANT
SUT SILK 2 0 SH (SUTURE) IMPLANT
SUT STEEL 6MS V (SUTURE) IMPLANT
SUT STEEL STERNAL CCS#1 18IN (SUTURE) ×6 IMPLANT
SUT STEEL SZ 6 DBL 3X14 BALL (SUTURE) IMPLANT
SUT VIC AB 1 CTX 36 (SUTURE) ×9
SUT VIC AB 1 CTX36XBRD ANBCTR (SUTURE) IMPLANT
SUT VIC AB 2-0 CT1 27 (SUTURE) ×3
SUT VIC AB 2-0 CT1 TAPERPNT 27 (SUTURE) IMPLANT
SUT VIC AB 2-0 CTX 27 (SUTURE) ×6 IMPLANT
SYR 20CC LL (SYRINGE) IMPLANT
SYSTEM SAHARA CHEST DRAIN ATS (WOUND CARE) ×6 IMPLANT
TAPE CLOTH SURG 4X10 WHT LF (GAUZE/BANDAGES/DRESSINGS) IMPLANT
TAPE PAPER 2X10 WHT MICROPORE (GAUZE/BANDAGES/DRESSINGS) IMPLANT
TAPES RETRACTO (MISCELLANEOUS) IMPLANT
TOWEL GREEN STERILE (TOWEL DISPOSABLE) ×3 IMPLANT
TOWEL GREEN STERILE FF (TOWEL DISPOSABLE) ×3 IMPLANT
TRAY FOLEY SLVR 16FR TEMP STAT (SET/KITS/TRAYS/PACK) ×3 IMPLANT
TUBE CONNECTING 20X1/4 (TUBING) IMPLANT
TUBE SUCT INTRACARD DLP 20F (MISCELLANEOUS) ×3 IMPLANT
TUBING LAP HI FLOW INSUFFLATIO (TUBING) ×3 IMPLANT
UNDERPAD 30X36 HEAVY ABSORB (UNDERPADS AND DIAPERS) ×3 IMPLANT
WATER STERILE IRR 1000ML POUR (IV SOLUTION) ×6 IMPLANT

## 2022-08-04 NOTE — Progress Notes (Signed)
Rapid wean initiated per protocol  

## 2022-08-04 NOTE — Anesthesia Procedure Notes (Signed)
Arterial Line Insertion Start/End3/09/2022 7:00 AM, 08/04/2022 7:05 AM Performed by: Anastasio Auerbach, CRNA  Patient location: Pre-op. Preanesthetic checklist: patient identified, IV checked, site marked, risks and benefits discussed, surgical consent, monitors and equipment checked, pre-op evaluation, timeout performed and anesthesia consent Lidocaine 1% used for infiltration and patient sedated Left, radial was placed Catheter size: 20 G Hand hygiene performed , maximum sterile barriers used  and Seldinger technique used Allen's test indicative of satisfactory collateral circulation Attempts: 1 Procedure performed without using ultrasound guided technique. Following insertion, Biopatch and dressing applied. Post procedure assessment: normal  Patient tolerated the procedure well with no immediate complications.

## 2022-08-04 NOTE — Anesthesia Postprocedure Evaluation (Signed)
Anesthesia Post Note  Patient: Terry [name]  Procedure(s) Performed: CORONARY ARTERY BYPASS GRAFTING (CABG) X THREE USING LEFT INTERNAL MAMMARY ARTERY (LIMA) AND ENDOSCOPICALLY HARVESTED RIGHT GREATER SAPHENOUS VEIN. (Chest) TRANSESOPHAGEAL ECHOCARDIOGRAM CLIPPING OF ATRIAL APPENDAGE USING AN ATRICURE 45 ATRICLIP (Left: Chest)     Patient location during evaluation: SICU Anesthesia Type: General Level of consciousness: sedated Pain management: pain level controlled Vital Signs Assessment: post-procedure vital signs reviewed and stable Respiratory status: patient remains intubated per anesthesia plan Cardiovascular status: stable Postop Assessment: no apparent nausea or vomiting Anesthetic complications: no   No notable events documented.  Last Vitals:  Vitals:   08/04/22 1545 08/04/22 1600  BP:    Pulse: 71 70  Resp: 19 14  Temp: (!) 35.6 C (!) 35.7 C  SpO2: 100% 100%    Last Pain:  Vitals:   08/04/22 1600  TempSrc: Core  PainSc:                  Karyl Kinnier Crystina Borrayo

## 2022-08-04 NOTE — Anesthesia Procedure Notes (Signed)
Procedure Name: Intubation Date/Time: 08/04/2022 7:53 AM  Performed by: Anastasio Auerbach, CRNAPre-anesthesia Checklist: Patient identified, Emergency Drugs available, Suction available and Patient being monitored Patient Re-evaluated:Patient Re-evaluated prior to induction Oxygen Delivery Method: Circle system utilized Preoxygenation: Pre-oxygenation with 100% oxygen Induction Type: IV induction Ventilation: Mask ventilation without difficulty Laryngoscope Size: Mac and 3 Grade View: Grade II Tube type: Oral Tube size: 8.0 mm Number of attempts: 1 Airway Equipment and Method: Stylet and Oral airway Placement Confirmation: ETT inserted through vocal cords under direct vision, positive ETCO2 and breath sounds checked- equal and bilateral Secured at: 22 cm Tube secured with: Tape Dental Injury: Teeth and Oropharynx as per pre-operative assessment

## 2022-08-04 NOTE — Brief Op Note (Signed)
08/04/2022  7:44 AM  PATIENT:  Terry [name]  72 y.o. male  PRE-OPERATIVE DIAGNOSIS:  Coronary Artery Disease  POST-OPERATIVE DIAGNOSIS:  Coronary Artery Disease  PROCEDURE:  Procedure(s):  CORONARY ARTERY BYPASS GRAFTING x 3 -LIMA to LAD -SVG to DIAG -SVG to PDA  TRANSESOPHAGEAL ECHOCARDIOGRAM (N/A)  CLIPPING OF ATRIAL APPENDAGE USING 45 ATRICLIP (Left)  RIGHT GREATER SAPHENOUS LEG VEIN HARVESTED ENDOSCOPICALLY. (N/A Vein harvest time: 60 min Vein prep time: 10 min  SURGEON:  Surgeon(s) and Role:    * Enter, Pierre Bali, MD - Primary  PHYSICIAN ASSISTANT: Ellwood Handler PA-C, Wynelle Beckmann PA-c  ASSISTANTS: Despina Arias RNFA   ANESTHESIA:   general  EBL:  700 mL   BLOOD ADMINISTERED:   CC CELLSAVER  DRAINS:  Left Pleural Chest Tube, Mediastinal Chest Drain    LOCAL MEDICATIONS USED:  BUPIVICAINE   SPECIMEN:  No Specimen  DISPOSITION OF SPECIMEN:  N/A  COUNTS:  YES  TOURNIQUET:  * No tourniquets in log *  DICTATION: .Dragon Dictation  PLAN OF CARE: Admit to inpatient   PATIENT DISPOSITION:  ICU - intubated and hemodynamically stable.   Delay start of Pharmacological VTE agent (>24hrs) due to surgical blood loss or risk of bleeding: yes

## 2022-08-04 NOTE — Consult Note (Addendum)
NAME:  Terry [name], MRN:  [id number], DOB:  April 28, 1951, LOS: 0 ADMISSION DATE:  08/04/2022, CONSULTATION DATE:  3/5 REFERRING MD:  Dr. Tenny Craw, CHIEF COMPLAINT:  CABG   History of Present Illness:  Patient is a 72 yo M w/ pertinent PMH CAD, HLD, previous PE 2021 on xarelto, chronic back pain presents to [facility] ED on 3/5 for CABG.  Patient has been complaining of exertional chest tightness and was referred to cardiology on 07/15/2022. Patient received CTA showing coronary calcium score of 440 w/ significant disease in his RCA. Received heart cath on 07/20/2022 showing 95% proximal LAD stenosis and 90% proximal RCA stenosis. Patient scheduled for CABG in 2 weeks. Will hold xarelto 2 days prior to procedure.  On 3/5 patient presents for elective CABG procedure. Post op intubated. PCCM consulted for medical management.  Pertinent  Medical History   Past Medical History:  Diagnosis Date   Arthritis    neck, back, knees, right ankle   Bladder calculus    Chronic back pain    Coronary artery disease    GERD (gastroesophageal reflux disease)    History of colon polyps    benign   History of kidney stones    and bladder stone   History of prostate cancer urologist-- dr Alinda Money   dx 2014---  s/p  prostatectomy 05-22-2013 ,  Gleason 3+3   Hypercholesterolemia    takes Crestor daily   Hyperlipidemia    Pneumonia 10/2019   Pulmonary emboli ([place]) 10/2019   Wears glasses      Significant Hospital Events: Including procedures, antibiotic start and stop dates in addition to other pertinent events   3/5 CABG x3  Interim History / Subjective:  See above  Objective   Blood pressure (!) 125/90, pulse (!) 55, temperature 98.2 F (36.8 C), temperature source Oral, resp. rate 17, height '5\' 8"'$  (1.727 m), weight 73.9 kg, SpO2 98 %. PAP: (12-23)/(-6-8) 12/-1      Intake/Output Summary (Last 24 hours) at 08/04/2022 1246 Last data filed at 08/04/2022 1217 Gross per 24 hour  Intake 1190 ml  Output 800  ml  Net 390 ml   Filed Weights   08/04/22 0612  Weight: 73.9 kg    Examination: General:  critically ill appearing on mech vent HEENT: MM pink/moist; ETT in place Neuro: sedated CV: s1s2, paced rhythm, no m/r/g PULM:  dim clear BS bilaterally; on mech vent SIMV; CT in place GI: soft, bsx4 active  Extremities: warm/dry, no edema  Skin: no rashes or lesions    Resolved Hospital Problem list     Assessment & Plan:   Post op vent management Plan: - Continue full vent support (4-8cc/kg IBW) - Wean FiO2 for O2 sat > 90% - Daily WUA/SBT, rapid wean with SIMV per protocol - VAP bundle - Pulmonary hygiene - F/u ABG and CXR - PAD protocol for sedation: Precedex for goal RASS 0 to -1   S/p 3-vessel CABG CAD HLD Plan: - Postoperative care per TCTS - CT management per protocol - off neo; map goal >65 - cont ancef/vanc for surgical ppx - ASA and statin - wean insulin gtt per protocol - pt, ptt, inr, cbc, bmp pending post op  Leukocytosis Plan: -likely reactive post surgery -trend wbc/fever curve -cont abx as above for surgical ppx  Anemia Thrombocytopenia -post op Plan: -trend cbc  Previous PE: 2021 on xarelto Plan: -post op cabg; hold xarelto for now -telemetry monitoring  Prediabetes -A1c 6.2 on 3/1 Plan: -cont  insulin gtt post op -cbg monitoring  chronic back pain Plan: -pain management post op  Parkinsonism: most likely idiopathic parkinson's diease Dementia? Plan: -resume home sinemet and Aricept  Best Practice (right click and "Reselect all SmartList Selections" daily)   Diet/type: NPO w/ meds via tube DVT prophylaxis: SCD GI prophylaxis: H2B Lines: Central line and Arterial Line Foley:  Yes, and it is still needed Code Status:  full code Last date of multidisciplinary goals of care discussion [per primary]  Labs   CBC: Recent Labs  Lab 07/31/22 1526 08/04/22 0810 08/04/22 1006 08/04/22 1035 08/04/22 1128 08/04/22 1138  08/04/22 1203  WBC 11.0*  --   --   --   --   --   --   HGB 17.5*   < > 9.5* 10.5* 10.7* 9.9* 10.2*  HCT 51.7   < > 28.0* 31.0* 30.2* 29.0* 30.0*  MCV 86.5  --   --   --   --   --   --   PLT 239  --   --   --  142*  --   --    < > = values in this interval not displayed.    Basic Metabolic Panel: Recent Labs  Lab 07/31/22 1526 08/04/22 0810 08/04/22 0940 08/04/22 1001 08/04/22 1006 08/04/22 1035 08/04/22 1138 08/04/22 1203  NA 138 139 139 140 138 138 137 138  K 4.6 3.7 3.5 3.7 6.1* 4.5 4.4 4.0  CL 100 102 104  --   --  103 101 101  CO2 24  --   --   --   --   --   --   --   GLUCOSE 97 129* 99  --   --  109* 127* 131*  BUN '14 11 10  '$ --   --  '9 10 11  '$ CREATININE 1.07 0.60* 0.60*  --   --  0.60* 0.60* 0.60*  CALCIUM 9.3  --   --   --   --   --   --   --    GFR: Estimated Creatinine Clearance: 79.6 mL/min (A) (by C-G formula based on SCr of 0.6 mg/dL (L)). Recent Labs  Lab 07/31/22 1526  WBC 11.0*    Liver Function Tests: Recent Labs  Lab 07/31/22 1526  AST 39  ALT 23  ALKPHOS 57  BILITOT 1.4*  PROT 6.7  ALBUMIN 4.0   No results for input(s): "LIPASE", "AMYLASE" in the last 168 hours. No results for input(s): "AMMONIA" in the last 168 hours.  ABG    Component Value Date/Time   PHART 7.386 08/04/2022 1001   PCO2ART 40.3 08/04/2022 1001   PO2ART 358 (H) 08/04/2022 1001   HCO3 24.5 08/04/2022 1006   TCO2 27 08/04/2022 1203   ACIDBASEDEF 1.0 08/04/2022 1006   O2SAT 82 08/04/2022 1006     Coagulation Profile: Recent Labs  Lab 07/31/22 1526  INR 2.2*    Cardiac Enzymes: No results for input(s): "CKTOTAL", "CKMB", "CKMBINDEX", "TROPONINI" in the last 168 hours.  HbA1C: Hgb A1c MFr Bld  Date/Time Value Ref Range Status  07/31/2022 03:26 PM 6.2 (H) 4.8 - 5.6 % Final    Comment:    (NOTE)         Prediabetes: 5.7 - 6.4         Diabetes: >6.4         Glycemic control for adults with diabetes: <7.0     CBG: No results for input(s): "GLUCAP" in  the last  168 hours.  Review of Systems:   Patient is encephalopathic and/or intubated. Therefore history has been obtained from chart review.    Past Medical History:  He,  has a past medical history of Arthritis, Bladder calculus, Chronic back pain, Coronary artery disease, GERD (gastroesophageal reflux disease), History of colon polyps, History of kidney stones, History of prostate cancer (urologist-- dr Alinda Money), Hypercholesterolemia, Hyperlipidemia, Pneumonia (10/2019), Pulmonary emboli ([place]) (10/2019), and Wears glasses.   Surgical History:   Past Surgical History:  Procedure Laterality Date   ACHILLES TENDON SURGERY Right 06-07-2018   '@[facility]'$    reconstruction   ANTERIOR CERVICAL DECOMP/DISCECTOMY FUSION N/A 06/27/2014   Procedure: ANTERIOR CERVICAL DECOMPRESSION/DISCECTOMY FUSION C5-C7   (2 LEVELS);  Surgeon: Melina Schools, MD;  Location: [facility];  Service: Orthopedics;  Laterality: N/A;   back fusion  03/2022   CARDIAC CATHETERIZATION  2004   COLONOSCOPY     CYSTOSCOPY WITH LITHOLAPAXY N/A 03/27/2019   Procedure: CYSTOSCOPY WITH REMOVAL OF BLADDER STONE AND FOREIGN BODY;  Surgeon: Raynelle Bring, MD;  Location[facility];  Service: Urology;  Laterality: N/A;   EYE SURGERY     lazy eye as child, lasik surgery   FINGER SURGERY Right    4th finger   I & D EXTREMITY Right 07/10/2018   Procedure: IRRIGATION AND DEBRIDEMENT RIGHT ANKLE WOUND AND WOUND VAC PLACEMENT;  Surgeon: Wylene Simmer, MD;  Location: [facility];  Service: Orthopedics;  Laterality: Right;   KNEE ARTHROSCOPY Bilateral right ?;  left 05-25-2007  '@[facility]'$    LEFT HEART CATH AND CORONARY ANGIOGRAPHY N/A 07/20/2022   Procedure: LEFT HEART CATH AND CORONARY ANGIOGRAPHY;  Surgeon: Lorretta Harp, MD;  Location: [place] [facility];  Service: Cardiovascular;  Laterality: N/A;   POSTERIOR LAMINECTOMY / DECOMPRESSION LUMBAR SPINE  08-31-2016    dr elsner  '@[facility]'$    PROSTATE BIOPSY  04/13/2013   gleason 3+3=6, vol 55.4 cc    ROBOT ASSISTED LAPAROSCOPIC RADICAL PROSTATECTOMY N/A 05/22/2013   Procedure: ROBOTIC ASSISTED LAPAROSCOPIC RADICAL PROSTATECTOMY LEVEL 1;  Surgeon: Dutch Gray, MD;  Location: [facility];  Service: Urology;  Laterality: N/A;   TONSILLECTOMY       Social History:   reports that he has never smoked. He has never used smokeless tobacco. He reports current alcohol use. He reports that he does not use drugs.   Family History:  His family history includes Dementia in his mother; Heart attack in his father; Heart disease in his father; Pneumonia in his mother. There is no history of Colon cancer or Sleep apnea.   Allergies Allergies  Allergen Reactions   Lipitor [Atorvastatin] Other (See Comments)    Muscle issues   Penicillins Hives, Itching and Other (See Comments)    Has patient had a PCN reaction causing immediate rash, facial/tongue/throat swelling, SOB or lightheadedness with hypotension: No Has patient had a PCN reaction causing SEVERE RASH INVOLVING MUCUS MEMBRANES or SKIN NECROSIS: #  #  #  YES  #  #  #  Has patient had a PCN reaction that required hospitalization No Has patient had a PCN reaction occurring within the last 10 years: No      Home Medications  Prior to Admission medications   Medication Sig Start Date End Date Taking? Authorizing Provider  acetaminophen (TYLENOL) 500 MG tablet Take 500 mg by mouth daily.   Yes [provider]  aspirin EC 81 MG tablet Take 1 tablet (81 mg total) by mouth daily. Swallow whole. 07/17/22  Yes Gwenlyn Found,  Pearletha Forge, MD  carbidopa-levodopa (SINEMET IR) 25-100 MG tablet Take 1 tablet by mouth 3 (three) times daily. 02/16/22  Yes Marcial Pacas, MD  cetirizine (ZYRTEC) 10 MG tablet Take 10 mg by mouth daily.   Yes [provider]  cyanocobalamin (VITAMIN B12) 1000 MCG tablet Take 1,000 mcg by mouth daily.   Yes [provider]  donepezil (ARICEPT) 10 MG tablet Take 1 tablet (10 mg total) by mouth at bedtime. 02/16/22  Yes Marcial Pacas, MD  gabapentin (NEURONTIN) 300 MG capsule Take 300 mg by mouth at bedtime as needed (pain). 04/15/22  Yes [provider]  MELATONIN PO Take 2 tablets by mouth at bedtime. 02/13/19  Yes [provider]  methocarbamol (ROBAXIN) 500 MG tablet Take 1 tablet (500 mg total) by mouth every 6 (six) hours as needed for muscle spasms. 03/25/22  Yes Kristeen Miss, MD  omeprazole (PRILOSEC OTC) 20 MG tablet Take 20 mg by mouth in the morning.   Yes [provider]  Polyvinyl Alcohol-Povidone PF (REFRESH) 1.4-0.6 % SOLN Place 1 drop into both eyes daily as needed (luberication and dry eyes).   Yes [provider]  rivaroxaban (XARELTO) 20 MG TABS tablet Take 20 mg by mouth daily with supper.   Yes [provider]  rosuvastatin (CRESTOR) 5 MG tablet Take 1 tablet (5 mg total) by mouth daily. 07/17/22  Yes Lorretta Harp, MD     Critical care time: 45 minutes    JD Rexene Agent [facility] 08/04/2022, 12:46 PM  Please see [email or web address] for pager details.  From 7A-7P if no response, please call [phone]. After hours, please call ELink [phone].

## 2022-08-04 NOTE — Anesthesia Procedure Notes (Signed)
Central Venous Catheter Insertion Performed by: Murvin Natal, MD, anesthesiologist Start/End3/09/2022 6:50 AM, 08/04/2022 7:05 AM Patient location: Pre-op. Preanesthetic checklist: patient identified, IV checked, site marked, risks and benefits discussed, surgical consent, monitors and equipment checked, pre-op evaluation, timeout performed and anesthesia consent Position: Trendelenburg Lidocaine 1% used for infiltration Hand hygiene performed , maximum sterile barriers used  and Seldinger technique used Catheter size: 9 Fr Total catheter length 12. PA cath was placed.MAC introducer Swan type:thermodilution PA Cath depth:47 Procedure performed using ultrasound guided technique. Ultrasound Notes:anatomy identified, needle tip was noted to be adjacent to the nerve/plexus identified and no ultrasound evidence of intravascular and/or intraneural injection Attempts: 1 Following insertion, line sutured, dressing applied and Biopatch. Post procedure assessment: free fluid flow, blood return through all ports and no air  Patient tolerated the procedure well with no immediate complications.

## 2022-08-04 NOTE — Hospital Course (Addendum)
History of Present Illness:  Dejoun Karau is a 72 yo male with history of Hyperlipidemia, Hypercholesterolemia, Parkinson's Disease, H/O Pulmonary embolism and multiple back surgeries.  The patient is normally very active and an avid runner.  He noticed he began experiencing chest tightness which was exertional.  He was evaluated by Dr. Gwenlyn Found who obtained a CTA on 07/15/2022 which revealed and calcium score of 440 with significant disease of RCA.  Due to this finding outpatient cardiac catheterization was performed and showed preserved EF and multivessel CAD.  It was felt coronary bypass grafting would be indicated and he was referred to [facility].  He was evaluated by Dr. Tenny Craw who felt the patient would be a candidate for coronary bypass grafting procedure.  The risks and benefits of the procedure were explained to the patient and he was agreeable to proceed.  Hospital Course:  Beny Buras presented to [facility] on 08/04/2022.  He was taken to the operating room and underwent CABG x 3 utilizing LIMA to LAD, SVG to Diagonal, and SVG to PDA.  He also underwent endoscopic harvest of greater saphenous vein from his right leg.  He tolerated the procedure without difficulty and was taken to the SICU in stable condition. The patient was extubated the evening of surgery.  He required Neo-synephrine for BP support which was weaned as hemodynamics allowed.  He was treated with IV Lasix for mildly volume overloaded state.  He was tachycardic and started on Lopressor.  His chest tubes and pacing wires were removed without difficulty.  PT/OT consult were obtained.  They recommended inpatient rehab.  The patient developed diarrhea and his bowel regimen was adjusted.  He remained in NSR and was stable for transfer to the progressive unit on 08/07/2022.  He was hypokalemic and supplemented accordingly.  He will be resumed on Xarelto prior to discharge.

## 2022-08-04 NOTE — Progress Notes (Signed)
      [address]       [place] [address]             [phone]      S/p CABG. LAA clip  Intubated, starting to wake up  BP 100/74   Pulse 80   Temp (!) 97.2 F (36.2 C)   Resp 16   Ht '5\' 8"'$  (624THL m)   Wt 73.9 kg   SpO2 98%   BMI 24.78 kg/m  24/13 CI only 1.4 but co-ox 66   Intake/Output Summary (Last 24 hours) at 08/04/2022 1738 Last data filed at 08/04/2022 1700 Gross per 24 hour  Intake 3587.93 ml  Output 2960 ml  Net 627.93 ml   K= 4.7 Hct 30  Stable POD # 1  Wean to extubate  Remo Lipps C. Roxan Hockey, MD [facility] [phone]

## 2022-08-04 NOTE — Progress Notes (Signed)
H and P reviewed, no sig updates, plan for OR today      [address]       [place] [address]             [phone]                                                   [facility] L [facility] Medical Record [id number] Date of Birth: September 11, 1950   Referring: Lorretta Harp, MD Primary Care: Shon Baton, MD Primary Cardiologist: None   Chief Complaint:        Chief Complaint  Patient presents with   Coronary Artery Disease      Surgical consult, Cardiac Cath and ECHO 07/20/22      History of Present Illness:    ELREE BOENING 72 y.o. male with symptomatic CAD and escalating symptoms of angina referred for consideration of CABG   Here are my notes from our encounter. Not clear why they are in strikethrough.        Per recent cardiology HPI TARAJ YOFFE is a 72 y.o.  thin and fit appearing married Caucasian male father of 2 children, grandfather of 2 grandchildren referred by Dr. Henrene Pastor, gastroenterology, for preoperative clearance before his scheduled endoscopy.  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  I last saw him in the office 05/05/2022.  He has no cardiac risk factors other than family history with a father that died at age 73 of myocardial infarction.  He is never had a heart attack or stroke.  He did have a pulmonary embolism 5 years ago and has been on Xarelto since.  He was an active runner for a long time and has had multiple back surgeries most recently by Dr. Ellene Route 6 weeks ago for which she stopped his Xarelto 2 days before.  He is very active but has complained of some exertional chest tightness.   Since I saw him 2 months ago I did get a coronary CTA on 07/15/2022 revealing a coronary calcium score of 440 with significant disease in his RCA by FFR analysis.  Based on this, we decided to proceed with outpatient radial diagnostic coronary angiography.  I have asked him to hold his Xarelto 2 days before the procedure.        Past Medical History:  Diagnosis Date   Arthritis      neck, back, knees, right ankle   Bladder calculus     Chronic back pain     GERD (gastroesophageal reflux disease)     History of colon polyps      benign   History of kidney stones      and bladder stone   History of prostate cancer urologist-- dr Alinda Money    dx 2014---  s/p  prostatectomy 05-22-2013 ,  Gleason 3+3   Hypercholesterolemia      takes Crestor daily   Hyperlipidemia     Pneumonia 10/2019   Pulmonary emboli ([place]) 10/2019   Wears glasses           Past Surgical History:  Procedure Laterality Date   ACHILLES TENDON SURGERY Right 06-07-2018   '@[facility]'$     reconstruction   ANTERIOR CERVICAL DECOMP/DISCECTOMY FUSION N/A 06/27/2014    Procedure: ANTERIOR CERVICAL DECOMPRESSION/DISCECTOMY FUSION C5-C7   (2  LEVELS);  Surgeon: Melina Schools, MD;  Location: [facility];  Service: Orthopedics;  Laterality: N/A;   back fusion   03/2022   CARDIAC CATHETERIZATION   2004   COLONOSCOPY       CYSTOSCOPY WITH LITHOLAPAXY N/A 03/27/2019    Procedure: CYSTOSCOPY WITH REMOVAL OF BLADDER STONE AND FOREIGN BODY;  Surgeon: Raynelle Bring, MD;  Location: [facility];  Service: Urology;  Laterality: N/A;   EYE SURGERY        lazy eye as child, lasik surgery   FINGER SURGERY Right      4th finger   I & D EXTREMITY Right 07/10/2018    Procedure: IRRIGATION AND DEBRIDEMENT RIGHT ANKLE WOUND AND WOUND VAC PLACEMENT;  Surgeon: Wylene Simmer, MD;  Location: [facility];  Service: Orthopedics;  Laterality: Right;   KNEE ARTHROSCOPY Bilateral right ?;  left 05-25-2007  '@[facility]'$    LEFT HEART CATH AND CORONARY ANGIOGRAPHY N/A 07/20/2022    Procedure: LEFT HEART CATH AND CORONARY ANGIOGRAPHY;  Surgeon: Lorretta Harp, MD;  Location: [facility];  Service: Cardiovascular;  Laterality: N/A;   POSTERIOR LAMINECTOMY / DECOMPRESSION LUMBAR SPINE   08-31-2016    dr elsner  '@[facility]'$    PROSTATE BIOPSY   04/13/2013    gleason 3+3=6, vol 55.4 cc   ROBOT  ASSISTED LAPAROSCOPIC RADICAL PROSTATECTOMY N/A 05/22/2013    Procedure: ROBOTIC ASSISTED LAPAROSCOPIC RADICAL PROSTATECTOMY LEVEL 1;  Surgeon: Dutch Gray, MD;  Location: [facility];  Service: Urology;  Laterality: N/A;   TONSILLECTOMY               Family History  Problem Relation Age of Onset   Pneumonia Mother          bacterial   Dementia Mother     Heart disease Father     Heart attack Father     Colon cancer Neg Hx     Sleep apnea Neg Hx          Social History       Tobacco Use  Smoking Status Never  Smokeless Tobacco Never    Social History        Substance and Sexual Activity  Alcohol Use Yes   Alcohol/week: 0.0 standard drinks of alcohol    Comment: occ             Allergies  Allergen Reactions   Lipitor [Atorvastatin] Other (See Comments)      Muscle issues   Penicillins Hives, Itching and Other (See Comments)      Has patient had a PCN reaction causing immediate rash, facial/tongue/throat swelling, SOB or lightheadedness with hypotension: No Has patient had a PCN reaction causing SEVERE RASH INVOLVING MUCUS MEMBRANES or SKIN NECROSIS: #  #  #  YES  #  #  #  Has patient had a PCN reaction that required hospitalization No Has patient had a PCN reaction occurring within the last 10 years: No              Current Outpatient Medications  Medication Sig Dispense Refill   acetaminophen (TYLENOL) 500 MG tablet Take 500 mg by mouth daily.       aspirin EC 81 MG tablet Take 1 tablet (81 mg total) by mouth daily. Swallow whole.       carbidopa-levodopa (SINEMET IR) 25-100 MG tablet Take 1 tablet by mouth 3 (three) times daily. 270 tablet 3   cetirizine (ZYRTEC) 10 MG tablet Take 10 mg by mouth daily.  cyanocobalamin (VITAMIN B12) 1000 MCG tablet Take 1,000 mcg by mouth daily.       donepezil (ARICEPT) 10 MG tablet Take 1 tablet (10 mg total) by mouth at bedtime. 90 tablet 3   gabapentin (NEURONTIN) 300 MG capsule Take 300 mg by mouth at bedtime as needed  (pain).       MELATONIN PO Take 2 tablets by mouth at bedtime.       methocarbamol (ROBAXIN) 500 MG tablet Take 1 tablet (500 mg total) by mouth every 6 (six) hours as needed for muscle spasms. 40 tablet 3   omeprazole (PRILOSEC OTC) 20 MG tablet Take 20 mg by mouth in the morning.       Polyvinyl Alcohol-Povidone PF (REFRESH) 1.4-0.6 % SOLN Place 1 drop into both eyes daily as needed (luberication and dry eyes).       rivaroxaban (XARELTO) 20 MG TABS tablet Take 20 mg by mouth daily with supper.       rosuvastatin (CRESTOR) 5 MG tablet Take 1 tablet (5 mg total) by mouth daily. 90 tablet 3    No current facility-administered medications for this visit.      ROS 14 point ROS reviewed and negative except as per HPI     PHYSICAL EXAMINATION: BP 119/75   Pulse 87   Resp 20   Ht 5' 7.5" (1.715 m)   Wt 164 lb (74.4 kg)   SpO2 95% Comment: RA  BMI 25.31 kg/m    Gen: NAD Neuro: Alert and oriented Resp: Nonlaboured Abd: Soft, ntnd Extr: WWP   Diagnostic Studies & Laboratory data:     Recent Radiology Findings:    Imaging Results  ECHOCARDIOGRAM COMPLETE   Result Date: 07/20/2022    ECHOCARDIOGRAM REPORT   Patient Name:   JAMES GUARDADO Date of Exam: 07/20/2022 Medical Rec #:  [id number]      Height:       67.5 in Accession #:    [id number]     Weight:       164.0 lb Date of Birth:  04/28/1951      BSA:          1.869 m Patient Age:    72 years       BP:           116/78 mmHg Patient Gender: M              HR:           76 bpm. Exam Location:  Inpatient Procedure: 2D Echo, Color Doppler, Cardiac Doppler and Intracardiac            Opacification Agent Indications:    CAD Native Vessel i25.10  History:        Patient has prior history of Echocardiogram examinations, most                 recent 08/13/2020. CAD; Risk Factors:Hypertension and                 Dyslipidemia.  Sonographer:    Raquel Sarna Senior RDCS Referring Phys: [place] Comments: Unable to reposition due to  post cath restrictions. IMPRESSIONS  1. Left ventricular ejection fraction, by estimation, is 60 to 65%. The left ventricle has normal function. The left ventricle has no regional wall motion abnormalities. Left ventricular diastolic parameters are consistent with Grade I diastolic dysfunction (impaired relaxation).  2. Right ventricular systolic function is normal. The right ventricular size is normal.  3.  The mitral valve is normal in structure. No evidence of mitral valve regurgitation. No evidence of mitral stenosis.  4. The aortic valve is tricuspid. There is moderate calcification of the aortic valve. Aortic valve regurgitation is not visualized. No aortic stenosis is present.  5. The inferior vena cava is normal in size with greater than 50% respiratory variability, suggesting right atrial pressure of 3 mmHg. FINDINGS  Left Ventricle: Left ventricular ejection fraction, by estimation, is 60 to 65%. The left ventricle has normal function. The left ventricle has no regional wall motion abnormalities. Definity contrast agent was given IV to delineate the left ventricular  endocardial borders. The left ventricular internal cavity size was normal in size. There is no left ventricular hypertrophy. Left ventricular diastolic parameters are consistent with Grade I diastolic dysfunction (impaired relaxation). Right Ventricle: The right ventricular size is normal. No increase in right ventricular wall thickness. Right ventricular systolic function is normal. Left Atrium: Left atrial size was normal in size. Right Atrium: Right atrial size was normal in size. Pericardium: There is no evidence of pericardial effusion. Mitral Valve: The mitral valve is normal in structure. Mild mitral annular calcification. No evidence of mitral valve regurgitation. No evidence of mitral valve stenosis. Tricuspid Valve: The tricuspid valve is normal in structure. Tricuspid valve regurgitation is trivial. No evidence of tricuspid stenosis.  Aortic Valve: The aortic valve is tricuspid. There is moderate calcification of the aortic valve. Aortic valve regurgitation is not visualized. No aortic stenosis is present. Pulmonic Valve: The pulmonic valve was normal in structure. Pulmonic valve regurgitation is not visualized. No evidence of pulmonic stenosis. Aorta: The aortic root is normal in size and structure. Venous: The inferior vena cava is normal in size with greater than 50% respiratory variability, suggesting right atrial pressure of 3 mmHg. IAS/Shunts: No atrial level shunt detected by color flow Doppler.  LEFT VENTRICLE PLAX 2D LVIDd:         3.80 cm   Diastology LVIDs:         2.30 cm   LV e' medial:    5.66 cm/s LV PW:         0.70 cm   LV E/e' medial:  11.6 LV IVS:        0.90 cm   LV e' lateral:   7.94 cm/s LVOT diam:     1.90 cm   LV E/e' lateral: 8.2 LV SV:         49 LV SV Index:   26 LVOT Area:     2.84 cm  RIGHT VENTRICLE RV S prime:     12.90 cm/s TAPSE (M-mode): 1.7 cm LEFT ATRIUM           Index        RIGHT ATRIUM          Index LA diam:      2.20 cm 1.18 cm/m   RA Area:     9.54 cm LA Vol (A2C): 31.2 ml 16.69 ml/m  RA Volume:   19.60 ml 10.49 ml/m LA Vol (A4C): 12.0 ml 6.42 ml/m  AORTIC VALVE LVOT Vmax:   86.20 cm/s LVOT Vmean:  64.700 cm/s LVOT VTI:    0.172 m  AORTA Ao Root diam: 3.30 cm Ao Asc diam:  3.70 cm MITRAL VALVE MV Area (PHT): 2.62 cm    SHUNTS MV Decel Time: 289 msec    Systemic VTI:  0.17 m MV E velocity: 65.50 cm/s  Systemic Diam: 1.90 cm MV A velocity:  81.20 cm/s MV E/A ratio:  0.81 Glori Bickers MD Electronically signed by Glori Bickers MD Signature Date/Time: 07/20/2022/1:16:10 PM    Final     CARDIAC CATHETERIZATION   Result Date: 07/20/2022 Images from the original result were not included.   Prox LAD to Mid LAD lesion is 95% stenosed.   Dist RCA lesion is 95% stenosed.   Prox RCA-1 lesion is 90% stenosed.   Prox RCA-2 lesion is 60% stenosed.   The left ventricular systolic function is normal.   LV  end diastolic pressure is low.   The left ventricular ejection fraction is 50-55% by visual estimate. SAURAV SHARER is a 72 y.o. male  [id number] LOCATION:  FACILITY: [facility] PHYSICIAN: Quay Burow, M.D. [birth year] DATE OF PROCEDURE:  07/20/2022 DATE OF DISCHARGE: CARDIAC CATHETERIZATION History obtained from chart review. KAMARON BRYMER is a 72 y.o.  thin and fit appearing married Caucasian male father of 2 children, grandfather of 2 grandchildren referred by Dr. Henrene Pastor, gastroenterology, for preoperative clearance before his scheduled endoscopy.  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  I last saw him in the office 05/05/2022.  He has no cardiac risk factors other than family history with a father that died at age 21 of myocardial infarction.  He is never had a heart attack or stroke.  He did have a pulmonary embolism 5 years ago and has been on Xarelto since.  He was an active runner for a long time and has had multiple back surgeries most recently by Dr. Ellene Route 6 weeks ago for which she stopped his Xarelto 2 days before.  He is very active but has complained of some exertional chest tightness.  Since I saw him 2 months ago I did get a coronary CTA on 07/15/2022 revealing a coronary calcium score of 440 with significant disease in his RCA by FFR analysis.  Based on this, we decided to proceed with outpatient radial diagnostic coronary angiography.  I have asked him to hold his Xarelto 2 days before the procedure.    Mr. Piech has two-vessel disease.  He has a 95% proximal LAD stenosis just after a medium sized first diagonal branch.  He also has a 90% proximal RCA stenosis in the middle of a 60% segmental stenosis after the first band and a 95% stenosis distally at the crux after a segment of tortuosity.  He has a small PDA and a larger PLA with normal LV function.  He is not diabetic.  At this point, I am going to arrange for him to see cardiothoracic surgery to explore surgical  revascularization options using all arterial conduit grafts.  The sheath was removed and a TR band was placed on the right wrist to achieve patent hemostasis.  The patient left lab in stable condition.  He will be discharged home later this morning and arrangements will be made for outpatient surgical consult.  I told him to begin his Xarelto tomorrow. Quay Burow. MD, [facility] 07/20/2022 8:24 AM     CT CORONARY MORPH W/CTA COR W/SCORE W/CA W/CM &/OR WO/CM   Addendum Date: 07/18/2022   ADDENDUM REPORT: 07/18/2022 21:59 EXAM: OVER-READ INTERPRETATION  CT CHEST The following report is an over-read performed by radiologist Dr. Inez Catalina of [facility], [place] on 07/18/2022. This over-read does not include interpretation of cardiac or coronary anatomy or pathology. The coronary calcium score/coronary CTA interpretation by the cardiologist is attached. COMPARISON:  06/24/2022 FINDINGS: Cardiovascular: There are no significant extracardiac  vascular findings. Mediastinum/Nodes: There are no enlarged lymph nodes within the visualized mediastinum.The esophagus as visualized is within normal limits. Lungs/Pleura: Lungs are well aerated without focal infiltrate. Minimal scarring is noted in the right lower lobe laterally stable from multiple previous exams dating back to 2022. No parenchymal nodules are noted. No effusion is seen. Upper abdomen: No significant findings in the visualized upper abdomen. Musculoskeletal/Chest wall: No chest wall mass or suspicious osseous findings within the visualized chest. IMPRESSION: No significant extracardiac findings within the visualized chest. Electronically Signed   By: Inez Catalina M.D.   On: 07/18/2022 21:59    Result Date: 07/18/2022 CLINICAL DATA:  Chest pain EXAM: Cardiac CTA MEDICATIONS: Sub lingual nitro.  '4mg'$  TECHNIQUE: The patient was scanned on a Siemens Force [id number] slice scanner. Gantry rotation speed was 250 msecs. Collimation was .6 mm. A 120 kV prospective scan was  triggered in the ascending thoracic aorta at 140 HU's Full mA was used between 35% and 75% of the R-R interval. Average HR during the scan was 51 bpm. The 3D data set was interpreted on a dedicated work station using MPR, MIP and VRT modes. A total of 80 cc of contrast was used. FINDINGS: Non-cardiac: See separate report from [facility]. No significant findings on limited lung and soft tissue windows. Calcium Score: Calcium noted in RCA and LAD LM 0 LAD 363 RCA 77 LCX 0 Total 440 which is 65 th percentile for age/sex Coronary Arteries: Right dominant with no anomalies LM: Normal LAD: 25-49% mixed plaque in proximal and mid vessel IM: Small vessel normal D1: Small vessel normal D2: Large vessel normal disease in LAD at ostium Circumflex: 1-24% soft plaque in proximal vessel OM1: Normal OM2: Normal RCA: Likely sub total occlusion 70-99% in proximal vessel No opacification seen on all phases Distal to this area 25-49% calcified plaque and significant soft plaque in distal vessel PDA: Normal PLA: Normal IMPRESSION: 1. Calcium score 440 which is 65 th percentile for age/sex 2.  Upper normal ascending thoracic aorta diameter 3.7 cm 3. Likely sub total occlusion 70-99% of proximal RCA with high soft plaque burden Study sent for [facility] [place] Electronically Signed: By: [place] M.D. On: 07/15/2022 14:49    CT CORONARY FRACTIONAL FLOW RESERVE FLUID ANALYSIS   Result Date: 07/15/2022 CLINICAL DATA:  CAD EXAM: FFR CT TECHNIQUE: The best systolic and diastolic phases of the patients gated cardiac CTA sent to heart flow for hemodynamic analysis FINDINGS: LM, LAD, LCX all normal RCA markedly abnormal with proximal FFR 0.74 decreasing to 0.50 after a 2nd distal stenosis modeled IMPRESSION: Markedly positive FFR CT involving the proximal RCA [place] Electronically Signed   By: [place] M.D.   On: 07/15/2022 14:52           I have independently reviewed the above radiology studies  and reviewed  the findings with the patient.    Recent Lab Findings: Recent Labs       Lab Results  Component Value Date    WBC 7.7 07/17/2022    HGB 17.1 07/17/2022    HCT 50.4 07/17/2022    PLT 237 07/17/2022    GLUCOSE 138 (H) 07/09/2022    ALT 25 07/07/2021    AST 24 07/07/2021    NA 140 07/09/2022    K 4.2 07/09/2022    CL 102 07/09/2022    CREATININE 0.95 07/09/2022    BUN 19 07/09/2022    CO2 19 (L) 07/09/2022  TSH 2.520 09/29/2021    INR 1.0 08/12/2020          Assessment / Plan:   17M with symptomatic CAD presents for CAB evaluation.   Plan for CAB in 2 weeks.     Risks/benefits/alternatives were discussed at length (85% straightforward recovery, 12% morbidity [any organ, predominantly age-related issues I.e. tissue quality, 3% mortality]. We discussed it is possible to have extensive bleeding or clotting given history of PE.  Will stop his therapeutic AC 48 hours before surgery.  Offered bridging AC, patient deferred.    Straightforward recovery typically entails 1-2 ICU days, 3-4 days on the floor, seeing me back in clinic at 1 week and 1 month postoperatively. No lifting greater than 30 lbs for 6 weeks. Total recovery expected by 2 months. All questions were asked and answered.        Pierre Bali Cullan Launer 07/31/2022 8:04 AM

## 2022-08-04 NOTE — Transfer of Care (Signed)
Immediate Anesthesia Transfer of Care Note  Patient: Terry [name]  Procedure(s) Performed: CORONARY ARTERY BYPASS GRAFTING (CABG) X THREE USING LEFT INTERNAL MAMMARY ARTERY (LIMA) AND ENDOSCOPICALLY HARVESTED RIGHT GREATER SAPHENOUS VEIN. (Chest) TRANSESOPHAGEAL ECHOCARDIOGRAM CLIPPING OF ATRIAL APPENDAGE USING AN ATRICURE 58 ATRICLIP (Left: Chest)  Patient Location: SICU  Anesthesia Type:General  Level of Consciousness: sedated and Patient remains intubated per anesthesia plan  Airway & Oxygen Therapy: Patient remains intubated per anesthesia plan and Patient placed on Ventilator (see vital sign flow sheet for setting)  Post-op Assessment: Report given to RN and Post -op Vital signs reviewed and stable  Post vital signs: Reviewed and stable  Last Vitals:  Vitals Value Taken Time  BP 107/66 08/04/22 1339  Temp    Pulse 73 08/04/22 1339  Resp 18 08/04/22 1339  SpO2 100 % 08/04/22 1339  Vitals shown include unvalidated device data.  Last Pain:  Vitals:   08/04/22 0629  TempSrc:   PainSc: 0-No pain         Complications: No notable events documented.

## 2022-08-04 NOTE — Anesthesia Preprocedure Evaluation (Addendum)
Anesthesia Evaluation  Patient identified by MRN, date of birth, ID band Patient awake    Reviewed: Allergy & Precautions, NPO status , Patient's Chart, lab work & pertinent test results, reviewed documented beta blocker date and time   Airway Mallampati: II  TM Distance: >3 FB Neck ROM: Full    Dental no notable dental hx.    Pulmonary PE   Pulmonary exam normal        Cardiovascular hypertension, Pt. on medications + CAD  Normal cardiovascular exam     Neuro/Psych  PSYCHIATRIC DISORDERS Anxiety      Neuromuscular disease    GI/Hepatic Neg liver ROS,GERD  Medicated and Controlled,,  Endo/Other  negative endocrine ROS    Renal/GU negative Renal ROS     Musculoskeletal  (+) Arthritis ,    Abdominal   Peds  Hematology  (+) Blood dyscrasia (Xarelto)   Anesthesia Other Findings CAD  Reproductive/Obstetrics                             Anesthesia Physical Anesthesia Plan  ASA: 4  Anesthesia Plan: General   Post-op Pain Management:    Induction: Intravenous  PONV Risk Score and Plan: 2 and Ondansetron, Dexamethasone, Midazolam and Treatment may vary due to age or medical condition  Airway Management Planned: Oral ETT  Additional Equipment: Arterial line, CVP, PA Cath, TEE and Ultrasound Guidance Line Placement  Intra-op Plan:   Post-operative Plan: Post-operative intubation/ventilation  Informed Consent: I have reviewed the patients History and Physical, chart, labs and discussed the procedure including the risks, benefits and alternatives for the proposed anesthesia with the patient or authorized representative who has indicated his/her understanding and acceptance.     Dental advisory given  Plan Discussed with: CRNA  Anesthesia Plan Comments:         Anesthesia Quick Evaluation

## 2022-08-04 NOTE — Anesthesia Postprocedure Evaluation (Signed)
Anesthesia Post Note  Patient: Terry [name]  Procedure(s) Performed: CORONARY ARTERY BYPASS GRAFTING (CABG) X THREE USING LEFT INTERNAL MAMMARY ARTERY (LIMA) AND ENDOSCOPICALLY HARVESTED RIGHT GREATER SAPHENOUS VEIN. (Chest) TRANSESOPHAGEAL ECHOCARDIOGRAM CLIPPING OF ATRIAL APPENDAGE USING AN ATRICURE 45 ATRICLIP (Left: Chest)     Patient location during evaluation: SICU Anesthesia Type: General Level of consciousness: sedated Pain management: pain level controlled Vital Signs Assessment: post-procedure vital signs reviewed and stable Respiratory status: patient remains intubated per anesthesia plan Cardiovascular status: stable Postop Assessment: no apparent nausea or vomiting Anesthetic complications: no   No notable events documented.  Last Vitals:  Vitals:   08/04/22 1545 08/04/22 1600  BP:    Pulse: 71 70  Resp: 19 14  Temp: (!) 35.6 C (!) 35.7 C  SpO2: 100% 100%    Last Pain:  Vitals:   08/04/22 1600  TempSrc: Core  PainSc:                  Karyl Kinnier Chelesea Weiand

## 2022-08-04 NOTE — Op Note (Addendum)
OPERATIVE NOTE: Patient Name: Terry [name] Date of Birth: 1950-08-17 Date of Operation: 08/04/22  PRE-OPERATIVE DIAGNOSIS: Coronary artery disease' History of multiple pulmonary emboli  POST-OPERATIVE DIAGNOSIS: Same  OPERATION: CABG x 3 (LIMA - LAD, SVG - Diag, SVG - PDA) Endoscopic saphenous vein harvest Placement of 65m Atriclip on left atrial appendage  SURGEON: DPierre BaliEnter MD   ASSISTANT: EEllwood HandlerPA and BMel AlmondPA  EBL 100cc  FINDINGS: EF  normal before surgery EF normal after surgery   Adequate conduits and targets LAD 2.0 mm Diag 1.25 mm, flow 54 cc/min PDA 1.75 mm, flow 20 cc/min   TIMES: XC:  91 min CPB:  120 min   SPECIMENS None   COMPLICATIONS: None  TUBES:  2 24 Fr blakes (left pleural and mediastinal) 1 JP to Bulb (mediastinal)  HISTORY: 17M with symptomatic CAD presents for CAB evaluation.  Risks/benefits/alternatives were discussed at length (85% straightforward recovery, 12% morbidity [any organ, predominantly age-related issues I.e. tissue quality, 3% mortality]. We discussed it is possible to have extensive bleeding or clotting given history of PE.  Stopped his therapeutic AC 48 hours before surgery.  Offered bridging AC, patient deferred.    Straightforward recovery typically entails 1-2 ICU days, 3-4 days on the floor, seeing me back in clinic at 1 week and 1 month postoperatively. No lifting greater than 30 lbs for 6 weeks. Total recovery expected by 2 months. All questions were asked and answered.    PROCEDURE IN DETAIL: The patient was brought in the operating room and laid in supine position.  The patient was prepped and draped in standard fashion.  An arterial, pulmonary arterial and venous lines were placed by anesthesia along with a single-lumen endotracheal tube. Vein was harvested out of the right leg endoscopically by the physician assistant. Local analgesia given to the sternum. Sternotomy was performed and LIMA taken  down after 5000u heparin given. The pericardium was opened. There were minor arrythmias at this point. Full dose heparin was given to achieve an ACT of 480 and aortic and venous cannulas were inserted.The patient was placed on cardiopulmonary bypass and using a cross-clamp and cardiac arrest was achieved with 1.2  L of modified blood [place] cardioplegia antegrade. Topical cooling was used as well on the heart.  Diag sewn distally with 7-0 prolene, then proximal with 6-0 prolene. 498mAtriclip placed on left atrial appendage. PDA sewn distally with 7-0 prolene, then proximal to the aorta with 6-0 prolene.  After vein grafts complete, LIMA sewn to LAD with 7-0 prolene.     Next, the cross-clamp was released  with the root vent on.  After de-airing the heart came back into regular sinus rhythm and the heart took over the circulation. Bypass was weaned and cannulas were removed, then protamine was given and hemostasis was achieved. Chest tubes were placed as above along with ventricular and atrial wires.  The chest was then closed in interrupted steel wire and the presternal layers were closed in 3 layers of absorbable suture.  The patient had a stable status and was transferred to the postoperative care unit on zero of epinephrine and zero of Levophed. BP was labile during the case. Had nitro in line to ICU so nurses could titrate to MAP 70-90.  All surgical counts were correct.

## 2022-08-05 ENCOUNTER — Encounter (HOSPITAL_COMMUNITY): Payer: Self-pay | Admitting: Cardiothoracic Surgery

## 2022-08-05 ENCOUNTER — Inpatient Hospital Stay (HOSPITAL_COMMUNITY): Payer: Medicare Other

## 2022-08-05 ENCOUNTER — Other Ambulatory Visit: Payer: Self-pay

## 2022-08-05 DIAGNOSIS — J9600 Acute respiratory failure, unspecified whether with hypoxia or hypercapnia: Secondary | ICD-10-CM | POA: Diagnosis not present

## 2022-08-05 LAB — CBC
HCT: 32.3 % — ABNORMAL LOW (ref 39.0–52.0)
HCT: 32.3 % — ABNORMAL LOW (ref 39.0–52.0)
Hemoglobin: 10.6 g/dL — ABNORMAL LOW (ref 13.0–17.0)
Hemoglobin: 10.8 g/dL — ABNORMAL LOW (ref 13.0–17.0)
MCH: 29 pg (ref 26.0–34.0)
MCH: 29.3 pg (ref 26.0–34.0)
MCHC: 32.8 g/dL (ref 30.0–36.0)
MCHC: 33.4 g/dL (ref 30.0–36.0)
MCV: 88.3 fL (ref 80.0–100.0)
MCV: 87.5 fL (ref 80.0–100.0)
Platelets: 141 10*3/uL — ABNORMAL LOW (ref 150–400)
Platelets: 145 10*3/uL — ABNORMAL LOW (ref 150–400)
RBC: 3.66 MIL/uL — ABNORMAL LOW (ref 4.22–5.81)
RBC: 3.69 MIL/uL — ABNORMAL LOW (ref 4.22–5.81)
RDW: 14.6 % (ref 11.5–15.5)
RDW: 14.7 % (ref 11.5–15.5)
WBC: 9.4 10*3/uL (ref 4.0–10.5)
WBC: 9 10*3/uL (ref 4.0–10.5)
nRBC: 0 % (ref 0.0–0.2)
nRBC: 0 % (ref 0.0–0.2)

## 2022-08-05 LAB — POCT I-STAT 7, (LYTES, BLD GAS, ICA,H+H)
Acid-base deficit: 5 mmol/L — ABNORMAL HIGH (ref 0.0–2.0)
Acid-base deficit: 7 mmol/L — ABNORMAL HIGH (ref 0.0–2.0)
Bicarbonate: 19.1 mmol/L — ABNORMAL LOW (ref 20.0–28.0)
Bicarbonate: 20.8 mmol/L (ref 20.0–28.0)
Calcium, Ion: 1.19 mmol/L (ref 1.15–1.40)
Calcium, Ion: 1.22 mmol/L (ref 1.15–1.40)
HCT: 30 % — ABNORMAL LOW (ref 39.0–52.0)
HCT: 31 % — ABNORMAL LOW (ref 39.0–52.0)
Hemoglobin: 10.2 g/dL — ABNORMAL LOW (ref 13.0–17.0)
Hemoglobin: 10.5 g/dL — ABNORMAL LOW (ref 13.0–17.0)
O2 Saturation: 99 %
O2 Saturation: 99 %
Patient temperature: 36.8
Patient temperature: 37.1
Potassium: 4 mmol/L (ref 3.5–5.1)
Potassium: 4.1 mmol/L (ref 3.5–5.1)
Sodium: 137 mmol/L (ref 135–145)
Sodium: 138 mmol/L (ref 135–145)
TCO2: 20 mmol/L — ABNORMAL LOW (ref 22–32)
TCO2: 22 mmol/L (ref 22–32)
pCO2 arterial: 39.7 mmHg (ref 32–48)
pCO2 arterial: 40.8 mmHg (ref 32–48)
pH, Arterial: 7.289 — ABNORMAL LOW (ref 7.35–7.45)
pH, Arterial: 7.316 — ABNORMAL LOW (ref 7.35–7.45)
pO2, Arterial: 127 mmHg — ABNORMAL HIGH (ref 83–108)
pO2, Arterial: 137 mmHg — ABNORMAL HIGH (ref 83–108)

## 2022-08-05 LAB — GLUCOSE, CAPILLARY
Glucose-Capillary: 114 mg/dL — ABNORMAL HIGH (ref 70–99)
Glucose-Capillary: 119 mg/dL — ABNORMAL HIGH (ref 70–99)
Glucose-Capillary: 125 mg/dL — ABNORMAL HIGH (ref 70–99)
Glucose-Capillary: 126 mg/dL — ABNORMAL HIGH (ref 70–99)
Glucose-Capillary: 140 mg/dL — ABNORMAL HIGH (ref 70–99)
Glucose-Capillary: 140 mg/dL — ABNORMAL HIGH (ref 70–99)
Glucose-Capillary: 141 mg/dL — ABNORMAL HIGH (ref 70–99)
Glucose-Capillary: 142 mg/dL — ABNORMAL HIGH (ref 70–99)
Glucose-Capillary: 142 mg/dL — ABNORMAL HIGH (ref 70–99)
Glucose-Capillary: 149 mg/dL — ABNORMAL HIGH (ref 70–99)
Glucose-Capillary: 151 mg/dL — ABNORMAL HIGH (ref 70–99)
Glucose-Capillary: 174 mg/dL — ABNORMAL HIGH (ref 70–99)

## 2022-08-05 LAB — BASIC METABOLIC PANEL
Anion gap: 7 (ref 5–15)
Anion gap: 9 (ref 5–15)
BUN: 11 mg/dL (ref 8–23)
BUN: 11 mg/dL (ref 8–23)
CO2: 22 mmol/L (ref 22–32)
CO2: 22 mmol/L (ref 22–32)
Calcium: 8 mg/dL — ABNORMAL LOW (ref 8.9–10.3)
Calcium: 8.3 mg/dL — ABNORMAL LOW (ref 8.9–10.3)
Chloride: 107 mmol/L (ref 98–111)
Chloride: 103 mmol/L (ref 98–111)
Creatinine, Ser: 0.85 mg/dL (ref 0.61–1.24)
Creatinine, Ser: 0.87 mg/dL (ref 0.61–1.24)
GFR, Estimated: >60 mL/min (ref >60)
GFR, Estimated: >60 mL/min (ref >60)
Glucose, Bld: 126 mg/dL — ABNORMAL HIGH (ref 70–99)
Glucose, Bld: 127 mg/dL — ABNORMAL HIGH (ref 70–99)
Potassium: 4.2 mmol/L (ref 3.5–5.1)
Potassium: 3.9 mmol/L (ref 3.5–5.1)
Sodium: 136 mmol/L (ref 135–145)
Sodium: 134 mmol/L — ABNORMAL LOW (ref 135–145)

## 2022-08-05 LAB — MAGNESIUM
Magnesium: 2.5 mg/dL — ABNORMAL HIGH (ref 1.7–2.4)
Magnesium: 2.1 mg/dL (ref 1.7–2.4)

## 2022-08-05 MED ORDER — GABAPENTIN 100 MG PO CAPS
100.0000 mg | ORAL_CAPSULE | Freq: Two times a day (BID) | ORAL | Status: DC
  Administered 2022-08-05 – 2022-08-06 (×4): 100 mg via ORAL
  Filled 2022-08-05 (×5): qty 1

## 2022-08-05 MED ORDER — ENOXAPARIN SODIUM 40 MG/0.4ML IJ SOSY
40.0000 mg | PREFILLED_SYRINGE | Freq: Every day | INTRAMUSCULAR | Status: AC
  Administered 2022-08-05 – 2022-08-08 (×4): 40 mg via SUBCUTANEOUS
  Filled 2022-08-05 (×4): qty 0.4

## 2022-08-05 MED ORDER — INSULIN ASPART 100 UNIT/ML IJ SOLN
0.0000 [IU] | INTRAMUSCULAR | Status: DC
  Administered 2022-08-05 (×4): 2 [IU] via SUBCUTANEOUS
  Administered 2022-08-06: 4 [IU] via SUBCUTANEOUS
  Administered 2022-08-06 – 2022-08-07 (×5): 2 [IU] via SUBCUTANEOUS

## 2022-08-05 MED ORDER — FUROSEMIDE 10 MG/ML IJ SOLN
20.0000 mg | Freq: Once | INTRAMUSCULAR | Status: AC
  Administered 2022-08-05: 20 mg via INTRAVENOUS
  Filled 2022-08-05: qty 2

## 2022-08-05 MED ORDER — ROSUVASTATIN CALCIUM 5 MG PO TABS
5.0000 mg | ORAL_TABLET | Freq: Every day | ORAL | Status: DC
  Administered 2022-08-06 – 2022-08-10 (×5): 5 mg via ORAL
  Filled 2022-08-05 (×5): qty 1

## 2022-08-05 MED ORDER — ROSUVASTATIN CALCIUM 20 MG PO TABS: 20.0000 mg | ORAL_TABLET | Freq: Every day | ORAL | Status: DC

## 2022-08-05 NOTE — Progress Notes (Signed)
     [address]       [place] [address]             [phone]       EVENING ROUNDS  POD #1 sp CABG Has been de lined Looks good in chair

## 2022-08-05 NOTE — Progress Notes (Addendum)
[address]       [place] [address]             [phone]       1 Day Post-Op Procedure(s) (LRB): CORONARY ARTERY BYPASS GRAFTING (CABG) X THREE USING LEFT INTERNAL MAMMARY ARTERY (LIMA) AND ENDOSCOPICALLY HARVESTED RIGHT GREATER SAPHENOUS VEIN. (N/A) TRANSESOPHAGEAL ECHOCARDIOGRAM (N/A) CLIPPING OF ATRIAL APPENDAGE USING AN ATRICURE 45 ATRICLIP (Left)  Subjective:  Patient states he is fine.  Appears uncomfortable, however very sleepy this morning.  Denies N/V  Objective: Vital signs in last 24 hours: Temp:  [94.3 F (34.6 C)-99.1 F (37.3 C)] 98.2 F (36.8 C) (03/06 0700) Pulse Rate:  [70-113] 86 (03/06 0700) Cardiac Rhythm: Atrial paced;Normal sinus rhythm (03/06 0400) Resp:  [8-30] 8 (03/06 0700) BP: (90-141)/(60-83) 91/60 (03/06 0700) SpO2:  [93 %-100 %] 96 % (03/06 0700) Arterial Line BP: (89-166)/(45-89) 107/49 (03/06 0700) FiO2 (%):  [40 %-50 %] 40 % (03/05 2338) Weight:  [67.6 kg] 67.6 kg (03/06 0500)  Hemodynamic parameters for last 24 hours: PAP: (13-37)/(5-17) 23/11 CO:  [2.6 L/min-6.7 L/min] 4.3 L/min CI:  [1.4 L/min/m2-3.6 L/min/m2] 2.3 L/min/m2  Intake/Output from previous day: 03/05 0701 - 03/06 0700 In: 5758.5 [I.V.:2631.7; Blood:490; NG/GT:280; IV Piggyback:2181.8] Out: [id number] [Urine:2712; Emesis/NG output:150; Drains:125; Blood:700; Chest Tube:620]  General appearance: cooperative, no distress, and appears sleepy, sedated Heart: regular rate and rhythm Lungs: diminished breath sounds on left Abdomen: soft, non-tender; bowel sounds normal; no masses,  no organomegaly Extremities: edema trace Wound: clean and dry, aquacel on sternum  Lab Results: Recent Labs    08/04/22 1952 08/04/22 2358 08/05/22 0115 08/05/22 0359  WBC 8.1  --   --  9.4  HGB 11.0*   < > 10.2* 10.6*  HCT 32.2*   < > 30.0* 32.3*  PLT 126*  --   --  141*   < > = values in this interval not displayed.   BMET:  Recent Labs    08/04/22 1952  08/04/22 2358 08/05/22 0115 08/05/22 0359  NA 137   < > 138 136  K 4.4   < > 4.0 4.2  CL 108  --   --  107  CO2 18*  --   --  22  GLUCOSE 143*  --   --  126*  BUN 10  --   --  11  CREATININE 0.83  --   --  0.85  CALCIUM 7.7*  --   --  8.0*   < > = values in this interval not displayed.    PT/INR:  Recent Labs    08/04/22 1356  LABPROT 18.1*  INR 1.5*   ABG    Component Value Date/Time   PHART 7.289 (L) 08/05/2022 0115   HCO3 19.1 (L) 08/05/2022 0115   TCO2 20 (L) 08/05/2022 0115   ACIDBASEDEF 7.0 (H) 08/05/2022 0115   O2SAT 99 08/05/2022 0115   CBG (last 3)  Recent Labs    08/05/22 0356 08/05/22 0456 08/05/22 0556  GLUCAP 125* 119* 140*    Assessment/Plan: S/P Procedure(s) (LRB): CORONARY ARTERY BYPASS GRAFTING (CABG) X THREE USING LEFT INTERNAL MAMMARY ARTERY (LIMA) AND ENDOSCOPICALLY HARVESTED RIGHT GREATER SAPHENOUS VEIN. (N/A) TRANSESOPHAGEAL ECHOCARDIOGRAM (N/A) CLIPPING OF ATRIAL APPENDAGE USING AN ATRICURE 45 ATRICLIP (Left)  CV- NSR, remains on Neo at 8 will wean as hemodynamics allow, hold off on BB until BP improves Pulm- extubated w/o issue overnight, CT output 620 cc since surgery, CXR shows left pleural  effusion/atelectasis.Marland Kitchen leave chest tubes in place today, needs IS Renal- creatinine stable, lytes are normal.. some edema on exam, will start Lasix once off Neo Expected post operative blood loss anemia, hgb stable at 10.6 Expected post operative thrombocytopenia- mild monitor Parkinson's Disease- home medications resumed CBGs controlled- patient is not a diabetic, will d/c insulin start SSIP Dispo- patient sleepy, POD #1 progression orders   LOS: 1 day    Erin Barrett, PA-C 08/05/2022   Gave 20 lasix this AM  Looks good hemdynamics  Hold metop until off neo for hours.   Happy w progress sofar

## 2022-08-05 NOTE — Discharge Summary (Signed)
[address]       [place] [address]             [phone]    Physician Discharge Summary  Patient ID: Terry [name] MRN: [id number] DOB/AGE: 74-Oct-1952 74 y.o.  Admit date: 08/04/2022 Discharge date: 08/07/2022  Admission Diagnoses:  Patient Active Problem List   Diagnosis Date Noted   Acute respiratory failure ([place]) 08/04/2022   CAD, multiple vessel 08/04/2022   Coronary artery disease 07/17/2022   Family history of heart disease 05/05/2022   Preoperative clearance 05/05/2022   Herniated nucleus pulposus, L2-3 right 03/24/2022   Excessive sleepiness 02/16/2022   Age-related cataract 09/29/2021   Hardening of the aorta (main artery of the heart) ([place]) 09/29/2021   Hematochezia 09/29/2021   Leukocytosis 09/29/2021   Long term (current) use of anticoagulants 09/29/2021   Personal history of pulmonary embolism 09/29/2021   Pleurodynia 09/29/2021   Pneumonia 09/29/2021   Venous thromboembolism 09/29/2021   Parkinsonism 09/29/2021   Mild cognitive impairment 09/29/2021   Pulmonary emboli ([place]) 08/13/2020   Pulmonary embolism ([place]) 08/12/2020   Elevated blood-pressure reading without diagnosis of hypertension 05/21/2020   Body mass index (BMI) 26.0-26.9, adult 01/05/2020   Essential (primary) hypertension 01/05/2020   Herniated nucleus pulposus of lumbosacral region 12/11/2019   Heteronymous bilateral field defects 08/25/2019   Degeneration of lumbar intervertebral disc 05/03/2019   Kidney stone 02/21/2019   Infection, skin 07/26/2018   Achilles tendon infection 07/26/2018   Wound dehiscence, surgical 07/10/2018    Class: Acute   Wound dehiscence, surgical, initial encounter 07/10/2018   Non-thrombocytopenic purpura ([place]) 02/10/2018   Rupture of right Achilles tendon 11/05/2017   Bitemporal hemianopia 06/17/2017   Achilles bursitis 02/09/2017   Spondylolisthesis of lumbar region 08/31/2016   Lumbar radiculopathy 02/28/2016   Disorder of  gallbladder 09/30/2015   Scrotal pain 07/11/2015   Spasm 07/11/2015   Personal history of colonic polyps 02/08/2015   Allergic urticaria 07/16/2014   Neck pain 06/27/2014   Prostate cancer ([place])    Elevated levels of transaminase & lactic acid dehydrogenase 01/23/2013   Encounter for screening for other disorder 01/23/2013   Hyperglycemia 01/23/2013   Melanocytic nevi, unspecified 01/23/2013   Backache 04/05/2012   Pain in right hand 04/05/2012   Acute stress reaction 12/29/2010   Allergic rhinitis 12/19/2009   Gastro-esophageal reflux disease without esophagitis 12/19/2009   Male erectile disorder 12/19/2009   Tear film insufficiency 12/19/2009   Carpal tunnel syndrome 09/03/2009   Visual disturbance 09/03/2009   Hyperlipidemia 09/03/2009   Osteoarthritis 09/03/2009   Discharge Diagnoses:  Patient Active Problem List   Diagnosis Date Noted   S/P CABG x 3 08/04/2022   Acute respiratory failure ([place]) 08/04/2022   CAD, multiple vessel 08/04/2022   Coronary artery disease 07/17/2022   Family history of heart disease 05/05/2022   Preoperative clearance 05/05/2022   Herniated nucleus pulposus, L2-3 right 03/24/2022   Excessive sleepiness 02/16/2022   Age-related cataract 09/29/2021   Hardening of the aorta (main artery of the heart) ([place]) 09/29/2021   Hematochezia 09/29/2021   Leukocytosis 09/29/2021   Long term (current) use of anticoagulants 09/29/2021   Personal history of pulmonary embolism 09/29/2021   Pleurodynia 09/29/2021   Pneumonia 09/29/2021   Venous thromboembolism 09/29/2021   Parkinsonism 09/29/2021   Mild cognitive impairment 09/29/2021   Pulmonary emboli ([place]) 08/13/2020   Pulmonary embolism ([place]) 08/12/2020   Elevated blood-pressure reading without diagnosis of hypertension 05/21/2020   Body mass  index (BMI) 26.0-26.9, adult 01/05/2020   Essential (primary) hypertension 01/05/2020   Herniated nucleus pulposus of lumbosacral region 12/11/2019    Heteronymous bilateral field defects 08/25/2019   Degeneration of lumbar intervertebral disc 05/03/2019   Kidney stone 02/21/2019   Infection, skin 07/26/2018   Achilles tendon infection 07/26/2018   Wound dehiscence, surgical 07/10/2018    Class: Acute   Wound dehiscence, surgical, initial encounter 07/10/2018   Non-thrombocytopenic purpura ([place]) 02/10/2018   Rupture of right Achilles tendon 11/05/2017   Bitemporal hemianopia 06/17/2017   Achilles bursitis 02/09/2017   Spondylolisthesis of lumbar region 08/31/2016   Lumbar radiculopathy 02/28/2016   Disorder of gallbladder 09/30/2015   Scrotal pain 07/11/2015   Spasm 07/11/2015   Personal history of colonic polyps 02/08/2015   Allergic urticaria 07/16/2014   Neck pain 06/27/2014   Prostate cancer ([place])    Elevated levels of transaminase & lactic acid dehydrogenase 01/23/2013   Encounter for screening for other disorder 01/23/2013   Hyperglycemia 01/23/2013   Melanocytic nevi, unspecified 01/23/2013   Backache 04/05/2012   Pain in right hand 04/05/2012   Acute stress reaction 12/29/2010   Allergic rhinitis 12/19/2009   Gastro-esophageal reflux disease without esophagitis 12/19/2009   Male erectile disorder 12/19/2009   Tear film insufficiency 12/19/2009   Carpal tunnel syndrome 09/03/2009   Visual disturbance 09/03/2009   Hyperlipidemia 09/03/2009   Osteoarthritis 09/03/2009   Discharged Condition: good  History of Present Illness:  Terry [name] is a 72 yo male with history of Hyperlipidemia, Hypercholesterolemia, Parkinson's Disease, H/O Pulmonary embolism and multiple back surgeries.  The patient is normally very active and an avid runner.  He noticed he began experiencing chest tightness which was exertional.  He was evaluated by Dr. Gwenlyn Found who obtained a CTA on 07/15/2022 which revealed and calcium score of 440 with significant disease of RCA.  Due to this finding outpatient cardiac catheterization was performed and showed  preserved EF and multivessel CAD.  It was felt coronary bypass grafting would be indicated and he was referred to [facility].  He was evaluated by Dr. Tenny Craw who felt the patient would be a candidate for coronary bypass grafting procedure.  The risks and benefits of the procedure were explained to the patient and he was agreeable to proceed.  Hospital Course:  Terry [name] presented to [facility] on 08/04/2022.  He was taken to the operating room and underwent CABG x 3 utilizing LIMA to LAD, SVG to Diagonal, and SVG to PDA.  He also underwent endoscopic harvest of greater saphenous vein from his right leg.  He tolerated the procedure without difficulty and was taken to the SICU in stable condition. The patient was extubated the evening of surgery.  He required Neo-synephrine for BP support which was weaned as hemodynamics allowed.  He was treated with IV Lasix for mildly volume overloaded state.  He was tachycardic and started on Lopressor.  His chest tubes and pacing wires were removed without difficulty.  PT/OT consult were obtained.  They recommended inpatient rehab.  The patient developed diarrhea and his bowel regimen was adjusted.  He remained in NSR and was stable for transfer to the progressive unit on 08/07/2022.    Consults: pulmonary/intensive care  Significant Diagnostic Studies: angiography:     Prox LAD to Mid LAD lesion is 95% stenosed.   Dist RCA lesion is 95% stenosed.   Prox RCA-1 lesion is 90% stenosed.   Prox RCA-2 lesion is 60% stenosed.   The  left ventricular systolic function is normal.   LV end diastolic pressure is low.   The left ventricular ejection fraction is 50-55% by visual estimate.   Terry [name] is a 72 y.o. male  Treatments:   OPERATIVE NOTE: Patient Name: Terry [name] Date of Birth: 1951-02-19 Date of Operation: 08/04/22   PRE-OPERATIVE DIAGNOSIS: Coronary artery disease' History of multiple pulmonary emboli    POST-OPERATIVE DIAGNOSIS: Same   OPERATION: CABG x 3 (LIMA - LAD, SVG - Diag, SVG - PDA) Endoscopic saphenous vein harvest Placement of 52m Atriclip on left atrial appendage   SURGEON: DPierre BaliEnter MD   ASSISTANT: EEllwood HandlerPA-C and BWynelle BeckmannPA-C  Discharge Exam: Blood pressure 109/74, pulse 88, temperature 97.6 F (36.4 C), temperature source Oral, resp. rate (!) 21, height '5\' 8"'$  (1.727 m), weight 75.9 kg, SpO2 95 %. {physical [id number]  Discharge Medications:  The patient has been discharged on:   1.Beta Blocker:  Yes [   ]                              No   [   ]                              If No, reason:  2.Ace Inhibitor/ARB: Yes [   ]                                     No  [    ]                                     If No, reason:  3.Statin:   Yes [   ]                  No  [   ]                  If No, reason:  4.Ecasa:  Yes  [   ]                  No   [   ]                  If No, reason:  Patient had ACS upon admission:  Plavix/P2Y12 inhibitor: Yes [   ]                                      No  [   ]      Allergies as of 08/07/2022       Reactions   Lipitor [atorvastatin] Other (See Comments)   Muscle issues   Penicillins Hives, Itching, Other (See Comments)   Has patient had a PCN reaction causing immediate rash, facial/tongue/throat swelling, SOB or lightheadedness with hypotension: No Has patient had a PCN reaction causing SEVERE RASH INVOLVING MUCUS MEMBRANES or SKIN NECROSIS: #  #  #  YES  #  #  #  Has patient had a PCN reaction that required hospitalization No Has patient had a PCN reaction occurring within the last 10 years: No  Med Rec must be completed prior to using this [facility]***       Follow-up Information     Lorretta Harp, MD Follow up on 08/12/2022.   Specialties: Cardiology, Radiology Why: Appointment is at 9:30 Contact information: [address] [place]  [address] [phone]         [facility] Follow up on 08/19/2022.   Specialty: Cardiothoracic Surgery Why: Appointment is at 2:00 Contact information: [place], [place] Follow up on 08/19/2022.   Why: Please get CXR at 1:00 prior to your appointment with Dr. Binnie Kand office Contact information: [place]                Signed:  [place] Handler, PA-C  08/07/2022, 9:38 AM

## 2022-08-05 NOTE — Consult Note (Signed)
NAME:  Terry [name], MRN:  [id number], DOB:  [birth year], LOS: 1 ADMISSION DATE:  08/04/2022, CONSULTATION DATE:  3/5 REFERRING MD:  Dr. Tenny Craw, CHIEF COMPLAINT:  CABG   History of Present Illness:  Patient is a 72 yo M w/ pertinent PMH CAD, HLD, previous PE 2021 on xarelto, chronic back pain presents to [facility] ED on 3/5 for CABG.  Patient has been complaining of exertional chest tightness and was referred to cardiology on 07/15/2022. Patient received CTA showing coronary calcium score of 440 w/ significant disease in his RCA. Received heart cath on 07/20/2022 showing 95% proximal LAD stenosis and 90% proximal RCA stenosis. Patient scheduled for CABG in 2 weeks. Will hold xarelto 2 days prior to procedure.  On 3/5 patient presents for elective CABG procedure. Post op intubated. PCCM consulted for medical management.  Pertinent  Medical History   Past Medical History:  Diagnosis Date   Arthritis    neck, back, knees, right ankle   Bladder calculus    Chronic back pain    Coronary artery disease    GERD (gastroesophageal reflux disease)    History of colon polyps    benign   History of kidney stones    and bladder stone   History of prostate cancer urologist-- dr Alinda Money   dx 2014---  s/p  prostatectomy 05-22-2013 ,  Gleason 3+3   Hypercholesterolemia    takes Crestor daily   Hyperlipidemia    Pneumonia 10/2019   Pulmonary emboli ([place]) 10/2019   Wears glasses    Significant Hospital Events: Including procedures, antibiotic start and stop dates in addition to other pertinent events   3/5 CABG x3  Interim History / Subjective:  Extubated at 10 h mark post-op. Complains of 6/10 pain with deep inspiration.   Objective   Blood pressure 91/60, pulse 86, temperature 98.2 F (36.8 C), resp. rate (!) 8, height '5\' 8"'$  (1.727 m), weight 67.6 kg, SpO2 96 %. PAP: (13-37)/(5-17) 23/11 CO:  [2.6 L/min-6.7 L/min] 4.3 L/min CI:  [1.4 L/min/m2-3.6 L/min/m2] 2.3 L/min/m2  Vent Mode: PSV;CPAP FiO2  (%):  [40 %-50 %] 40 % Set Rate:  [4 bmp-16 bmp] 4 bmp Vt Set:  [540 mL] 540 mL PEEP:  [5 cmH20] 5 cmH20 Pressure Support:  [10 cmH20] 10 cmH20   Intake/Output Summary (Last 24 hours) at 08/05/2022 0801 Last data filed at 08/05/2022 0700 Gross per 24 hour  Intake 5658.53 ml  Output 4307 ml  Net 1351.53 ml    Filed Weights   08/04/22 0612 08/05/22 0500  Weight: 73.9 kg 67.6 kg   Examination: General:  critically ill appearing on mech vent HEENT: MM pink/moist; ETT in place Neuro: sedated CV: s1s2, paced rhythm, no m/r/g PULM:  dim clear BS bilaterally; on mech vent SIMV; CT in place GI: soft, bsx4 active  Extremities: warm/dry, no edema  Skin: no rashes or lesions   Ancillary tests personally reviewed  CXR shows improved atelectasis at the right base. Small left sided effusion.  Creatinine 0.85 Assessment & Plan:   Post op vent management S/p 3-vessel CABG CAD HLD Prior PE Parkinson's disease, related dementia Mild thrombocytopenia Prediabetes  Plan: - Wean FiO2 for O2 sat > 90% - Multimodal pain control, prioritize oral narcotics and add gabapentin.  - IS and ambulation.  - ASA and statin - wean phenylephrine to keep MAP > 65 - Hold Xarelto for now.  - Continue Sinemet and Aricept - Transition to [place] insulin.   Best Practice (right click  and "Reselect all SmartList Selections" daily)   Diet/type: Advance diet.  DVT prophylaxis: SCD GI prophylaxis: H2B Lines: Central line and Arterial Line Foley:  Yes, and it is still needed Code Status:  full code Last date of multidisciplinary goals of care discussion [per primary]  Kipp Brood, MD [facility] ICU Physician [place]  Pager: [phone] Or Epic Secure Chat After hours: [phone].  08/05/2022, 8:12 AM

## 2022-08-05 NOTE — Procedures (Signed)
Extubation Procedure Note  Patient Details:   Name: Terry [name] DOB: 08-13-1950 MRN: [id number]   Airway Documentation:  Airway 8 mm (Active)  Secured at (cm) 25 cm 08/04/22 2338  Measured From Lips 08/04/22 2338  Secured Location Right 08/04/22 2338  Secured By Pink Tape 08/04/22 2338  Prone position No 08/04/22 2338  Cuff Pressure (cm H2O) Green OR 18-26 CmH2O 08/04/22 1944  Site Condition Dry 08/04/22 2338   Vent end date: (not recorded) Vent end time: (not recorded)   Evaluation  O2 sats: stable throughout Complications: No apparent complications Patient did tolerate procedure well. Bilateral Breath Sounds: Clear, Diminished   Yes  NIF: -40 VC: .8L positive cuff leak, no stridor noted. Pt able to say his name.  Pt extubated to 4L [place] per protocol. Pt tolerated well.    Clance Boll 08/05/2022, 12:15 AM

## 2022-08-06 ENCOUNTER — Telehealth: Payer: Self-pay | Admitting: Cardiovascular Disease

## 2022-08-06 ENCOUNTER — Inpatient Hospital Stay (HOSPITAL_COMMUNITY): Payer: Medicare Other

## 2022-08-06 DIAGNOSIS — J9600 Acute respiratory failure, unspecified whether with hypoxia or hypercapnia: Secondary | ICD-10-CM | POA: Diagnosis not present

## 2022-08-06 LAB — BASIC METABOLIC PANEL
Anion gap: 7 (ref 5–15)
BUN: 11 mg/dL (ref 8–23)
CO2: 23 mmol/L (ref 22–32)
Calcium: 8.1 mg/dL — ABNORMAL LOW (ref 8.9–10.3)
Chloride: 101 mmol/L (ref 98–111)
Creatinine, Ser: 0.77 mg/dL (ref 0.61–1.24)
GFR, Estimated: >60 mL/min (ref >60)
Glucose, Bld: 131 mg/dL — ABNORMAL HIGH (ref 70–99)
Potassium: 3.8 mmol/L (ref 3.5–5.1)
Sodium: 131 mmol/L — ABNORMAL LOW (ref 135–145)

## 2022-08-06 LAB — GLUCOSE, CAPILLARY
Glucose-Capillary: 116 mg/dL — ABNORMAL HIGH (ref 70–99)
Glucose-Capillary: 138 mg/dL — ABNORMAL HIGH (ref 70–99)
Glucose-Capillary: 134 mg/dL — ABNORMAL HIGH (ref 70–99)
Glucose-Capillary: 125 mg/dL — ABNORMAL HIGH (ref 70–99)
Glucose-Capillary: 122 mg/dL — ABNORMAL HIGH (ref 70–99)
Glucose-Capillary: 178 mg/dL — ABNORMAL HIGH (ref 70–99)

## 2022-08-06 LAB — CBC
HCT: 31.2 % — ABNORMAL LOW (ref 39.0–52.0)
Hemoglobin: 10.1 g/dL — ABNORMAL LOW (ref 13.0–17.0)
MCH: 28.5 pg (ref 26.0–34.0)
MCHC: 32.4 g/dL (ref 30.0–36.0)
MCV: 87.9 fL (ref 80.0–100.0)
Platelets: 142 10*3/uL — ABNORMAL LOW (ref 150–400)
RBC: 3.55 MIL/uL — ABNORMAL LOW (ref 4.22–5.81)
RDW: 14.9 % (ref 11.5–15.5)
WBC: 8.2 10*3/uL (ref 4.0–10.5)
nRBC: 0 % (ref 0.0–0.2)

## 2022-08-06 MED ORDER — METOPROLOL TARTRATE 12.5 MG HALF TABLET
12.5000 mg | ORAL_TABLET | Freq: Two times a day (BID) | ORAL | Status: DC
  Administered 2022-08-06 – 2022-08-10 (×9): 12.5 mg via ORAL
  Filled 2022-08-06 (×9): qty 1

## 2022-08-06 MED ORDER — METOPROLOL TARTRATE 12.5 MG HALF TABLET: 12.5000 mg | ORAL_TABLET | Freq: Two times a day (BID) | ORAL | Status: DC

## 2022-08-06 MED ORDER — FUROSEMIDE 20 MG PO TABS
20.0000 mg | ORAL_TABLET | Freq: Two times a day (BID) | ORAL | Status: DC
  Administered 2022-08-06 – 2022-08-09 (×7): 20 mg via ORAL
  Filled 2022-08-06 (×7): qty 1

## 2022-08-06 MED ORDER — POTASSIUM CHLORIDE CRYS ER 20 MEQ PO TBCR
20.0000 meq | EXTENDED_RELEASE_TABLET | Freq: Every day | ORAL | Status: DC
  Administered 2022-08-06 – 2022-08-07 (×2): 20 meq via ORAL
  Filled 2022-08-06 (×2): qty 1

## 2022-08-06 NOTE — Telephone Encounter (Signed)
Need to push appointment back? Or keep?   Thanks!

## 2022-08-06 NOTE — Evaluation (Signed)
Physical Therapy Evaluation Patient Details Name: Terry [name] MRN: [id number] DOB: Dec 23, 1950 Today's Date: 08/06/2022  History of Present Illness  Patient is a 72 y/o male admitted 08/03/21 following CABG x 3.  PMH positive for prostate CA, HLD, hay fever, GERD, chronic back pain, asthma, R achilles tendon repair, B knee arthroscopy,R finger surgery, cardiac cath 2004, ACDF 2016    Patient is a 72 yo male presenting to hospital for L2-L3 fusion on 03/24/22. PMH includes: L3-L5 fusion, Parkinsons, prostate CA, HLD, hay fever, GERD, chronic back pain, asthma, R achilles tendon repair.  Clinical Impression  Patient presents with decreased mobility due to deficits listed in PT problem list.  Currently max A for up to EOB and sit to stand and able to ambulate with Harmon Pier walker and mod A +2 for chair follow to hallway.  Previously ambulatory without device and wife supportive.  Feel he would benefit from acute inpatient rehab prior to d/c home.  PT will continue to follow.        Recommendations for follow up therapy are one component of a multi-disciplinary discharge planning process, led by the attending physician.  Recommendations may be updated based on patient status, additional functional criteria and insurance authorization.  Follow Up Recommendations Acute inpatient rehab (3hours/day)      Assistance Recommended at Discharge Frequent or constant Supervision/Assistance  Patient can return home with the following  Two people to help with walking and/or transfers;Two people to help with bathing/dressing/bathroom;Assistance with cooking/housework;Assist for transportation;Help with stairs or ramp for entrance    Equipment Recommendations Other (comment) (TBA)  Recommendations for Other Services       Functional Status Assessment Patient has had a recent decline in their functional status and demonstrates the ability to make significant improvements in function in a reasonable and predictable  amount of time.     Precautions / Restrictions Precautions Precautions: Sternal;Fall      Mobility  Bed Mobility Overal bed mobility: Needs Assistance Bed Mobility: Rolling, Sidelying to Sit Rolling: Max assist Sidelying to sit: Max assist       General bed mobility comments: up with assist for trunk and legs pt cued throughout to keep holding heart pillow to chest    Transfers Overall transfer level: Needs assistance Equipment used: None Transfers: Sit to/from Stand Sit to Stand: Max assist           General transfer comment: assist with pad under hips to lift to stand with knees blocked, pt then reaching for hand hold and placed Eva walker in front with assist for arms up on platform    Ambulation/Gait Ambulation/Gait assistance: Mod assist, +2 safety/equipment Gait Distance (Feet): 12 Feet (&20') Assistive device: Ethelene Hal Gait Pattern/deviations: Step-to pattern, Step-through pattern, Wide base of support, Trunk flexed, Decreased dorsiflexion - right, Decreased dorsiflexion - left, Decreased stride length       General Gait Details: assist to guide walker and for forward momentum with very slight foot clearance; sat in chair in room and then stood again with RN pushing recliner behind  Stairs            Wheelchair Mobility    Modified Rankin (Stroke Patients Only)       Balance Overall balance assessment: Needs assistance Sitting-balance support: Feet supported, Feet unsupported Sitting balance-Leahy Scale: Poor Sitting balance - Comments: falling back and to R with multiple cues and facilitation for anterior and L lateral weight shift Postural control: Right lateral lean, Posterior lean Standing balance support:  Bilateral upper extremity supported Standing balance-Leahy Scale: Poor Standing balance comment: UE support and assist for balance in standing                             Pertinent Vitals/Pain Pain Assessment Pain  Assessment: Faces Faces Pain Scale: Hurts little more Pain Location: chest with mobility Pain Descriptors / Indicators: Grimacing Pain Intervention(s): Monitored during session, Limited activity within patient's tolerance    Home Living Family/patient expects to be discharged to:: Private residence Living Arrangements: Spouse/significant other Available Help at Discharge: Family;Friend(s);Available 24 hours/day Type of Home: House Home Access: Stairs to enter   [employer] of Steps: threshold   Home Layout: Two level;Able to live on main level with bedroom/bathroom Home Equipment: Rolling Walker (2 wheels);Shower seat - built in      Prior Function               Mobility Comments: patient independent with mobility per report       Hand Dominance        Extremity/Trunk Assessment   Upper Extremity Assessment Upper Extremity Assessment: RUE deficits/detail;LUE deficits/detail RUE Deficits / Details: AROM WFL with sternal precautions; strength grossly 3+/5 RUE Coordination: decreased fine motor LUE Deficits / Details: AROM WFL with sternal precautions; strength grossly 3+/5 LUE Coordination: decreased fine motor    Lower Extremity Assessment Lower Extremity Assessment: LLE deficits/detail;RLE deficits/detail RLE Deficits / Details: AAROM noted stiffness/rigidity throughout, able to lift antigravity, but limited knee flexion RLE Coordination: decreased gross motor LLE Deficits / Details: AAROM noted stiffness/rigidity throughout, able to lift antigravity, but limited knee flexion LLE Coordination: decreased gross motor    Cervical / Trunk Assessment Cervical / Trunk Assessment: Other exceptions Cervical / Trunk Exceptions: stiffness/rigidity  Communication   Communication: No difficulties  Cognition Arousal/Alertness: Lethargic, Suspect due to medications Behavior During Therapy: Flat affect Overall Cognitive Status: Impaired/Different from  baseline Area of Impairment: Attention, Following commands, Safety/judgement, Problem solving                   Current Attention Level: Focused   Following Commands: Follows one step commands inconsistently, Follows one step commands with increased time Safety/Judgement: Decreased awareness of safety, Decreased awareness of deficits   Problem Solving: Slow processing, Difficulty sequencing, Decreased initiation, Requires verbal cues General Comments: had pre-med prior to chest tube out        General Comments General comments (skin integrity, edema, etc.): on 2L O2 throughout with VSS; needing encouragement to continue to try as attempting to sit after initial standing saying he could not stand.  spoke with wife via phone to get home set up    Exercises     Assessment/Plan    PT Assessment Patient needs continued PT services  PT Problem List Decreased strength;Decreased balance;Decreased cognition;Decreased mobility;Decreased activity tolerance;Decreased safety awareness;Decreased coordination;Decreased knowledge of use of DME       PT Treatment Interventions DME instruction;Functional mobility training;Balance training;Patient/family education;Therapeutic activities;Gait training;Therapeutic exercise;Cognitive remediation    PT Goals (Current goals can be found in the Care Plan section)  Acute Rehab PT Goals Patient Stated Goal: to return to independent PT Goal Formulation: With patient/family Time For Goal Achievement: 08/20/22 Potential to Achieve Goals: Good    Frequency Min 3X/week     Co-evaluation               AM-PAC PT "6 Clicks" Mobility  Outcome Measure Help needed turning from your back  to your side while in a flat bed without using bedrails?: Total Help needed moving from lying on your back to sitting on the side of a flat bed without using bedrails?: Total Help needed moving to and from a bed to a chair (including a wheelchair)?: A Lot Help  needed standing up from a chair using your arms (e.g., wheelchair or bedside chair)?: Total Help needed to walk in hospital room?: A Lot Help needed climbing 3-5 steps with a railing? : Total 6 Click Score: 8    End of Session Equipment Utilized During Treatment: Gait belt;Oxygen Activity Tolerance: Patient limited by fatigue Patient left: in chair;with call bell/phone within reach Nurse Communication: Mobility status PT Visit Diagnosis: Other abnormalities of gait and mobility (R26.89);Muscle weakness (generalized) (M62.81)    Time: [id number] PT Time Calculation (min) (ACUTE ONLY): 39 min   Charges:   PT Evaluation $PT Eval Moderate Complexity: 1 Mod PT Treatments $Gait Training: 8-22 mins $Therapeutic Activity: 8-22 mins        Magda Kiel, PT Acute Rehabilitation Services [phone] 08/06/2022   Reginia Naas 08/06/2022, 5:17 PM

## 2022-08-06 NOTE — Telephone Encounter (Signed)
Patient's wife is calling about his appt his has on 3/13.  She states he just had triple bypass surgery on 3/5 and is going to be released from the hospital on 3/11.  She wants to know if he needs to come in for his appt on 3/13.

## 2022-08-06 NOTE — Progress Notes (Addendum)
[address]       [place] [address]             [phone]      2 Days Post-Op Procedure(s) (LRB): CORONARY ARTERY BYPASS GRAFTING (CABG) X THREE USING LEFT INTERNAL MAMMARY ARTERY (LIMA) AND ENDOSCOPICALLY HARVESTED RIGHT GREATER SAPHENOUS VEIN. (N/A) TRANSESOPHAGEAL ECHOCARDIOGRAM (N/A) CLIPPING OF ATRIAL APPENDAGE USING AN ATRICURE 45 ATRICLIP (Left)  Subjective:  Patient sitting up in chair.  States bottom is sore.   Denies N/V.  Objective: Vital signs in last 24 hours: Temp:  [98 F (36.7 C)-98.8 F (37.1 C)] 98 F (36.7 C) (03/06 2000) Pulse Rate:  [82-100] 95 (03/07 0700) Cardiac Rhythm: Normal sinus rhythm (03/07 0000) Resp:  [9-31] 31 (03/07 0700) BP: (92-137)/(60-78) 107/73 (03/07 0700) SpO2:  [88 %-99 %] 94 % (03/07 0700) Arterial Line BP: (104-168)/(46-84) 117/53 (03/06 1600) Weight:  [78.2 kg] 78.2 kg (03/07 0500)  Hemodynamic parameters for last 24 hours: PAP: (24-31)/(11-13) 31/13  Intake/Output from previous day: 03/06 0701 - 03/07 0700 In: 1201.9 [P.O.:580; I.V.:472; IV Piggyback:149.9] Out: 1630 [Urine:1385; Drains:15; Chest Tube:230]  General appearance: alert, cooperative, and no distress Heart: regular rate and rhythm and tachy Lungs: diminished breath sounds bibasilar Abdomen: soft, non-tender; bowel sounds normal; no masses,  no organomegaly Extremities: edema trace Wound: clean and dry, aquacel on sternotomy  Lab Results: Recent Labs    08/05/22 1613 08/06/22 0447  WBC 9.0 8.2  HGB 10.8* 10.1*  HCT 32.3* 31.2*  PLT 145* 142*   BMET:  Recent Labs    08/05/22 1613 08/06/22 0447  NA 134* 131*  K 3.9 3.8  CL 103 101  CO2 22 23  GLUCOSE 127* 131*  BUN 11 11  CREATININE 0.87 0.77  CALCIUM 8.3* 8.1*    PT/INR:  Recent Labs    08/04/22 1356  LABPROT 18.1*  INR 1.5*   ABG    Component Value Date/Time   PHART 7.289 (L) 08/05/2022 0115   HCO3 19.1 (L) 08/05/2022 0115   TCO2 20 (L) 08/05/2022 0115    ACIDBASEDEF 7.0 (H) 08/05/2022 0115   O2SAT 99 08/05/2022 0115   CBG (last 3)  Recent Labs    08/05/22 2016 08/05/22 2354 08/06/22 0445  GLUCAP 151* 140* 116*    Assessment/Plan: S/P Procedure(s) (LRB): CORONARY ARTERY BYPASS GRAFTING (CABG) X THREE USING LEFT INTERNAL MAMMARY ARTERY (LIMA) AND ENDOSCOPICALLY HARVESTED RIGHT GREATER SAPHENOUS VEIN. (N/A) TRANSESOPHAGEAL ECHOCARDIOGRAM (N/A) CLIPPING OF ATRIAL APPENDAGE USING AN ATRICURE 45 ATRICLIP (Left)  CV- Sinus Tachycardia, BP is stable- will start low dose BB today.. ? Remove EPW today? Pulm- CT with 230 cc output, suspect will remove today will discuss with Dr. Tenny Craw Renal- creatinine stable, weight is inaccurately recorded, some edema on exam.. will discuss Lasix regimen with Dr. Tenny Craw Hyponatremia- likely due to IV lasix yesterday 5.  Expected post operative blood loss anemia, Hgb at 10.1 stable 6. Expected post operative thrombocytopenia- stable 7. Deconditioning- will get PT/OT consult    LOS: 2 days    Ellwood Handler, PA-C 08/06/2022  TCTS Progress Note: 2 Days Post-Op Procedure(s) (LRB): CORONARY ARTERY BYPASS GRAFTING (CABG) X THREE USING LEFT INTERNAL MAMMARY ARTERY (LIMA) AND ENDOSCOPICALLY HARVESTED RIGHT GREATER SAPHENOUS VEIN. (N/A) TRANSESOPHAGEAL ECHOCARDIOGRAM (N/A) CLIPPING OF ATRIAL APPENDAGE USING AN ATRICURE 45 ATRICLIP (Left)  LOS: 2 days      Latest Ref Rng & Units 08/06/2022    4:47 AM 08/05/2022    4:13 PM 08/05/2022  3:59 AM  CBC  WBC 4.0 - 10.5 K/uL 8.2  9.0  9.4   Hemoglobin 13.0 - 17.0 g/dL 10.1  10.8  10.6   Hematocrit 39.0 - 52.0 % 31.2  32.3  32.3   Platelets 150 - 400 K/uL 142  145  141        Latest Ref Rng & Units 08/06/2022    4:47 AM 08/05/2022    4:13 PM 08/05/2022    3:59 AM  CMP  Glucose 70 - 99 mg/dL 131  127  126   BUN 8 - 23 mg/dL '11  11  11   '$ Creatinine 0.61 - 1.24 mg/dL 0.77  0.87  0.85   Sodium 135 - 145 mmol/L 131  134  136   Potassium 3.5 - 5.1 mmol/L 3.8  3.9   4.2   Chloride 98 - 111 mmol/L 101  103  107   CO2 22 - 32 mmol/L '23  22  22   '$ Calcium 8.9 - 10.3 mg/dL 8.1  8.3  8.0     ABG    Component Value Date/Time   PHART 7.289 (L) 08/05/2022 0115   PCO2ART 39.7 08/05/2022 0115   PO2ART 127 (H) 08/05/2022 0115   HCO3 19.1 (L) 08/05/2022 0115   TCO2 20 (L) 08/05/2022 0115   ACIDBASEDEF 7.0 (H) 08/05/2022 0115   O2SAT 99 08/05/2022 0115        DR Thalia Turkington ADDENDUM  Looks great 2 days after open heart. Sitting up and conversational  PT/OT METOP 12.5 BID DC WIRES 1 HR LATER DC CHEST TUBES AFTER THAT DC CENTRAL LINE 20 LASIX BID DC FOLEY 11PM CONSIDER THIS PM IF FLOOR TRANSFER APPROPRIATE

## 2022-08-06 NOTE — Consult Note (Signed)
NAME:  GLENDA MCLOONE, MRN:  [id number], DOB:  05-09-1951, LOS: 2 ADMISSION DATE:  08/04/2022, CONSULTATION DATE:  3/5 REFERRING MD:  Dr. Tenny Craw, CHIEF COMPLAINT:  CABG   History of Present Illness:  Patient is a 72 yo M w/ pertinent PMH CAD, HLD, previous PE 2021 on xarelto, chronic back pain presents to [facility] ED on 3/5 for CABG.  Patient has been complaining of exertional chest tightness and was referred to cardiology on 07/15/2022. Patient received CTA showing coronary calcium score of 440 w/ significant disease in his RCA. Received heart cath on 07/20/2022 showing 95% proximal LAD stenosis and 90% proximal RCA stenosis. Patient scheduled for CABG in 2 weeks. Will hold xarelto 2 days prior to procedure.  On 3/5 patient presents for elective CABG procedure. Post op intubated. PCCM consulted for medical management.  Pertinent  Medical History   Past Medical History:  Diagnosis Date   Arthritis    neck, back, knees, right ankle   Bladder calculus    Chronic back pain    Coronary artery disease    GERD (gastroesophageal reflux disease)    History of colon polyps    benign   History of kidney stones    and bladder stone   History of prostate cancer urologist-- dr Alinda Money   dx 2014---  s/p  prostatectomy 05-22-2013 ,  Gleason 3+3   Hypercholesterolemia    takes Crestor daily   Hyperlipidemia    Pneumonia 10/2019   Pulmonary emboli ([place]) 10/2019   Wears glasses    Significant Hospital Events: Including procedures, antibiotic start and stop dates in addition to other pertinent events   3/5 CABG x3 - fast-track extubation.   Interim History / Subjective:  Extubated at 10 h mark post-op. Complains of 6/10 pain with deep inspiration.   Objective   Blood pressure 107/73, pulse 95, temperature 98.5 F (36.9 C), temperature source Oral, resp. rate (!) 31, height '5\' 8"'$  (1.727 m), weight 78.2 kg, SpO2 94 %.        Intake/Output Summary (Last 24 hours) at 08/06/2022 0912 Last data filed at  08/06/2022 0600 Gross per 24 hour  Intake 1127.94 ml  Output 1470 ml  Net -342.06 ml    Filed Weights   08/04/22 0612 08/05/22 0500 08/06/22 0500  Weight: 73.9 kg 67.6 kg 78.2 kg   Examination: General:  critically ill appearing on mech vent HEENT: MM pink/moist; ETT in place Neuro: sedated CV: s1s2, paced rhythm, no m/r/g PULM:  dim clear BS bilaterally; on mech vent SIMV; CT in place GI: soft, bsx4 active  Extremities: warm/dry, no edema  Skin: no rashes or lesions   Ancillary tests personally reviewed  Na: 131 PLT: 142  Assessment & Plan:   Post op vent management - now extubated.  S/p 3-vessel CABG CAD HLD Prior PE Parkinson's disease, related dementia Mild thrombocytopenia Prediabetes  Plan: - Wean FiO2 for O2 sat > 90% - Multimodal pain control, prioritize oral narcotics and add gabapentin.  - IS and ambulation.  - ASA and statin - wean phenylephrine to keep MAP > 65 - Hold Xarelto for now.  - Continue Sinemet and Aricept - Well controlled BG on current regimen.    Ready for transfer.   Best Practice (right click and "Reselect all SmartList Selections" daily)   Diet/type: Advance diet.  DVT prophylaxis: SCD GI prophylaxis: H2B Lines: Central line and Arterial Line  - removed.  Foley:  Yes, and it is still needed - removed Code  Status:  full code Last date of multidisciplinary goals of care discussion [per primary]  Kipp Brood, MD [facility] ICU Physician [place]  Pager: [phone] Or Epic Secure Chat After hours: [phone].  08/06/2022, 9:12 AM

## 2022-08-07 LAB — GLUCOSE, CAPILLARY
Glucose-Capillary: 102 mg/dL — ABNORMAL HIGH (ref 70–99)
Glucose-Capillary: 134 mg/dL — ABNORMAL HIGH (ref 70–99)
Glucose-Capillary: 112 mg/dL — ABNORMAL HIGH (ref 70–99)

## 2022-08-07 MED ORDER — ACETAMINOPHEN 325 MG PO TABS
650.0000 mg | ORAL_TABLET | Freq: Four times a day (QID) | ORAL | Status: DC | PRN
  Administered 2022-08-10: 650 mg via ORAL
  Filled 2022-08-07: qty 2

## 2022-08-07 MED ORDER — DOCUSATE SODIUM 100 MG PO CAPS: 200.0000 mg | ORAL_CAPSULE | Freq: Two times a day (BID) | ORAL | Status: DC | PRN

## 2022-08-07 MED ORDER — BISACODYL 10 MG RE SUPP: 10.0000 mg | Freq: Every day | RECTAL | Status: DC | PRN

## 2022-08-07 MED ORDER — BISACODYL 5 MG PO TBEC: 10.0000 mg | DELAYED_RELEASE_TABLET | Freq: Every day | ORAL | Status: DC | PRN

## 2022-08-07 MED FILL — Electrolyte-R (PH 7.4) Solution: INTRAVENOUS | Qty: 4000 | Status: AC

## 2022-08-07 MED FILL — Heparin Sodium (Porcine) Inj 1000 Unit/ML: INTRAMUSCULAR | Qty: 10 | Status: AC

## 2022-08-07 MED FILL — Sodium Bicarbonate IV Soln 8.4%: INTRAVENOUS | Qty: 50 | Status: AC

## 2022-08-07 MED FILL — Mannitol IV Soln 20%: INTRAVENOUS | Qty: 500 | Status: AC

## 2022-08-07 MED FILL — Sodium Chloride IV Soln 0.9%: INTRAVENOUS | Qty: 2000 | Status: AC

## 2022-08-07 NOTE — Evaluation (Signed)
Occupational Therapy Evaluation Patient Details Name: Terry [name] MRN: [id number] DOB: 06-02-1950 Today's Date: 08/07/2022   History of Present Illness Patient is a 72 y/o male admitted 08/03/21 following CABG x 3.  PMH positive for prostate CA, HLD, hay fever, GERD, chronic back pain, asthma, R achilles tendon repair, B knee arthroscopy,R finger surgery, cardiac cath 2004, ACDF 2016    Patient is a 72 yo male presenting to hospital for L2-L3 fusion on 03/24/22. PMH includes: L3-L5 fusion, Parkinsons, prostate CA, HLD, hay fever, GERD, chronic back pain, asthma, R achilles tendon repair.   Clinical Impression   This 72 yo male admitted and underwent above presents to acute OT with PLOF of being independent to Mod I with all basic ADLs. He currently is setup/S-mod A for basic ADLs and min A+2 for sit<>stand and ambulation with RW. His cognition is overall better this AM with being A & O x4 as well as being able to state his sternal precautions. He will continue to benefit from acute OT with follow up on AIR.       Recommendations for follow up therapy are one component of a multi-disciplinary discharge planning process, led by the attending physician.  Recommendations may be updated based on patient status, additional functional criteria and insurance authorization.   Follow Up Recommendations  Acute inpatient rehab (3hours/day)        Patient can return home with the following Two people to help with walking and/or transfers;A lot of help with bathing/dressing/bathroom;Help with stairs or ramp for entrance;Assist for transportation;Direct supervision/assist for financial management;Direct supervision/assist for medications management    Functional Status Assessment  Patient has had a recent decline in their functional status and demonstrates the ability to make significant improvements in function in a reasonable and predictable amount of time.  Equipment Recommendations  Other (comment)  (TBD next venue)    Recommendations for Other Services Rehab consult     Precautions / Restrictions Precautions Precautions: Sternal;Fall Restrictions Weight Bearing Restrictions: Yes RUE Weight Bearing: Non weight bearing LUE Weight Bearing: Non weight bearing      Mobility Bed Mobility               General bed mobility comments: pt up in recliner upon room entry    Transfers Overall transfer level: Needs assistance Equipment used: Rolling walker (2 wheels) Transfers: Sit to/from Stand Sit to Stand: Min assist, +2 physical assistance           General transfer comment: holding onto heart pillow and momentum to get up      Balance Overall balance assessment: Needs assistance Sitting-balance support: No upper extremity supported, Feet supported Sitting balance-Leahy Scale: Fair     Standing balance support: Bilateral upper extremity supported Standing balance-Leahy Scale: Poor                             ADL either performed or assessed with clinical judgement   ADL Overall ADL's : Needs assistance/impaired Eating/Feeding: Independent;Sitting Eating/Feeding Details (indicate cue type and reason): in recliner Grooming: Supervision/safety;Set up;Sitting Grooming Details (indicate cue type and reason): in recliner Upper Body Bathing: Set up;Supervision/ safety;Sitting Upper Body Bathing Details (indicate cue type and reason): in recliner Lower Body Bathing: Minimal assistance;Sit to/from stand Lower Body Bathing Details (indicate cue type and reason): min A +2 sit<>stand Upper Body Dressing : Set up;Supervision/safety;Sitting Upper Body Dressing Details (indicate cue type and reason): in recliner Lower Body Dressing: Moderate  assistance Lower Body Dressing Details (indicate cue type and reason): min A +2 sit<>stand Toilet Transfer: Minimal assistance;+2 for physical assistance;+2 for safety/equipment;Ambulation;Rolling walker (2 wheels)    Toileting- Clothing Manipulation and Hygiene: Total assistance Toileting - Clothing Manipulation Details (indicate cue type and reason): min A +2 sit<>stand             Vision Baseline Vision/History: 1 Wears glasses Patient Visual Report: No change from baseline              Pertinent Vitals/Pain Pain Assessment Pain Assessment: No/denies pain     Hand Dominance Right   Extremity/Trunk Assessment Upper Extremity Assessment Upper Extremity Assessment: Overall WFL for tasks assessed           Communication Communication Communication: No difficulties   Cognition Arousal/Alertness: Awake/alert Behavior During Therapy: Flat affect   Area of Impairment: Attention, Following commands, Safety/judgement, Memory, Awareness, Problem solving                   Current Attention Level: Sustained Memory: Decreased short-term memory Following Commands: Follows one step commands with increased time Safety/Judgement: Decreased awareness of safety, Decreased awareness of deficits Awareness: Emergent Problem Solving: Difficulty sequencing, Requires verbal cues, Requires tactile cues                  Home Living Family/patient expects to be discharged to:: Private residence Living Arrangements: Spouse/significant other   Type of Home: House Home Access: Stairs to enter [employer] of Steps: threshold   Home Layout: Two level;Able to live on main level with bedroom/bathroom     Bathroom Shower/Tub: Hospital doctor Toilet: Handicapped height     Home Equipment: Conservation officer, nature (2 wheels);Shower seat - built in          Prior Functioning/Environment Prior Level of Function : Needs assist             Mobility Comments: patient independent with mobility per report ADLs Comments: patient independent per patient report        OT Problem List: Decreased strength;Impaired balance (sitting and/or standing);Decreased  cognition;Decreased safety awareness;Decreased knowledge of precautions      OT Treatment/Interventions: Self-care/ADL training;DME and/or AE instruction;Patient/family education;Balance training    OT Goals(Current goals can be found in the care plan section) Acute Rehab OT Goals Patient Stated Goal: to get out of here tomorrow OT Goal Formulation: With patient Time For Goal Achievement: 08/21/22 Potential to Achieve Goals: Good  OT Frequency: Min 2X/week       AM-PAC OT "6 Clicks" Daily Activity     Outcome Measure Help from another person eating meals?: None Help from another person taking care of personal grooming?: A Little Help from another person toileting, which includes using toliet, bedpan, or urinal?: A Lot Help from another person bathing (including washing, rinsing, drying)?: A Little Help from another person to put on and taking off regular upper body clothing?: A Little Help from another person to put on and taking off regular lower body clothing?: A Lot 6 Click Score: 17   End of Session Equipment Utilized During Treatment: Gait belt;Rolling walker (2 wheels) Nurse Communication:  Investment banker, corporate and I worked as a Therapist, occupational for transfers and mobility)  Activity Tolerance: Patient tolerated treatment well Patient left: in chair;with call bell/phone within reach  OT Visit Diagnosis: Unsteadiness on feet (R26.81);Other abnormalities of gait and mobility (R26.89);Muscle weakness (generalized) (M62.81);Other symptoms and signs involving cognitive function  [id number] OT Time Calculation (min): 32 min Charges:  OT General Charges $OT Visit: 1 Visit OT Evaluation $OT Eval Moderate Complexity: 1 Mod OT Treatments $Self Care/Home Management : 8-22 mins  [place] Office [phone]    Almon Register 08/07/2022, 9:00 AM

## 2022-08-07 NOTE — Progress Notes (Signed)
[facility] Progress Note: 3 Days Post-Op Procedure(s) (LRB): CORONARY ARTERY BYPASS GRAFTING (CABG) X THREE USING LEFT INTERNAL MAMMARY ARTERY ([place]) AND ENDOSCOPICALLY HARVESTED RIGHT GREATER SAPHENOUS VEIN. (N/A) TRANSESOPHAGEAL ECHOCARDIOGRAM (N/A) CLIPPING OF ATRIAL APPENDAGE USING AN ATRICURE 45 ATRICLIP (Left)  LOS: 3 days   EB not for full details Doing well Mildly delerious  All ct/wires/foley out No labs today Dc most pain meds and do tylenol and oxy only On 20 lasix BID.  6bms overnight - decrease bowel regimen.  Bowel regimen to prn Consult to CIR - recd by pt/ot     Latest Ref Rng & Units 08/06/2022    4:47 AM 08/05/2022    4:13 PM 08/05/2022    3:59 AM  CBC  WBC 4.0 - 10.5 K/uL 8.2  9.0  9.4   Hemoglobin 13.0 - 17.0 g/dL 10.1  10.8  10.6   Hematocrit 39.0 - 52.0 % 31.2  32.3  32.3   Platelets 150 - 400 K/uL 142  145  141        Latest Ref Rng & Units 08/06/2022    4:47 AM 08/05/2022    4:13 PM 08/05/2022    3:59 AM  CMP  Glucose 70 - 99 mg/dL 131  127  126   BUN 8 - 23 mg/dL '11  11  11   '$ Creatinine 0.61 - 1.24 mg/dL 0.77  0.87  0.85   Sodium 135 - 145 mmol/L 131  134  136   Potassium 3.5 - 5.1 mmol/L 3.8  3.9  4.2   Chloride 98 - 111 mmol/L 101  103  107   CO2 22 - 32 mmol/L '23  22  22   '$ Calcium 8.9 - 10.3 mg/dL 8.1  8.3  8.0     ABG    Component Value Date/Time   PHART 7.289 (L) 08/05/2022 0115   PCO2ART 39.7 08/05/2022 0115   PO2ART 127 (H) 08/05/2022 0115   HCO3 19.1 (L) 08/05/2022 0115   TCO2 20 (L) 08/05/2022 0115   ACIDBASEDEF 7.0 (H) 08/05/2022 0115   O2SAT 99 08/05/2022 0115

## 2022-08-07 NOTE — Progress Notes (Signed)
      [address]       [place] [address]             [phone]      Eating dinner  BP (!) 82/56   Pulse 85   Temp 98.4 F (36.9 C)   Resp 20   Ht 5\' 8"  (1.727 m)   Wt 75.9 kg   SpO2 95%   BMI 25.44 kg/m   Intake/Output Summary (Last 24 hours) at 08/07/2022 1656 Last data filed at 08/07/2022 1100 Gross per 24 hour  Intake 1071 ml  Output 2450 ml  Net -1379 ml   Awaiting bed on 4E  Tijuan Dantes C. Roxan Hockey, MD [facility] [phone]

## 2022-08-07 NOTE — Telephone Encounter (Signed)
Spoke with wife who went ahead and changed appointment already and was scheduled to see Raquel Sarna on 3/25. She verbalizes understanding.

## 2022-08-07 NOTE — Plan of Care (Signed)
  Problem: Education: Goal: Understanding of CV disease, CV risk reduction, and recovery process will improve Outcome: Progressing Goal: Individualized Educational Video(s) Outcome: Progressing   Problem: Activity: Goal: Ability to return to baseline activity level will improve Outcome: Progressing   Problem: Cardiovascular: Goal: Ability to achieve and maintain adequate cardiovascular perfusion will improve Outcome: Progressing Goal: Vascular access site(s) Level 0-1 will be maintained Outcome: Progressing   Problem: Health Behavior/Discharge Planning: Goal: Ability to safely manage health-related needs after discharge will improve Outcome: Progressing   Problem: Education: Goal: Knowledge of General Education information will improve Description: Including pain rating scale, medication(s)/side effects and non-pharmacologic comfort measures Outcome: Progressing   Problem: Health Behavior/Discharge Planning: Goal: Ability to manage health-related needs will improve Outcome: Progressing   Problem: Clinical Measurements: Goal: Ability to maintain clinical measurements within normal limits will improve Outcome: Progressing Goal: Will remain free from infection Outcome: Progressing Goal: Diagnostic test results will improve Outcome: Progressing Goal: Respiratory complications will improve Outcome: Progressing Goal: Cardiovascular complication will be avoided Outcome: Progressing   Problem: Activity: Goal: Risk for activity intolerance will decrease Outcome: Progressing   Problem: Nutrition: Goal: Adequate nutrition will be maintained Outcome: Progressing   Problem: Coping: Goal: Level of anxiety will decrease Outcome: Progressing   Problem: Elimination: Goal: Will not experience complications related to bowel motility Outcome: Progressing Goal: Will not experience complications related to urinary retention Outcome: Progressing   Problem: Pain Managment: Goal:  General experience of comfort will improve Outcome: Progressing   Problem: Safety: Goal: Ability to remain free from injury will improve Outcome: Progressing   Problem: Skin Integrity: Goal: Risk for impaired skin integrity will decrease Outcome: Progressing   Problem: Education: Goal: Will demonstrate proper wound care and an understanding of methods to prevent future damage Outcome: Progressing Goal: Knowledge of disease or condition will improve Outcome: Progressing Goal: Knowledge of the prescribed therapeutic regimen will improve Outcome: Progressing Goal: Individualized Educational Video(s) Outcome: Progressing   Problem: Activity: Goal: Risk for activity intolerance will decrease Outcome: Progressing   Problem: Cardiac: Goal: Will achieve and/or maintain hemodynamic stability Outcome: Progressing   Problem: Clinical Measurements: Goal: Postoperative complications will be avoided or minimized Outcome: Progressing   Problem: Respiratory: Goal: Respiratory status will improve Outcome: Progressing   Problem: Skin Integrity: Goal: Wound healing without signs and symptoms of infection Outcome: Progressing Goal: Risk for impaired skin integrity will decrease Outcome: Progressing   Problem: Urinary Elimination: Goal: Ability to achieve and maintain adequate renal perfusion and functioning will improve Outcome: Progressing   Problem: Activity: Goal: Ability to tolerate increased activity will improve Outcome: Progressing   Problem: Respiratory: Goal: Ability to maintain a clear airway and adequate ventilation will improve Outcome: Progressing   Problem: Role Relationship: Goal: Method of communication will improve Outcome: Progressing

## 2022-08-07 NOTE — Progress Notes (Signed)
Was going to see pt for ambulation however pt seen wandering by himself at secretary station. Pt was brought back to room by nurse and transferred onto bed with assistance. Pt was educated on using call button.   Christen Bame 08/07/2022 2:34 PM

## 2022-08-07 NOTE — Discharge Instructions (Signed)

## 2022-08-07 NOTE — Progress Notes (Signed)
Inpatient Rehab Admissions:  Inpatient Rehab Consult received.  I met with patient and wife at the bedside for rehabilitation assessment and to discuss goals and expectations of an inpatient rehab admission.  Discussed average length of stay and discharge home after completion of CIR. Both acknowledged understanding. Pt interested in CIR. Wife supportive. Wife confirmed that she will be able to provide 24/7 support for pt after discharge. Will continue to follow.  Signed: Gayland Curry, [place], [place] Admissions Coordinator [phone]

## 2022-08-08 ENCOUNTER — Inpatient Hospital Stay (HOSPITAL_COMMUNITY): Payer: Medicare Other

## 2022-08-08 DIAGNOSIS — I2510 Atherosclerotic heart disease of native coronary artery without angina pectoris: Secondary | ICD-10-CM

## 2022-08-08 DIAGNOSIS — Z951 Presence of aortocoronary bypass graft: Secondary | ICD-10-CM | POA: Diagnosis not present

## 2022-08-08 LAB — CBC
HCT: 30.6 % — ABNORMAL LOW (ref 39.0–52.0)
Hemoglobin: 10.7 g/dL — ABNORMAL LOW (ref 13.0–17.0)
MCH: 29.3 pg (ref 26.0–34.0)
MCHC: 35 g/dL (ref 30.0–36.0)
MCV: 83.8 fL (ref 80.0–100.0)
Platelets: 198 10*3/uL (ref 150–400)
RBC: 3.65 MIL/uL — ABNORMAL LOW (ref 4.22–5.81)
RDW: 15 % (ref 11.5–15.5)
WBC: 9 10*3/uL (ref 4.0–10.5)
nRBC: 0 % (ref 0.0–0.2)

## 2022-08-08 LAB — BASIC METABOLIC PANEL
Anion gap: 4 — ABNORMAL LOW (ref 5–15)
BUN: 15 mg/dL (ref 8–23)
CO2: 28 mmol/L (ref 22–32)
Calcium: 8.6 mg/dL — ABNORMAL LOW (ref 8.9–10.3)
Chloride: 105 mmol/L (ref 98–111)
Creatinine, Ser: 0.8 mg/dL (ref 0.61–1.24)
GFR, Estimated: >60 mL/min (ref >60)
Glucose, Bld: 119 mg/dL — ABNORMAL HIGH (ref 70–99)
Potassium: 3.4 mmol/L — ABNORMAL LOW (ref 3.5–5.1)
Sodium: 137 mmol/L (ref 135–145)

## 2022-08-08 MED ORDER — RIVAROXABAN 20 MG PO TABS
20.0000 mg | ORAL_TABLET | Freq: Every day | ORAL | Status: DC
  Administered 2022-08-09: 20 mg via ORAL
  Filled 2022-08-08: qty 1

## 2022-08-08 MED ORDER — MELATONIN 3 MG PO TABS
3.0000 mg | ORAL_TABLET | Freq: Every evening | ORAL | Status: DC | PRN
  Administered 2022-08-09 (×2): 3 mg via ORAL
  Filled 2022-08-08 (×2): qty 1

## 2022-08-08 MED ORDER — ASPIRIN 81 MG PO TBEC
81.0000 mg | DELAYED_RELEASE_TABLET | Freq: Every day | ORAL | Status: DC
  Administered 2022-08-08 – 2022-08-10 (×3): 81 mg via ORAL
  Filled 2022-08-08 (×3): qty 1

## 2022-08-08 MED ORDER — POTASSIUM CHLORIDE CRYS ER 20 MEQ PO TBCR: 20.0000 meq | EXTENDED_RELEASE_TABLET | Freq: Two times a day (BID) | ORAL | Status: DC

## 2022-08-08 MED ORDER — METHOCARBAMOL 500 MG PO TABS: 500.0000 mg | ORAL_TABLET | Freq: Four times a day (QID) | ORAL | Status: DC | PRN

## 2022-08-08 MED ORDER — POTASSIUM CHLORIDE CRYS ER 20 MEQ PO TBCR
20.0000 meq | EXTENDED_RELEASE_TABLET | Freq: Three times a day (TID) | ORAL | Status: AC
  Administered 2022-08-08 (×3): 20 meq via ORAL
  Filled 2022-08-08 (×3): qty 1

## 2022-08-08 NOTE — PMR Pre-admission (Signed)
PMR Admission Coordinator Pre-Admission Assessment  Patient: Terry [name] is an 72 y.o., male MRN: [id number] DOB: 31-Jul-1950 Height: '5\' 8"'$  (172.7 cm) Weight: 73.4 kg  Insurance Information HMO:     PPO:      PCP:      IPA:      80/20: yes     OTHER:  PRIMARY: Medicare A & B      Policy#: [id number]      Subscriber: patient CM Name:       Phone#:      Fax#:  Pre-Cert#:       Employer:  Benefits:  Phone #: verified eligibility via [place] on 08/08/22     Name:  Eff. Date: Part A effective 01/30/14, Part B effective 04/01/16     Deduct: $1,632      Out of Pocket Max: NA      Life Max: NA CIR: 100% coverage      SNF: 100% coverage days 1-20, 80% coverage days 21-100 Outpatient: 80% coverage     Co-Pay: 20%  Home Health: 100% coverage      Co-Pay:  DME: 80% coverage     Co-Pay: 20% Providers: pt's choice SECONDARY: BCBS Supplement      Policy#: [id number]     Phone#: [phone]  Financial Counselor:       Phone#:   The "Data Collection Information Summary" for patients in Inpatient Rehabilitation Facilities with attached "Privacy Act [place] Records" was provided and verbally reviewed with: {CHL IP Patient Family TW:[id number]  Emergency Contact Information Contact Information     Name Relation Home Work Mobile   [place] Spouse [phone]  [phone]   [facility] Daughter   [phone]       Current Medical History  Patient Admitting Diagnosis: s/p CABG x3 History of Present Illness: Pt is a 72 year old male with medical hx significant for: PE (5 years ago), CAD, multiple back surgeries, GERD, h/o colon polyps, h/o prostate CA, h/o kidney stones, PNA, hypercholesterolemia.Coronary CTA on 07/15/22 revealed coronary calcium score of 440 with significant disease in RCA. Heart cath on 07/20/22 showed 95% proximal LAD stensosis and 90% proximal RCA stenosis. Cardiothoracic surgeon recommended radial diagnostic coronary angiography. Pt [facility] to [facility] on [facility]22 for scheduled CABG x3 by Dr. Tenny Craw. Pt extubated to 4L [place] on 08/05/22. Chest x-ray showed left pleural effusion. Therapy evaluations completed and CIR recommended d/t pt's deficits in functional mobility and inability to complete ADLs independently. ***    Patient's medical record from [facility] has been reviewed by the rehabilitation admission coordinator and physician.  Past Medical History  Past Medical History:  Diagnosis Date   Arthritis    neck, back, knees, right ankle   Bladder calculus    Chronic back pain    Coronary artery disease    GERD (gastroesophageal reflux disease)    History of colon polyps    benign   History of kidney stones    and bladder stone   History of prostate cancer urologist-- dr Alinda Money   dx 2014---  s/p  prostatectomy 05-22-2013 ,  Gleason 3+3   Hypercholesterolemia    takes Crestor daily   Hyperlipidemia    Pneumonia 10/2019   Pulmonary emboli ([place]) 10/2019   Wears glasses     Has the patient had major surgery during 100 days prior to admission? Yes  Family History   family history includes Dementia in his mother; Heart attack in his father; Heart disease in  his father; Pneumonia in his mother.  Current Medications  Current Facility-Administered Medications:    acetaminophen (TYLENOL) tablet 1,000 mg, 1,000 mg, Oral, Q6H, 1,000 mg at 08/08/22 0558 **OR** acetaminophen (TYLENOL) 160 MG/5ML solution 1,000 mg, 1,000 mg, Per Tube, Q6H, Barrett, Erin R, PA-C, 1,000 mg at 08/04/22 2326   acetaminophen (TYLENOL) tablet 650 mg, 650 mg, Oral, Q6H PRN, Enter, Pierre Bali, MD   aspirin EC tablet 81 mg, 81 mg, Oral, Daily, Melrose Nakayama, MD, 81 mg at 08/08/22 0910   bisacodyl (DULCOLAX) EC tablet 10 mg, 10 mg, Oral, Daily PRN **OR** bisacodyl (DULCOLAX) suppository 10 mg, 10 mg, Rectal, Daily PRN, Barrett, Erin R, PA-C   carbidopa-levodopa (SINEMET IR) 25-100 MG per tablet immediate release 1 tablet, 1 tablet, Oral, TID,  Barrett, Erin R, PA-C, 1 tablet at 08/08/22 0558   Chlorhexidine Gluconate Cloth 2 % PADS 6 each, 6 each, Topical, Daily, Enter, Pierre Bali, MD, 6 each at 08/08/22 0912   docusate sodium (COLACE) capsule 200 mg, 200 mg, Oral, BID PRN, Barrett, Erin R, PA-C   donepezil (ARICEPT) tablet 10 mg, 10 mg, Oral, QHS, Barrett, Erin R, PA-C, 10 mg at 08/07/22 2126   enoxaparin (LOVENOX) injection 40 mg, 40 mg, Subcutaneous, QHS, Melrose Nakayama, MD, 40 mg at 08/07/22 2125   furosemide (LASIX) tablet 20 mg, 20 mg, Oral, BID, Barrett, Erin R, PA-C, 20 mg at 08/08/22 0757   loratadine (CLARITIN) tablet 10 mg, 10 mg, Oral, Daily, Barrett, Erin R, PA-C, 10 mg at 08/08/22 0910   melatonin tablet 3 mg, 3 mg, Oral, QHS PRN, Melrose Nakayama, MD   methocarbamol (ROBAXIN) tablet 500 mg, 500 mg, Oral, Q6H PRN, Melrose Nakayama, MD   metoprolol tartrate (LOPRESSOR) tablet 12.5 mg, 12.5 mg, Oral, BID, Enter, Pierre Bali, MD, 12.5 mg at 08/08/22 0910   ondansetron (ZOFRAN) injection 4 mg, 4 mg, Intravenous, Q6H PRN, Barrett, Erin R, PA-C   Oral care mouth rinse, 15 mL, Mouth Rinse, PRN, Enter, Pierre Bali, MD   oxyCODONE (Oxy IR/ROXICODONE) immediate release tablet 5-10 mg, 5-10 mg, Oral, Q3H PRN, Barrett, Erin R, PA-C, 5 mg at 08/07/22 1007   pantoprazole (PROTONIX) EC tablet 40 mg, 40 mg, Oral, Daily, Enter, Pierre Bali, MD, 40 mg at 08/08/22 0910   polyvinyl alcohol (LIQUIFILM TEARS) 1.4 % ophthalmic solution 1 drop, 1 drop, Both Eyes, PRN, Enter, Pierre Bali, MD   potassium chloride SA (KLOR-CON M) CR tablet 20 mEq, 20 mEq, Oral, TID, Melrose Nakayama, MD, 20 mEq at 08/08/22 0910   [START ON 08/09/2022] potassium chloride SA (KLOR-CON M) CR tablet 20 mEq, 20 mEq, Oral, BID, Melrose Nakayama, MD   [START ON 08/09/2022] rivaroxaban (XARELTO) tablet 20 mg, 20 mg, Oral, Q supper, Melrose Nakayama, MD   rosuvastatin (CRESTOR) tablet 5 mg, 5 mg, Oral, Daily, Agarwala, Ravi, MD, 5 mg at 08/08/22 0910   sodium  chloride flush (NS) 0.9 % injection 3 mL, 3 mL, Intravenous, Q12H, Barrett, Erin R, PA-C, 3 mL at 08/08/22 0912   sodium chloride flush (NS) 0.9 % injection 3 mL, 3 mL, Intravenous, PRN, Barrett, Erin R, PA-C  Patients Current Diet:  Diet Order             Diet Carb Modified Fluid consistency: Thin; Room service appropriate? Yes with Assist  Diet effective now                   Precautions / Restrictions Precautions Precautions: Sternal, Fall  Restrictions Weight Bearing Restrictions: Yes RUE Weight Bearing: Weight bearing as tolerated LUE Weight Bearing: Weight bearing as tolerated   Has the patient had 2 or more falls or a fall with injury in the past year? No  Prior Activity Level Community (5-7x/wk): gets out of house daily, drives  Prior Functional Level Self Care: Did the patient need help bathing, dressing, using the toilet or eating? Independent  Indoor Mobility: Did the patient need assistance with walking from room to room (with or without device)? Independent  Stairs: Did the patient need assistance with internal or external stairs (with or without device)? Independent  Functional Cognition: Did the patient need help planning regular tasks such as shopping or remembering to take medications? Independent  Patient Information Are you of Hispanic, Latino/a,or Spanish origin?: A. No, not of Hispanic, Latino/a, or Spanish origin What is your race?: A. White Do you need or want an interpreter to communicate with a doctor or health care staff?: 0. No  Patient's Response To:  Health Literacy and Transportation Is the patient able to respond to health literacy and transportation needs?: Yes Health Literacy - How often do you need to have someone help you when you read instructions, pamphlets, or other written material from your doctor or pharmacy?: Never In the past 12 months, has lack of transportation kept you from medical appointments or from getting medications?:  No In the past 12 months, has lack of transportation kept you from meetings, work, or from getting things needed for daily living?: No  Home Assistive Devices / Equipment Home Equipment: Conservation officer, nature (2 wheels), Shower seat - built in  Prior Device Use: Indicate devices/aids used by the patient prior to current illness, exacerbation or injury? Walker  Current Functional Level Cognition  Overall Cognitive Status: Impaired/Different from baseline Current Attention Level: Sustained Orientation Level: Oriented to person, Oriented to place, Oriented to time, Oriented to situation Following Commands: Follows one step commands with increased time Safety/Judgement: Decreased awareness of safety, Decreased awareness of deficits General Comments: had pre-med prior to chest tube out    Extremity Assessment (includes Sensation/Coordination)  Upper Extremity Assessment: Overall WFL for tasks assessed RUE Deficits / Details: AROM WFL with sternal precautions; strength grossly 3+/5 RUE Coordination: decreased fine motor LUE Deficits / Details: AROM WFL with sternal precautions; strength grossly 3+/5 LUE Coordination: decreased fine motor  Lower Extremity Assessment: LLE deficits/detail, RLE deficits/detail RLE Deficits / Details: AAROM noted stiffness/rigidity throughout, able to lift antigravity, but limited knee flexion RLE Coordination: decreased gross motor LLE Deficits / Details: AAROM noted stiffness/rigidity throughout, able to lift antigravity, but limited knee flexion LLE Coordination: decreased gross motor    ADLs  Overall ADL's : Needs assistance/impaired Eating/Feeding: Independent, Sitting Eating/Feeding Details (indicate cue type and reason): in recliner Grooming: Supervision/safety, Set up, Sitting Grooming Details (indicate cue type and reason): in recliner Upper Body Bathing: Set up, Supervision/ safety, Sitting Upper Body Bathing Details (indicate cue type and reason): in  recliner Lower Body Bathing: Minimal assistance, Sit to/from stand Lower Body Bathing Details (indicate cue type and reason): min A +2 sit<>stand Upper Body Dressing : Set up, Supervision/safety, Sitting Upper Body Dressing Details (indicate cue type and reason): in recliner Lower Body Dressing: Moderate assistance Lower Body Dressing Details (indicate cue type and reason): min A +2 sit<>stand Toilet Transfer: Minimal assistance, +2 for physical assistance, +2 for safety/equipment, Ambulation, Rolling walker (2 wheels) Toileting- Clothing Manipulation and Hygiene: Total assistance Toileting - Clothing Manipulation Details (indicate cue type and  reason): min A +2 sit<>stand    Mobility  Overal bed mobility: Needs Assistance Bed Mobility: Rolling, Sidelying to Sit Rolling: Max assist Sidelying to sit: Max assist General bed mobility comments: pt up in recliner upon room entry    Transfers  Overall transfer level: Needs assistance Equipment used: Rolling walker (2 wheels) Transfers: Sit to/from Stand Sit to Stand: Min assist, +2 physical assistance General transfer comment: holding onto heart pillow and momentum to get up    Ambulation / Gait / Stairs / Wheelchair Mobility  Ambulation/Gait Ambulation/Gait assistance: Mod assist, +2 safety/equipment Gait Distance (Feet): 12 Feet (&20') Assistive device: Ethelene Hal Gait Pattern/deviations: Step-to pattern, Step-through pattern, Wide base of support, Trunk flexed, Decreased dorsiflexion - right, Decreased dorsiflexion - left, Decreased stride length General Gait Details: assist to guide walker and for forward momentum with very slight foot clearance; sat in chair in room and then stood again with RN pushing recliner behind    Posture / Balance Dynamic Sitting Balance Sitting balance - Comments: falling back and to R with multiple cues and facilitation for anterior and L lateral weight shift Balance Overall balance assessment: Needs  assistance Sitting-balance support: No upper extremity supported, Feet supported Sitting balance-Leahy Scale: Fair Sitting balance - Comments: falling back and to R with multiple cues and facilitation for anterior and L lateral weight shift Postural control: Right lateral lean, Posterior lean Standing balance support: Bilateral upper extremity supported Standing balance-Leahy Scale: Poor Standing balance comment: UE support and assist for balance in standing    Special needs/care consideration Skin Abrasion: back/left: Surgical incision: chest, leg   Previous Home Environment (from acute therapy documentation) Living Arrangements: Spouse/significant other  Lives With: Spouse Available Help at Discharge: Family, Available 24 hours/day Type of Home: House Home Layout: One level (has a bonus room upstairs) Home Access: Stairs to enter (threshold) Entrance Stairs-Rails: None Entrance Stairs-Number of Steps: threshold Bathroom Shower/Tub: Multimedia programmer: Handicapped height Bathroom Accessibility: Yes How Accessible: Accessible via walker Home Care Services: No  Discharge Living Setting Plans for Discharge Living Setting: Patient's home Type of Home at Discharge: House Discharge Home Layout: One level (has a bonus room upstairs) Discharge Home Access: Stairs to enter (threshold) Entrance Stairs-Number of Steps: threshold Discharge Bathroom Shower/Tub: Walk-in shower Discharge Bathroom Toilet: Handicapped height Discharge Bathroom Accessibility: Yes How Accessible: Accessible via walker Does the patient have any problems obtaining your medications?: No  Social/Family/Support Systems Anticipated Caregiver: Radcliffe Nuhn, wife Anticipated Caregiver's Contact Information: [phone] Caregiver Availability: 24/7 Discharge Plan Discussed with Primary Caregiver: Yes Is Caregiver In Agreement with Plan?: Yes Does Caregiver/Family have Issues with Lodging/Transportation  while Pt is in Rehab?: No  Goals Patient/Family Goal for Rehab: *** Expected length of stay: *** Pt/Family Agrees to Admission and willing to participate: Yes Program Orientation Provided & Reviewed with Pt/Caregiver Including Roles  & Responsibilities: Yes  Decrease burden of Care through IP rehab admission: NA  Possible need for SNF placement upon discharge: Not anticipated  Patient Condition: I have reviewed medical records from [facility], spoken with CM, and patient and spouse. I met with patient at the bedside for inpatient rehabilitation assessment.  Patient will benefit from ongoing PT and OT, can actively participate in 3 hours of therapy a day 5 days of the week, and can make measurable gains during the admission.  Patient will also benefit from the coordinated team approach during an Inpatient Acute Rehabilitation admission.  The patient will receive intensive therapy as well as Rehabilitation  physician, nursing, social worker, and care management interventions.  Due to safety, skin/wound care, disease management, medication administration, pain management, and patient education the patient requires 24 hour a day rehabilitation nursing.  The patient is currently *** with mobility and basic ADLs.  Discharge setting and therapy post discharge at home with home health is anticipated.  Patient has agreed to participate in the Acute Inpatient Rehabilitation Program and will admit {Time; today/tomorrow:10263}.  Preadmission Screen Completed By:  Bethel Born, 08/08/2022 11:04 AM ______________________________________________________________________   Discussed status with Dr. Marland Kitchen on *** at *** and received approval for admission today.  Admission Coordinator:  Bethel Born, CCC-SLP, time ***/Date ***   Assessment/Plan: Diagnosis: Does the need for close, 24 hr/day Medical supervision in concert with the patient's rehab needs make it unreasonable for this patient  to be served in a less intensive setting? {yes_no_potentially:3041433} Co-Morbidities requiring supervision/potential complications: *** Due to {due RE:[id number], does the patient require 24 hr/day rehab nursing? {yes_no_potentially:3041433} Does the patient require coordinated care of a physician, rehab nurse, PT, OT, and SLP to address physical and functional deficits in the context of the above medical diagnosis(es)? {yes_no_potentially:3041433} Addressing deficits in the following areas: {deficits:3041436} Can the patient actively participate in an intensive therapy program of at least 3 hrs of therapy 5 days a week? {yes_no_potentially:3041433} The potential for patient to make measurable gains while on inpatient rehab is {potential:3041437} Anticipated functional outcomes upon discharge from inpatient rehab: {functional outcomes:304600100} PT, {functional outcomes:304600100} OT, {functional outcomes:304600100} SLP Estimated rehab length of stay to reach the above functional goals is: *** Anticipated discharge destination: {anticipated dc setting:21604} 10. Overall Rehab/Functional Prognosis: {potential:3041437}   MD Signature: ***

## 2022-08-08 NOTE — Progress Notes (Signed)
CARDIAC REHAB PHASE I   PRE:  Rate/Rhythm: 86 SR    BP: sitting 112/64    SpO2: 96 RA  MODE:  Ambulation: 330 ft   POST:  Rate/Rhythm: 98 SR    BP: sitting 131/73     SpO2: 100 RA  Mod assist to get out of bed, needed verbal reminders of sternal precautions. Mod assist to stand. Pt ambulated with RW and gait belt. Slow and steady. Moving well once up. Return to bed, mod assist to get in bed. Encouraged IS and walking. Gave OHS booklet and discussed sternal precautions. Discussed CRPII and will refer to Anderson BS, ACSM-CEP 08/08/2022 1:13 PM

## 2022-08-08 NOTE — Progress Notes (Signed)
Arrived to 4E 10 from [place] as a transfer out of ICU. Placed on cardiac monitoring and oriented to unit.

## 2022-08-08 NOTE — TOC Initial Note (Signed)
Transition of Care [facility]) - Initial/Assessment Note    Patient Details  Name: Terry [name] MRN: [id number] Date of Birth: 23-Feb-1951  [facility] CM/SW Contact:    Erenest Rasher, RN Phone Number: [phone] 08/08/2022, 8:20 AM  Clinical Narrative:                 CM spoke to pt and wife at bedside. Pt states he was independent PTA. PT/OT recommended IP rehab. Pt does want IP rehab. CIR admission coordinator following for IP rehab. Will continue to follow for dc needs.   Expected Discharge Plan: Home/Self Care Barriers to Discharge: Continued Medical Work up   Patient Goals and CMS Choice Patient states their goals for this hospitalization and ongoing recovery are:: wants to remain independent          Expected Discharge Plan and Services   Discharge Planning Services: CM Consult   Living arrangements for the past 2 months: Single Family Home                                      Prior Living Arrangements/Services Living arrangements for the past 2 months: Single Family Home Lives with:: Spouse Patient language and need for interpreter reviewed:: Yes Do you feel safe going back to the place where you live?: Yes      Need for Family Participation in Patient Care: No (Comment) Care giver support system in place?: Yes (comment)   Criminal Activity/Legal Involvement Pertinent to Current Situation/Hospitalization: No - Comment as needed  Activities of Daily Living   ADL Screening (condition at time of admission) Patient's cognitive ability adequate to safely complete daily activities?: Yes Is the patient deaf or have difficulty hearing?: No Does the patient have difficulty seeing, even when wearing glasses/contacts?: No Does the patient have difficulty concentrating, remembering, or making decisions?: No Patient able to express need for assistance with ADLs?: Yes Does the patient have difficulty dressing or bathing?: No Independently performs  ADLs?: Yes (appropriate for developmental age) Does the patient have difficulty walking or climbing stairs?: No Weakness of Legs: None Weakness of Arms/Hands: None  Permission Sought/Granted Permission sought to share information with : Case Manager, PCP, Family Supports Permission granted to share information with : Yes, Verbal Permission Granted  Share Information with NAME: Terry [name]     Permission granted to share info w Relationship: wife  Permission granted to share info w Contact Information: [phone]  Emotional Assessment Appearance:: Appears stated age Attitude/Demeanor/Rapport: Engaged Affect (typically observed): Accepting Orientation: : Oriented to Self, Oriented to Place, Oriented to  Time, Oriented to Situation   Psych Involvement: No (comment)  Admission diagnosis:  S/P CABG x 3 [Z95.1] Patient Active Problem List   Diagnosis Date Noted   S/P CABG x 3 08/04/2022   Acute respiratory failure ([place]) 08/04/2022   CAD, multiple vessel 08/04/2022   Coronary artery disease 07/17/2022   Family history of heart disease 05/05/2022   Preoperative clearance 05/05/2022   Herniated nucleus pulposus, L2-3 right 03/24/2022   Excessive sleepiness 02/16/2022   Age-related cataract 09/29/2021   Hardening of the aorta (main artery of the heart) ([place]) 09/29/2021   Hematochezia 09/29/2021   Leukocytosis 09/29/2021   Long term (current) use of anticoagulants 09/29/2021   Personal history of pulmonary embolism 09/29/2021   Pleurodynia 09/29/2021   Pneumonia 09/29/2021   Venous thromboembolism 09/29/2021   Parkinsonism 09/29/2021  Mild cognitive impairment 09/29/2021   Pulmonary emboli ([facility]) 08/13/2020   Pulmonary embolism ([place]) 08/12/2020   Elevated blood-pressure reading without diagnosis of hypertension 05/21/2020   Body mass index (BMI) 26.0-26.9, adult 01/05/2020   Essential (primary) hypertension 01/05/2020   Herniated nucleus pulposus of lumbosacral region  12/11/2019   Heteronymous bilateral field defects 08/25/2019   Degeneration of lumbar intervertebral disc 05/03/2019   Kidney stone 02/21/2019   Infection, skin 07/26/2018   Achilles tendon infection 07/26/2018   Wound dehiscence, surgical 07/10/2018    Class: Acute   Wound dehiscence, surgical, initial encounter 07/10/2018   Non-thrombocytopenic purpura ([place]) 02/10/2018   Rupture of right Achilles tendon 11/05/2017   Bitemporal hemianopia 06/17/2017   Achilles bursitis 02/09/2017   Spondylolisthesis of lumbar region 08/31/2016   Lumbar radiculopathy 02/28/2016   Disorder of gallbladder 09/30/2015   Scrotal pain 07/11/2015   Spasm 07/11/2015   Personal history of colonic polyps 02/08/2015   Allergic urticaria 07/16/2014   Neck pain 06/27/2014   Prostate cancer ([place])    Elevated levels of transaminase & lactic acid dehydrogenase 01/23/2013   Encounter for screening for other disorder 01/23/2013   Hyperglycemia 01/23/2013   Melanocytic nevi, unspecified 01/23/2013   Backache 04/05/2012   Pain in right hand 04/05/2012   Acute stress reaction 12/29/2010   Allergic rhinitis 12/19/2009   Gastro-esophageal reflux disease without esophagitis 12/19/2009   Male erectile disorder 12/19/2009   Tear film insufficiency 12/19/2009   Carpal tunnel syndrome 09/03/2009   Visual disturbance 09/03/2009   Hyperlipidemia 09/03/2009   Osteoarthritis 09/03/2009   PCP:  Shon Baton, MD Pharmacy:   [facility] [phone] [facility] [place] [place] [address] [place] [address] Phone: [phone] Fax: [phone]     Social Determinants of Health (SDOH) Social History: SDOH Screenings   Depression (PHQ2-9): Low Risk  (07/26/2018)  Tobacco Use: Low Risk  (08/05/2022)   SDOH Interventions:     Readmission Risk Interventions     No data to display

## 2022-08-08 NOTE — Progress Notes (Signed)
NAME:  Terry [name], MRN:  [id number], DOB:  [birth year], LOS: 4 ADMISSION DATE:  08/04/2022, CONSULTATION DATE:  3/5 REFERRING MD:  Dr. Tenny Craw, CHIEF COMPLAINT:  CABG   History of Present Illness:  Patient is a 72 yo M w/ pertinent PMH CAD, HLD, previous PE 2021 on xarelto, chronic back pain presents to [facility] ED on 3/5 for CABG.  Patient has been complaining of exertional chest tightness and was referred to cardiology on 07/15/2022. Patient received CTA showing coronary calcium score of 440 w/ significant disease in his RCA. Received heart cath on 07/20/2022 showing 95% proximal LAD stenosis and 90% proximal RCA stenosis. Patient scheduled for CABG in 2 weeks. Will hold xarelto 2 days prior to procedure.  On 3/5 patient presents for elective CABG procedure. Post op intubated. PCCM consulted for medical management.  Pertinent  Medical History   Past Medical History:  Diagnosis Date   Arthritis    neck, back, knees, right ankle   Bladder calculus    Chronic back pain    Coronary artery disease    GERD (gastroesophageal reflux disease)    History of colon polyps    benign   History of kidney stones    and bladder stone   History of prostate cancer urologist-- dr Alinda Money   dx 2014---  s/p  prostatectomy 05-22-2013 ,  Gleason 3+3   Hypercholesterolemia    takes Crestor daily   Hyperlipidemia    Pneumonia 10/2019   Pulmonary emboli ([place]) 10/2019   Wears glasses    Significant Hospital Events: Including procedures, antibiotic start and stop dates in addition to other pertinent events   3/5 CABG x3 - fast-track extubation.   Interim History / Subjective:  Stated could not sleep well last night Denies pain  Objective   Blood pressure 110/75, pulse 90, temperature 98.8 F (37.1 C), temperature source Axillary, resp. rate (!) 22, height '5\' 8"'$  (1.727 m), weight 73.4 kg, SpO2 95 %.        Intake/Output Summary (Last 24 hours) at 08/08/2022 0755 Last data filed at 08/08/2022 0000 Gross  per 24 hour  Intake 660 ml  Output 2475 ml  Net -1815 ml   Filed Weights   08/06/22 0500 08/07/22 0500 08/08/22 0500  Weight: 78.2 kg 75.9 kg 73.4 kg   Examination: Physical exam: General: Elderly male, sitting on recliner HEENT: [place]/AT, eyes anicteric.  moist mucus membranes Neuro: Alert, awake following commands Chest: Central sternotomy wound looks clean and dry, coarse breath sounds, no wheezes or rhonchi Heart: Regular rate and rhythm, no murmurs or gallops Abdomen: Soft, nontender, nondistended, bowel sounds present Skin: No rash  Labs and images were reviewed   Assessment & Plan:  Coronary artery disease S/p 3-vessel CABG Acute respiratory insufficiency, postprocedure, resolved HLD Prior PE on anticoagulation Parkinson's disease, related dementia Mild thrombocytopenia Prediabetes Hyponatremia/hypokalemia  Continue aspirin, dose was decreased to 81 mg with plan to restart Xarelto Continue Crestor On room air Encourage incentive spirometry Continue pain control with Tylenol and oxycodone Encourage ambulation Continue to hold Xarelto for now Continue Sinemet and Aricept Continue aggressive electrolyte supplement Monitor fingerstick goal 140-180 Patient hemoglobin A1c 6.2  Best Practice (right click and "Reselect all SmartList Selections" daily)   Diet/type: Regular consistency DVT prophylaxis: Enoxaparin GI prophylaxis: N/A Lines: N/A Foley: N/A Code Status:  full code Last date of multidisciplinary goals of care discussion [per primary]    Jacky Kindle, MD [facility] See Amion for pager If no response  to pager, please call [phone] until 7pm After 7pm, Please call E-link [phone]

## 2022-08-08 NOTE — Progress Notes (Signed)
4 Days Post-Op Procedure(s) (LRB): CORONARY ARTERY BYPASS GRAFTING (CABG) X THREE USING LEFT INTERNAL MAMMARY ARTERY ([place]) AND ENDOSCOPICALLY HARVESTED RIGHT GREATER SAPHENOUS VEIN. (N/A) TRANSESOPHAGEAL ECHOCARDIOGRAM (N/A) CLIPPING OF ATRIAL APPENDAGE USING AN ATRICURE 45 ATRICLIP (Left) Subjective: Didn't sleep well, denies pain  Objective: Vital signs in last 24 hours: Temp:  [98.4 F (36.9 C)-98.8 F (37.1 C)] 98.8 F (37.1 C) (03/08 2000) Pulse Rate:  [68-101] 90 (03/09 0600) Cardiac Rhythm: Normal sinus rhythm (03/09 0417) Resp:  [14-27] 22 (03/09 0600) BP: (82-119)/(56-83) 110/75 (03/09 0600) SpO2:  [93 %-97 %] 95 % (03/09 0600) Weight:  [73.4 kg] 73.4 kg (03/09 0500)  Hemodynamic parameters for last 24 hours:    Intake/Output from previous day: 03/08 0701 - 03/09 0700 In: 660 [P.O.:660] Out: 2475 [Urine:2475] Intake/Output this shift: No intake/output data recorded.  General appearance: alert, cooperative, and no distress Neurologic: intact Heart: regular rate and rhythm Lungs: diminished breath sounds bibasilar Wound: clean and dry  Lab Results: Recent Labs    08/06/22 0447 08/08/22 0309  WBC 8.2 9.0  HGB 10.1* 10.7*  HCT 31.2* 30.6*  PLT 142* 198   BMET:  Recent Labs    08/06/22 0447 08/08/22 0309  NA 131* 137  K 3.8 3.4*  CL 101 105  CO2 23 28  GLUCOSE 131* 119*  BUN 11 15  CREATININE 0.77 0.80  CALCIUM 8.1* 8.6*    PT/INR: No results for input(s): "LABPROT", "INR" in the last 72 hours. ABG    Component Value Date/Time   PHART 7.289 (L) 08/05/2022 0115   HCO3 19.1 (L) 08/05/2022 0115   TCO2 20 (L) 08/05/2022 0115   ACIDBASEDEF 7.0 (H) 08/05/2022 0115   O2SAT 99 08/05/2022 0115   CBG (last 3)  Recent Labs    08/07/22 0445 08/07/22 0733 08/07/22 1117  GLUCAP 102* 134* 112*    Assessment/Plan: S/P Procedure(s) (LRB): CORONARY ARTERY BYPASS GRAFTING (CABG) X THREE USING LEFT INTERNAL MAMMARY ARTERY ([place]) AND ENDOSCOPICALLY  HARVESTED RIGHT GREATER SAPHENOUS VEIN. (N/A) TRANSESOPHAGEAL ECHOCARDIOGRAM (N/A) CLIPPING OF ATRIAL APPENDAGE USING AN ATRICURE 45 ATRICLIP (Left) - NEURO- no focal deficit CV- in SR  ASA- decrease to 81 mg in prep for resuming Xarelto RESP- IS  RENAL- creatinine OK, on BID Lasix  Hypokalemia- supplement ENDo- CBG well controlled GI- tolerating diet Hx DVT- on Lovenox, will resume Xarelto tomorrow Cardiac rehab  LOS: 4 days    Melrose Nakayama 08/08/2022

## 2022-08-09 LAB — BASIC METABOLIC PANEL
Anion gap: 8 (ref 5–15)
BUN: 15 mg/dL (ref 8–23)
CO2: 30 mmol/L (ref 22–32)
Calcium: 9.2 mg/dL (ref 8.9–10.3)
Chloride: 101 mmol/L (ref 98–111)
Creatinine, Ser: 0.94 mg/dL (ref 0.61–1.24)
GFR, Estimated: >60 mL/min (ref >60)
Glucose, Bld: 119 mg/dL — ABNORMAL HIGH (ref 70–99)
Potassium: 3.7 mmol/L (ref 3.5–5.1)
Sodium: 139 mmol/L (ref 135–145)

## 2022-08-09 NOTE — Progress Notes (Addendum)
Patient ambulated in hallway 400 feet with rolling walker and nursing staff. Gait slightly unsteady. Back in room chair alarm on and call bell within reach.Patient states he has a Corporate investment banker at home. Terry [name], Bettina Gavia RN

## 2022-08-09 NOTE — Progress Notes (Addendum)
      [address]       [place] [address]             [phone]      5 Days Post-Op Procedure(s) (LRB): CORONARY ARTERY BYPASS GRAFTING (CABG) X THREE USING LEFT INTERNAL MAMMARY ARTERY ([place]) AND ENDOSCOPICALLY HARVESTED RIGHT GREATER SAPHENOUS VEIN. (N/A) TRANSESOPHAGEAL ECHOCARDIOGRAM (N/A) CLIPPING OF ATRIAL APPENDAGE USING AN ATRICURE 45 ATRICLIP (Left)  Subjective:  No specific complaints.  He is frustrated at lack of activity.  Wants to ambulate more   Objective: Vital signs in last 24 hours: Temp:  [98.1 F (36.7 C)-98.6 F (37 C)] 98.2 F (36.8 C) (03/10 0749) Pulse Rate:  [76-93] 93 (03/10 0749) Cardiac Rhythm: Normal sinus rhythm (03/09 1916) Resp:  [18-25] 18 (03/10 0333) BP: (100-131)/(50-98) 102/63 (03/10 0749) SpO2:  [95 %-99 %] 98 % (03/10 0749) Weight:  [72.3 kg] 72.3 kg (03/10 0629)  Intake/Output from previous day: 03/09 0701 - 03/10 0700 In: 720 [P.O.:720] Out: 1000 [Urine:1000]  General appearance: alert, cooperative, and no distress Heart: regular rate and rhythm Lungs: clear to auscultation bilaterally Abdomen: soft, non-tender; bowel sounds normal; no masses,  no organomegaly Extremities: edema none, ecchymosis RLE Wound: clean and dry  Lab Results: Recent Labs    08/08/22 0309  WBC 9.0  HGB 10.7*  HCT 30.6*  PLT 198   BMET:  Recent Labs    08/08/22 0309 08/09/22 0127  NA 137 139  K 3.4* 3.7  CL 105 101  CO2 28 30  GLUCOSE 119* 119*  BUN 15 15  CREATININE 0.80 0.94  CALCIUM 8.6* 9.2    PT/INR: No results for input(s): "LABPROT", "INR" in the last 72 hours. ABG    Component Value Date/Time   PHART 7.289 (L) 08/05/2022 0115   HCO3 19.1 (L) 08/05/2022 0115   TCO2 20 (L) 08/05/2022 0115   ACIDBASEDEF 7.0 (H) 08/05/2022 0115   O2SAT 99 08/05/2022 0115   CBG (last 3)  Recent Labs    08/07/22 0445 08/07/22 0733 08/07/22 1117  GLUCAP 102* 134* 112*    Assessment/Plan: S/P Procedure(s)  (LRB): CORONARY ARTERY BYPASS GRAFTING (CABG) X THREE USING LEFT INTERNAL MAMMARY ARTERY ([place]) AND ENDOSCOPICALLY HARVESTED RIGHT GREATER SAPHENOUS VEIN. (N/A) TRANSESOPHAGEAL ECHOCARDIOGRAM (N/A) CLIPPING OF ATRIAL APPENDAGE USING AN ATRICURE 45 ATRICLIP (Left)  CV- NSR, BP stable- continue Lopressor 12.5 mg BID H/O PE- resume Xarelto Pulm- off oxygen, continue IS Renal- creatinine has been stable, weight is below baseline, stop Lasix Hypokalemia resolved- will stop potassium as lasix has been discontinued Parkinson's- continue home meds Deconditioning- for CIR at discharge Dispo- patient doing well, he is ready for discharge to CIR once bed is available and authorization is obtained   LOS: 5 days    Ellwood Handler, PA-C 08/09/2022 Patient seen and examined, agree with above Should be ready for CIR when bed available  Remo Lipps C. Roxan Hockey, MD [facility] [phone]

## 2022-08-09 NOTE — Progress Notes (Signed)
Mobility Specialist - Progress Note   08/09/22 1200  Mobility  Activity Ambulated with assistance in hallway  Level of Assistance Standby assist, set-up cues, supervision of patient - no hands on  Assistive Device Front wheel walker  Distance Ambulated (ft) 450 ft  Activity Response Tolerated well  Mobility Referral Yes  $Mobility charge 1 Mobility    Pt received in recliner and agreeable. No complaints on walk, no physical assistance needed. Left in bed w/ call bell in reach and all needs met.   [place] Specialist Please contact via SecureChat or Rehab office at [phone]

## 2022-08-10 ENCOUNTER — Inpatient Hospital Stay (HOSPITAL_COMMUNITY)
Admission: RE | Admit: 2022-08-10 | Discharge: 2022-08-18 | DRG: 946 | Disposition: A | Discharge status: Home / Self Care | Payer: Medicare Other | Source: Intra-hospital | Attending: Physical Medicine & Rehabilitation | Admitting: Physical Medicine & Rehabilitation

## 2022-08-10 DIAGNOSIS — Z888 Allergy status to other drugs, medicaments and biological substances status: Secondary | ICD-10-CM | POA: Diagnosis not present

## 2022-08-10 DIAGNOSIS — Z79899 Other long term (current) drug therapy: Secondary | ICD-10-CM

## 2022-08-10 DIAGNOSIS — I10 Essential (primary) hypertension: Secondary | ICD-10-CM | POA: Diagnosis present

## 2022-08-10 DIAGNOSIS — Z86711 Personal history of pulmonary embolism: Secondary | ICD-10-CM | POA: Diagnosis not present

## 2022-08-10 DIAGNOSIS — K5900 Constipation, unspecified: Secondary | ICD-10-CM | POA: Diagnosis not present

## 2022-08-10 DIAGNOSIS — Z880 Allergy status to penicillin: Secondary | ICD-10-CM | POA: Diagnosis not present

## 2022-08-10 DIAGNOSIS — E7800 Pure hypercholesterolemia, unspecified: Secondary | ICD-10-CM | POA: Diagnosis present

## 2022-08-10 DIAGNOSIS — R5381 Other malaise: Principal | ICD-10-CM | POA: Diagnosis present

## 2022-08-10 DIAGNOSIS — Z7982 Long term (current) use of aspirin: Secondary | ICD-10-CM | POA: Diagnosis not present

## 2022-08-10 DIAGNOSIS — Z951 Presence of aortocoronary bypass graft: Secondary | ICD-10-CM | POA: Diagnosis not present

## 2022-08-10 DIAGNOSIS — Z9079 Acquired absence of other genital organ(s): Secondary | ICD-10-CM

## 2022-08-10 DIAGNOSIS — G20A1 Parkinson's disease without dyskinesia, without mention of fluctuations: Secondary | ICD-10-CM | POA: Diagnosis present

## 2022-08-10 DIAGNOSIS — G8929 Other chronic pain: Secondary | ICD-10-CM | POA: Diagnosis present

## 2022-08-10 DIAGNOSIS — Z981 Arthrodesis status: Secondary | ICD-10-CM | POA: Diagnosis not present

## 2022-08-10 DIAGNOSIS — I2510 Atherosclerotic heart disease of native coronary artery without angina pectoris: Secondary | ICD-10-CM | POA: Diagnosis present

## 2022-08-10 DIAGNOSIS — Z8249 Family history of ischemic heart disease and other diseases of the circulatory system: Secondary | ICD-10-CM | POA: Diagnosis not present

## 2022-08-10 DIAGNOSIS — K219 Gastro-esophageal reflux disease without esophagitis: Secondary | ICD-10-CM | POA: Diagnosis present

## 2022-08-10 DIAGNOSIS — M549 Dorsalgia, unspecified: Secondary | ICD-10-CM | POA: Diagnosis present

## 2022-08-10 DIAGNOSIS — Z7901 Long term (current) use of anticoagulants: Secondary | ICD-10-CM

## 2022-08-10 DIAGNOSIS — Z8546 Personal history of malignant neoplasm of prostate: Secondary | ICD-10-CM | POA: Diagnosis not present

## 2022-08-10 MED ORDER — CARBIDOPA-LEVODOPA 25-100 MG PO TABS
1.0000 | ORAL_TABLET | Freq: Three times a day (TID) | ORAL | Status: DC
  Administered 2022-08-11 – 2022-08-18 (×22): 1 via ORAL
  Filled 2022-08-10 (×22): qty 1

## 2022-08-10 MED ORDER — PROCHLORPERAZINE EDISYLATE 10 MG/2ML IJ SOLN: 5.0000 mg | Freq: Four times a day (QID) | INTRAMUSCULAR | Status: DC | PRN

## 2022-08-10 MED ORDER — DONEPEZIL HCL 10 MG PO TABS
10.0000 mg | ORAL_TABLET | Freq: Every day | ORAL | Status: DC
  Administered 2022-08-10 – 2022-08-17 (×8): 10 mg via ORAL
  Filled 2022-08-10 (×8): qty 1

## 2022-08-10 MED ORDER — ORAL CARE MOUTH RINSE: 15.0000 mL | OROMUCOSAL | Status: DC | PRN

## 2022-08-10 MED ORDER — RIVAROXABAN 20 MG PO TABS
20.0000 mg | ORAL_TABLET | Freq: Every day | ORAL | Status: DC
  Administered 2022-08-11 – 2022-08-17 (×7): 20 mg via ORAL
  Filled 2022-08-10 (×7): qty 1

## 2022-08-10 MED ORDER — GUAIFENESIN-DM 100-10 MG/5ML PO SYRP: 5.0000 mL | ORAL_SOLUTION | Freq: Four times a day (QID) | ORAL | Status: DC | PRN

## 2022-08-10 MED ORDER — ROSUVASTATIN CALCIUM 5 MG PO TABS
5.0000 mg | ORAL_TABLET | Freq: Every day | ORAL | Status: DC
  Administered 2022-08-11 – 2022-08-18 (×8): 5 mg via ORAL
  Filled 2022-08-10 (×8): qty 1

## 2022-08-10 MED ORDER — ACETAMINOPHEN 325 MG PO TABS
325.0000 mg | ORAL_TABLET | ORAL | Status: DC | PRN
  Administered 2022-08-10 – 2022-08-13 (×3): 650 mg via ORAL
  Filled 2022-08-10 (×3): qty 2

## 2022-08-10 MED ORDER — DIPHENHYDRAMINE HCL 12.5 MG/5ML PO ELIX: 12.5000 mg | ORAL_SOLUTION | Freq: Four times a day (QID) | ORAL | Status: DC | PRN

## 2022-08-10 MED ORDER — OXYCODONE HCL 5 MG PO TABS: 5.0000 mg | ORAL_TABLET | ORAL | 0 refills | Status: DC | PRN

## 2022-08-10 MED ORDER — BISACODYL 10 MG RE SUPP: 10.0000 mg | Freq: Every day | RECTAL | Status: DC | PRN

## 2022-08-10 MED ORDER — TRAZODONE HCL 50 MG PO TABS
25.0000 mg | ORAL_TABLET | Freq: Every evening | ORAL | Status: DC | PRN
  Administered 2022-08-15 – 2022-08-17 (×2): 50 mg via ORAL
  Filled 2022-08-10 (×4): qty 1

## 2022-08-10 MED ORDER — LORATADINE 10 MG PO TABS
10.0000 mg | ORAL_TABLET | Freq: Every day | ORAL | Status: DC
  Administered 2022-08-11 – 2022-08-18 (×8): 10 mg via ORAL
  Filled 2022-08-10 (×8): qty 1

## 2022-08-10 MED ORDER — DOCUSATE SODIUM 100 MG PO CAPS: 200.0000 mg | ORAL_CAPSULE | Freq: Two times a day (BID) | ORAL | Status: DC | PRN

## 2022-08-10 MED ORDER — METOPROLOL TARTRATE 25 MG PO TABS: 12.5000 mg | ORAL_TABLET | Freq: Two times a day (BID) | ORAL | 3 refills | Status: DC

## 2022-08-10 MED ORDER — PROCHLORPERAZINE MALEATE 5 MG PO TABS: 5.0000 mg | ORAL_TABLET | Freq: Four times a day (QID) | ORAL | Status: DC | PRN

## 2022-08-10 MED ORDER — ALUM & MAG HYDROXIDE-SIMETH 200-200-20 MG/5ML PO SUSP: 30.0000 mL | ORAL | Status: DC | PRN

## 2022-08-10 MED ORDER — PANTOPRAZOLE SODIUM 40 MG PO TBEC
40.0000 mg | DELAYED_RELEASE_TABLET | Freq: Every day | ORAL | Status: DC
  Administered 2022-08-11 – 2022-08-18 (×8): 40 mg via ORAL
  Filled 2022-08-10 (×8): qty 1

## 2022-08-10 MED ORDER — MELATONIN 3 MG PO TABS
3.0000 mg | ORAL_TABLET | Freq: Every evening | ORAL | Status: DC | PRN
  Administered 2022-08-10 – 2022-08-11 (×2): 3 mg via ORAL
  Filled 2022-08-10 (×4): qty 1

## 2022-08-10 MED ORDER — POLYVINYL ALCOHOL 1.4 % OP SOLN: 1.0000 [drp] | OPHTHALMIC | Status: DC | PRN

## 2022-08-10 MED ORDER — BISACODYL 5 MG PO TBEC: 10.0000 mg | DELAYED_RELEASE_TABLET | Freq: Every day | ORAL | Status: DC | PRN

## 2022-08-10 MED ORDER — METHOCARBAMOL 500 MG PO TABS: 500.0000 mg | ORAL_TABLET | Freq: Four times a day (QID) | ORAL | Status: DC | PRN

## 2022-08-10 MED ORDER — POLYETHYLENE GLYCOL 3350 17 G PO PACK: 17.0000 g | PACK | Freq: Every day | ORAL | Status: DC | PRN

## 2022-08-10 MED ORDER — OXYCODONE HCL 5 MG PO TABS: 5.0000 mg | ORAL_TABLET | ORAL | Status: DC | PRN

## 2022-08-10 MED ORDER — ASPIRIN 81 MG PO TBEC
81.0000 mg | DELAYED_RELEASE_TABLET | Freq: Every day | ORAL | Status: DC
  Administered 2022-08-11 – 2022-08-18 (×8): 81 mg via ORAL
  Filled 2022-08-10 (×8): qty 1

## 2022-08-10 MED ORDER — FLEET ENEMA 7-19 GM/118ML RE ENEM: 1.0000 | ENEMA | Freq: Once | RECTAL | Status: DC | PRN

## 2022-08-10 MED ORDER — METOPROLOL TARTRATE 12.5 MG HALF TABLET
12.5000 mg | ORAL_TABLET | Freq: Two times a day (BID) | ORAL | Status: DC
  Administered 2022-08-10 – 2022-08-18 (×15): 12.5 mg via ORAL
  Filled 2022-08-10 (×16): qty 1

## 2022-08-10 MED ORDER — PROCHLORPERAZINE 25 MG RE SUPP: 12.5000 mg | Freq: Four times a day (QID) | RECTAL | Status: DC | PRN

## 2022-08-10 MED FILL — Heparin Sodium (Porcine) Inj 1000 Unit/ML: Qty: 1000 | Status: AC

## 2022-08-10 MED FILL — Potassium Chloride Inj 2 mEq/ML: INTRAVENOUS | Qty: 40 | Status: AC

## 2022-08-10 MED FILL — Magnesium Sulfate Inj 50%: INTRAMUSCULAR | Qty: 10 | Status: AC

## 2022-08-10 NOTE — Progress Notes (Signed)
Sutures removed per order  

## 2022-08-10 NOTE — Care Management Important Message (Signed)
Important Message  Patient Details  Name: Terry [name] MRN: [id number] Date of Birth: Feb 02, 1951   Medicare Important Message Given:  Yes     [name] 08/10/2022, 8:53 AM

## 2022-08-10 NOTE — Progress Notes (Addendum)
      [address]       [place] [address]             [phone]      6 Days Post-Op Procedure(s) (LRB): CORONARY ARTERY BYPASS GRAFTING (CABG) X THREE USING LEFT INTERNAL MAMMARY ARTERY ([place]) AND ENDOSCOPICALLY HARVESTED RIGHT GREATER SAPHENOUS VEIN. (N/A) TRANSESOPHAGEAL ECHOCARDIOGRAM (N/A) CLIPPING OF ATRIAL APPENDAGE USING AN ATRICURE 45 ATRICLIP (Left)  Subjective:  Patient without complaints.  Hoping to get to CIR today.  Ambulated in the hallway several times.  Objective: Vital signs in last 24 hours: Temp:  [97.7 F (36.5 C)-98.6 F (37 C)] 98.2 F (36.8 C) (03/11 0323) Pulse Rate:  [81-93] 83 (03/11 0323) Cardiac Rhythm: Normal sinus rhythm (03/10 1935) Resp:  [18] 18 (03/11 0323) BP: (99-118)/(63-80) 118/80 (03/11 0323) SpO2:  [95 %-100 %] 100 % (03/11 0323) Weight:  [71.8 kg] 71.8 kg (03/11 0323)  Intake/Output from previous day: 03/10 0701 - 03/11 0700 In: 360 [P.O.:360] Out: 500 [Urine:500]  General appearance: alert, cooperative, and no distress Heart: regular rate and rhythm Lungs: clear to auscultation bilaterally Abdomen: soft, non-tender; bowel sounds normal; no masses,  no organomegaly Extremities: edema none present,  Wound: clean and dry, ecchymosis RLE  Lab Results: Recent Labs    08/08/22 0309  WBC 9.0  HGB 10.7*  HCT 30.6*  PLT 198   BMET:  Recent Labs    08/08/22 0309 08/09/22 0127  NA 137 139  K 3.4* 3.7  CL 105 101  CO2 28 30  GLUCOSE 119* 119*  BUN 15 15  CREATININE 0.80 0.94  CALCIUM 8.6* 9.2    PT/INR: No results for input(s): "LABPROT", "INR" in the last 72 hours. ABG    Component Value Date/Time   PHART 7.289 (L) 08/05/2022 0115   HCO3 19.1 (L) 08/05/2022 0115   TCO2 20 (L) 08/05/2022 0115   ACIDBASEDEF 7.0 (H) 08/05/2022 0115   O2SAT 99 08/05/2022 0115   CBG (last 3)  Recent Labs    08/07/22 0733 08/07/22 1117  GLUCAP 134* 112*    Assessment/Plan: S/P Procedure(s)  (LRB): CORONARY ARTERY BYPASS GRAFTING (CABG) X THREE USING LEFT INTERNAL MAMMARY ARTERY ([place]) AND ENDOSCOPICALLY HARVESTED RIGHT GREATER SAPHENOUS VEIN. (N/A) TRANSESOPHAGEAL ECHOCARDIOGRAM (N/A) CLIPPING OF ATRIAL APPENDAGE USING AN ATRICURE 45 ATRICLIP (Left)  CV- NSR, BP controlled- continue Lopressor Pulm- no acute issues, not requiring oxygen, continue IS H/O PE Xarelto has been resumed Parkinson's continue home meds Deconditioning- PT/OT recs CIR Dispo- patient doing well, he is stable for discharge to CIR once bed available   LOS: 6 days    Ellwood Handler, PA-C 08/10/2022   Chart reviewed, patient examined, agree with above. He is doing well medically. Waiting to hear about CIR and hoping he can go there.

## 2022-08-10 NOTE — Progress Notes (Signed)
CARDIAC REHAB PHASE I   PRE:  Rate/Rhythm: 81 SR    BP: sitting 93/70    SpO2: 97 RA  MODE:  Ambulation: 600 ft   POST:  Rate/Rhythm: 97 SR    BP: sitting 107/74     SpO2: 99 RA  Pt needing verbal cues for sternal precautions getting out of bed. Stood with min assist with gait belt. Walked with RW, steady. Last 240 ft ambulated without RW, contact guard with gait belt. Pt slightly weak but able to manage. To recliner, VSS.   Discussed with pt and wife IS, sternal precautions, diet, exercise, and CRPII. Receptive. Will change referral to [facility] Set up d/c video. He can walk with staff and family as wel. (Likes to walk). [place] BS, ACSM-CEP 08/10/2022 11:28 AM

## 2022-08-10 NOTE — H&P (Incomplete)
Physical Medicine and Rehabilitation Admission H&P    CC: Functional deficits secondary to CAD s/p CABG  HPI: Terry [name] is a 72 year old male who was evaluated by Dr. Gwenlyn Found on 07/17/2022 and had obtained a coronary CTA on 07/15/2022 revealing a coronary calcium score of 440 with significant disease in his RCA by FFR analysis.  Based on this, it was decided to proceed with outpatient radial diagnostic coronary angiography.  He was asked to hold his Xarelto 2 days before the procedure.  He was evaluated by Dr. Tenny Craw and scheduled for coronary artery bypass grafting.  On 08/04/2022, he underwent CABG x 3 vessels, clipping of atrial appendage and vein harvest from right lower extremity.  Transferred to cardiac ICU with labile blood pressure.  Extubated on 3/06.  Mild delirium 3/08.  Foley discontinued.  Multiple loose stools overnight and bowel regimen decreased.  Started on aspirin 81 mg daily.  Lovenox for DVT prophylaxis.  Tolerating diet.  Xarelto resumed 3/10. Kidney function and hemoglobin remained stable.  Requiring no oxygen supplementation.  Ambulated yesterday 450 feet with front wheel rolling walker.The patient requires inpatient medicine and rehabilitation evaluations and services for ongoing dysfunction secondary to coronary artery disease status post CABG.  Review of Systems  Constitutional:  Negative for chills and fever.  HENT:  Negative for hearing loss and sore throat.   Eyes:  Negative for blurred vision and double vision.  Respiratory:  Negative for cough and shortness of breath.   Cardiovascular:  Negative for chest pain and palpitations.  Gastrointestinal:  Negative for nausea and vomiting.  Genitourinary:  Negative for dysuria and urgency.       Some hesitancy  Neurological:  Negative for dizziness and headaches.  Psychiatric/Behavioral:  Negative for depression. The patient does not have insomnia.    Past Medical History:  Diagnosis Date   Arthritis    neck, back, knees,  right ankle   Bladder calculus    Chronic back pain    Coronary artery disease    GERD (gastroesophageal reflux disease)    History of colon polyps    benign   History of kidney stones    and bladder stone   History of prostate cancer urologist-- dr Alinda Money   dx 2014---  s/p  prostatectomy 05-22-2013 ,  Gleason 3+3   Hypercholesterolemia    takes Crestor daily   Hyperlipidemia    Pneumonia 10/2019   Pulmonary emboli ([place]) 10/2019   Wears glasses    Past Surgical History:  Procedure Laterality Date   ACHILLES TENDON SURGERY Right 06-07-2018   '@[facility]'$    reconstruction   ANTERIOR CERVICAL DECOMP/DISCECTOMY FUSION N/A 06/27/2014   Procedure: ANTERIOR CERVICAL DECOMPRESSION/DISCECTOMY FUSION C5-C7   (2 LEVELS);  Surgeon: Melina Schools, MD;  Location: [place];  Service: Orthopedics;  Laterality: N/A;   back fusion  03/2022   CARDIAC CATHETERIZATION  2004   CLIPPING OF ATRIAL APPENDAGE Left 08/04/2022   Procedure: CLIPPING OF ATRIAL APPENDAGE USING AN ATRICURE 43 ATRICLIP;  Surgeon: Neomia Glass, MD;  Location: [place];  Service: Open Heart Surgery;  Laterality: Left;   COLONOSCOPY     CORONARY ARTERY BYPASS GRAFT N/A 08/04/2022   Procedure: CORONARY ARTERY BYPASS GRAFTING (CABG) X THREE USING LEFT INTERNAL MAMMARY ARTERY (LIMA) AND ENDOSCOPICALLY HARVESTED RIGHT GREATER SAPHENOUS VEIN.;  Surgeon: Neomia Glass, MD;  Location: [place];  Service: Open Heart Surgery;  Laterality: N/A;   CYSTOSCOPY WITH LITHOLAPAXY N/A 03/27/2019   Procedure: CYSTOSCOPY WITH REMOVAL OF BLADDER STONE AND  FOREIGN BODY;  Surgeon: Raynelle Bring, MD;  Location: [facility];  Service: Urology;  Laterality: N/A;   EYE SURGERY     lazy eye as child, lasik surgery   FINGER SURGERY Right    4th finger   I & D EXTREMITY Right 07/10/2018   Procedure: IRRIGATION AND DEBRIDEMENT RIGHT ANKLE WOUND AND WOUND VAC PLACEMENT;  Surgeon: Wylene Simmer, MD;  Location: [place];  Service: Orthopedics;  Laterality: Right;    KNEE ARTHROSCOPY Bilateral right ?;  left 05-25-2007  '@[facility]'$    LEFT HEART CATH AND CORONARY ANGIOGRAPHY N/A 07/20/2022   Procedure: LEFT HEART CATH AND CORONARY ANGIOGRAPHY;  Surgeon: Lorretta Harp, MD;  Location: [facility];  Service: Cardiovascular;  Laterality: N/A;   POSTERIOR LAMINECTOMY / DECOMPRESSION LUMBAR SPINE  08-31-2016    dr elsner  '@[facility]'$    PROSTATE BIOPSY  04/13/2013   gleason 3+3=6, vol 55.4 cc   ROBOT ASSISTED LAPAROSCOPIC RADICAL PROSTATECTOMY N/A 05/22/2013   Procedure: ROBOTIC ASSISTED LAPAROSCOPIC RADICAL PROSTATECTOMY LEVEL 1;  Surgeon: Dutch Gray, MD;  Location: [facility];  Service: Urology;  Laterality: N/A;   TEE WITHOUT CARDIOVERSION N/A 08/04/2022   Procedure: TRANSESOPHAGEAL ECHOCARDIOGRAM;  Surgeon: Neomia Glass, MD;  Location: [place];  Service: Open Heart Surgery;  Laterality: N/A;   TONSILLECTOMY     Family History  Problem Relation Age of Onset   Pneumonia Mother        bacterial   Dementia Mother    Heart disease Father    Heart attack Father    Colon cancer Neg Hx    Sleep apnea Neg Hx    Social History:  reports that he has never smoked. He has never used smokeless tobacco. He reports current alcohol use. He reports that he does not use drugs. Allergies:  Allergies  Allergen Reactions   Lipitor [Atorvastatin] Other (See Comments)    Muscle issues   Penicillins Hives, Itching and Other (See Comments)    Has patient had a PCN reaction causing immediate rash, facial/tongue/throat swelling, SOB or lightheadedness with hypotension: No Has patient had a PCN reaction causing SEVERE RASH INVOLVING MUCUS MEMBRANES or SKIN NECROSIS: #  #  #  YES  #  #  #  Has patient had a PCN reaction that required hospitalization No Has patient had a PCN reaction occurring within the last 10 years: No    Medications Prior to Admission  Medication Sig Dispense Refill   acetaminophen (TYLENOL) 500 MG tablet Take 500 mg by mouth daily.     aspirin EC 81 MG tablet  Take 1 tablet (81 mg total) by mouth daily. Swallow whole.     carbidopa-levodopa (SINEMET IR) 25-100 MG tablet Take 1 tablet by mouth 3 (three) times daily. 270 tablet 3   cetirizine (ZYRTEC) 10 MG tablet Take 10 mg by mouth daily.     cyanocobalamin (VITAMIN B12) 1000 MCG tablet Take 1,000 mcg by mouth daily.     donepezil (ARICEPT) 10 MG tablet Take 1 tablet (10 mg total) by mouth at bedtime. 90 tablet 3   gabapentin (NEURONTIN) 300 MG capsule Take 300 mg by mouth at bedtime as needed (pain).     MELATONIN PO Take 2 tablets by mouth at bedtime.     methocarbamol (ROBAXIN) 500 MG tablet Take 1 tablet (500 mg total) by mouth every 6 (six) hours as needed for muscle spasms. 40 tablet 3   omeprazole (PRILOSEC OTC) 20 MG tablet Take 20 mg by  mouth in the morning.     Polyvinyl Alcohol-Povidone PF (REFRESH) 1.4-0.6 % SOLN Place 1 drop into both eyes daily as needed (luberication and dry eyes).     rivaroxaban (XARELTO) 20 MG TABS tablet Take 20 mg by mouth daily with supper.     rosuvastatin (CRESTOR) 5 MG tablet Take 1 tablet (5 mg total) by mouth daily. 90 tablet 3      Home: Home Living Family/patient expects to be discharged to:: Private residence Living Arrangements: Spouse/significant other Available Help at Discharge: Family, Available 24 hours/day Type of Home: House Home Access: Stairs to enter (threshold) Technical brewer of Steps: threshold Entrance Stairs-Rails: None Home Layout: One level (has a bonus room upstairs) Bathroom Shower/Tub: Multimedia programmer: Handicapped height Bathroom Accessibility: Yes Home Equipment: Conservation officer, nature (2 wheels), Civil engineer, contracting - built in  Lives With: Spouse   Functional History: Prior Function Prior Level of Function : Needs assist Mobility Comments: patient independent with mobility per report ADLs Comments: patient independent per patient report  Functional Status:  Mobility: Bed Mobility Overal bed mobility: Needs  Assistance Bed Mobility: Rolling, Sidelying to Sit Rolling: Max assist Sidelying to sit: Max assist General bed mobility comments: Pt received sitting EOB with family at bedside Transfers Overall transfer level: Needs assistance Equipment used: None Transfers: Sit to/from Stand, Bed to chair/wheelchair/BSC Sit to Stand: Supervision General transfer comment: Pt initially had trouble completing sit>stand, was able to perform movement much smoother throughout session. Chair t/f close supervision no AD Ambulation/Gait Ambulation/Gait assistance: Mod assist, +2 safety/equipment Gait Distance (Feet): 12 Feet (&20') Assistive device: Ethelene Hal Gait Pattern/deviations: Step-to pattern, Step-through pattern, Wide base of support, Trunk flexed, Decreased dorsiflexion - right, Decreased dorsiflexion - left, Decreased stride length General Gait Details: assist to guide walker and for forward momentum with very slight foot clearance; sat in chair in room and then stood again with RN pushing recliner behind    ADL: ADL Overall ADL's : Needs assistance/impaired Eating/Feeding: Independent, Sitting Eating/Feeding Details (indicate cue type and reason): in recliner Grooming: Standing, Supervision/safety, Wash/dry face Grooming Details (indicate cue type and reason): Washed face standing at sink close Supervision, combed hair sitting in chair. While maintaining sternal precautions Upper Body Bathing: Set up, Supervision/ safety, Sitting Upper Body Bathing Details (indicate cue type and reason): in recliner Lower Body Bathing: Minimal assistance, Sit to/from stand Lower Body Bathing Details (indicate cue type and reason): min A +2 sit<>stand Upper Body Dressing : Minimal assistance, Standing, Cueing for UE precautions Upper Body Dressing Details (indicate cue type and reason): Min A to don gown like a jacket, and Min A to don his jacket due to difficulty locating sleeves. Cueing needed to maintain  precautions and OT demonstrated donning regular t-shirts while maintaining precautions Lower Body Dressing: Supervision/safety, Sitting/lateral leans Lower Body Dressing Details (indicate cue type and reason): Pt doffed/donned bilat socks sitting in chair, figure four technique and no pain Toilet Transfer: Supervision/safety, Ambulation Toilet Transfer Details (indicate cue type and reason): No AD, 1 LOB d/t sloped bathroom entrance. Pt was able to self recover Toileting- Clothing Manipulation and Hygiene: Supervision/safety, Sit to/from stand Toileting - Clothing Manipulation Details (indicate cue type and reason): min A +2 sit<>stand Functional mobility during ADLs: Supervision/safety General ADL Comments: no AD, room level ambulation  Cognition: Cognition Overall Cognitive Status: Within Functional Limits for tasks assessed Orientation Level: Oriented X4 Cognition Arousal/Alertness: Awake/alert Behavior During Therapy: WFL for tasks assessed/performed Overall Cognitive Status: Within Functional Limits for tasks assessed Area  of Impairment: Attention, Following commands, Safety/judgement, Memory, Awareness, Problem solving Current Attention Level: Focused Memory: Decreased short-term memory Following Commands: Follows multi-step commands consistently Safety/Judgement: Decreased awareness of safety, Decreased awareness of deficits Awareness: Emergent Problem Solving: Difficulty sequencing, Requires verbal cues, Requires tactile cues General Comments: had pre-med prior to chest tube out  Physical Exam: Blood pressure 128/82, pulse 95, temperature 98.1 F (36.7 C), temperature source Oral, resp. rate 18, height '5\' 8"'$  (1.727 m), weight 71.8 kg, SpO2 99 %. Physical Exam Constitutional:      General: He is not in acute distress. HENT:     Head: Normocephalic and atraumatic.  Eyes:     Extraocular Movements: Extraocular movements intact.     Pupils: Pupils are equal, round, and  reactive to light.     Comments: Wearing glasses  Cardiovascular:     Rate and Rhythm: Normal rate and regular rhythm.  Pulmonary:     Effort: Pulmonary effort is normal.     Breath sounds: Normal breath sounds.  Abdominal:     General: Bowel sounds are normal.     Palpations: Abdomen is soft.  Musculoskeletal:     Right lower leg: No edema.     Left lower leg: No edema.  Skin:    Comments: Sternal incision healing without signs of infection  Neurological:     General: No focal deficit present.     Mental Status: He is alert and oriented to person, place, and time.  Psychiatric:        Mood and Affect: Mood normal.        Behavior: Behavior normal.     Results for orders placed or performed during the hospital encounter of 08/04/22 (from the past 48 hour(s))  Basic metabolic panel     Status: Abnormal   Collection Time: 08/09/22  1:27 AM  Result Value Ref Range   Sodium 139 135 - 145 mmol/L   Potassium 3.7 3.5 - 5.1 mmol/L   Chloride 101 98 - 111 mmol/L   CO2 30 22 - 32 mmol/L   Glucose, Bld 119 (H) 70 - 99 mg/dL    Comment: Glucose reference range applies only to samples taken after fasting for at least 8 hours.   BUN 15 8 - 23 mg/dL   Creatinine, Ser 0.94 0.61 - 1.24 mg/dL   Calcium 9.2 8.9 - 10.3 mg/dL   GFR, Estimated >60 >60 mL/min    Comment: (NOTE) Calculated using the CKD-EPI Creatinine Equation (2021)    Anion gap 8 5 - 15    Comment: Performed at [facility] [address], [place], [place] [address]   No results found.    Blood pressure 128/82, pulse 95, temperature 98.1 F (36.7 C), temperature source Oral, resp. rate 18, height '5\' 8"'$  (1.727 m), weight 71.8 kg, SpO2 99 %.  Medical Problem List and Plan: 1. Functional deficits secondary to ***  -patient may *** shower  -ELOS/Goals: ***  2.  Antithrombotics: -DVT/anticoagulation:  Pharmaceutical: Xarelto  -antiplatelet therapy: aspirin 81 mg daily  3. Pain Management: Tylenol as  needed  4. Mood/Behavior/Sleep: LCSW to evaluate and provide emotional support  -antipsychotic agents: n/a  5. Neuropsych/cognition: This patient is capable of making decisions on his own behalf.  6. Skin/Wound Care: Routine skin care checks   7. Fluids/Electrolytes/Nutrition: Routine Is and Os and follow-up chemistries  8: Hypertension: monitor TID and prn; on Lopressor 12.5 mg BID  9: Hyperlipidemia: continue statin  10: CAD s/p CABG  -  sternal precautions  -continue Lopressor 12.5 mg BID, aspirin 81 mg daily, statin  11: History of PE on Xarelto  12: Multiple prior spine surgeries. Last in October 2023 Dr. Ellene Route (lumbar)  13: History of Parkinson's disease: Continue Sinemet and Aricept  ***  Barbie Banner, PA-C 08/10/2022

## 2022-08-10 NOTE — Progress Notes (Signed)
Inpatient Rehab Admissions Coordinator:  There is a bed available for pt in CIR today. Erin Barrett, PA-C is aware and in agreement. Pt, pt's wife, NSG, and TOC made aware.   Gayland Curry, [place], [place] Admissions Coordinator [phone]

## 2022-08-10 NOTE — Progress Notes (Signed)
Occupational Therapy Treatment Patient Details Name: Terry [name] MRN: [id number] DOB: 1950-06-28 Today's Date: 08/10/2022   History of present illness Patient is a 72 y/o male admitted 08/03/21 following CABG x 3.  PMH positive for prostate CA, HLD, hay fever, GERD, chronic back pain, asthma, R achilles tendon repair, B knee arthroscopy,R finger surgery, cardiac cath 2004, ACDF 2016    Patient is a 73 yo male presenting to hospital for L2-L3 fusion on 03/24/22. PMH includes: L3-L5 fusion, Parkinsons, prostate CA, HLD, hay fever, GERD, chronic back pain, asthma, R achilles tendon repair.   OT comments  Pt progressing towards Pt focused goals. Pt currently ambulating around the room with close Supervision and no AD. Educated /reviewed Pt on sternal precautions, need Min cueing to maintain precautions during functional activities. Pt needed Min A to don jackets while maintaining precautions, able to don/doff socks with figure four technique. Pt would benefit from continued functional activities that challenge use of UE WFL while maintaining precautions, and address mild strength and balance deficits. Pt would  benefit from continued skilled acute OT services to address above deficits and transition to next level of care. CIR following Pt for admission at this time. If Pt continues to progress he would benefit from home health services with intermittent supervision.   Recommendations for follow up therapy are one component of a multi-disciplinary discharge planning process, led by the attending physician.  Recommendations may be updated based on patient status, additional functional criteria and insurance authorization.    Follow Up Recommendations  Acute inpatient rehab (3hours/day)     Assistance Recommended at Discharge Intermittent Supervision/Assistance  Patient can return home with the following  A little help with walking and/or transfers;A little help with bathing/dressing/bathroom;Assistance  with cooking/housework;Assist for transportation   Equipment Recommendations  Other (comment) (TBD at next level of care)    Recommendations for Other Services      Precautions / Restrictions Precautions Precautions: Sternal;Fall Restrictions Weight Bearing Restrictions: Yes RUE Weight Bearing:  (sternal precautions) LUE Weight Bearing:  (sternal precautions)       Mobility Bed Mobility               General bed mobility comments: Pt received sitting EOB with family at bedside    Transfers Overall transfer level: Needs assistance Equipment used: None Transfers: Sit to/from Stand, Bed to chair/wheelchair/BSC Sit to Stand: Supervision           General transfer comment: Pt initially had trouble completing sit>stand, was able to perform movement much smoother throughout session. Chair t/f close supervision no AD     Balance Overall balance assessment: Needs assistance, Mild deficits observed, not formally tested Sitting-balance support: Feet supported, No upper extremity supported Sitting balance-Leahy Scale: Normal       Standing balance-Leahy Scale: Good Standing balance comment: Mild balance unsteadiness                           ADL either performed or assessed with clinical judgement   ADL Overall ADL's : Needs assistance/impaired     Grooming: Standing;Supervision/safety;Wash/dry face Grooming Details (indicate cue type and reason): Washed face standing at sink close Supervision, combed hair sitting in chair. While maintaining sternal precautions         Upper Body Dressing : Minimal assistance;Standing;Cueing for UE precautions Upper Body Dressing Details (indicate cue type and reason): Min A to don gown like a jacket, and Min A to don his jacket  due to difficulty locating sleeves. Cueing needed to maintain precautions and OT demonstrated donning regular t-shirts while maintaining precautions Lower Body Dressing:  Supervision/safety;Sitting/lateral leans Lower Body Dressing Details (indicate cue type and reason): Pt doffed/donned bilat socks sitting in chair, figure four technique and no pain Toilet Transfer: Supervision/safety;Ambulation Toilet Transfer Details (indicate cue type and reason): No AD, 1 LOB d/t sloped bathroom entrance. Pt was able to self recover Toileting- Clothing Manipulation and Hygiene: Supervision/safety;Sit to/from stand       Functional mobility during ADLs: Supervision/safety General ADL Comments: no AD, room level ambulation    Extremity/Trunk Assessment Upper Extremity Assessment Upper Extremity Assessment: Overall WFL for tasks assessed RUE Deficits / Details: AROM WFL with sternal precautions LUE Deficits / Details: AROM WFL with sternal precautions   Lower Extremity Assessment Lower Extremity Assessment: Defer to PT evaluation   Cervical / Trunk Assessment Cervical / Trunk Assessment: Normal    Vision Baseline Vision/History: 1 Wears glasses Patient Visual Report: No change from baseline Vision Assessment?: No apparent visual deficits   Perception Perception Perception: Not tested   Praxis Praxis Praxis: Intact    Cognition Arousal/Alertness: Awake/alert Behavior During Therapy: WFL for tasks assessed/performed Overall Cognitive Status: Within Functional Limits for tasks assessed                     Current Attention Level: Focused   Following Commands: Follows multi-step commands consistently                Exercises      Shoulder Instructions       General Comments      Pertinent Vitals/ Pain       Pain Assessment Pain Assessment: No/denies pain  Home Living                                          Prior Functioning/Environment              Frequency  Min 2X/week        Progress Toward Goals  OT Goals(current goals can now be found in the care plan section)  Progress towards OT goals:  Progressing toward goals  Acute Rehab OT Goals Patient Stated Goal: To get strong enough to return home OT Goal Formulation: With patient Time For Goal Achievement: 08/21/22 Potential to Achieve Goals: Good  Plan Frequency remains appropriate;Discharge plan remains appropriate    Co-evaluation                 AM-PAC OT "6 Clicks" Daily Activity     Outcome Measure   Help from another person eating meals?: None Help from another person taking care of personal grooming?: A Little Help from another person toileting, which includes using toliet, bedpan, or urinal?: A Little Help from another person bathing (including washing, rinsing, drying)?: A Little Help from another person to put on and taking off regular upper body clothing?: A Little Help from another person to put on and taking off regular lower body clothing?: A Little 6 Click Score: 19    End of Session Equipment Utilized During Treatment: Gait belt  OT Visit Diagnosis: Unsteadiness on feet (R26.81);Other abnormalities of gait and mobility (R26.89);Muscle weakness (generalized) (M62.81);Other symptoms and signs involving cognitive function   Activity Tolerance Patient tolerated treatment well   Patient Left in chair;with call bell/phone within reach   Nurse Communication Mobility  status;Precautions (sternal precautions)        Time: [id number] OT Time Calculation (min): 28 min  Charges: OT General Charges $OT Visit: 1 Visit OT Treatments $Self Care/Home Management : 8-22 mins $Therapeutic Activity: 8-22 mins  08/10/2022  AB, OTR/L  Acute Rehabilitation Services  Office: [phone]   Cori Razor 08/10/2022, 2:26 PM

## 2022-08-10 NOTE — Progress Notes (Signed)
Physical Therapy Treatment Patient Details Name: Terry [name] MRN: [id number] DOB: Jan 25, 1951 Today's Date: 08/10/2022   History of Present Illness Patient is a 72 y/o male admitted 08/03/21 following CABG x 3.  PMH: prostate CA, HLD, hay fever, GERD, chronic back pain, asthma, R achilles tendon repair, B knee arthroscopy, R finger surgery, cardiac cath 2004, ACDF 2016, L2-L3 fusion on 03/24/22, L3-5 fusion, Parkinsons.    PT Comments    Pt received in chair, agreeable to therapy session and with good participation and tolerance for reciprocal transfer training and standing balance/exercise instruction. Pt c/o fogginess in thinking/processing info and had some difficulty following instructions for standing balance tasks, needing increased time and visual/verbal demo for proper technique. Pt with significant balance deficit from baseline and currently safest to use RW for dynamic standing tasks on his own, but making good progress from previous session, needing up to minA for transfers and standing balance tasks without AD. Pt BP 102/63 (77) standing and BP 120/77 (90) reclined in chair. SpO2/HR WFL on RA. Pt continues to benefit from PT services to progress toward functional mobility goals.   Recommendations for follow up therapy are one component of a multi-disciplinary discharge planning process, led by the attending physician.  Recommendations may be updated based on patient status, additional functional criteria and insurance authorization.  Follow Up Recommendations  Acute inpatient rehab (3hours/day)     Assistance Recommended at Discharge Frequent or constant Supervision/Assistance  Patient can return home with the following Assistance with cooking/housework;Assist for transportation;Help with stairs or ramp for entrance;A little help with walking and/or transfers;A little help with bathing/dressing/bathroom   Equipment Recommendations  Other (comment) (TBD post-acute)     Recommendations for Other Services       Precautions / Restrictions Precautions Precautions: Sternal;Fall Precaution Comments: verbal review for Move in the Tube precautions Restrictions Weight Bearing Restrictions: No RUE Weight Bearing:  (sternal precautions) LUE Weight Bearing:  (sternal precautions)     Mobility  Bed Mobility               General bed mobility comments: Pt received sitting in chair, up in recliner at end of session.    Transfers Overall transfer level: Needs assistance Equipment used: None Transfers: Sit to/from Stand, Bed to chair/wheelchair/BSC Sit to Stand: Min assist, Min guard           General transfer comment: Initially stood with min guard, but with fatigue needing minA to rise. Pt performed STS x 5 reps without using arms with minA for some reps. Also stood x3 other reps (~8 reps total)    Ambulation/Gait Ambulation/Gait assistance: Min assist   Assistive device: None       Pre-gait activities: standing hip flexion x30 reps at bedside, pt neeidng intermittent minA, pt fatigued and needed seated break, then checked orthostatics and BP drop 18 points from sit>stand, so he may have been mildly orthostatic.       Balance Overall balance assessment: Needs assistance, Mild deficits observed, not formally tested Sitting-balance support: Feet supported, No upper extremity supported Sitting balance-Leahy Scale: Normal       Standing balance-Leahy Scale: Fair Standing balance comment: Mild balance unsteadiness, good with RW, poor to fair with no AD for dynamic standing tasks.     Tandem Stance - Right Leg: 15 (intermittent min guard until ~15 seconds, then pt with LOB requesting to sit) Tandem Stance - Left Leg:  (defer) Rhomberg - Eyes Opened: 30 Rhomberg - Eyes Closed:  (defer)  High Level Balance Comments: feet together, semi-tandem stances 30 sec ea leg, pt with increased postural sway, needed seated break prior to performing  full tandem stance as detailed above.            Cognition Arousal/Alertness: Awake/alert Behavior During Therapy: WFL for tasks assessed/performed Overall Cognitive Status: Impaired/Different from baseline                     Current Attention Level: Focused Memory: Decreased recall of precautions, Decreased short-term memory Following Commands: Follows one step commands consistently, Follows multi-step commands inconsistently Safety/Judgement: Decreased awareness of safety Awareness: Emergent Problem Solving: Difficulty sequencing, Requires verbal cues General Comments: Pt reports his thinking is not as clear as it typically is, and pt had difficulty understanding cues for novel tasks (standing balance tasks pt had difficulty placing feet in visual/verbally demonstrated manner) and with decreased safety while performing instructed tasks. Possible mild word finding difficulty vs fatigue.        Exercises Other Exercises Other Exercises: standing hip flexion x30 reps ea AROM unsupported (minA intermittently) Other Exercises: STS x 5 reps (arms crossed at chest from chair) intermittent minA Other Exercises: feet together, semi-tandem and full tandem stances ~30 sec ea    General Comments General comments (skin integrity, edema, etc.): BP 120/77 (90) recliner, BP 113/77 (88) sitting in chair, BP 103/63 (77) standing, no dizziness but c/o BLE fatigue/weakness after standing marches x30 sec. May have been mild orthostatic symptoms. HR 90's bpm SpO2 98% on RA.      Pertinent Vitals/Pain Pain Assessment Pain Assessment: Faces Faces Pain Scale: Hurts little more Pain Location: LLE incisions, sternal incision with transfers, low back from chair (pillows placed for improved comfort) Pain Descriptors / Indicators: Grimacing, Operative site guarding Pain Intervention(s): Monitored during session, Limited activity within patient's tolerance, Repositioned     PT Goals (current  goals can now be found in the care plan section) Acute Rehab PT Goals Patient Stated Goal: to return to independent PT Goal Formulation: With patient/family Time For Goal Achievement: 08/20/22 Progress towards PT goals: Progressing toward goals    Frequency    Min 3X/week      PT Plan Current plan remains appropriate       AM-PAC PT "6 Clicks" Mobility   Outcome Measure  Help needed turning from your back to your side while in a flat bed without using bedrails?: A Little Help needed moving from lying on your back to sitting on the side of a flat bed without using bedrails?: A Lot Help needed moving to and from a bed to a chair (including a wheelchair)?: A Little Help needed standing up from a chair using your arms (e.g., wheelchair or bedside chair)?: A Little Help needed to walk in hospital room?: A Little Help needed climbing 3-5 steps with a railing? : A Lot 6 Click Score: 16    End of Session Equipment Utilized During Treatment: Gait belt Activity Tolerance: Patient limited by fatigue;Patient tolerated treatment well;Other (comment) (defers gait due to fatigue after standing balance/exercise tasks and reciprocal transfers, also recently worked with OT, worked with [place] in AM) Patient left: in chair;with call bell/phone within Web designer Communication: Mobility status;Patient requests pain meds;Other (comment) (sternal incision and LLE pain) PT Visit Diagnosis: Other abnormalities of gait and mobility (R26.89);Muscle weakness (generalized) (M62.81)     Time: 1537-1600 PT Time Calculation (min) (ACUTE ONLY): 23 min  Charges:  $Therapeutic Exercise: 8-22 mins $Therapeutic Activity: 8-22  mins                     Houston Siren., PTA Acute Rehabilitation Services Secure Chat Preferred 9a-5:30pm Office: [place] 08/10/2022, 4:20 PM

## 2022-08-10 NOTE — Progress Notes (Signed)
Pt transferred to room 4W16. Belongings with pt. No further complaints.

## 2022-08-10 NOTE — H&P (Signed)
Physical Medicine and Rehabilitation Admission H&P       CC: Functional deficits secondary to CAD s/p CABG   HPI: Terry [name] is a 72 year old male who was evaluated by Dr. Gwenlyn Found on 07/17/2022 and had obtained a coronary CTA on 07/15/2022 revealing a coronary calcium score of 440 with significant disease in his RCA by FFR analysis.  Based on this, it was decided to proceed with outpatient radial diagnostic coronary angiography.  He was asked to hold his Xarelto 2 days before the procedure.  He was evaluated by Dr. Tenny Craw and scheduled for coronary artery bypass grafting.  On 08/04/2022, he underwent CABG x 3 vessels, clipping of atrial appendage and vein harvest from right lower extremity.  Transferred to cardiac ICU with labile blood pressure.  Extubated on 3/06.  Mild delirium 3/08.  Foley discontinued.  Multiple loose stools overnight and bowel regimen decreased.  Started on aspirin 81 mg daily.  Lovenox for DVT prophylaxis.  Tolerating diet.  Xarelto resumed 3/10. Kidney function and hemoglobin remained stable.  Requiring no oxygen supplementation.  Ambulated yesterday 450 feet with front wheel rolling walker.The patient requires inpatient medicine and rehabilitation evaluations and services for ongoing dysfunction secondary to coronary artery disease status post CABG. Review of Systems  Constitutional:  Negative for chills and fever.  HENT:  Positive for congestion. Negative for nosebleeds.   Eyes:  Negative for pain, discharge and redness.  Respiratory:  Negative for cough, hemoptysis and shortness of breath.   Cardiovascular:  Positive for chest pain and leg swelling.       Chest incisional pain with using RUE  Gastrointestinal:  Negative for heartburn, nausea and vomiting.  Genitourinary:  Negative for dysuria and hematuria.  Musculoskeletal:  Negative for falls and joint pain.  Skin:  Negative for itching and rash.  Neurological:  Negative for sensory change and focal weakness.   Endo/Heme/Allergies:  Bruises/bleeds easily.  Psychiatric/Behavioral:  Positive for memory loss. Negative for substance abuse.         Past Medical History:  Diagnosis Date   Arthritis      neck, back, knees, right ankle   Bladder calculus     Chronic back pain     Coronary artery disease     GERD (gastroesophageal reflux disease)     History of colon polyps      benign   History of kidney stones      and bladder stone   History of prostate cancer urologist-- dr Alinda Money    dx 2014---  s/p  prostatectomy 05-22-2013 ,  Gleason 3+3   Hypercholesterolemia      takes Crestor daily   Hyperlipidemia     Pneumonia 10/2019   Pulmonary emboli ([place]) 10/2019   Wears glasses           Past Surgical History:  Procedure Laterality Date   ACHILLES TENDON SURGERY Right 06-07-2018   '@[facility]'$     reconstruction   ANTERIOR CERVICAL DECOMP/DISCECTOMY FUSION N/A 06/27/2014    Procedure: ANTERIOR CERVICAL DECOMPRESSION/DISCECTOMY FUSION C5-C7   (2 LEVELS);  Surgeon: Melina Schools, MD;  Location: [facility];  Service: Orthopedics;  Laterality: N/A;   back fusion   03/2022   CARDIAC CATHETERIZATION   2004   CLIPPING OF ATRIAL APPENDAGE Left 08/04/2022    Procedure: CLIPPING OF ATRIAL APPENDAGE USING AN ATRICURE 12 ATRICLIP;  Surgeon: Neomia Glass, MD;  Location: [facility];  Service: Open Heart Surgery;  Laterality: Left;   COLONOSCOPY  CORONARY ARTERY BYPASS GRAFT N/A 08/04/2022    Procedure: CORONARY ARTERY BYPASS GRAFTING (CABG) X THREE USING LEFT INTERNAL MAMMARY ARTERY (LIMA) AND ENDOSCOPICALLY HARVESTED RIGHT GREATER SAPHENOUS VEIN.;  Surgeon: Neomia Glass, MD;  Location: [facility];  Service: Open Heart Surgery;  Laterality: N/A;   CYSTOSCOPY WITH LITHOLAPAXY N/A 03/27/2019    Procedure: CYSTOSCOPY WITH REMOVAL OF BLADDER STONE AND FOREIGN BODY;  Surgeon: Raynelle Bring, MD;  Location: [facility];  Service: Urology;  Laterality: N/A;   EYE SURGERY        lazy eye as child, lasik surgery    FINGER SURGERY Right      4th finger   I & D EXTREMITY Right 07/10/2018    Procedure: IRRIGATION AND DEBRIDEMENT RIGHT ANKLE WOUND AND WOUND VAC PLACEMENT;  Surgeon: Wylene Simmer, MD;  Location: [facility];  Service: Orthopedics;  Laterality: Right;   KNEE ARTHROSCOPY Bilateral right ?;  left 05-25-2007  '@[facility]'$    LEFT HEART CATH AND CORONARY ANGIOGRAPHY N/A 07/20/2022    Procedure: LEFT HEART CATH AND CORONARY ANGIOGRAPHY;  Surgeon: Lorretta Harp, MD;  Location: [facility];  Service: Cardiovascular;  Laterality: N/A;   POSTERIOR LAMINECTOMY / DECOMPRESSION LUMBAR SPINE   08-31-2016    dr elsner  '@[facility]'$    PROSTATE BIOPSY   04/13/2013    gleason 3+3=6, vol 55.4 cc   ROBOT ASSISTED LAPAROSCOPIC RADICAL PROSTATECTOMY N/A 05/22/2013    Procedure: ROBOTIC ASSISTED LAPAROSCOPIC RADICAL PROSTATECTOMY LEVEL 1;  Surgeon: Dutch Gray, MD;  Location: [facility];  Service: Urology;  Laterality: N/A;   TEE WITHOUT CARDIOVERSION N/A 08/04/2022    Procedure: TRANSESOPHAGEAL ECHOCARDIOGRAM;  Surgeon: Neomia Glass, MD;  Location: [facility];  Service: Open Heart Surgery;  Laterality: N/A;   TONSILLECTOMY             Family History  Problem Relation Age of Onset   Pneumonia Mother          bacterial   Dementia Mother     Heart disease Father     Heart attack Father     Colon cancer Neg Hx     Sleep apnea Neg Hx      Social History:  reports that he has never smoked. He has never used smokeless tobacco. He reports current alcohol use. He reports that he does not use drugs. Allergies:       Allergies  Allergen Reactions   Lipitor [Atorvastatin] Other (See Comments)      Muscle issues   Penicillins Hives, Itching and Other (See Comments)      Has patient had a PCN reaction causing immediate rash, facial/tongue/throat swelling, SOB or lightheadedness with hypotension: No Has patient had a PCN reaction causing SEVERE RASH INVOLVING MUCUS MEMBRANES or SKIN NECROSIS: #  #  #  YES  #  #  #  Has patient had a  PCN reaction that required hospitalization No Has patient had a PCN reaction occurring within the last 10 years: No            Medications Prior to Admission  Medication Sig Dispense Refill   acetaminophen (TYLENOL) 500 MG tablet Take 500 mg by mouth daily.       aspirin EC 81 MG tablet Take 1 tablet (81 mg total) by mouth daily. Swallow whole.       carbidopa-levodopa (SINEMET IR) 25-100 MG tablet Take 1 tablet by mouth 3 (three) times daily. 270 tablet 3   cetirizine (ZYRTEC) 10 MG tablet  Take 10 mg by mouth daily.       cyanocobalamin (VITAMIN B12) 1000 MCG tablet Take 1,000 mcg by mouth daily.       donepezil (ARICEPT) 10 MG tablet Take 1 tablet (10 mg total) by mouth at bedtime. 90 tablet 3   gabapentin (NEURONTIN) 300 MG capsule Take 300 mg by mouth at bedtime as needed (pain).       MELATONIN PO Take 2 tablets by mouth at bedtime.       methocarbamol (ROBAXIN) 500 MG tablet Take 1 tablet (500 mg total) by mouth every 6 (six) hours as needed for muscle spasms. 40 tablet 3   omeprazole (PRILOSEC OTC) 20 MG tablet Take 20 mg by mouth in the morning.       Polyvinyl Alcohol-Povidone PF (REFRESH) 1.4-0.6 % SOLN Place 1 drop into both eyes daily as needed (luberication and dry eyes).       rivaroxaban (XARELTO) 20 MG TABS tablet Take 20 mg by mouth daily with supper.       rosuvastatin (CRESTOR) 5 MG tablet Take 1 tablet (5 mg total) by mouth daily. 90 tablet 3          Home: Home Living Family/patient expects to be discharged to:: Private residence Living Arrangements: Spouse/significant other Available Help at Discharge: Family, Available 24 hours/day Type of Home: House Home Access: Stairs to enter (threshold) Technical brewer of Steps: threshold Entrance Stairs-Rails: None Home Layout: One level (has a bonus room upstairs) Bathroom Shower/Tub: Multimedia programmer: Handicapped height Bathroom Accessibility: Yes Home Equipment: Conservation officer, nature (2 wheels),  Civil engineer, contracting - built in  Lives With: Spouse   Functional History: Prior Function Prior Level of Function : Needs assist Mobility Comments: patient independent with mobility per report ADLs Comments: patient independent per patient report   Functional Status:  Mobility: Bed Mobility Overal bed mobility: Needs Assistance Bed Mobility: Rolling, Sidelying to Sit Rolling: Max assist Sidelying to sit: Max assist General bed mobility comments: Pt received sitting EOB with family at bedside Transfers Overall transfer level: Needs assistance Equipment used: None Transfers: Sit to/from Stand, Bed to chair/wheelchair/BSC Sit to Stand: Supervision General transfer comment: Pt initially had trouble completing sit>stand, was able to perform movement much smoother throughout session. Chair t/f close supervision no AD Ambulation/Gait Ambulation/Gait assistance: Mod assist, +2 safety/equipment Gait Distance (Feet): 12 Feet (&20') Assistive device: Ethelene Hal Gait Pattern/deviations: Step-to pattern, Step-through pattern, Wide base of support, Trunk flexed, Decreased dorsiflexion - right, Decreased dorsiflexion - left, Decreased stride length General Gait Details: assist to guide walker and for forward momentum with very slight foot clearance; sat in chair in room and then stood again with RN pushing recliner behind   ADL: ADL Overall ADL's : Needs assistance/impaired Eating/Feeding: Independent, Sitting Eating/Feeding Details (indicate cue type and reason): in recliner Grooming: Standing, Supervision/safety, Wash/dry face Grooming Details (indicate cue type and reason): Washed face standing at sink close Supervision, combed hair sitting in chair. While maintaining sternal precautions Upper Body Bathing: Set up, Supervision/ safety, Sitting Upper Body Bathing Details (indicate cue type and reason): in recliner Lower Body Bathing: Minimal assistance, Sit to/from stand Lower Body Bathing Details  (indicate cue type and reason): min A +2 sit<>stand Upper Body Dressing : Minimal assistance, Standing, Cueing for UE precautions Upper Body Dressing Details (indicate cue type and reason): Min A to don gown like a jacket, and Min A to don his jacket due to difficulty locating sleeves. Cueing needed to maintain precautions and OT  demonstrated donning regular t-shirts while maintaining precautions Lower Body Dressing: Supervision/safety, Sitting/lateral leans Lower Body Dressing Details (indicate cue type and reason): Pt doffed/donned bilat socks sitting in chair, figure four technique and no pain Toilet Transfer: Supervision/safety, Ambulation Toilet Transfer Details (indicate cue type and reason): No AD, 1 LOB d/t sloped bathroom entrance. Pt was able to self recover Toileting- Clothing Manipulation and Hygiene: Supervision/safety, Sit to/from stand Toileting - Clothing Manipulation Details (indicate cue type and reason): min A +2 sit<>stand Functional mobility during ADLs: Supervision/safety General ADL Comments: no AD, room level ambulation   Cognition: Cognition Overall Cognitive Status: Within Functional Limits for tasks assessed Orientation Level: Oriented X4 Cognition Arousal/Alertness: Awake/alert Behavior During Therapy: WFL for tasks assessed/performed Overall Cognitive Status: Within Functional Limits for tasks assessed Area of Impairment: Attention, Following commands, Safety/judgement, Memory, Awareness, Problem solving Current Attention Level: Focused Memory: Decreased short-term memory Following Commands: Follows multi-step commands consistently Safety/Judgement: Decreased awareness of safety, Decreased awareness of deficits Awareness: Emergent Problem Solving: Difficulty sequencing, Requires verbal cues, Requires tactile cues General Comments: had pre-med prior to chest tube out   Physical Exam: Blood pressure 128/82, pulse 95, temperature 98.1 F (36.7 C), temperature  source Oral, resp. rate 18, height '5\' 8"'$  (1.727 m), weight 71.8 kg, SpO2 99 %. Physical Exam  General: No acute distress Mood and affect are appropriate Heart: Regular rate and rhythm no rubs murmurs or extra sounds Lungs: Clear to auscultation, breathing unlabored, no rales or wheezes Abdomen: Positive bowel sounds, soft nontender to palpation, nondistended Extremities: No clubbing, cyanosis, or edema Skin: No evidence of breakdown, no evidence of rash, sternotomy incision CDI, R saphenous vein graft incision CDI Neurologic: Cranial nerves II through XII intact, motor strength is 5/5 in bilateral deltoid, bicep, tricep, grip, 4/5 hip flexor, knee extensors, ankle dorsiflexor and plantar flexor Sensory exam normal sensation to light touch  in bilateral upper and lower extremities Orientation to person , to [facility] but not specific floor, not to time "Feb" "2424" Musculoskeletal: Full range of motion in all 4 extremities. No joint swelling    Lab Results Last 48 Hours        Results for orders placed or performed during the hospital encounter of 08/04/22 (from the past 48 hour(s))  Basic metabolic panel     Status: Abnormal    Collection Time: 08/09/22  1:27 AM  Result Value Ref Range    Sodium 139 135 - 145 mmol/L    Potassium 3.7 3.5 - 5.1 mmol/L    Chloride 101 98 - 111 mmol/L    CO2 30 22 - 32 mmol/L    Glucose, Bld 119 (H) 70 - 99 mg/dL      Comment: Glucose reference range applies only to samples taken after fasting for at least 8 hours.    BUN 15 8 - 23 mg/dL    Creatinine, Ser 0.94 0.61 - 1.24 mg/dL    Calcium 9.2 8.9 - 10.3 mg/dL    GFR, Estimated >60 >60 mL/min      Comment: (NOTE) Calculated using the CKD-EPI Creatinine Equation (2021)      Anion gap 8 5 - 15      Comment: Performed at [facility] [address] [place], [place] [address]      Imaging Results (Last 48 hours)  No results found.         Blood pressure 128/82, pulse 95, temperature 98.1  F (36.7 C), temperature source Oral, resp. rate 18, height 5'  8" (1.727 m), weight 71.8 kg, SpO2 99 %.   Medical Problem List and Plan: 1. Functional deficits secondary to debility             -patient may  shower             -ELOS/Goals: 7-10d ModI/Sup goals   2.  Antithrombotics: -DVT/anticoagulation:  Pharmaceutical: Xarelto             -antiplatelet therapy: aspirin 81 mg daily   3. Pain Management: Tylenol as needed   4. Mood/Behavior/Sleep: LCSW to evaluate and provide emotional support             -antipsychotic agents: n/a   5. Neuropsych/cognition: This patient is capable of making decisions on his own behalf with assistance for complex decisions.   6. Skin/Wound Care: Routine skin care checks   7. Fluids/Electrolytes/Nutrition: Routine Is and Os and follow-up chemistries   8: Hypertension: monitor TID and prn; on Lopressor 12.5 mg BID   9: Hyperlipidemia: continue statin   10: CAD s/p CABG             -sternal precautions             -continue Lopressor 12.5 mg BID   11: History of PE on Xarelto   12: Multiple prior spine surgeries. Last in October 2023 Dr. Ellene Route (lumbar)   13: History of Parkinson's disease: Continue Sinemet and Aricept     Barbie Banner, PA-C 08/10/2022 "I have personally performed a face to face diagnostic evaluation of this patient.  Additionally, I have reviewed and concur with the physician assistant's documentation above."  Charlett Blake M.D. [facility] Fellow Am Acad of Phys Med and Rehab Diplomate Am Board of Electrodiagnostic Med Fellow Am Board of Interventional Pain

## 2022-08-10 NOTE — Progress Notes (Signed)
Signed     PMR Admission Coordinator Pre-Admission Assessment   Patient: Terry [name] is an 72 y.o., male MRN: [id number] DOB: [birth year] Height: '5\' 8"'$  (172.7 cm) Weight: 71.8 kg   Insurance Information HMO:     PPO:      PCP:      IPA:      80/20: yes     OTHER:  PRIMARY: Medicare A & B      Policy#: [id number]      Subscriber: patient CM Name:       Phone#:      Fax#:  Pre-Cert#:       Employer:  Benefits:  Phone #: verified eligibility via [place] on 08/08/22     Name:  Eff. Date: Part A effective 01/30/14, Part B effective 04/01/16     Deduct: $1,632      Out of Pocket Max: NA      Life Max: NA CIR: 100% coverage      SNF: 100% coverage days 1-20, 80% coverage days 21-100 Outpatient: 80% coverage     Co-Pay: 20%  Home Health: 100% coverage      Co-Pay:  DME: 80% coverage     Co-Pay: 20% Providers: pt's choice SECONDARY: BCBS Supplement      Policy#: [id number]     Phone#: [phone]   Financial Counselor:       Phone#:    The "Data Collection Information Summary" for patients in Inpatient Rehabilitation Facilities with attached "Privacy Act [place] Records" was provided and verbally reviewed with: Patient and Family   Emergency Contact Information Contact Information       Name Relation Home Work Mobile    [place] Spouse [phone]   [phone]    Tami Ribas Daughter     [phone]           Current Medical History  Patient Admitting Diagnosis: s/p CABG x3 History of Present Illness: Pt is a 72 year old male with medical hx significant for: PE (5 years ago), CAD, multiple back surgeries, GERD, h/o colon polyps, h/o prostate CA, h/o kidney stones, PNA, hypercholesterolemia.Coronary CTA on 07/15/22 revealed coronary calcium score of 440 with significant disease in RCA. Heart cath on 07/20/22 showed 95% proximal LAD stensosis and 90% proximal RCA stenosis. Cardiothoracic surgeon recommended radial diagnostic coronary angiography. Pt presented  to [facility] on 08/04/22 for scheduled CABG x3 by Dr. Tenny Craw. Pt extubated to 4L [place] on 08/05/22. Chest x-ray showed left pleural effusion. Therapy evaluations completed and CIR recommended d/t pt's deficits in functional mobility and inability to complete ADLs independently.    Patient's medical record from [facility] has been reviewed by the rehabilitation admission coordinator and physician.   Past Medical History      Past Medical History:  Diagnosis Date   Arthritis      neck, back, knees, right ankle   Bladder calculus     Chronic back pain     Coronary artery disease     GERD (gastroesophageal reflux disease)     History of colon polyps      benign   History of kidney stones      and bladder stone   History of prostate cancer urologist-- dr Alinda Money    dx 2014---  s/p  prostatectomy 05-22-2013 ,  Gleason 3+3   Hypercholesterolemia      takes Crestor daily   Hyperlipidemia     Pneumonia 10/2019   Pulmonary emboli ([place]) 10/2019   Wears glasses  Has the patient had major surgery during 100 days prior to admission? Yes   Family History   family history includes Dementia in his mother; Heart attack in his father; Heart disease in his father; Pneumonia in his mother.   Current Medications   Current Facility-Administered Medications:    acetaminophen (TYLENOL) tablet 650 mg, 650 mg, Oral, Q6H PRN, Enter, Pierre Bali, MD   aspirin EC tablet 81 mg, 81 mg, Oral, Daily, Melrose Nakayama, MD, 81 mg at 08/10/22 0849   bisacodyl (DULCOLAX) EC tablet 10 mg, 10 mg, Oral, Daily PRN **OR** bisacodyl (DULCOLAX) suppository 10 mg, 10 mg, Rectal, Daily PRN, Barrett, Erin R, PA-C   carbidopa-levodopa (SINEMET IR) 25-100 MG per tablet immediate release 1 tablet, 1 tablet, Oral, TID, Barrett, Erin R, PA-C, 1 tablet at 08/10/22 0853   docusate sodium (COLACE) capsule 200 mg, 200 mg, Oral, BID PRN, Barrett, Erin R, PA-C   donepezil (ARICEPT) tablet 10 mg, 10 mg, Oral, QHS,  Barrett, Erin R, PA-C, 10 mg at 08/09/22 2108   loratadine (CLARITIN) tablet 10 mg, 10 mg, Oral, Daily, Barrett, Erin R, PA-C, 10 mg at 08/10/22 0853   melatonin tablet 3 mg, 3 mg, Oral, QHS PRN, Melrose Nakayama, MD, 3 mg at 08/09/22 2300   methocarbamol (ROBAXIN) tablet 500 mg, 500 mg, Oral, Q6H PRN, Melrose Nakayama, MD   metoprolol tartrate (LOPRESSOR) tablet 12.5 mg, 12.5 mg, Oral, BID, Enter, Pierre Bali, MD, 12.5 mg at 08/10/22 0849   ondansetron (ZOFRAN) injection 4 mg, 4 mg, Intravenous, Q6H PRN, Barrett, Erin R, PA-C   Oral care mouth rinse, 15 mL, Mouth Rinse, PRN, Enter, Pierre Bali, MD   oxyCODONE (Oxy IR/ROXICODONE) immediate release tablet 5-10 mg, 5-10 mg, Oral, Q3H PRN, Barrett, Erin R, PA-C, 5 mg at 08/07/22 1007   pantoprazole (PROTONIX) EC tablet 40 mg, 40 mg, Oral, Daily, Enter, Pierre Bali, MD, 40 mg at 08/10/22 0849   polyvinyl alcohol (LIQUIFILM TEARS) 1.4 % ophthalmic solution 1 drop, 1 drop, Both Eyes, PRN, Enter, Pierre Bali, MD   rivaroxaban (XARELTO) tablet 20 mg, 20 mg, Oral, Q supper, Melrose Nakayama, MD, 20 mg at 08/09/22 1659   rosuvastatin (CRESTOR) tablet 5 mg, 5 mg, Oral, Daily, Agarwala, Ravi, MD, 5 mg at 08/10/22 0849   sodium chloride flush (NS) 0.9 % injection 3 mL, 3 mL, Intravenous, Q12H, Barrett, Erin R, PA-C, 3 mL at 08/10/22 0850   sodium chloride flush (NS) 0.9 % injection 3 mL, 3 mL, Intravenous, PRN, Barrett, Erin R, PA-C   Patients Current Diet:  Diet Order                  Diet Carb Modified Fluid consistency: Thin; Room service appropriate? Yes with Assist  Diet effective now                         Precautions / Restrictions Precautions Precautions: Sternal, Fall Restrictions Weight Bearing Restrictions: Yes RUE Weight Bearing:  (sternal precautions) LUE Weight Bearing: Weight bearing as tolerated    Has the patient had 2 or more falls or a fall with injury in the past year? No   Prior Activity Level Community (5-7x/wk):  gets out of house daily, drives   Prior Functional Level Self Care: Did the patient need help bathing, dressing, using the toilet or eating? Independent   Indoor Mobility: Did the patient need assistance with walking from room to room (with or without device)? Independent  Stairs: Did the patient need assistance with internal or external stairs (with or without device)? Independent   Functional Cognition: Did the patient need help planning regular tasks such as shopping or remembering to take medications? Independent   Patient Information Are you of Hispanic, Latino/a,or Spanish origin?: A. No, not of Hispanic, Latino/a, or Spanish origin What is your race?: A. White Do you need or want an interpreter to communicate with a doctor or health care staff?: 0. No   Patient's Response To:  Health Literacy and Transportation Is the patient able to respond to health literacy and transportation needs?: Yes Health Literacy - How often do you need to have someone help you when you read instructions, pamphlets, or other written material from your doctor or pharmacy?: Never In the past 12 months, has lack of transportation kept you from medical appointments or from getting medications?: No In the past 12 months, has lack of transportation kept you from meetings, work, or from getting things needed for daily living?: No   Home Assistive Devices / [place] Devices/Equipment: Eyeglasses Home Equipment: Conservation officer, nature (2 wheels), Shower seat - built in   Prior Device Use: Indicate devices/aids used by the patient prior to current illness, exacerbation or injury? Walker   Current Functional Level Cognition   Overall Cognitive Status: Impaired/Different from baseline Current Attention Level: Sustained Orientation Level: Oriented X4 Following Commands: Follows one step commands with increased time Safety/Judgement: Decreased awareness of safety, Decreased awareness of  deficits General Comments: had pre-med prior to chest tube out    Extremity Assessment (includes Sensation/Coordination)   Upper Extremity Assessment: Overall WFL for tasks assessed RUE Deficits / Details: AROM WFL with sternal precautions; strength grossly 3+/5 RUE Coordination: decreased fine motor LUE Deficits / Details: AROM WFL with sternal precautions; strength grossly 3+/5 LUE Coordination: decreased fine motor  Lower Extremity Assessment: LLE deficits/detail, RLE deficits/detail RLE Deficits / Details: AAROM noted stiffness/rigidity throughout, able to lift antigravity, but limited knee flexion RLE Coordination: decreased gross motor LLE Deficits / Details: AAROM noted stiffness/rigidity throughout, able to lift antigravity, but limited knee flexion LLE Coordination: decreased gross motor     ADLs   Overall ADL's : Needs assistance/impaired Eating/Feeding: Independent, Sitting Eating/Feeding Details (indicate cue type and reason): in recliner Grooming: Supervision/safety, Set up, Sitting Grooming Details (indicate cue type and reason): in recliner Upper Body Bathing: Set up, Supervision/ safety, Sitting Upper Body Bathing Details (indicate cue type and reason): in recliner Lower Body Bathing: Minimal assistance, Sit to/from stand Lower Body Bathing Details (indicate cue type and reason): min A +2 sit<>stand Upper Body Dressing : Set up, Supervision/safety, Sitting Upper Body Dressing Details (indicate cue type and reason): in recliner Lower Body Dressing: Moderate assistance Lower Body Dressing Details (indicate cue type and reason): min A +2 sit<>stand Toilet Transfer: Minimal assistance, +2 for physical assistance, +2 for safety/equipment, Ambulation, Rolling walker (2 wheels) Toileting- Clothing Manipulation and Hygiene: Total assistance Toileting - Clothing Manipulation Details (indicate cue type and reason): min A +2 sit<>stand     Mobility   Overal bed mobility: Needs  Assistance Bed Mobility: Rolling, Sidelying to Sit Rolling: Max assist Sidelying to sit: Max assist General bed mobility comments: pt up in recliner upon room entry     Transfers   Overall transfer level: Needs assistance Equipment used: Rolling walker (2 wheels) Transfers: Sit to/from Stand Sit to Stand: Min assist, +2 physical assistance General transfer comment: holding onto heart pillow and momentum to get up  Ambulation / Gait / Stairs / Wheelchair Mobility   Ambulation/Gait Ambulation/Gait assistance: Mod assist, +2 safety/equipment Gait Distance (Feet): 12 Feet (&20') Assistive device: Ethelene Hal Gait Pattern/deviations: Step-to pattern, Step-through pattern, Wide base of support, Trunk flexed, Decreased dorsiflexion - right, Decreased dorsiflexion - left, Decreased stride length General Gait Details: assist to guide walker and for forward momentum with very slight foot clearance; sat in chair in room and then stood again with RN pushing recliner behind     Posture / Balance Dynamic Sitting Balance Sitting balance - Comments: falling back and to R with multiple cues and facilitation for anterior and L lateral weight shift Balance Overall balance assessment: Needs assistance Sitting-balance support: No upper extremity supported, Feet supported Sitting balance-Leahy Scale: Fair Sitting balance - Comments: falling back and to R with multiple cues and facilitation for anterior and L lateral weight shift Postural control: Right lateral lean, Posterior lean Standing balance support: Bilateral upper extremity supported Standing balance-Leahy Scale: Poor Standing balance comment: UE support and assist for balance in standing     Special needs/care consideration Skin Abrasion: back/left: Surgical incision: chest, leg    Previous Home Environment (from acute therapy documentation) Living Arrangements: Spouse/significant other  Lives With: Spouse Available Help at Discharge:  Family, Available 24 hours/day Type of Home: House Home Layout: One level (has a bonus room upstairs) Home Access: Stairs to enter (threshold) Entrance Stairs-Rails: None Entrance Stairs-Number of Steps: threshold Bathroom Shower/Tub: Multimedia programmer: Handicapped height Bathroom Accessibility: Yes How Accessible: Accessible via walker Home Care Services: No   Discharge Living Setting Plans for Discharge Living Setting: Patient's home Type of Home at Discharge: House Discharge Home Layout: One level (has a bonus room upstairs) Discharge Home Access: Stairs to enter (threshold) Entrance Stairs-Number of Steps: threshold Discharge Bathroom Shower/Tub: Walk-in shower Discharge Bathroom Toilet: Handicapped height Discharge Bathroom Accessibility: Yes How Accessible: Accessible via walker Does the patient have any problems obtaining your medications?: No   Social/Family/Support Systems Anticipated Caregiver: Maanas Haran, wife Anticipated Caregiver's Contact Information: [phone] Caregiver Availability: 24/7 Discharge Plan Discussed with Primary Caregiver: Yes Is Caregiver In Agreement with Plan?: Yes Does Caregiver/Family have Issues with Lodging/Transportation while Pt is in Rehab?: No   Goals Patient/Family Goal for Rehab: Mod I-Supervision: PT/OT Expected length of stay: 7-10 days Pt/Family Agrees to Admission and willing to participate: Yes Program Orientation Provided & Reviewed with Pt/Caregiver Including Roles  & Responsibilities: Yes   Decrease burden of Care through IP rehab admission: NA   Possible need for SNF placement upon discharge: Not anticipated   Patient Condition: I have reviewed medical records from [facility], spoken with CM, and patient and spouse. I met with patient at the bedside for inpatient rehabilitation assessment.  Patient will benefit from ongoing PT and OT, can actively participate in 3 hours of therapy a day 5 days of  the week, and can make measurable gains during the admission.  Patient will also benefit from the coordinated team approach during an Inpatient Acute Rehabilitation admission.  The patient will receive intensive therapy as well as Rehabilitation physician, nursing, social worker, and care management interventions.  Due to safety, skin/wound care, disease management, medication administration, pain management, and patient education the patient requires 24 hour a day rehabilitation nursing.  The patient is currently Supervision-Mod A with mobility and Supervision-Min A with basic ADLs.  Discharge setting and therapy post discharge at home with home health is anticipated.  Patient has agreed to participate in the Acute  Inpatient Rehabilitation Program and will admit today.   Preadmission Screen Completed By:  Bethel Born, 08/10/2022 12:15 PM ______________________________________________________________________   Discussed status with Dr. Letta Pate on 08/10/22  at 2:47 PM and received approval for admission today.   Admission Coordinator:  Bethel Born, CCC-SLP, time 2:47 PM/Date 08/10/22     Assessment/Plan: Diagnosis:  Debility after CABG Does the need for close, 24 hr/day Medical supervision in concert with the patient's rehab needs make it unreasonable for this patient to be served in a less intensive setting? Yes Co-Morbidities requiring supervision/potential complications: Hx PE on chronic anticoagulation, s/p prostatectomy, Post op encephalopathy Due to bladder management, bowel management, safety, skin/wound care, disease management, medication administration, pain management, and patient education, does the patient require 24 hr/day rehab nursing? Yes Does the patient require coordinated care of a physician, rehab nurse, PT, OT, and SLP to address physical and functional deficits in the context of the above medical diagnosis(es)? Yes Addressing deficits in the following  areas: balance, endurance, locomotion, strength, transferring, bowel/bladder control, bathing, dressing, feeding, grooming, toileting, cognition, and psychosocial support Can the patient actively participate in an intensive therapy program of at least 3 hrs of therapy 5 days a week? Yes The potential for patient to make measurable gains while on inpatient rehab is excellent Anticipated functional outcomes upon discharge from inpatient rehab: modified independent PT, modified independent OT, supervision SLP Estimated rehab length of stay to reach the above functional goals is: 7-10d Anticipated discharge destination: Home 10. Overall Rehab/Functional Prognosis: good     MD Signature: Charlett Blake M.D. [facility] Fellow Am Acad of Phys Med and Rehab Diplomate Am Board of Electrodiagnostic Med Fellow Am Board of Interventional Pain

## 2022-08-10 NOTE — Progress Notes (Incomplete)
Inpatient Rehabilitation Discharge Medication Review by a Pharmacist  A complete drug regimen review was completed for this patient to identify any potential clinically significant medication issues.  High Risk Drug Classes Is patient taking? Indication by Medication  Antipsychotic {Receiving?:26196} Prochlorperazine - PRN nausea/vomiting  Anticoagulant {Receiving?:26196} Rivaroxaban - hx PE  Antibiotic {Receiving?:26196}   Opioid {Receiving?:26196} Oxycodone - PRN pain  Antiplatelet {Receiving?:26196} Aspirin - CAD   Hypoglycemics/insulin {Yes or No?:26198}   Vasoactive Medication {Receiving?:26196} Metoprolol - HTN  Chemotherapy {Receiving Chemo?:26197}   Other {Yes or No?:26198} Sinemet - Parkinson Diphenhydramine - PRN itching Claritin - allergic rhinitis Vit B12 - supplement Donepezil - memory/dementia Melatonin, trazodone - sleep Methocarbamol - muscle spasms Pantoprazole - GERD ppx Rosuvastatin - HLD     Type of Medication Issue Identified Description of Issue Recommendation(s)  Drug Interaction(s) (clinically significant)     Duplicate Therapy     Allergy     No Medication Administration End Date     Incorrect Dose     Additional Drug Therapy Needed     Significant med changes from prior encounter (inform family/care partners about these prior to discharge). Omeprazole > pantoprazole while IP  Gabapentin not resumed in CIR Communicate medication changes with patient/family at discharge  Other       Clinically significant medication issues were identified that warrant physician communication and completion of prescribed/recommended actions by midnight of the next day:  No   Pharmacist comments: ***   Time spent performing this drug regimen review (minutes): 20   Thank you for allowing pharmacy to be a part of this patient's care.  Ardyth Harps, PharmD Clinical Pharmacist

## 2022-08-10 NOTE — TOC Transition Note (Signed)
Transition of Care (TOC) - CM/SW Discharge Note Marvetta Gibbons RN, BSN Transitions of Care Unit 4E- RN Case Manager See Treatment Team for direct phone #   Patient Details  Name: Terry [name] MRN: [id number] Date of Birth: [birth year]  Transition of Care [facility]) CM/SW Contact:  Dawayne Patricia, RN Phone Number: 08/10/2022, 3:54 PM   Clinical Narrative:    Notified by Bethel Born liaison that pt will have bed available for admission later today. Pt has been cleared for transition to [facility] rehab.   No further TOC needs noted. CM notified [facility] liaison of CIR admission and they will follow for [facility] needs post rehab stay.    Final next level of care: [place] Barriers to Discharge: Barriers Resolved   Patient Goals and CMS Choice    Return home after rehab  Discharge Placement               [facility] INPT rehab          Discharge Plan and Services Additional resources added to the After Visit Summary for     Discharge Planning Services: CM Consult Post Acute Care Choice: IP Rehab          DME Arranged: N/A DME Agency: NA       HH Arranged: NA HH Agency: NA        Social Determinants of Health (SDOH) Interventions SDOH Screenings   Food Insecurity: No Food Insecurity (08/08/2022)  Housing: Low Risk  (08/08/2022)  Transportation Needs: No Transportation Needs (08/08/2022)  Utilities: Not At Risk (08/08/2022)  Depression (PHQ2-9): Low Risk  (07/26/2018)  Tobacco Use: Low Risk  (08/05/2022)     Readmission Risk Interventions    08/10/2022    3:54 PM  Readmission Risk Prevention Plan  Post Dischage Appt Complete  Medication Screening Complete  Transportation Screening Complete

## 2022-08-11 ENCOUNTER — Encounter (HOSPITAL_COMMUNITY): Payer: Self-pay | Admitting: Physical Medicine & Rehabilitation

## 2022-08-11 ENCOUNTER — Other Ambulatory Visit: Payer: Self-pay

## 2022-08-11 DIAGNOSIS — R5381 Other malaise: Secondary | ICD-10-CM | POA: Diagnosis not present

## 2022-08-11 LAB — COMPREHENSIVE METABOLIC PANEL
ALT: 28 U/L (ref 0–44)
AST: 25 U/L (ref 15–41)
Albumin: 3.3 g/dL — ABNORMAL LOW (ref 3.5–5.0)
Alkaline Phosphatase: 51 U/L (ref 38–126)
Anion gap: 13 (ref 5–15)
BUN: 13 mg/dL (ref 8–23)
CO2: 24 mmol/L (ref 22–32)
Calcium: 9.2 mg/dL (ref 8.9–10.3)
Chloride: 102 mmol/L (ref 98–111)
Creatinine, Ser: 0.94 mg/dL (ref 0.61–1.24)
GFR, Estimated: >60 mL/min (ref >60)
Glucose, Bld: 105 mg/dL — ABNORMAL HIGH (ref 70–99)
Potassium: 3.9 mmol/L (ref 3.5–5.1)
Sodium: 139 mmol/L (ref 135–145)
Total Bilirubin: 0.8 mg/dL (ref 0.3–1.2)
Total Protein: 6.3 g/dL — ABNORMAL LOW (ref 6.5–8.1)

## 2022-08-11 LAB — CBC WITH DIFFERENTIAL/PLATELET
Abs Immature Granulocytes: 0 10*3/uL (ref 0.00–0.07)
Basophils Absolute: 0.1 10*3/uL (ref 0.0–0.1)
Basophils Relative: 1 %
Eosinophils Absolute: 0.1 10*3/uL (ref 0.0–0.5)
Eosinophils Relative: 1 %
HCT: 32.7 % — ABNORMAL LOW (ref 39.0–52.0)
Hemoglobin: 11.1 g/dL — ABNORMAL LOW (ref 13.0–17.0)
Lymphocytes Relative: 20 %
Lymphs Abs: 1.9 10*3/uL (ref 0.7–4.0)
MCH: 29.4 pg (ref 26.0–34.0)
MCHC: 33.9 g/dL (ref 30.0–36.0)
MCV: 86.5 fL (ref 80.0–100.0)
Monocytes Absolute: 0.4 10*3/uL (ref 0.1–1.0)
Monocytes Relative: 4 %
Neutro Abs: 7 10*3/uL (ref 1.7–7.7)
Neutrophils Relative %: 74 %
Platelets: 327 10*3/uL (ref 150–400)
RBC: 3.78 MIL/uL — ABNORMAL LOW (ref 4.22–5.81)
RDW: 15.5 % (ref 11.5–15.5)
WBC: 9.4 10*3/uL (ref 4.0–10.5)
nRBC: 0 % (ref 0.0–0.2)
nRBC: 0 /100 WBC

## 2022-08-11 NOTE — Progress Notes (Signed)
[place] Individual Statement of Services  Patient Name:  EDMAN KAVA  Date:  08/11/2022  Welcome to the [place].  Our goal is to provide you with an individualized program based on your diagnosis and situation, designed to meet your specific needs.  With this comprehensive rehabilitation program, you will be expected to participate in at least 3 hours of rehabilitation therapies Monday-Friday, with modified therapy programming on the weekends.  Your rehabilitation program will include the following services:  Physical Therapy (PT), Occupational Therapy (OT), Speech Therapy (ST), 24 hour per day rehabilitation nursing, Therapeutic Recreaction (TR), Neuropsychology, Care Coordinator, Rehabilitation Medicine, Nutrition Services, Pharmacy Services, and Other  Weekly team conferences will be held on Wednesdays to discuss your progress.  Your Inpatient Rehabilitation Care Coordinator will talk with you frequently to get your input and to update you on team discussions.  Team conferences with you and your family in attendance may also be held.  Expected length of stay: 7-10 Days  Overall anticipated outcome: Mod I-Supervision   Depending on your progress and recovery, your program may change. Your Inpatient Rehabilitation Care Coordinator will coordinate services and will keep you informed of any changes. Your Inpatient Rehabilitation Care Coordinator's name and contact numbers are listed  below.  The following services may also be recommended but are not provided by the [facility]:   [place] will be made to provide these services after discharge if needed.  Arrangements include referral to agencies that provide these services.  Your insurance has been verified to be:   Medicare A & B Your primary doctor is:   Shon Baton, MD  Pertinent information  will be shared with your doctor and your insurance company.  [place], [place] or ([phone]:  Erlene Quan, [place] or ([phone]  Information discussed with and copy given to patient by: Dyanne Iha, 08/11/2022, 10:11 AM

## 2022-08-11 NOTE — Progress Notes (Signed)
PROGRESS NOTE   Subjective/Complaints:  Pt slept ok last noc no chest or leg pain last noc  ROS- neg CP, SOB, N/V/D  Objective:   No results found. Recent Labs    08/11/22 0629  WBC 9.4  HGB 11.1*  HCT 32.7*  PLT 327   Recent Labs    08/09/22 0127 08/11/22 0629  NA 139 139  K 3.7 3.9  CL 101 102  CO2 30 24  GLUCOSE 119* 105*  BUN 15 13  CREATININE 0.94 0.94  CALCIUM 9.2 9.2    Intake/Output Summary (Last 24 hours) at 08/11/2022 0744 Last data filed at 08/11/2022 0718 Gross per 24 hour  Intake 237 ml  Output --  Net 237 ml        Physical Exam: Vital Signs Blood pressure (!) 95/57, pulse 84, temperature 98.3 F (36.8 C), resp. rate 17, height '5\' 8"'$  (1.727 m), weight 74 kg, SpO2 97 %.   General: No acute distress Mood and affect are appropriate Heart: Regular rate and rhythm no rubs murmurs or extra sounds Lungs: Clear to auscultation, breathing unlabored, no rales or wheezes Abdomen: Positive bowel sounds, soft nontender to palpation, nondistended Extremities: No clubbing, cyanosis, or edema Skin: No evidence of breakdown, no evidence of rash Neurologic: Cranial nerves II through XII intact, motor strength is 5/5 in bilateral deltoid, bicep, tricep, grip, hip flexor, knee extensors, ankle dorsiflexor and plantar flexor Oriented to person place time, pt thought he saw me "a couple days ago" - was yesterday  Musculoskeletal: Full range of motion in all 4 extremities. No joint swelling    Assessment/Plan: 1. Functional deficits which require 3+ hours per day of interdisciplinary therapy in a comprehensive inpatient rehab setting. Physiatrist is providing close team supervision and 24 hour management of active medical problems listed below. Physiatrist and rehab team continue to assess barriers to discharge/monitor patient progress toward functional and medical goals  Care Tool:  Bathing               Bathing assist       Upper Body Dressing/Undressing Upper body dressing        Upper body assist      Lower Body Dressing/Undressing Lower body dressing            Lower body assist       Toileting Toileting    Toileting assist       Transfers Chair/bed transfer  Transfers assist           Locomotion Ambulation   Ambulation assist              Walk 10 feet activity   Assist           Walk 50 feet activity   Assist           Walk 150 feet activity   Assist           Walk 10 feet on uneven surface  activity   Assist           Wheelchair     Assist               Wheelchair 50 feet  with 2 turns activity    Assist            Wheelchair 150 feet activity     Assist          Blood pressure (!) 95/57, pulse 84, temperature 98.3 F (36.8 C), resp. rate 17, height '5\' 8"'$  (1.727 m), weight 74 kg, SpO2 97 %.  Medical Problem List and Plan: 1. Functional deficits secondary to debility             -patient may  shower             -ELOS/Goals: 7-10d ModI/Sup goals   2.  Antithrombotics: -DVT/anticoagulation:  Pharmaceutical: Xarelto             -antiplatelet therapy: aspirin 81 mg daily   3. Pain Management: Tylenol as needed   4. Mood/Behavior/Sleep: LCSW to evaluate and provide emotional support             -antipsychotic agents: n/a   5. Neuropsych/cognition: This patient is capable of making decisions on his own behalf with assistance for complex decisions.   6. Skin/Wound Care: Routine skin care checks   7. Fluids/Electrolytes/Nutrition: Routine Is and Os and follow-up chemistries   8: Hypertension: monitor TID and prn; on Lopressor 12.5 mg BID Vitals:   08/10/22 2046 08/11/22 0523  BP: 113/68 (!) 95/57  Pulse: 84 84  Resp: 18 17  Temp: 98.2 F (36.8 C) 98.3 F (36.8 C)  SpO2: 98% 97%      9: Hyperlipidemia: continue statin   10: CAD s/p CABG on 08/04/22              -sternal precautions x 6 wks             -continue Lopressor 12.5 mg BID   11: History of PE on Xarelto   12: Multiple prior spine surgeries. Last in October 2023 Dr. Ellene Route (lumbar)   13: History of Parkinson's disease: Continue Sinemet and Aricept    LOS: 1 days A FACE TO FACE EVALUATION WAS PERFORMED  Charlett Blake 08/11/2022, 7:44 AM

## 2022-08-11 NOTE — Progress Notes (Signed)
Physical Therapy Session Note  Patient Details  Name: Terry [name] MRN: [id number] Date of Birth: Oct 08, 1950  Today's Date: 08/11/2022 PT Individual Time: 1021-1049 PT Individual Time Calculation (min): 28 min   Short Term Goals: Week 1:  PT Short Term Goal 1 (Week 1): = to LTGs based on ELOS  Skilled Therapeutic Interventions/Progress Updates:    Pt received sitting in w/c and is agreeable to therapy session.  Transported to gym in w/c for time management and energy conservation.  Sit<>stands, no AD, with CGA/light min assist and cuing to maintain sternal precautions during this as pt tries to push up from armrests.  Gait training 13f, no AD, with CGA/light min assist for steadying/balance. Pt demonstrating the following gait deviations with therapist providing the described cuing and facilitation for improvement:  - reciprocal stepping pattern - slightly wider BOS with ankles slightly inverting - holds head stiff looking forward and down - decreased UB trunk rotation   Participated in Timed Up and Go (TUG) without AD: 1st trial: 21.45 seconds 2nd trial: 16.52 seconds  Patient demonstrates high fall risk as indicated by requiring >13.5seconds to complete the TUG.   Gait training additional ~1214fx2, no AD, with CGA/light min assist for steadying/balance. Pt continuing to demo the above gait deviations. Performed head rotations to visually scan environment and look around to identify items in hallway to work on functional ambulation and encourage head movements. No overt LOB with this.  Sit>supine with min assist and pt having poor motor planning/sequencing how to lie down and knowing where his head is supposed to go at this time. Pt left supine in bed with needs in reach and bed alarm on.  Therapy Documentation Precautions:  Precautions Precautions: Sternal, Fall Precaution Comments: verbal review for Move in the Tube precautions Restrictions Weight Bearing Restrictions:  No   Pain: Reports feeling some "pressure" in his chest from the surgical site - provided distraction, utilized heart pillow for pain management, and emotional support.  Balance: Standardized Balance Assessment Standardized Balance Assessment: Timed Up and Go Test Timed Up and Go Test TUG: Normal TUG Normal TUG (seconds): 16 (no AD)    Therapy/Group: Individual Therapy  CaTawana Scale PT, DPT, NCS, CSRS 08/11/2022, 10:31 AM

## 2022-08-11 NOTE — Progress Notes (Signed)
Inpatient Rehabilitation Care Coordinator Assessment and Plan Patient Details  Name: Terry [name] MRN: [id number] Date of Birth: 06/14/1950  Today's Date: 08/11/2022  Hospital Problems: Principal Problem:   Debility  Past Medical History:  Past Medical History:  Diagnosis Date   Arthritis    neck, back, knees, right ankle   Bladder calculus    Chronic back pain    Coronary artery disease    GERD (gastroesophageal reflux disease)    History of colon polyps    benign   History of kidney stones    and bladder stone   History of prostate cancer urologist-- dr Alinda Money   dx 2014---  s/p  prostatectomy 05-22-2013 ,  Gleason 3+3   Hypercholesterolemia    takes Crestor daily   Hyperlipidemia    Pneumonia 10/2019   Pulmonary emboli ([place]) 10/2019   Wears glasses    Past Surgical History:  Past Surgical History:  Procedure Laterality Date   ACHILLES TENDON SURGERY Right 06-07-2018   '@[facility]'$    reconstruction   ANTERIOR CERVICAL DECOMP/DISCECTOMY FUSION N/A 06/27/2014   Procedure: ANTERIOR CERVICAL DECOMPRESSION/DISCECTOMY FUSION C5-C7   (2 LEVELS);  Surgeon: Melina Schools, MD;  Location: [place];  Service: Orthopedics;  Laterality: N/A;   back fusion  03/2022   CARDIAC CATHETERIZATION  2004   CLIPPING OF ATRIAL APPENDAGE Left 08/04/2022   Procedure: CLIPPING OF ATRIAL APPENDAGE USING AN ATRICURE 6 ATRICLIP;  Surgeon: Neomia Glass, MD;  Location: [place];  Service: Open Heart Surgery;  Laterality: Left;   COLONOSCOPY     CORONARY ARTERY BYPASS GRAFT N/A 08/04/2022   Procedure: CORONARY ARTERY BYPASS GRAFTING (CABG) X THREE USING LEFT INTERNAL MAMMARY ARTERY (LIMA) AND ENDOSCOPICALLY HARVESTED RIGHT GREATER SAPHENOUS VEIN.;  Surgeon: Neomia Glass, MD;  Location: [place];  Service: Open Heart Surgery;  Laterality: N/A;   CYSTOSCOPY WITH LITHOLAPAXY N/A 03/27/2019   Procedure: CYSTOSCOPY WITH REMOVAL OF BLADDER STONE AND FOREIGN BODY;  Surgeon: Raynelle Bring, MD;  Location: [facility];  Service: Urology;  Laterality: N/A;   EYE SURGERY     lazy eye as child, lasik surgery   FINGER SURGERY Right    4th finger   I & D EXTREMITY Right 07/10/2018   Procedure: IRRIGATION AND DEBRIDEMENT RIGHT ANKLE WOUND AND WOUND VAC PLACEMENT;  Surgeon: Wylene Simmer, MD;  Location: [place];  Service: Orthopedics;  Laterality: Right;   KNEE ARTHROSCOPY Bilateral right ?;  left 05-25-2007  '@[facility]'$    LEFT HEART CATH AND CORONARY ANGIOGRAPHY N/A 07/20/2022   Procedure: LEFT HEART CATH AND CORONARY ANGIOGRAPHY;  Surgeon: Lorretta Harp, MD;  Location: [place] [facility];  Service: Cardiovascular;  Laterality: N/A;   POSTERIOR LAMINECTOMY / DECOMPRESSION LUMBAR SPINE  08-31-2016    dr elsner  '@[facility]'$    PROSTATE BIOPSY  04/13/2013   gleason 3+3=6, vol 55.4 cc   ROBOT ASSISTED LAPAROSCOPIC RADICAL PROSTATECTOMY N/A 05/22/2013   Procedure: ROBOTIC ASSISTED LAPAROSCOPIC RADICAL PROSTATECTOMY LEVEL 1;  Surgeon: Dutch Gray, MD;  Location: [facility];  Service: Urology;  Laterality: N/A;   TEE WITHOUT CARDIOVERSION N/A 08/04/2022   Procedure: TRANSESOPHAGEAL ECHOCARDIOGRAM;  Surgeon: Neomia Glass, MD;  Location: [place];  Service: Open Heart Surgery;  Laterality: N/A;   TONSILLECTOMY     Social History:  reports that he has never smoked. He has never used smokeless tobacco. He reports current alcohol use. He reports that he does not use drugs.  Family / Support Systems Marital Status: Married Patient Roles:  Spouse Spouse/Significant Other: Christell Faith Children: 2 Daughter 49 47 Other Supports: Brother and Sister. Sister traveling to assist at d/c. Anticipated Caregiver: [name] (works PT- 1 day a week). Sister also traveling to assist Ability/Limitations of Caregiver: none Caregiver Availability: 24/7 Family Dynamics: supportive family. Spouse, siblings and neighbors. Reccomended to CIR by neighbor who is a retired Therapist, sports.  Social History Preferred language: English Religion: [employer] Cultural  Background: Pleasant and not working before inccident. Education: HS Health Literacy - How often do you need to have someone help you when you read instructions, pamphlets, or other written material from your doctor or pharmacy?: Never Writes: Yes Employment Status: Disabled Date Retired/Disabled/Unemployed: N/A Public relations account executive Issues: N/A Guardian/Conservator: N/A   Abuse/Neglect Abuse/Neglect Assessment Can Be Completed: Yes Physical Abuse: Denies Verbal Abuse: Denies Sexual Abuse: Denies Exploitation of patient/patient's resources: Denies Self-Neglect: Denies  Patient response to: Social Isolation - How often do you feel lonely or isolated from those around you?: Never  Emotional Status Pt's affect, behavior and adjustment status: Patient very pleasant. Ready to get back to his independent level and have identified that he would benefit from CIR. Recent Psychosocial Issues: coping Psychiatric History: N/A Substance Abuse History: N/A  Patient / Family Perceptions, Expectations & Goals Pt/Family understanding of illness & functional limitations: yes and spouse at bedside. Premorbid pt/family roles/activities: Independent and driving overall. Anticipated changes in roles/activities/participation: Patient anticipates SUP/MOD I goals. Patient's older will be traveling here to assist spouse with patient a DC. Pt/family expectations/goals: SUP/MOD I  Recruitment consultant: None Premorbid Home Care/DME Agencies: None Transportation available at discharge: Spouse, neighbors or sister able to transport Is the patient able to respond to transportation needs?: Yes In the past 12 months, has lack of transportation kept you from medical appointments or from getting medications?: No In the past 12 months, has lack of transportation kept you from meetings, work, or from getting things needed for daily living?: No Resource referrals recommended:  Neuropsychology  Discharge Planning Living Arrangements: Spouse/significant other Support Systems: Spouse/significant other Type of Residence: Private residence (1 level home) Insurance Resources: Multimedia programmer (specify) Financial Resources: Radio broadcast assistant Screen Referred: No Living Expenses: Own Money Management: Patient, Spouse Does the patient have any problems obtaining your medications?: No Home Management: Independent Patient/Family Preliminary Plans: Plans to manage, if unable spouse able to assist. Care Coordinator Anticipated Follow Up Needs: HH/OP Expected length of stay: 7-10 Days  Clinical Impression SW met with patient and spouse at bedside, very pleasant. Sw introduced self explained role and addressed questions and concerns. Patient anticipates discharging home with spouse and older sister traveling here to assist. Spouse works PT and requesting d/c any day but on a Wednesday. Spouse requesting phone call with updates on 3/13. Patient used no DME prior. Sw will continue to FU with questions or concerns.    Dyanne Iha 08/11/2022, 12:44 PM

## 2022-08-11 NOTE — Evaluation (Addendum)
Occupational Therapy Assessment and Plan  Patient Details  Name: Terry [name] MRN: [id number] Date of Birth: 10/12/1950  OT Diagnosis: abnormal posture, acute pain, and muscle weakness (generalized) Rehab Potential: Rehab Potential (ACUTE ONLY): Good ELOS: 7-10 days   Today's Date: 08/11/2022 OT Individual Time: [id number] OT Individual Time Calculation (min): 59 min    OT Individual Time: [id number] OT Individual Time Calculation (min):  49 min     Hospital Problem: Principal Problem:   Debility   Past Medical History:  Past Medical History:  Diagnosis Date   Arthritis    neck, back, knees, right ankle   Bladder calculus    Chronic back pain    Coronary artery disease    GERD (gastroesophageal reflux disease)    History of colon polyps    benign   History of kidney stones    and bladder stone   History of prostate cancer urologist-- dr Alinda Money   dx 2014---  s/p  prostatectomy 05-22-2013 ,  Gleason 3+3   Hypercholesterolemia    takes Crestor daily   Hyperlipidemia    Pneumonia 10/2019   Pulmonary emboli ([place]) 10/2019   Wears glasses    Past Surgical History:  Past Surgical History:  Procedure Laterality Date   ACHILLES TENDON SURGERY Right 06-07-2018   '@[facility]'$    reconstruction   ANTERIOR CERVICAL DECOMP/DISCECTOMY FUSION N/A 06/27/2014   Procedure: ANTERIOR CERVICAL DECOMPRESSION/DISCECTOMY FUSION C5-C7   (2 LEVELS);  Surgeon: Melina Schools, MD;  Location: [place];  Service: Orthopedics;  Laterality: N/A;   back fusion  03/2022   CARDIAC CATHETERIZATION  2004   CLIPPING OF ATRIAL APPENDAGE Left 08/04/2022   Procedure: CLIPPING OF ATRIAL APPENDAGE USING AN ATRICURE 45 ATRICLIP;  Surgeon: Neomia Glass, MD;  Location: [place];  Service: Open Heart Surgery;  Laterality: Left;   COLONOSCOPY     CORONARY ARTERY BYPASS GRAFT N/A 08/04/2022   Procedure: CORONARY ARTERY BYPASS GRAFTING (CABG) X THREE USING LEFT INTERNAL MAMMARY ARTERY (LIMA) AND ENDOSCOPICALLY HARVESTED RIGHT  GREATER SAPHENOUS VEIN.;  Surgeon: Neomia Glass, MD;  Location: [place];  Service: Open Heart Surgery;  Laterality: N/A;   CYSTOSCOPY WITH LITHOLAPAXY N/A 03/27/2019   Procedure: CYSTOSCOPY WITH REMOVAL OF BLADDER STONE AND FOREIGN BODY;  Surgeon: Raynelle Bring, MD;  Location: [facility];  Service: Urology;  Laterality: N/A;   EYE SURGERY     lazy eye as child, lasik surgery   FINGER SURGERY Right    4th finger   I & D EXTREMITY Right 07/10/2018   Procedure: IRRIGATION AND DEBRIDEMENT RIGHT ANKLE WOUND AND WOUND VAC PLACEMENT;  Surgeon: Wylene Simmer, MD;  Location: [place];  Service: Orthopedics;  Laterality: Right;   KNEE ARTHROSCOPY Bilateral right ?;  left 05-25-2007  '@[facility]'$    LEFT HEART CATH AND CORONARY ANGIOGRAPHY N/A 07/20/2022   Procedure: LEFT HEART CATH AND CORONARY ANGIOGRAPHY;  Surgeon: Lorretta Harp, MD;  Location: [facility];  Service: Cardiovascular;  Laterality: N/A;   POSTERIOR LAMINECTOMY / DECOMPRESSION LUMBAR SPINE  08-31-2016    dr elsner  '@[facility]'$    PROSTATE BIOPSY  04/13/2013   gleason 3+3=6, vol 55.4 cc   ROBOT ASSISTED LAPAROSCOPIC RADICAL PROSTATECTOMY N/A 05/22/2013   Procedure: ROBOTIC ASSISTED LAPAROSCOPIC RADICAL PROSTATECTOMY LEVEL 1;  Surgeon: Dutch Gray, MD;  Location: [facility];  Service: Urology;  Laterality: N/A;   TEE WITHOUT CARDIOVERSION N/A 08/04/2022   Procedure: TRANSESOPHAGEAL ECHOCARDIOGRAM;  Surgeon: Neomia Glass, MD;  Location: [place];  Service:  Open Heart Surgery;  Laterality: N/A;   TONSILLECTOMY      Assessment & Plan Clinical Impression: Terry [name] is a 72 year old male who was evaluated by Dr. Gwenlyn Found on 07/17/2022 and had obtained a coronary CTA on 07/15/2022 revealing a coronary calcium score of 440 with significant disease in his RCA by FFR analysis.  Based on this, it was decided to proceed with outpatient radial diagnostic coronary angiography.  He was asked to hold his Xarelto 2 days before the procedure.  He was  evaluated by Dr. Tenny Craw and scheduled for coronary artery bypass grafting.  On 08/04/2022, he underwent CABG x 3 vessels, clipping of atrial appendage and vein harvest from right lower extremity.  Transferred to cardiac ICU with labile blood pressure.  Extubated on 3/06.  Mild delirium 3/08.  Foley discontinued.  Multiple loose stools overnight and bowel regimen decreased.  Started on aspirin 81 mg daily.  Lovenox for DVT prophylaxis.  Tolerating diet.  Xarelto resumed 3/10. Kidney function and hemoglobin remained stable.  Requiring no oxygen supplementation.  Ambulated yesterday 450 feet with front wheel rolling walker.The patient requires inpatient medicine and rehabilitation evaluations and services for ongoing dysfunction secondary to coronary artery disease status post CABG.  Patient transferred to CIR on 08/10/2022 .    Patient currently requires min with basic self-care skills and IADL secondary to muscle weakness, decreased cardiorespiratoy endurance, decreased motor planning, decreased attention, decreased awareness, decreased problem solving, decreased safety awareness, and decreased memory, and decreased sitting balance, decreased standing balance, decreased balance strategies, and difficulty maintaining precautions.  Prior to hospitalization, patient could complete BADLs and IADLs with independent .  Patient will benefit from skilled intervention to decrease level of assist with basic self-care skills, increase independence with basic self-care skills, and increase level of independence with iADL prior to discharge home with care partner.  Anticipate patient will require 24 hour supervision and follow up outpatient.  OT - End of Session Activity Tolerance: Decreased this session Endurance Deficit: Yes Endurance Deficit Description: requires seated rest breaks due to impaired endurance OT Assessment Rehab Potential (ACUTE ONLY): Good OT Patient demonstrates impairments in the following area(s):  Balance;Safety;Cognition;Sensory;Endurance;Motor;Pain OT Basic ADL's Functional Problem(s): Grooming;Bathing;Dressing;Toileting OT Advanced ADL's Functional Problem(s): Simple Meal Preparation;Light Housekeeping OT Transfers Functional Problem(s): Tub/Shower;Toilet OT Plan OT Intensity: Minimum of 1-2 x/day, 45 to 90 minutes OT Frequency: 5 out of 7 days OT Duration/Estimated Length of Stay: 7-10 days OT Treatment/Interventions: Balance/vestibular training;Discharge planning;Pain management;Self Care/advanced ADL retraining;Therapeutic Activities;UE/LE Coordination activities;Cognitive remediation/compensation;Disease mangement/prevention;Functional mobility training;Patient/family education;Skin care/wound managment;Therapeutic Exercise;Community reintegration;DME/adaptive equipment instruction;Neuromuscular re-education;Psychosocial support;UE/LE Strength taining/ROM OT Self Feeding Anticipated Outcome(s): Independent OT Basic Self-Care Anticipated Outcome(s): Supervision OT Toileting Anticipated Outcome(s): Supervision OT Bathroom Transfers Anticipated Outcome(s): Supervision OT Recommendation Recommendations for Other Services: Speech consult (May benefit from SLP evalution d/t cognitive deficits) Patient destination: Home Follow Up Recommendations: Outpatient OT Equipment Recommended: To be determined   OT Evaluation Skilled Therapeutic Interventions/Progress Updates:  AM Session:  1:1 OT evaluation and intervention initiated with education provided on OT role, goals, and POC. Pt received supine in bed lightly sleeping easy to wake and receptive to skilled OT session. BADL retraining at shower level this session with skilled interventions including education on modified techniques, sternal precaution education, functional transfer/functional mobility training, energy conservation education, and functional cognition training. Pt ambulated using RW this session CGA with small step length  and shuffling gait pattern noted. Pt able to recall sternal precautions, however requiring min-mod cues to maintain precautions during BADLs and  functional transfers. Pt completed BADL tasks at levels listed below completing shower seated on tub bench, dressing tasks EOB, and grooming/hygiene tasks standing at sink. Pt wife present at end of session with additional education provided from OT. Pt was left resting in wc with call bell in reach, seat belt alarm on, and all needs met with wife present in room.  PM Session:  Pt received sitting up in wc presenting to being good spirits and receptive to skilled OT session. Pt reporting tightness in chest at incision site, however no other pain reported. Pt ambulated <> therapy gym using RW this session CGA for functional mobility and endurance training without SOB or fatigue reported. Pt mildly anxious upon arriving to therapy gym reporting he had been having some coughing and flem build-up with OT providing therapeutic support and education on using heart pillow for coughing and spitting up any flem build up. Noted improvement in pt moral following. Discussed medication management regiment with Pt with pt reporting he previously completed all medication management independently utilizing a pill box. Pt completed The Pill Box Test to assess pt readiness in medication management. Pt required increased time (10 minutes) for tasks, however Pt able to correctly sort 4/5 medications with 100% accuracy. Misplaced movement errors noted in green pills (one tablet twice a day with breakfast and dinner) with Pt requiring max questioning cues to correct mistake. Discussed how taking correct dosage of medications at wrong time of day could lead to medication not working appropriately or adverse side effects with Pt verbalizing understanding. Following task, OT advised pt to receive assistance from wife for medication management to ensure all medications are taken at correct time of  day in correct dosage with Pt verbalizing understanding.  Omission errors= 0 Misplaced movement errors: 7 Commission errors= 0 Total Errors= 7 (fail) Pt requesting to use restroom at end of session completing 3/3 tasks at [facility] level using RW with min cueing required for safety and RW usage. Pt was left resting in wc with call bell in reach, seat belt alarm on, and all needs met.   Precautions/Restrictions  Precautions Precautions: Sternal;Fall Precaution Comments: verbal review for Move in the Tube precautions Restrictions Weight Bearing Restrictions: No RUE Weight Bearing:  (sternal precautions) LUE Weight Bearing:  (sternal precautions) General Chart Reviewed: Yes Additional Pertinent History: Park[place], arthritis Family/Caregiver Present: Yes (wife present at end of session)   Home Living/Prior [place] expects to be discharged to:: Private residence Living Arrangements: Spouse/significant other Available Help at Discharge: Family, Available 24 hours/day (wife working) Type of Home: House Home Access: Stairs to enter Technical brewer of Steps: 1 step-up Entrance Stairs-Rails: None Home Layout: Other (Comment), Two level, Able to live on main level with bedroom/bathroom Bathroom Shower/Tub: Multimedia programmer: Handicapped height Bathroom Accessibility: Yes Additional Comments: 1 dog at home  Lives With: Spouse (wife, [name]) IADL History Homemaking Responsibilities: Yes Meal Prep Responsibility: Secondary Laundry Responsibility: Secondary Cleaning Responsibility: Secondary Bill Paying/Finance Responsibility: Secondary Shopping Responsibility: Secondary Child Care Responsibility: No Current License: Yes Mode of Transportation: Car Education: college Occupation: Retired 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 Prior Function Level of Independence: Independent with gait, Independent with homemaking with ambulation,  Independent with transfers  Able to [place]?: Yes Driving: Yes Vision Baseline Vision/History: 1 Wears glasses (pt awaiting retna surgery) Ability to See in Adequate Light: 1 Impaired (must wear glasses 24/7) Patient Visual Report: No change from baseline Vision Assessment?: No apparent visual deficits Perception  Perception:  Within Functional Limits Praxis Praxis: Impaired Praxis Impairment Details: Motor planning Cognition Cognition Overall Cognitive Status: Impaired/Different from baseline (Pt reported he feels a litle off his baseline) Arousal/Alertness: Awake/alert Orientation Level: Person;Place;Situation Person: Oriented Place: Oriented Situation: Oriented Memory: Impaired Memory Impairment: Retrieval deficit;Decreased recall of new information;Decreased short term memory Decreased Short Term Memory: Functional basic;Verbal complex Attention: Focused;Sustained;Selective Focused Attention: Appears intact Sustained Attention: Appears intact Selective Attention: Impaired Selective Attention Impairment: Functional basic;Verbal basic Awareness: Impaired Awareness Impairment: Anticipatory impairment Problem Solving: Impaired Problem Solving Impairment: Functional basic Executive Function: Self Monitoring Self Monitoring: Impaired Self Monitoring Impairment: Functional basic;Verbal complex Safety/Judgment: Impaired Brief Interview for Mental Status (BIMS) Repetition of Three Words (First Attempt): 3 Temporal Orientation: Year: Correct Temporal Orientation: Month: Accurate within 5 days Temporal Orientation: Day: Correct Recall: "Sock": No, could not recall Recall: "Blue": Yes, no cue required Recall: "Bed": No, could not recall BIMS Summary Score: 11 Sensation Sensation Light Touch: Impaired Detail Central sensation comments: reports R LE feels "grainy" like "sandpaper" compared to LLE, but states that was present prior to CABG but now is more intense - reports  new onset of  L UE feeling numb compared to R UE present since CABG Light Touch Impaired Details: Impaired LUE;Impaired RLE Hot/Cold: Not tested Proprioception: Appears Intact Stereognosis: Not tested Coordination Gross Motor Movements are Fluid and Coordinated: Yes Fine Motor Movements are Fluid and Coordinated: Yes Finger Nose Finger Test: motor movements are slow, however coordinated without dysmetria noted Motor  Motor Motor: Other (comment);Abnormal postural alignment and control Motor - Skilled Clinical Observations: generalized deconditioning s/p heart surgery  Trunk/Postural Assessment  Cervical Assessment Cervical Assessment: Exceptions to [facility] (slight forward head) Thoracic Assessment Thoracic Assessment: Exceptions to [facility] (mild rounded shoudlers with Pt holding self in guarded posiiton d/t sternal pain) Lumbar Assessment Lumbar Assessment: Exceptions to [facility] (posterior pelvic tilt) Postural Control Postural Control: Within Functional Limits  Balance Balance Balance Assessed: Yes Standardized Balance Assessment Standardized Balance Assessment: Timed Up and Go Test Timed Up and Go Test TUG: Normal TUG Normal TUG (seconds): 16 (no AD) Static Sitting Balance Static Sitting - Balance Support: Feet supported Static Sitting - Level of Assistance: 5: Stand by assistance Dynamic Sitting Balance Dynamic Sitting - Balance Support: Feet supported Dynamic Sitting - Level of Assistance: 5: Stand by assistance (CGa) Static Standing Balance Static Standing - Balance Support: During functional activity Static Standing - Level of Assistance: Other (comment);5: Stand by assistance (CGA) Dynamic Standing Balance Dynamic Standing - Balance Support: During functional activity Dynamic Standing - Level of Assistance: 4: Min assist;Other (comment) (CGA) Extremity/Trunk Assessment RUE Assessment RUE Assessment: Within Functional Limits Passive Range of Motion (PROM) Comments: WFL for  range assessed d/t sternal precuations Active Range of Motion (AROM) Comments: WFL for range assed d/t sternal precautions General Strength Comments: 4/5 LUE Assessment LUE Assessment: Within Functional Limits Passive Range of Motion (PROM) Comments: WFL for range assesed d/t sternal precuations Active Range of Motion (AROM) Comments: WFL for range assessed d/t steranl precautions General Strength Comments: 4/5  Care Tool Care Tool Self Care Eating   Eating Assist Level: Independent    Oral Care    Oral Care Assist Level: Contact Guard/Toucning assist    Bathing   Body parts bathed by patient: Right arm;Left arm;Chest;Abdomen;Front perineal area;Buttocks;Right upper leg;Left upper leg;Right lower leg;Left lower leg;Face Body parts bathed by helper: Buttocks   Assist Level: Minimal Assistance - Patient > 75%    Upper Body Dressing(including orthotics)   What is the patient  wearing?: Button up shirt (zipper jacket)   Assist Level: Minimal Assistance - Patient > 75%    Lower Body Dressing (excluding footwear)   What is the patient wearing?: Underwear/pull up;Pants Assist for lower body dressing: Contact Guard/Touching assist    Putting on/Taking off footwear   What is the patient wearing?: Socks;Shoes Assist for footwear: Contact Guard/Touching assist       Care Tool Toileting Toileting activity   Assist for toileting: Contact Guard/Touching assist     Care Tool Bed Mobility Roll left and right activity   Roll left and right assist level: Supervision/Verbal cueing    Sit to lying activity   Sit to lying assist level: Minimal Assistance - Patient > 75%    Lying to sitting on side of bed activity   Lying to sitting on side of bed assist level: the ability to move from lying on the back to sitting on the side of the bed with no back support.: Minimal Assistance - Patient > 75%     Care Tool Transfers Sit to stand transfer   Sit to stand assist level: Minimal  Assistance - Patient > 75%    Chair/bed transfer   Chair/bed transfer assist level: Contact Guard/Touching assist     Toilet transfer   Assist Level: Contact Guard/Touching assist     Care Tool Cognition  Expression of Ideas and Wants Expression of Ideas and Wants: 3. Some difficulty - exhibits some difficulty with expressing needs and ideas (e.g, some words or finishing thoughts) or speech is not clear  Understanding Verbal and Non-Verbal Content Understanding Verbal and Non-Verbal Content: 3. Usually understands - understands most conversations, but misses some part/intent of message. Requires cues at times to understand   Memory/Recall Ability Memory/Recall Ability : Current season;That he or she is in a hospital/hospital unit   Refer to Care Plan for [place] 1 OT Short Term Goal 1 (Week 1): STG=LTG d/t Pt ELOS  Recommendations for other services: Other: May potentially benefit from SLP evaluation d/t cognitive deficits    Skilled Therapeutic Intervention ADL ADL Eating: Independent Where Assessed-Eating: Wheelchair Grooming: Contact guard Where Assessed-Grooming: Standing at sink Upper Body Bathing: Contact guard Where Assessed-Upper Body Bathing: Shower Lower Body Bathing: Minimal assistance;Contact guard Where Assessed-Lower Body Bathing: Shower Upper Body Dressing: Minimal assistance Where Assessed-Upper Body Dressing: Edge of bed Lower Body Dressing: Contact guard Where Assessed-Lower Body Dressing: Edge of bed Toileting: Contact guard Where Assessed-Toileting: Glass blower/designer: Therapist, music Method: Counselling psychologist: Energy manager: Not assessed Social research officer, government: Curator Method: Heritage manager: Manufacturing systems engineer  Bed Mobility Bed Mobility: Sit to Supine;Supine to Sit Supine to Sit: Minimal Assistance -  Patient > 75% Sit to Supine: Minimal Assistance - Patient > 75% Transfers Sit to Stand: Contact Guard/Touching assist;Minimal Assistance - Patient > 75% Stand to Sit: Contact Guard/Touching assist;Minimal Assistance - Patient > 75%   Discharge Criteria: Patient will be discharged from OT if patient refuses treatment 3 consecutive times without medical reason, if treatment goals not met, if there is a change in medical status, if patient makes no progress towards goals or if patient is discharged from hospital.  The above assessment, treatment plan, treatment alternatives and goals were discussed and mutually agreed upon: by patient and by family  Janey Genta 08/11/2022, 4:06 PM

## 2022-08-11 NOTE — Evaluation (Signed)
Physical Therapy Assessment and Plan  Patient Details  Name: Terry [name] MRN: [id number] Date of Birth: [birth year]  PT Diagnosis: Abnormal posture, Abnormality of gait, Cognitive deficits, Impaired sensation, Low back pain, Muscle weakness, and Pain in sternum Rehab Potential: Good ELOS: 7-10 days   Today's Date: 08/11/2022 PT Individual Time: 0805-0919 PT Individual Time Calculation (min): 74 min    Hospital Problem: Principal Problem:   Debility   Past Medical History:  Past Medical History:  Diagnosis Date   Arthritis    neck, back, knees, right ankle   Bladder calculus    Chronic back pain    Coronary artery disease    GERD (gastroesophageal reflux disease)    History of colon polyps    benign   History of kidney stones    and bladder stone   History of prostate cancer urologist-- dr Alinda Money   dx 2014---  s/p  prostatectomy 05-22-2013 ,  Gleason 3+3   Hypercholesterolemia    takes Crestor daily   Hyperlipidemia    Pneumonia 10/2019   Pulmonary emboli ([place]) 10/2019   Wears glasses    Past Surgical History:  Past Surgical History:  Procedure Laterality Date   ACHILLES TENDON SURGERY Right 06-07-2018   '@[facility]'$    reconstruction   ANTERIOR CERVICAL DECOMP/DISCECTOMY FUSION N/A 06/27/2014   Procedure: ANTERIOR CERVICAL DECOMPRESSION/DISCECTOMY FUSION C5-C7   (2 LEVELS);  Surgeon: Melina Schools, MD;  Location: [place];  Service: Orthopedics;  Laterality: N/A;   back fusion  03/2022   CARDIAC CATHETERIZATION  2004   CLIPPING OF ATRIAL APPENDAGE Left 08/04/2022   Procedure: CLIPPING OF ATRIAL APPENDAGE USING AN ATRICURE 29 ATRICLIP;  Surgeon: Neomia Glass, MD;  Location: [place];  Service: Open Heart Surgery;  Laterality: Left;   COLONOSCOPY     CORONARY ARTERY BYPASS GRAFT N/A 08/04/2022   Procedure: CORONARY ARTERY BYPASS GRAFTING (CABG) X THREE USING LEFT INTERNAL MAMMARY ARTERY ([place]) AND ENDOSCOPICALLY HARVESTED RIGHT GREATER SAPHENOUS VEIN.;  Surgeon: Neomia Glass,  MD;  Location: [place];  Service: Open Heart Surgery;  Laterality: N/A;   CYSTOSCOPY WITH LITHOLAPAXY N/A 03/27/2019   Procedure: CYSTOSCOPY WITH REMOVAL OF BLADDER STONE AND FOREIGN BODY;  Surgeon: Raynelle Bring, MD;  Location: [facility];  Service: Urology;  Laterality: N/A;   EYE SURGERY     lazy eye as child, lasik surgery   FINGER SURGERY Right    4th finger   I & D EXTREMITY Right 07/10/2018   Procedure: IRRIGATION AND DEBRIDEMENT RIGHT ANKLE WOUND AND WOUND VAC PLACEMENT;  Surgeon: Wylene Simmer, MD;  Location: [place];  Service: Orthopedics;  Laterality: Right;   KNEE ARTHROSCOPY Bilateral right ?;  left 05-25-2007  '@[facility]'$    LEFT HEART CATH AND CORONARY ANGIOGRAPHY N/A 07/20/2022   Procedure: LEFT HEART CATH AND CORONARY ANGIOGRAPHY;  Surgeon: Lorretta Harp, MD;  Location: [facility];  Service: Cardiovascular;  Laterality: N/A;   POSTERIOR LAMINECTOMY / DECOMPRESSION LUMBAR SPINE  08-31-2016    dr elsner  '@[facility]'$    PROSTATE BIOPSY  04/13/2013   gleason 3+3=6, vol 55.4 cc   ROBOT ASSISTED LAPAROSCOPIC RADICAL PROSTATECTOMY N/A 05/22/2013   Procedure: ROBOTIC ASSISTED LAPAROSCOPIC RADICAL PROSTATECTOMY LEVEL 1;  Surgeon: Dutch Gray, MD;  Location: [facility];  Service: Urology;  Laterality: N/A;   TEE WITHOUT CARDIOVERSION N/A 08/04/2022   Procedure: TRANSESOPHAGEAL ECHOCARDIOGRAM;  Surgeon: Neomia Glass, MD;  Location: [place];  Service: Open Heart Surgery;  Laterality: N/A;   TONSILLECTOMY  Assessment & Plan Clinical Impression: Patient is a 72 y.o. year old male  who was evaluated by Dr. Gwenlyn Found on 07/17/2022 and had obtained a coronary CTA on 07/15/2022 revealing a coronary calcium score of 440 with significant disease in his RCA by FFR analysis.  Based on this, it was decided to proceed with outpatient radial diagnostic coronary angiography.  He was asked to hold his Xarelto 2 days before the procedure.  He was evaluated by Dr. Tenny Craw and scheduled for coronary artery  bypass grafting.  On 08/04/2022, he underwent CABG x 3 vessels, clipping of atrial appendage and vein harvest from right lower extremity.  Transferred to cardiac ICU with labile blood pressure.  Extubated on 3/06.  Mild delirium 3/08.  Foley discontinued.  Multiple loose stools overnight and bowel regimen decreased.  Started on aspirin 81 mg daily.  Lovenox for DVT prophylaxis.  Tolerating diet.  Xarelto resumed 3/10. Kidney function and hemoglobin remained stable.  Requiring no oxygen supplementation.  Ambulated yesterday 450 feet with front wheel rolling walker.The patient requires inpatient medicine and rehabilitation evaluations and services for ongoing dysfunction secondary to coronary artery disease status post CABG.  Patient transferred to CIR on 08/10/2022 .   Patient currently requires min with mobility secondary to muscle weakness, decreased cardiorespiratoy endurance, unbalanced muscle activation and decreased motor planning,  , decreased awareness and decreased problem solving, and decreased standing balance, decreased postural control, and decreased balance strategies.  Prior to hospitalization, patient was independent  with mobility and lived with Spouse (wife, [name]) in a House home.  Home access is 1 step-upStairs to enter.  Patient will benefit from skilled PT intervention to maximize safe functional mobility, minimize fall risk, and decrease caregiver burden for planned discharge home with 24 hour supervision.  Anticipate patient will benefit from follow up OP at discharge.  PT - End of Session Activity Tolerance: Tolerates 30+ min activity with multiple rests Endurance Deficit: Yes Endurance Deficit Description: requires seated rest breaks due to impaired endurance PT Assessment Rehab Potential (ACUTE/IP ONLY): Good PT Barriers to Discharge: Decreased caregiver support PT Patient demonstrates impairments in the following area(s): Safety;Balance;Behavior;Sensory;Skin  Integrity;Edema;Endurance;Motor;Nutrition;Pain;Perception PT Transfers Functional Problem(s): Bed Mobility;Bed to Chair;Car;Furniture;Floor PT Locomotion Functional Problem(s): Ambulation;Stairs PT Plan PT Intensity: Minimum of 1-2 x/day ,45 to 90 minutes PT Frequency: 5 out of 7 days PT Duration Estimated Length of Stay: 7-10 days PT Treatment/Interventions: Ambulation/gait training;Community reintegration;DME/adaptive equipment instruction;Neuromuscular re-education;Psychosocial support;Stair training;UE/LE Strength taining/ROM;Balance/vestibular training;Discharge planning;Functional electrical stimulation;Pain management;Skin care/wound management;Therapeutic Activities;UE/LE Coordination activities;Cognitive remediation/compensation;Disease management/prevention;Functional mobility training;Patient/family education;Splinting/orthotics;Therapeutic Exercise;Visual/perceptual remediation/compensation PT Transfers Anticipated Outcome(s): supervision using LRAD PT Locomotion Anticipated Outcome(s): supervision using LRAD PT Recommendation Recommendations for Other Services: Therapeutic Recreation consult Therapeutic Recreation Interventions: Outing/community reintergration;Stress management Follow Up Recommendations: Outpatient PT;24 hour supervision/assistance Patient destination: Home Equipment Recommended: To be determined;Other (comment) Equipment Details: has RW and rollator   PT Evaluation Precautions/Restrictions Precautions Precautions: Sternal;Fall Precaution Comments: verbal review for Move in the Tube precautions Restrictions Weight Bearing Restrictions: No Pain Pain Assessment Pain Scale: 0-10 Pain Score: 5  Pain Type: Acute pain;Surgical pain Pain Location: Chest Pain Orientation: Upper;Anterior Pain Onset: On-going Pain Intervention(s): Medication (See eMAR);Rest;Relaxation;Repositioned;Distraction;Emotional support Pain Interference Pain Interference Pain Effect on  Sleep: 3. Frequently Pain Interference with Therapy Activities: 1. Rarely or not at all Pain Interference with Day-to-Day Activities: 1. Rarely or not at all Home Living/Prior [place] Available Help at Discharge: Family;Available 24 hours/day (wife works out of the house) Type of Home: [employer] Access: Stairs to  enter Entrance Stairs-Number of Steps: 1 step-up Entrance Stairs-Rails: None Home Layout: Other (Comment);Two level;Able to live on main level with bedroom/bathroom (doesn't have need to go upstairs) Additional Comments: 1 dog at home  Lives With: Spouse (wife, [name]) Prior Function Level of Independence: Independent with gait;Independent with homemaking with ambulation;Independent with transfers  Able to Take Stairs?: Yes Driving: Yes Vision/Perception  Vision - History Ability to See in Adequate Light: 1 Impaired (must wear his glasses) Perception Perception: Within Functional Limits Praxis Praxis: Impaired Praxis Impairment Details: Motor planning  Cognition Overall Cognitive Status: Impaired/Different from baseline (pt reports still feeling a little off from his baseline - notice some minor impairments during session) Arousal/Alertness: Awake/alert Orientation Level: Oriented X4 Year: 2024 Month: March Day of Week: Correct Attention: Focused;Sustained Focused Attention: Appears intact Sustained Attention: Appears intact Memory: Appears intact Awareness: Impaired (difficult to determine but possible minor impairment) Safety/Judgment: Appears intact Sensation Sensation Light Touch: Impaired Detail Central sensation comments: reports R LE feels "grainy" like "sandpaper" compared to LLE, but states that was present prior to CABG but now is more intense - reports new onset of  L UE feeling numb compared to R UE present since CABG Light Touch Impaired Details: Impaired LUE;Impaired RLE Hot/Cold: Not tested Proprioception: Appears  Intact Stereognosis: Not tested Coordination Gross Motor Movements are Fluid and Coordinated: Yes Motor  Motor Motor: Other (comment);Abnormal postural alignment and control Motor - Skilled Clinical Observations: generalized deconditioning s/p heart surgery   Trunk/Postural Assessment  Cervical Assessment Cervical Assessment: Exceptions to [facility] (slight forward head) Thoracic Assessment Thoracic Assessment: Exceptions to [facility] (mild rounded shoulders with pt holding trunk in guraded posture due to sternal pain) Lumbar Assessment Lumbar Assessment: Exceptions to [facility] (posterior pelvic tilt) Postural Control Postural Control: Within Functional Limits  Balance Balance Balance Assessed: Yes Static Sitting Balance Static Sitting - Balance Support: Feet supported Static Sitting - Level of Assistance: 5: Stand by assistance Dynamic Sitting Balance Dynamic Sitting - Balance Support: Feet supported Dynamic Sitting - Level of Assistance: 5: Stand by assistance;Other (comment) (CGA) Static Standing Balance Static Standing - Balance Support: During functional activity Static Standing - Level of Assistance: Other (comment) (CGA) Dynamic Standing Balance Dynamic Standing - Balance Support: During functional activity Dynamic Standing - Level of Assistance: 4: Min assist;Other (comment) (CGA) Extremity Assessment      RLE Assessment RLE Assessment: Exceptions to [facility] Active Range of Motion (AROM) Comments: WFL but hx of R ankle surgery that was more severe than L ankle surgery General Strength Comments: assessed in sitting RLE Strength Right Hip Flexion: 4/5 Right Knee Flexion: 4+/5 Right Knee Extension: 4+/5 Right Ankle Dorsiflexion: 4-/5 Right Ankle Plantar Flexion: 4-/5 LLE Assessment LLE Assessment: Exceptions to [facility] General Strength Comments: assessed in sitting LLE Strength Left Hip Flexion: 4/5 Left Knee Flexion: 4+/5 Left Knee Extension: 4+/5 Left Ankle Dorsiflexion: 4/5 Left  Ankle Plantar Flexion: 4/5  Care Tool Care Tool Bed Mobility Roll left and right activity   Roll left and right assist level: Supervision/Verbal cueing    Sit to lying activity   Sit to lying assist level: Minimal Assistance - Patient > 75%    Lying to sitting on side of bed activity   Lying to sitting on side of bed assist level: the ability to move from lying on the back to sitting on the side of the bed with no back support.: Minimal Assistance - Patient > 75%     Care Tool Transfers Sit to stand transfer   Sit  to stand assist level: Minimal Assistance - Patient > 75%    Chair/bed transfer   Chair/bed transfer assist level: Contact Guard/Touching Cabin crew transfer assist level: Minimal Assistance - Patient > 75%      Care Tool Locomotion Ambulation   Assist level: Minimal Assistance - Patient > 75% Assistive device: No Device Max distance: 151f  Walk 10 feet activity   Assist level: Contact Guard/Touching assist Assistive device: No Device   Walk 50 feet with 2 turns activity   Assist level: Minimal Assistance - Patient > 75% Assistive device: No Device  Walk 150 feet activity   Assist level: Minimal Assistance - Patient > 75% Assistive device: No Device  Walk 10 feet on uneven surfaces activity   Assist level: Contact Guard/Touching assist Assistive device: Other (comment) (railing)  Stairs   Assist level: Minimal Assistance - Patient > 75% Stairs assistive device: 2 hand rails Max number of stairs: 12  Walk up/down 1 step activity   Walk up/down 1 step (curb) assist level: Minimal Assistance - Patient > 75% Walk up/down 1 step or curb assistive device: 2 hand rails  Walk up/down 4 steps activity   Walk up/down 4 steps assist level: Minimal Assistance - Patient > 75% Walk up/down 4 steps assistive device: 2 hand rails  Walk up/down 12 steps activity   Walk up/down 12 steps assist level: Minimal Assistance -  Patient > 75% Walk up/down 12 steps assistive device: 2 hand rails  Pick up small objects from floor   Pick up small object from the floor assist level: Minimal Assistance - Patient > 75%    Wheelchair Is the patient using a wheelchair?: Yes Type of Wheelchair: Manual   Wheelchair assist level: Dependent - Patient 0%    Wheel 50 feet with 2 turns activity   Assist Level: Dependent - Patient 0%  Wheel 150 feet activity   Assist Level: Dependent - Patient 0%    Refer to Care Plan for Long Term Goals  SHORT TERM GOAL WEEK 1 PT Short Term Goal 1 (Week 1): = to LTGs based on ELOS  Recommendations for other services: Therapeutic Recreation  Stress management and Outing/community reintegration  Skilled Therapeutic Intervention Pt received sitting EOB and agreeable to therapy session. Evaluation completed (see details above) with patient education regarding purpose of PT evaluation, PT POC and goals, therapy schedule, weekly team meetings, and other CIR information including safety plan and fall risk safety. Pt performed the below functional mobility tasks with the specified levels of skilled cuing and assistance. Reinforced sternal precautions education and implications during mobility. Pt demos impaired cardiopulmonary endurance requiring seated rest breaks throughout session. Pt demos some possible impaired awareness/cognitive deficits with pt attempting to lie down in the bed in the wrong orientation and his replies to therapist's questions were not clear or didn't fit the question exactly. At end of session, pt left seated in w/c with needs in reach and seat belt alarm on.  Mobility Bed Mobility Bed Mobility: Sit to Supine;Supine to Sit Supine to Sit: Minimal Assistance - Patient > 75% Sit to Supine: Minimal Assistance - Patient > 75% Transfers Transfers: Sit to Stand;Stand to Sit;Stand Pivot Transfers Sit to Stand: Contact Guard/Touching assist;Minimal Assistance - Patient > 75% Stand  to Sit: Contact Guard/Touching assist;Minimal Assistance - Patient > 75% Stand Pivot Transfers: Minimal Assistance - Patient > 75% Stand Pivot Transfer Details: Verbal  cues for technique;Verbal cues for sequencing;Tactile cues for sequencing;Tactile cues for weight shifting;Verbal cues for precautions/safety;Tactile cues for posture Transfer (Assistive device): None Locomotion  Gait Ambulation: Yes Gait Assistance: Contact Guard/Touching assist;Minimal Assistance - Patient > 75% Gait Distance (Feet): 150 Feet Assistive device: None Gait Assistance Details: Verbal cues for technique;Verbal cues for sequencing;Verbal cues for gait pattern;Tactile cues for sequencing;Tactile cues for weight shifting;Verbal cues for precautions/safety Gait Gait: Yes Gait Pattern: Impaired Gait Pattern: Step-through pattern;Decreased step length - right;Decreased step length - left (slight lateral ankle instability) Gait velocity: decreased Stairs / Additional Locomotion Stairs: Yes Stairs Assistance: Contact Guard/Touching assist;Minimal Assistance - Patient > 75% Stair Management Technique: Two rails;Alternating pattern;Step to pattern (alternating ascent and step-to leading with R LE on descent) Number of Stairs: 12 Height of Stairs: 6 Ramp: Contact Guard/touching assist (using railing) Curb: Minimal Assistance - Patient >75% Wheelchair Mobility Wheelchair Mobility: No   Discharge Criteria: Patient will be discharged from PT if patient refuses treatment 3 consecutive times without medical reason, if treatment goals not met, if there is a change in medical status, if patient makes no progress towards goals or if patient is discharged from hospital.  The above assessment, treatment plan, treatment alternatives and goals were discussed and mutually agreed upon: by patient  Tawana Scale , PT, DPT, NCS, CSRS 08/11/2022, 8:28 AM

## 2022-08-11 NOTE — Plan of Care (Signed)
  Problem: RH Balance Goal: LTG Patient will maintain dynamic sitting balance (PT) Description: LTG:  Patient will maintain dynamic sitting balance with assistance during mobility activities (PT) Flowsheets (Taken 08/11/2022 2052) LTG: Pt will maintain dynamic sitting balance during mobility activities with:: Independent with assistive device  Goal: LTG Patient will maintain dynamic standing balance (PT) Description: LTG:  Patient will maintain dynamic standing balance with assistance during mobility activities (PT) Flowsheets (Taken 08/11/2022 2052) LTG: Pt will maintain dynamic standing balance during mobility activities with:: Supervision/Verbal cueing   Problem: Sit to Stand Goal: LTG:  Patient will perform sit to stand with assistance level (PT) Description: LTG:  Patient will perform sit to stand with assistance level (PT) Flowsheets (Taken 08/11/2022 2052) LTG: PT will perform sit to stand in preparation for functional mobility with assistance level: Supervision/Verbal cueing   Problem: RH Bed Mobility Goal: LTG Patient will perform bed mobility with assist (PT) Description: LTG: Patient will perform bed mobility with assistance, with/without cues (PT). Flowsheets (Taken 08/11/2022 2052) LTG: Pt will perform bed mobility with assistance level of: Independent with assistive device    Problem: RH Bed to Chair Transfers Goal: LTG Patient will perform bed/chair transfers w/assist (PT) Description: LTG: Patient will perform bed to chair transfers with assistance (PT). Flowsheets (Taken 08/11/2022 2052) LTG: Pt will perform Bed to Chair Transfers with assistance level: Supervision/Verbal cueing   Problem: RH Car Transfers Goal: LTG Patient will perform car transfers with assist (PT) Description: LTG: Patient will perform car transfers with assistance (PT). Flowsheets (Taken 08/11/2022 2052) LTG: Pt will perform car transfers with assist:: Supervision/Verbal cueing   Problem: RH  Ambulation Goal: LTG Patient will ambulate in controlled environment (PT) Description: LTG: Patient will ambulate in a controlled environment, # of feet with assistance (PT). Flowsheets (Taken 08/11/2022 2052) LTG: Pt will ambulate in controlled environ  assist needed:: Supervision/Verbal cueing LTG: Ambulation distance in controlled environment: 127ft using LRAD Goal: LTG Patient will ambulate in home environment (PT) Description: LTG: Patient will ambulate in home environment, # of feet with assistance (PT). Flowsheets (Taken 08/11/2022 2052) LTG: Pt will ambulate in home environ  assist needed:: Supervision/Verbal cueing LTG: Ambulation distance in home environment: 72ft using LRAD

## 2022-08-11 NOTE — Plan of Care (Signed)
Problem: RH Balance Goal: LTG Patient will maintain dynamic standing with ADLs (OT) Description: LTG:  Patient will maintain dynamic standing balance with assist during activities of daily living (OT)  Flowsheets (Taken 08/11/2022 1613) LTG: Pt will maintain dynamic standing balance during ADLs with: Supervision/Verbal cueing   Problem: Sit to Stand Goal: LTG:  Patient will perform sit to stand in prep for activites of daily living with assistance level (OT) Description: LTG:  Patient will perform sit to stand in prep for activites of daily living with assistance level (OT) Flowsheets (Taken 08/11/2022 1613) LTG: PT will perform sit to stand in prep for activites of daily living with assistance level: Supervision/Verbal cueing   Problem: RH Bathing Goal: LTG Patient will bathe all body parts with assist levels (OT) Description: LTG: Patient will bathe all body parts with assist levels (OT) Flowsheets (Taken 08/11/2022 1613) LTG: Pt will perform bathing with assistance level/cueing: Supervision/Verbal cueing   Problem: RH Dressing Goal: LTG Patient will perform upper body dressing (OT) Description: LTG Patient will perform upper body dressing with assist, with/without cues (OT). Flowsheets (Taken 08/11/2022 1613) LTG: Pt will perform upper body dressing with assistance level of: Supervision/Verbal cueing Goal: LTG Patient will perform lower body dressing w/assist (OT) Description: LTG: Patient will perform lower body dressing with assist, with/without cues in positioning using equipment (OT) Flowsheets (Taken 08/11/2022 1613) LTG: Pt will perform lower body dressing with assistance level of: Supervision/Verbal cueing   Problem: RH Toileting Goal: LTG Patient will perform toileting task (3/3 steps) with assistance level (OT) Description: LTG: Patient will perform toileting task (3/3 steps) with assistance level (OT)  Flowsheets (Taken 08/11/2022 1613) LTG: Pt will perform toileting task  (3/3 steps) with assistance level: Supervision/Verbal cueing   Problem: RH Simple Meal Prep Goal: LTG Patient will perform simple meal prep w/assist (OT) Description: LTG: Patient will perform simple meal prep with assistance, with/without cues (OT). Flowsheets (Taken 08/11/2022 1613) LTG: Pt will perform simple meal prep with assistance level of: Supervision/Verbal cueing LTG: Pt will perform simple meal prep w/level of: Ambulate with device   Problem: RH Light Housekeeping Goal: LTG Patient will perform light housekeeping w/assist (OT) Description: LTG: Patient will perform light housekeeping with assistance, with/without cues (OT). Flowsheets (Taken 08/11/2022 1613) LTG: Pt will perform light housekeeping with assistance level of: Supervision/Verbal cueing LTG: Pt will perform light housekeeping w/level of: Ambulate with device   Problem: RH Toilet Transfers Goal: LTG Patient will perform toilet transfers w/assist (OT) Description: LTG: Patient will perform toilet transfers with assist, with/without cues using equipment (OT) Flowsheets (Taken 08/11/2022 1613) LTG: Pt will perform toilet transfers with assistance level of: Supervision/Verbal cueing   Problem: RH Tub/Shower Transfers Goal: LTG Patient will perform tub/shower transfers w/assist (OT) Description: LTG: Patient will perform tub/shower transfers with assist, with/without cues using equipment (OT) Flowsheets (Taken 08/11/2022 1613) LTG: Pt will perform tub/shower stall transfers with assistance level of: Supervision/Verbal cueing LTG: Pt will perform tub/shower transfers from: Walk in shower   Problem: RH Memory Goal: LTG Patient will demonstrate ability for day to day recall/carry over during activities of daily living with assistance level (OT) Description: LTG:  Patient will demonstrate ability for day to day recall/carry over during activities of daily living with assistance level (OT). Flowsheets (Taken 08/11/2022  1613) LTG:  Patient will demonstrate ability for day to day recall/carry over during activities of daily living with assistance level (OT): Supervision   Problem: RH Awareness Goal: LTG: Patient will demonstrate awareness during functional activites  type of (OT) Description: LTG: Patient will demonstrate awareness during functional activites type of (OT) Flowsheets (Taken 08/11/2022 1613) Patient will demonstrate awareness during functional activites type of: Anticipatory LTG: Patient will demonstrate awareness during functional activites type of (OT): Supervision

## 2022-08-11 NOTE — Discharge Summary (Signed)
Physician Discharge Summary  Patient ID: Terry [name] MRN: [id number] DOB/AGE: Jun 28, 1950 72 y.o.  Admit date: 08/10/2022 Discharge date: 08/18/2022  Discharge Diagnoses:  Principal Problem:   Debility Active problems:  Debilty secondary to CAD Postoperative CABG Hypertension Hyperlipidemia History of PE on Xarelto Parkinson's disease Constipation Discharged Condition: {condition:18240}  Significant Diagnostic Studies:  Labs:  Basic Metabolic Panel: Recent Labs  Lab 08/04/22 0940 08/04/22 1001 08/04/22 1006 08/04/22 1035 08/04/22 1138 08/04/22 1203 08/04/22 1356 08/04/22 1640 08/04/22 1952 08/04/22 2358 08/05/22 0115 08/05/22 0359 08/05/22 1613 08/06/22 0447 08/08/22 0309 08/09/22 0127 08/11/22 0629  NA 139 140 138 138 137 138 141 140 137 137  138 136 134* 131* 137 139 139  K 3.5 3.7 6.1* 4.5 4.4 4.0 3.7 4.7 4.4 4.1 4.0 4.2 3.9 3.8 3.4* 3.7 3.9  CL 104  --   --  103 101 101  --   --  108  --   --  107 103 101 105 101 102  CO2  --   --   --   --   --   --   --   --  18*  --   --  22 22 23 28 30 24   GLUCOSE 99  --   --  109* 127* 131*  --   --  143*  --   --  126* 127* 131* 119* 119* 105*  BUN 10  --   --  9 10 11   --   --  10  --   --  11 11 11 15 15 13   CREATININE 0.60*  --   --  0.60* 0.60* 0.60*  --   --  0.83  --   --  0.85 0.87 0.77 0.80 0.94 0.94  CALCIUM  --   --   --   --   --   --   --   --  7.7*  --   --  8.0* 8.3* 8.1* 8.6* 9.2 9.2  MG  --   --   --   --   --   --   --   --  3.0*  --   --  2.5* 2.1  --   --   --   --     CBC: Recent Labs  Lab 08/06/22 0447 08/08/22 0309 08/11/22 0629  WBC 8.2 9.0 9.4  NEUTROABS  --   --  7.0  HGB 10.1* 10.7* 11.1*  HCT 31.2* 30.6* 32.7*  MCV 87.9 83.8 86.5  PLT 142* 198 327    CBG: Recent Labs  Lab 08/06/22 1937 08/06/22 2304 08/07/22 0445 08/07/22 0733 08/07/22 1117  GLUCAP 122* 178* 102* 134* 112*    Brief HPI:   Terry [name] is a 72 y.o. male was evaluated by Dr. Gwenlyn Found on 07/17/2022 and had  obtained a coronary CTA on 07/15/2022 revealing a coronary calcium score of 440 with significant disease in his RCA by FFR analysis.  Based on this, it was decided to proceed with outpatient radial diagnostic coronary angiography.  He was asked to hold his Xarelto 2 days before the procedure.  He was evaluated by Dr. Tenny Craw and scheduled for coronary artery bypass grafting.  On 08/04/2022, he underwent CABG x 3 vessels, clipping of atrial appendage and vein harvest from right lower extremity.  Transferred to cardiac ICU with labile blood pressure.  Extubated on 3/06.  Mild delirium 3/08.  Foley discontinued.  Multiple loose stools overnight and bowel regimen decreased.  Started on aspirin 81 mg daily.  Lovenox for DVT prophylaxis.  Tolerating diet.  Xarelto resumed 3/10. Kidney function and hemoglobin remained stable.  Requiring no oxygen supplementation.  Ambulated yesterday 450 feet with front wheel rolling walker.  Hospital Course: Terry [name] was admitted to rehab 08/10/2022 for inpatient therapies to consist of PT, ST and OT at least three hours five days a week. Past admission physiatrist, therapy team and rehab RN have worked together to provide customized collaborative inpatient rehab. Follow-up labs stable.   Blood pressures were monitored on TID basis and Lopressor 12.5 mg continued.  Rehab course: During patient's stay in rehab weekly team conferences were held to monitor patient's progress, set goals and discuss barriers to discharge. At admission, patient required  min with mobility,  min with basic self-care skills and IADL   He has had improvement in activity tolerance, balance, postural control as well as ability to compensate for deficits. He has had improvement in functional use RUE/LUE  and RLE/LLE as well as improvement in awareness       Disposition:  There are no questions and answers to display.         Diet:  Special Instructions: No driving, alcohol consumption or  tobacco use.  Sternal precautions   Allergies as of 08/11/2022       Reactions   Lipitor [atorvastatin] Other (See Comments)   Muscle issues   Penicillins Hives, Itching, Other (See Comments)   Has patient had a PCN reaction causing immediate rash, facial/tongue/throat swelling, SOB or lightheadedness with hypotension: No Has patient had a PCN reaction causing SEVERE RASH INVOLVING MUCUS MEMBRANES or SKIN NECROSIS: #  #  #  YES  #  #  #  Has patient had a PCN reaction that required hospitalization No Has patient had a PCN reaction occurring within the last 10 years: No     Med Rec must be completed prior to using this Noel***        Signed: Barbie Banner 08/11/2022, 10:15 AM

## 2022-08-11 NOTE — Progress Notes (Signed)
Inpatient Rehabilitation  Patient information reviewed and entered into eRehab system by Kristy Schomburg M. Shaakira Borrero, M.A., CCC/SLP, PPS Coordinator.  Information including medical coding, functional ability and quality indicators will be reviewed and updated through discharge.    

## 2022-08-12 ENCOUNTER — Ambulatory Visit: Payer: Medicare Other | Admitting: Cardiovascular Disease

## 2022-08-12 DIAGNOSIS — R5381 Other malaise: Secondary | ICD-10-CM | POA: Diagnosis not present

## 2022-08-12 NOTE — Progress Notes (Signed)
Occupational Therapy Session Note  Patient Details  Name: Terry [name] MRN: [id number] Date of Birth: 02-12-1951  Today's Date: 08/12/2022 OT Individual Time: [id number] OT Individual Time Calculation (min): 72 min    Short Term Goals: Week 1:  OT Short Term Goal 1 (Week 1): STG=LTG d/t Pt ELOS  Skilled Therapeutic Interventions/Progress Updates:     Pt received sitting up in wc presenting to be in good spirits and receptive to skilled OT session with a focus on IADL retraining, functional cognition, and activity tolerance. Pt reporting 0/10 pain and demonstrating decreased anxiety this session regarding sternal incision.   Pt donned socks and shoes seated in wc with supervision. Pt ambulated to ADL apartment for functional mobility and endurance training using RW with close supervision. VB cueing required for step length and head positioning as pt prefers to look towards ground during ambulation.   Pt educated on simple household cleaning and meal prep tasks that are appropriate vs. Not appropriate to complete following CABG surgery d/t sternal precautions. Pt able to verbalize 3 of each exercises with min questioning cues. Discussed strategies for completing appropriate activities such as washing and putting away dishes with demonstration and practice opportunities provided. Pt able to complete simple light weight hold hold cleaning tasks with min cueing to maintain precautions. Pt completed simulated meal prep activity standing at sink without AD for dynamic standing balance challenge. Pt able to complete task with close supervision, however Pt noted to have mild posterior lean during activity requiring VB cues for anterior weight shifting. Recommending Pt utilize RW during activities to increase safety or have family member close to provided CGA.   Pt ambulated to therapy gym using RW close supervision. Pt completed memory task on BIT to address functional cognition deficits. Pt able to  complete word/picture sequencing task with up to 7 images with 83% accuracy. Discussed potential cognitive deficits with pt with Pt reporting he has felt a little groggy since the surgery. Informed Pt on tasks that he would benefit receiving increased assistance during such as medication management with Pt receptive.   Pt completed functional mobility/dynamic standing balance obstacle course to increase endurance, balance, and safety awareness for d/c home. Pt able to complete obstacle course without AD with CGA and LOB noted x3. Increased challenges noted when stepping onto/off curb and over cones. Pt demonstrated increased balance when standing on compliant surfaces and ambulating around obstacles.   Pt ambulated back to room using RW CGA without LOB or SOB noted >234f. Pt requested to use restroom at end of session completing 3/3 toileting tasks with close supervision without AD. Pt was left resting in wc with call bell in reach, seat belt alarm on, and all needs met.   Therapy Documentation Precautions:  Precautions Precautions: Sternal, Fall Precaution Comments: verbal review for Move in the Tube precautions Restrictions Weight Bearing Restrictions: No RUE Weight Bearing:  (sternal precautions) LUE Weight Bearing:  (sternal precautions) General:   Vital Signs: Therapy Vitals Temp: 98.6 F (37 C) Temp Source: Oral Pulse Rate: 77 Resp: 18 BP: 99/65 Patient Position (if appropriate): Lying Oxygen Therapy SpO2: 97 % O2 Device: Room Air Pain:   ADL: ADL Eating: Independent Where Assessed-Eating: Wheelchair Grooming: Contact guard Where Assessed-Grooming: Standing at sink Upper Body Bathing: Contact guard Where Assessed-Upper Body Bathing: Shower Lower Body Bathing: Minimal assistance, Contact guard Where Assessed-Lower Body Bathing: Shower Upper Body Dressing: Minimal assistance Where Assessed-Upper Body Dressing: Edge of bed Lower Body Dressing: Contact guard Where  Assessed-Lower  Body Dressing: Edge of bed Toileting: Contact guard Where Assessed-Toileting: Glass blower/designer: Therapist, music Method: Counselling psychologist: Energy manager: Not assessed Social research officer, government: Curator Method: Heritage manager: Radio broadcast assistant   Therapy/Group: Individual Therapy  Janey Genta 08/12/2022, 8:01 AM

## 2022-08-12 NOTE — Progress Notes (Signed)
Physical Therapy Session Note  Patient Details  Name: Terry [name] MRN: [id number] Date of Birth: 10-10-1950  Today's Date: 08/12/2022 PT Individual Time: 1350-1506 PT Individual Time Calculation (min): 76 min   Short Term Goals: Week 1:  PT Short Term Goal 1 (Week 1): = to LTGs based on ELOS  Skilled Therapeutic Interventions/Progress Updates:    Pt presents in room in bed and agreeable to PT. Pt completes supine to sit EOB with supervision, supervision for sitting balance EOB. Pt dons shoes EOB with set up assist. Pt completes sit<>stand throughout session with CGA/supervision from variable heights, min cues initially for sternal precautions progress to no cues for maintaining sternal precautions throughout session. Pt vitals assessed for orthostasis in sitting and standing, sitting 121/12mHg (MAP 79), HR 82bpm, standing 95/571mg (MAP 70), HR 87bpm with pt denying symptoms in both positions. Pt completes ambulation to main gym with CGA no device 180' demonstrating decreased step length and increased lateral sway, x1 instance LOB with pt able to self correct. Pt then completes activities to promote single limb stability, single limb strength, multidirectional stepping stability and step length to improve stability with gait, improve stepping reactions, and improve postural stability for stair negotiation. Pt requires therapeutic rest breaks between activities due to fatigue and to improve performance with tasks.  Activities included: - Step taps to 4" step alternating BLEs 2x20 (seated rest break between sets), verbal cues for controlling speed with task to improve stability for task. - Backward step x10 BLEs, decreased ROM and motor planning for backwards step for RLE - Forward step up/back x10 BLEs 4" step no UE support, requires CGA/min-assist for task - Forward step up/back x5 BLEs 6" step verbal cues for no UE support with intermittent unilateral support with LUE during task - Lateral  step ups x10 BLE 6" step, improved confidence and stability noted - Forward/backward walking 3x10' with pt demo decreased step length, pt completes 2nd trial 3x10' with verbal cues to increase step length, requires CGA for backwards stepping. - Lateral stepping 3x15' to R and L, CGA for stability - Posterior crossover step x10 BLE, pt attempts [place] with pt demonstrating difficulty motor planning task - Triple step to R then L, x2 trials with pt verbalizing steps for "shag" dancing, completed to promote improved cognitive dual tasking for dynamic upright mobility  Following above activities pt then ambulates 180' no device with supervision from main gym back to room, no LOB noted during ambulation. Pt returns to sitting and supine on bed with supervision, verbal cueing for positioning once supine. Pt remains supine with all needs within reach, call light in place, and bed alarm activated at end of session.    Therapy Documentation Precautions:  Precautions Precautions: Sternal, Fall Precaution Comments: verbal review for Move in the Tube precautions Restrictions Weight Bearing Restrictions: No RUE Weight Bearing:  (sternal precautions) LUE Weight Bearing:  (sternal precautions)   Pain:  Pt reports pain but does not provide NPRS - RN present at beginning of session to administer pain medicine. Pt denies increase in pain at end of session.    Therapy/Group: Individual Therapy  CaTawana Scale PT, DPT, NCS, CSRS 08/12/2022, 12:47 PM

## 2022-08-12 NOTE — Progress Notes (Signed)
Physical Therapy Session Note  Patient Details  Name: Terry [name] MRN: [id number] Date of Birth: Jul 24, 1950  Today's Date: 08/12/2022 PT Individual Time: 1710-1735 PT Individual Time Calculation (min): 25 min   Short Term Goals: Week 1:  PT Short Term Goal 1 (Week 1): = to LTGs based on ELOS  Skilled Therapeutic Interventions/Progress Updates:    Pt received supine in bed awake, but reports he has been taking a nap since earlier therapy session due to fatigue. Reports some discomfort in his feet from the increased activity, but despite this is agreeable to therapy session. Supine>sitting R EOB, HOB partially elevated, with supervision and increased time/effort - min cuing for sternal precautions. Sitting EOB donned tennis shoes total assist for time management. Sit<>stands, no AD, with CGA/close supervision for safety during session - from lower seat heights, pt requires min trunk rocking and a few attempts prior to coming fully to stand while limiting use of UEs to maintain precautions. Gait training ~200-319f to/from elevators and [facility], no AD, with CGA for steadying as pt demos overall increased lateral instability with increased postural sway but no significant, overt LOB. Gait training outside on level paved, brick walkway, no AD, with CGA for steadying due to balance instability as just described. Therapist educated pt on likely need to use AD at D/C to assist with his balance to increase his safety while allowing him the greatest amount of independence - pt in agreement with this recommendation - will trial rollator and/or RW tomorrow. Educated pt on recommendation for follow-up OPPT to further progress his dynamic standing balance, dynamic gait training, and cardiopulmonary endurance. After seated rest break due to impaired endurance, performed gait training back up to room as described above. At end of session, pt left seated EOB with needs in reach, bed alarm on, and meal  tray set-up.   Therapy Documentation Precautions:  Precautions Precautions: Sternal, Fall Precaution Comments: verbal review for Move in the Tube precautions Restrictions Weight Bearing Restrictions: No RUE Weight Bearing:  (sternal precautions) LUE Weight Bearing:  (sternal precautions)   Pain:  No complaints of pain during session.    Therapy/Group: Individual Therapy  CTawana Scale, PT, DPT, NCS, CSRS 08/12/2022, 8:24 PM

## 2022-08-12 NOTE — IPOC Note (Signed)
Overall Plan of Care [facility]) Patient Details Name: Terry [name] MRN: [id number] DOB: 06/14/1950  Admitting Diagnosis: Debility  Hospital Problems: Principal Problem:   Debility     Functional Problem List: Nursing Bowel, Pain, Endurance, Medication Management, Safety, Skin Integrity  PT Safety, Balance, Behavior, Sensory, Skin Integrity, Edema, Endurance, Motor, Nutrition, Pain, Perception  OT Balance, Safety, Cognition, Sensory, Endurance, Motor, Pain  SLP    TR         Basic ADL's: OT Grooming, Bathing, Dressing, Toileting     Advanced  ADL's: OT Simple Meal Preparation, Light Housekeeping     Transfers: PT Bed Mobility, Bed to Chair, Car, [employer], Editor, commissioning, Agricultural engineer: PT Ambulation, Stairs     Additional Impairments: OT    SLP        TR      Anticipated Outcomes Item Anticipated Outcome  Self Feeding Independent  Swallowing      Basic self-care  Media planner Transfers Supervision  Bowel/Bladder  manage bowel w mod I assist  Transfers  supervision using LRAD  Locomotion  supervision using LRAD  Communication     Cognition     Pain  < 4 with prns  Safety/Judgment  manage w cues   Therapy Plan: PT Intensity: Minimum of 1-2 x/day ,45 to 90 minutes PT Frequency: 5 out of 7 days PT Duration Estimated Length of Stay: 7-10 days OT Intensity: Minimum of 1-2 x/day, 45 to 90 minutes OT Frequency: 5 out of 7 days OT Duration/Estimated Length of Stay: 7-10 days     Team Interventions: Nursing Interventions Patient/Family Education, Disease Management/Prevention, Skin Care/Wound Management, Discharge Planning, Pain Management, Bowel Management, Medication Management  PT interventions Ambulation/gait training, Community reintegration, DME/adaptive equipment instruction, Neuromuscular re-education, Psychosocial support, Stair training, UE/LE Strength taining/ROM, Training and development officer,  Discharge planning, Functional electrical stimulation, Pain management, Skin care/wound management, Therapeutic Activities, UE/LE Coordination activities, Cognitive remediation/compensation, Disease management/prevention, Functional mobility training, Patient/family education, Splinting/orthotics, Therapeutic Exercise, Visual/perceptual remediation/compensation  OT Interventions Balance/vestibular training, Discharge planning, Pain management, Self Care/advanced ADL retraining, Therapeutic Activities, UE/LE Coordination activities, Cognitive remediation/compensation, Disease mangement/prevention, Functional mobility training, Patient/family education, Skin care/wound managment, Therapeutic Exercise, Community reintegration, Engineer, drilling, Neuromuscular re-education, Psychosocial support, UE/LE Strength taining/ROM  SLP Interventions    TR Interventions    SW/CM Interventions Psychosocial Support, Discharge Planning, Patient/Family Education, Disease Management/Prevention   Barriers to Discharge MD   Prior hx of Parkinson's disease with reduced balance and mild dementia  Nursing Decreased caregiver support, Home environment access/layout, Weight bearing restrictions 1 level/level entry w spouse  PT Decreased caregiver support    OT      SLP      SW       Team Discharge Planning: Destination: PT-Home ,OT- Home , SLP-  Projected Follow-up: PT-Outpatient PT, 24 hour supervision/assistance, OT-  Outpatient OT, SLP-  Projected Equipment Needs: PT-To be determined, Other (comment), OT- To be determined, SLP-  Equipment Details: PT-has RW and rollator, OT-  Patient/family involved in discharge planning: PT- Patient,  OT-Patient, Family member/caregiver, SLP-   MD ELOS: 7-10d Medical Rehab Prognosis:  Good Assessment: The patient has been admitted for CIR therapies with the diagnosis of Debility related to CAD. The team will be addressing functional mobility, strength,  stamina, balance, safety, adaptive techniques and equipment, self-care, bowel and bladder mgt, patient and caregiver education, hx chronic low back pain , parkinson's disease . Goals have been set at  Sup. Anticipated discharge destination is Home .        See Team Conference Notes for weekly updates to the plan of care

## 2022-08-12 NOTE — Progress Notes (Signed)
PROGRESS NOTE   Subjective/Complaints: Labs reviewed , pt verbose, no c/o except occ cough , clear sputum   ROS- neg CP, SOB, N/V/D  Objective:   No results found. Recent Labs    08/11/22 0629  WBC 9.4  HGB 11.1*  HCT 32.7*  PLT 327    Recent Labs    08/11/22 0629  NA 139  K 3.9  CL 102  CO2 24  GLUCOSE 105*  BUN 13  CREATININE 0.94  CALCIUM 9.2    No intake or output data in the 24 hours ending 08/12/22 0751       Physical Exam: Vital Signs Blood pressure 99/65, pulse 77, temperature 98.6 F (37 C), temperature source Oral, resp. rate 18, height '5\' 8"'$  (1.727 m), weight 73.4 kg, SpO2 97 %.   General: No acute distress Mood and affect are appropriate Heart: Regular rate and rhythm no rubs murmurs or extra sounds Lungs: Clear to auscultation, breathing unlabored, no rales or wheezes Abdomen: Positive bowel sounds, soft nontender to palpation, nondistended Extremities: No clubbing, cyanosis, or edema Skin: No evidence of breakdown, no evidence of rash Neurologic: Cranial nerves II through XII intact, motor strength is 5/5 in bilateral deltoid, bicep, tricep, grip, hip flexor, knee extensors, ankle dorsiflexor and plantar flexor Oriented to person place time,pt unable to remember name of cardiac surgeon, remembers neurosurgeon name  Musculoskeletal: Full range of motion in all 4 extremities. No joint swelling    Assessment/Plan: 1. Functional deficits which require 3+ hours per day of interdisciplinary therapy in a comprehensive inpatient rehab setting. Physiatrist is providing close team supervision and 24 hour management of active medical problems listed below. Physiatrist and rehab team continue to assess barriers to discharge/monitor patient progress toward functional and medical goals  Care Tool:  Bathing    Body parts bathed by patient: Right arm, Left arm, Chest, Abdomen, Front perineal area,  Buttocks, Right upper leg, Left upper leg, Right lower leg, Left lower leg, Face   Body parts bathed by helper: Buttocks     Bathing assist Assist Level: Minimal Assistance - Patient > 75%     Upper Body Dressing/Undressing Upper body dressing   What is the patient wearing?: Button up shirt (zipper jacket)    Upper body assist Assist Level: Minimal Assistance - Patient > 75%    Lower Body Dressing/Undressing Lower body dressing      What is the patient wearing?: Underwear/pull up, Pants     Lower body assist Assist for lower body dressing: Contact Guard/Touching assist     Toileting Toileting    Toileting assist Assist for toileting: Contact Guard/Touching assist     Transfers Chair/bed transfer  Transfers assist     Chair/bed transfer assist level: Contact Guard/Touching assist     Locomotion Ambulation   Ambulation assist      Assist level: Minimal Assistance - Patient > 75% Assistive device: No Device Max distance: 129f   Walk 10 feet activity   Assist     Assist level: Contact Guard/Touching assist Assistive device: No Device   Walk 50 feet activity   Assist    Assist level: Minimal Assistance - Patient > 75% Assistive  device: No Device    Walk 150 feet activity   Assist    Assist level: Minimal Assistance - Patient > 75% Assistive device: No Device    Walk 10 feet on uneven surface  activity   Assist     Assist level: Contact Guard/Touching assist Assistive device: Other (comment) (railing)   Wheelchair     Assist Is the patient using a wheelchair?: Yes Type of Wheelchair: Manual    Wheelchair assist level: Dependent - Patient 0%      Wheelchair 50 feet with 2 turns activity    Assist        Assist Level: Dependent - Patient 0%   Wheelchair 150 feet activity     Assist      Assist Level: Dependent - Patient 0%   Blood pressure 99/65, pulse 77, temperature 98.6 F (37 C), temperature  source Oral, resp. rate 18, height '5\' 8"'$  (1.727 m), weight 73.4 kg, SpO2 97 %.  Medical Problem List and Plan: 1. Functional deficits secondary to debility             -patient may  shower             -ELOS/Goals: 7-10d ModI/Sup goals Team conference today please see physician documentation under team conference tab, met with team  to discuss problems,progress, and goals. Formulized individual treatment plan based on medical history, underlying problem and comorbidities.    2.  Antithrombotics: -DVT/anticoagulation:  Pharmaceutical: Xarelto             -antiplatelet therapy: aspirin 81 mg daily   3. Pain Management: Tylenol as needed   4. Mood/Behavior/Sleep: LCSW to evaluate and provide emotional support             -antipsychotic agents: n/a   5. Neuropsych/cognition: This patient is capable of making decisions on his own behalf with assistance for complex decisions.   6. Skin/Wound Care: Routine skin care checks   7. Fluids/Electrolytes/Nutrition: Routine Is and Os and follow-up chemistries   8: Hypertension: monitor TID and prn; on Lopressor 12.5 mg BID Vitals:   08/11/22 2011 08/12/22 0526  BP: 98/70 99/65  Pulse: 80 77  Resp: 18 18  Temp: 98 F (36.7 C) 98.6 F (37 C)  SpO2: 98% 97%      9: Hyperlipidemia: continue statin   10: CAD s/p CABG on 08/04/22             -sternal precautions x 6 wks             -continue Lopressor 12.5 mg BID   11: History of PE on Xarelto   12: Multiple prior spine surgeries.  Dr. Ellene Route (lumbar), hx L3-5 fusion with extension to L2-3 fusion in Oct 2023- no back pain or residual leg pain   13: History of Parkinson's disease: Continue Sinemet and Aricept    LOS: 2 days A FACE TO FACE EVALUATION WAS PERFORMED  Charlett Blake 08/12/2022, 7:51 AM

## 2022-08-12 NOTE — Progress Notes (Signed)
[address]       [place] [address]             [phone]  HPI: This is a 72 year old male with a past medical history of hyperlipidemia, pulmonary embolus, Parkinson's, chronic back pain, Arthritis, GERD, prostate cancer who was found to have escalating symptoms of angina. Patient returns for routine postoperative follow-up having undergone a CABG x 3 (LIMA - LAD, SVG - Diag, SVG - PDA) with endoscopic saphenous vein harvest, and placement of 45 mm Atriclip on left atrial appendage by Dr. Tenny Craw on 08/04/2022. He was discharged to [facility] on 08/10/2022. He was discharged from [facility] on 08/18/2022. Patient feels he continues to get stronger. He does have some numbness left pinky and ring finger;may be from positioning. Will hopefully resolve with more time. He has "fullness, congestion" in right ear. He is ambulating several times daily.  Facility-Administered Medications Ordered in Other Visits  Medication Dose Route Frequency Provider Last Rate Last Admin   acetaminophen (TYLENOL) tablet 325-650 mg  325-650 mg Oral Q4H PRN Bary Leriche, PA-C   650 mg at 08/10/22 2311   alum & mag hydroxide-simeth (MAALOX/MYLANTA) 200-200-20 MG/5ML suspension 30 mL  30 mL Oral Q4H PRN Love, Pamela S, PA-C       aspirin EC tablet 81 mg  81 mg Oral Daily Bary Leriche, PA-C   81 mg at 08/12/22 [id number]   bisacodyl (DULCOLAX) EC tablet 10 mg  10 mg Oral Daily PRN Reesa Chew S, PA-C       Or   bisacodyl (DULCOLAX) suppository 10 mg  10 mg Rectal Daily PRN Love, Pamela S, PA-C       bisacodyl (DULCOLAX) suppository 10 mg  10 mg Rectal Daily PRN Love, Pamela S, PA-C       carbidopa-levodopa (SINEMET IR) 25-100 MG per tablet immediate release 1 tablet  1 tablet Oral TID Love, Pamela S, PA-C   1 tablet at 08/12/22 [id number]   diphenhydrAMINE (BENADRYL) 12.5 MG/5ML elixir 12.5-25 mg  12.5-25 mg Oral Q6H PRN Love, Pamela S, PA-C       docusate sodium (COLACE) capsule 200 mg  200 mg Oral BID PRN Love, Pamela  S, PA-C       donepezil (ARICEPT) tablet 10 mg  10 mg Oral QHS Love, Pamela S, PA-C   10 mg at 08/11/22 2228   guaiFENesin-dextromethorphan (ROBITUSSIN DM) 100-10 MG/5ML syrup 5-10 mL  5-10 mL Oral Q6H PRN Love, Pamela S, PA-C       loratadine (CLARITIN) tablet 10 mg  10 mg Oral Daily Bary Leriche, PA-C   10 mg at 08/12/22 [id number]   melatonin tablet 3 mg  3 mg Oral QHS PRN Bary Leriche, PA-C   3 mg at 08/11/22 2229   methocarbamol (ROBAXIN) tablet 500 mg  500 mg Oral Q6H PRN Love, Pamela S, PA-C       metoprolol tartrate (LOPRESSOR) tablet 12.5 mg  12.5 mg Oral BID Love, Pamela S, PA-C   12.5 mg at 08/12/22 [id number]   Oral care mouth rinse  15 mL Mouth Rinse PRN Love, Pamela S, PA-C       oxyCODONE (Oxy IR/ROXICODONE) immediate release tablet 5-10 mg  5-10 mg Oral Q3H PRN Love, Pamela S, PA-C       pantoprazole (PROTONIX) EC tablet 40 mg  40 mg Oral Daily Bary Leriche, PA-C   40 mg at 08/12/22 [id number]   polyethylene glycol (  MIRALAX / GLYCOLAX) packet 17 g  17 g Oral Daily PRN Love, Pamela S, PA-C       polyvinyl alcohol (LIQUIFILM TEARS) 1.4 % ophthalmic solution 1 drop  1 drop Both Eyes PRN Love, Pamela S, PA-C       prochlorperazine (COMPAZINE) tablet 5-10 mg  5-10 mg Oral Q6H PRN Love, Pamela S, PA-C       Or   prochlorperazine (COMPAZINE) injection 5-10 mg  5-10 mg Intramuscular Q6H PRN Love, Pamela S, PA-C       Or   prochlorperazine (COMPAZINE) suppository 12.5 mg  12.5 mg Rectal Q6H PRN Love, Pamela S, PA-C       rivaroxaban (XARELTO) tablet 20 mg  20 mg Oral Q supper Love, Pamela S, PA-C   20 mg at 08/11/22 1741   rosuvastatin (CRESTOR) tablet 5 mg  5 mg Oral Daily Bary Leriche, PA-C   5 mg at 08/12/22 [id number]   sodium phosphate (FLEET) 7-19 GM/118ML enema 1 enema  1 enema Rectal Once PRN Love, Pamela S, PA-C       traZODone (DESYREL) tablet 25-50 mg  25-50 mg Oral QHS PRN Bary Leriche, PA-C      Vital Signs: Vitals:   08/19/22 1327  BP: 109/72  Pulse: 71  Resp: 20  SpO2: 94%      Physical Exam: CV-RRR Pulmonary-Clear to auscultation bilaterally Extremities-No LE edema Wounds-Sternal and RLE wounds are clean, dry, healing without signs of infection. RLE thigh with ecchymosis and small hematoma (no erythema or warmth).  Diagnostic Tests: Narrative & Impression  CLINICAL DATA:  Status post coronary bypass graft.   EXAM: CHEST - 2 VIEW   COMPARISON:  August 08, 2022.   FINDINGS: The heart size and mediastinal contours are within normal limits. Both lungs are clear. Sternotomy wires are noted. The visualized skeletal structures are unremarkable.   IMPRESSION: No active cardiopulmonary disease.     Electronically Signed   By: Marijo Conception M.D.   On: 08/19/2022 13:20    Impression and Plan: Overall, Mr. Menner is continuing to recover from coronary artery bypass grafting surgery. We discussed sternal precautions (I.e. no lifting more than 10 pounds) are to continue until 05/05. At next office visit, we will discuss driving and participation in cardiac rehab (wishes to go to rehab in [facility] or possibly home PT). He has an appointment to see cardiology on 03/25. He will return to see Dr. Tenny Craw in 2 weeks (no CXR needed).     Nani Skillern, PA-C [facility] [phone]

## 2022-08-12 NOTE — Progress Notes (Signed)
Patient ID: Terry [name], male   DOB: [birth year], 72 y.o.   MRN: [id number]  Team Conference Report to Patient/Family  Team Conference discussion was reviewed with the patient and caregiver, including goals, any changes in plan of care and target discharge date.  Patient and caregiver express understanding and are in agreement.  The patient has a target discharge date of 08/18/22.  Sw met with patient and called spouse at bedside. Patient spouse anticipates completing family education on Saturday 10a-12p. Patient and spouse requesting OP facility in [place]. No additional questions or concerns.   Dyanne Iha 08/12/2022, 1:17 PM

## 2022-08-12 NOTE — Progress Notes (Signed)
Physical Therapy Session Note  Patient Details  Name: Terry [name] MRN: [id number] Date of Birth: 1951-03-04  Today's Date: 08/12/2022 PT Individual Time: 1130-1155 PT Individual Time Calculation (min): 25 min   Short Term Goals: Week 1:  PT Short Term Goal 1 (Week 1): = to LTGs based on ELOS  Skilled Therapeutic Interventions/Progress Updates:      Pt sitting in recliner - agreeable to therapy session. Denies pain.   Sit<>stand with CGA and no AD - noted lack of ankle strategies bilaterally due to prior achilles tendon surgeries.  Ambulated to main rehab gym with CGA and no AD, ~2f - cues for increasing gait speed.  Administered 6MWT in rehab hallways - patient instructed in purpose and rules. He covered 8211fwithin 6 minutes, not AD used. Pt reporting 7/10 on RPE scale however patient holding a conversation throughout testing. Age/sex norm = 1,72956fscore suggestive of decreased aerobic capacity and endurance.  Completed Kinetron at L30 cm/sec resistance, encouraged increased pace to challenge and encouraged full ROM bilaterally to activiate posterior chain and core.   Returned to his room at patient assisted to bed with supervision - concluded session with HOB raised, alarm on, call bell in lap.    Therapy Documentation Precautions:  Precautions Precautions: Sternal, Fall Precaution Comments: verbal review for Move in the Tube precautions Restrictions Weight Bearing Restrictions: No RUE Weight Bearing:  (sternal precautions) LUE Weight Bearing:  (sternal precautions) General:    Therapy/Group: Individual Therapy  Neytiri Asche P Justina Bertini PT 08/12/2022, 7:49 AM

## 2022-08-12 NOTE — Progress Notes (Signed)
Patient ID: Terry [name], male   DOB: 09-12-1950, 72 y.o.   MRN: [id number]  OP referral sent to [facility].  [email or web address]

## 2022-08-13 DIAGNOSIS — R5381 Other malaise: Secondary | ICD-10-CM | POA: Diagnosis not present

## 2022-08-13 MED ORDER — OXYMETAZOLINE HCL 0.05 % NA SOLN
1.0000 | Freq: Two times a day (BID) | NASAL | Status: AC
  Administered 2022-08-13 – 2022-08-15 (×6): 1 via NASAL
  Filled 2022-08-13: qty 30

## 2022-08-13 NOTE — Evaluation (Addendum)
Speech Language Pathology Assessment and Plan  Patient Details  Name: Terry [name] MRN: [id number] Date of Birth: 1951/04/12  SLP Diagnosis: Cognitive Impairments  Rehab Potential: Fair ELOS: 5-7 days    Today's Date: 08/13/2022 SLP Individual Time: 0730-0830 SLP Individual Time Calculation (min): 60 min   Hospital Problem: Principal Problem:   Debility  Past Medical History:  Past Medical History:  Diagnosis Date   Arthritis    neck, back, knees, right ankle   Bladder calculus    Chronic back pain    Coronary artery disease    GERD (gastroesophageal reflux disease)    History of colon polyps    benign   History of kidney stones    and bladder stone   History of prostate cancer urologist-- dr Alinda Money   dx 2014---  s/p  prostatectomy 05-22-2013 ,  Gleason 3+3   Hypercholesterolemia    takes Crestor daily   Hyperlipidemia    Pneumonia 10/2019   Pulmonary emboli ([place]) 10/2019   Wears glasses    Past Surgical History:  Past Surgical History:  Procedure Laterality Date   ACHILLES TENDON SURGERY Right 06-07-2018   '@[facility]'$    reconstruction   ANTERIOR CERVICAL DECOMP/DISCECTOMY FUSION N/A 06/27/2014   Procedure: ANTERIOR CERVICAL DECOMPRESSION/DISCECTOMY FUSION C5-C7   (2 LEVELS);  Surgeon: Melina Schools, MD;  Location: [facility];  Service: Orthopedics;  Laterality: N/A;   back fusion  03/2022   CARDIAC CATHETERIZATION  2004   CLIPPING OF ATRIAL APPENDAGE Left 08/04/2022   Procedure: CLIPPING OF ATRIAL APPENDAGE USING AN ATRICURE 50 ATRICLIP;  Surgeon: Neomia Glass, MD;  Location: [facility];  Service: Open Heart Surgery;  Laterality: Left;   COLONOSCOPY     CORONARY ARTERY BYPASS GRAFT N/A 08/04/2022   Procedure: CORONARY ARTERY BYPASS GRAFTING (CABG) X THREE USING LEFT INTERNAL MAMMARY ARTERY (LIMA) AND ENDOSCOPICALLY HARVESTED RIGHT GREATER SAPHENOUS VEIN.;  Surgeon: Neomia Glass, MD;  Location: [facility];  Service: Open Heart Surgery;  Laterality: N/A;   CYSTOSCOPY WITH  LITHOLAPAXY N/A 03/27/2019   Procedure: CYSTOSCOPY WITH REMOVAL OF BLADDER STONE AND FOREIGN BODY;  Surgeon: Raynelle Bring, MD;  Location: [facility];  Service: Urology;  Laterality: N/A;   EYE SURGERY     lazy eye as child, lasik surgery   FINGER SURGERY Right    4th finger   I & D EXTREMITY Right 07/10/2018   Procedure: IRRIGATION AND DEBRIDEMENT RIGHT ANKLE WOUND AND WOUND VAC PLACEMENT;  Surgeon: Wylene Simmer, MD;  Location: [facility];  Service: Orthopedics;  Laterality: Right;   KNEE ARTHROSCOPY Bilateral right ?;  left 05-25-2007  '@[facility]'$    LEFT HEART CATH AND CORONARY ANGIOGRAPHY N/A 07/20/2022   Procedure: LEFT HEART CATH AND CORONARY ANGIOGRAPHY;  Surgeon: Lorretta Harp, MD;  Location: [facility];  Service: Cardiovascular;  Laterality: N/A;   POSTERIOR LAMINECTOMY / DECOMPRESSION LUMBAR SPINE  08-31-2016    dr elsner  '@[facility]'$    PROSTATE BIOPSY  04/13/2013   gleason 3+3=6, vol 55.4 cc   ROBOT ASSISTED LAPAROSCOPIC RADICAL PROSTATECTOMY N/A 05/22/2013   Procedure: ROBOTIC ASSISTED LAPAROSCOPIC RADICAL PROSTATECTOMY LEVEL 1;  Surgeon: Dutch Gray, MD;  Location: [facility];  Service: Urology;  Laterality: N/A;   TEE WITHOUT CARDIOVERSION N/A 08/04/2022   Procedure: TRANSESOPHAGEAL ECHOCARDIOGRAM;  Surgeon: Neomia Glass, MD;  Location: [place];  Service: Open Heart Surgery;  Laterality: N/A;   TONSILLECTOMY      Assessment / Plan / Recommendation Clinical Impression CC: Functional deficits secondary  to CAD s/p CABG   HPI: Terry [name] is a 72 year old male who was evaluated by Dr. Gwenlyn Found on 07/17/2022 and had obtained a coronary CTA on 07/15/2022 revealing a coronary calcium score of 440 with significant disease in his RCA by FFR analysis.  Based on this, it was decided to proceed with outpatient radial diagnostic coronary angiography.  He was asked to hold his Xarelto 2 days before the procedure.  He was evaluated by Dr. Tenny Craw and scheduled for coronary artery bypass grafting.   On 08/04/2022, he underwent CABG x 3 vessels, clipping of atrial appendage and vein harvest from right lower extremity.  Transferred to cardiac ICU with labile blood pressure.  Extubated on 3/06.  Mild delirium 3/08.  Foley discontinued.  Multiple loose stools overnight and bowel regimen decreased.  Started on aspirin 81 mg daily.  Lovenox for DVT prophylaxis.  Tolerating diet.  Xarelto resumed 3/10. Kidney function and hemoglobin remained stable.  Requiring no oxygen supplementation.  Ambulated yesterday 450 feet with front wheel rolling walker.The patient requires inpatient medicine and rehabilitation evaluations and services for ongoing dysfunction secondary to coronary artery disease status post CABG.  SLP consulted to complete cognitive-linguistic evaluation in the setting of debility secondary to CAD s/p CABG. Pt greeted awake/alert and sitting EOB. Agreeable to evaluation. Per chart review, pt with hx of Parkinson's Disease, which was diagnosed on 2023 by Dr. Krista Blue - scored a 18/30 on [place] in 2023.  Per informal and formal assessment measures, pt presents with at least mild cognitive impairment in the domains of recall, attention, executive functioning, and visuospatial skills as evident by pt achieving a score of 19/30 points on the [place] SLUMS this date (n = greater than or equal to 27 out of 30 points for HS education). Receptive and expressive language noted to be grossly intact for tasks assessed, though disorganized and tangential speech noted at times. Also, appeared slightly in denial of current cognitive status, with limited awareness of errors in some instances. Re: speech, vocal quality was noted to be low in intensity with slightly rapid rate; nevertheless, intelligible at the conversational level - reports this to be consistent with baseline. Prior to admission, pt completed some iADL tasks independently to include medication management driving, cooking, and cleaning. Pt endorses a "slight"  change in cognitive function following CABG, though does appear to be improving. Required Mod A for medication management task of organizing 2 of his current medications in TID pill organizer - states he managed medication with minimal to no assistance prior to admission. Suspect pt is approaching cognitive baseline given MOCA and [facility] SLUMS score comparison.  Given clinical presentation pt reports, recommend short-course of ST intervention to address aforementioned deficits in order to maximize pt's independence and decrease caregiver burden. Results and recommendations were reviewed with pt who verbalized understanding and agreement. Anticipate need for 24/7 supervision and assistance, as well as f/u ST intervention at next venue of care post-discharge.   Skilled Therapeutic Interventions          [facility] SLUMS and informal assessment measures administered. Please see full report for details.  SLP Assessment  Patient will need skilled [place] [facility] Services during CIR admission    Recommendations  Recommendations for Other Services: Neuropsych consult Patient destination: Home Follow up Recommendations: Outpatient SLP;24 hour supervision/assistance Equipment Recommended: None recommended by SLP    SLP Frequency 1 to 3 out of 7 days   SLP Duration  SLP Intensity  SLP Treatment/Interventions 5-7 days  Minumum  of 1-2 x/day, 30 to 90 minutes  Cognitive remediation/compensation;Functional tasks;Medication managment;Patient/family education    Pain Pain Assessment Pain Scale: 0-10 Pain Score: 0-No pain  Prior Functioning Cognitive/Linguistic Baseline: Baseline deficits Baseline deficit details: Reports neurocognitive deficits prior to admission - states he began Aricept, due to this, at the recommendation of his neurologist. Dx with Parkinson's Disease in 2023 - followed by neurology Type of Home: Other(Comment) (Condo)  Lives With: Spouse Available Help at Discharge:  Family;Available 24 hours/day Education: college Vocation: Retired  Programmer, systems Overall Cognitive Status: Impaired/Different from baseline Arousal/Alertness: Awake/alert Orientation Level: Oriented X4 Year: 2024 Month: March Day of Week: Correct Attention: Focused;Sustained Focused Attention: Appears intact Sustained Attention: Appears intact Memory: Impaired Memory Impairment: Retrieval deficit;Decreased recall of new information;Decreased short term memory;Storage deficit Decreased Short Term Memory: Functional basic;Verbal complex Awareness: Impaired Awareness Impairment: Anticipatory impairment;Emergent impairment Problem Solving: Impaired Problem Solving Impairment: Verbal complex;Functional basic Executive Function: Self Monitoring;Organizing Organizing: Impaired Organizing Impairment: Verbal basic;Functional basic Self Monitoring: Impaired Self Monitoring Impairment: Functional basic;Verbal complex Behaviors: Impulsive (slight with movement) Safety/Judgment: Impaired  Comprehension Auditory Comprehension Overall Auditory Comprehension: Appears within functional limits for tasks assessed Visual Recognition/Discrimination Discrimination: Not tested Reading Comprehension Reading Status: Not tested Expression Expression Primary Mode of Expression: Verbal Verbal Expression Overall Verbal Expression: Appears within functional limits for tasks assessed Written Expression Dominant Hand: Right Written Expression: Not tested Oral Motor Oral Motor/Sensory Function Overall Oral Motor/Sensory Function: Within functional limits Motor Speech Overall Motor Speech: Appears within functional limits for tasks assessed Respiration: Within functional limits Phonation: Low vocal intensity;Normal Resonance: Within functional limits Articulation: Within functional limitis Intelligibility: Intelligible Motor Planning: Witnin functional limits Motor Speech Errors: Not  applicable  Care Tool Care Tool Cognition Ability to hear (with hearing aid or hearing appliances if normally used Ability to hear (with hearing aid or hearing appliances if normally used): 0. Adequate - no difficulty in normal conservation, social interaction, listening to TV   Expression of Ideas and Wants Expression of Ideas and Wants: 3. Some difficulty - exhibits some difficulty with expressing needs and ideas (e.g, some words or finishing thoughts) or speech is not clear   Understanding Verbal and Non-Verbal Content Understanding Verbal and Non-Verbal Content: 3. Usually understands - understands most conversations, but misses some part/intent of message. Requires cues at times to understand  Memory/Recall Ability Memory/Recall Ability : Current season;That he or she is in a hospital/hospital unit;Staff names and faces    Bedside Swallowing Assessment Not completed - MD only ordered cog evaluation - on regular diet and thin liquids per chart review   Short Term Goals: Week 1: SLP Short Term Goal 1 (Week 1): STG's = LTG's due to ELOS  Refer to Care Plan for Long Term Goals  Recommendations for other services: Neuropsych  Discharge Criteria: Patient will be discharged from SLP if patient refuses treatment 3 consecutive times without medical reason, if treatment goals not met, if there is a change in medical status, if patient makes no progress towards goals or if patient is discharged from hospital.  The above assessment, treatment plan, treatment alternatives and goals were discussed and mutually agreed upon: by patient  Romelle Starcher A Vallen Calabrese 08/13/2022, 12:53 PM

## 2022-08-13 NOTE — Progress Notes (Signed)
PROGRESS NOTE   Subjective/Complaints: Feels ok today , aware of d/c date  ROS- neg CP, SOB, N/V/D  Objective:   No results found. Recent Labs    08/11/22 0629  WBC 9.4  HGB 11.1*  HCT 32.7*  PLT 327    Recent Labs    08/11/22 0629  NA 139  K 3.9  CL 102  CO2 24  GLUCOSE 105*  BUN 13  CREATININE 0.94  CALCIUM 9.2     Intake/Output Summary (Last 24 hours) at 08/13/2022 1245 Last data filed at 08/12/2022 1800 Gross per 24 hour  Intake 589 ml  Output --  Net 589 ml         Physical Exam: Vital Signs Blood pressure (!) 108/58, pulse 78, temperature 98.8 F (37.1 C), resp. rate 18, height '5\' 8"'$  (1.727 m), weight 71.9 kg, SpO2 97 %.   General: No acute distress Mood and affect are appropriate Heart: Regular rate and rhythm no rubs murmurs or extra sounds Lungs: Clear to auscultation, breathing unlabored, no rales or wheezes Abdomen: Positive bowel sounds, soft nontender to palpation, nondistended Extremities: No clubbing, cyanosis, or edema Skin: No evidence of breakdown, no evidence of rash sternotomy incision CDI with dermabond, RIght saphenous vein harvest side with ecchymosis, incision without drainage  Neurologic: Cranial nerves II through XII intact, motor strength is 5/5 in bilateral deltoid, bicep, tricep, grip, hip flexor, knee extensors, ankle dorsiflexor and plantar flexor Oriented to person place time,pt unable to remember name of cardiac surgeon, remembers neurosurgeon name  Musculoskeletal: Full range of motion in all 4 extremities. No joint swelling    Assessment/Plan: 1. Functional deficits which require 3+ hours per day of interdisciplinary therapy in a comprehensive inpatient rehab setting. Physiatrist is providing close team supervision and 24 hour management of active medical problems listed below. Physiatrist and rehab team continue to assess barriers to discharge/monitor patient  progress toward functional and medical goals  Care Tool:  Bathing    Body parts bathed by patient: Right arm, Left arm, Chest, Abdomen, Front perineal area, Buttocks, Right upper leg, Left upper leg, Right lower leg, Left lower leg, Face   Body parts bathed by helper: Buttocks     Bathing assist Assist Level: Minimal Assistance - Patient > 75%     Upper Body Dressing/Undressing Upper body dressing   What is the patient wearing?: Button up shirt (zipper jacket)    Upper body assist Assist Level: Minimal Assistance - Patient > 75%    Lower Body Dressing/Undressing Lower body dressing      What is the patient wearing?: Underwear/pull up, Pants     Lower body assist Assist for lower body dressing: Contact Guard/Touching assist     Toileting Toileting    Toileting assist Assist for toileting: Contact Guard/Touching assist     Transfers Chair/bed transfer  Transfers assist     Chair/bed transfer assist level: Contact Guard/Touching assist     Locomotion Ambulation   Ambulation assist      Assist level: Minimal Assistance - Patient > 75% Assistive device: No Device Max distance: 129f   Walk 10 feet activity   Assist     Assist level:  Contact Guard/Touching assist Assistive device: No Device   Walk 50 feet activity   Assist    Assist level: Minimal Assistance - Patient > 75% Assistive device: No Device    Walk 150 feet activity   Assist    Assist level: Minimal Assistance - Patient > 75% Assistive device: No Device    Walk 10 feet on uneven surface  activity   Assist     Assist level: Contact Guard/Touching assist Assistive device: Other (comment) (railing)   Wheelchair     Assist Is the patient using a wheelchair?: Yes Type of Wheelchair: Manual    Wheelchair assist level: Dependent - Patient 0%      Wheelchair 50 feet with 2 turns activity    Assist        Assist Level: Dependent - Patient 0%    Wheelchair 150 feet activity     Assist      Assist Level: Dependent - Patient 0%   Blood pressure (!) 108/58, pulse 78, temperature 98.8 F (37.1 C), resp. rate 18, height '5\' 8"'$  (1.727 m), weight 71.9 kg, SpO2 97 %.  Medical Problem List and Plan: 1. Functional deficits secondary to debility             -patient may  shower             -ELOS/Goals: 7-10d ModI/Sup goals Team conference today please see physician documentation under team conference tab, met with team  to discuss problems,progress, and goals. Formulized individual treatment plan based on medical history, underlying problem and comorbidities.    2.  Antithrombotics: -DVT/anticoagulation:  Pharmaceutical: Xarelto             -antiplatelet therapy: aspirin 81 mg daily   3. Pain Management: Tylenol as needed   4. Mood/Behavior/Sleep: LCSW to evaluate and provide emotional support             -antipsychotic agents: n/a   5. Neuropsych/cognition: This patient is capable of making decisions on his own behalf with assistance for complex decisions.   6. Skin/Wound Care: Routine skin care checks   7. Fluids/Electrolytes/Nutrition: Routine Is and Os and follow-up chemistries   8: Hypertension: monitor TID and prn; on Lopressor 12.5 mg BID Vitals:   08/12/22 2009 08/13/22 0532  BP: 104/66 (!) 108/58  Pulse: 71 78  Resp: 16 18  Temp: 98 F (36.7 C) 98.8 F (37.1 C)  SpO2: 100% 97%      9: Hyperlipidemia: continue statin   10: CAD s/p CABG on 08/04/22             -sternal precautions x 6 wks             -continue Lopressor 12.5 mg BID   11: History of PE on Xarelto   12: Multiple prior spine surgeries.  Dr. Ellene Route (lumbar), hx L3-5 fusion with extension to L2-3 fusion in Oct 2023- no back pain or residual leg pain   13: History of Parkinson's disease: Continue Sinemet and Aricept    LOS: 3 days A FACE TO FACE EVALUATION WAS PERFORMED  Charlett Blake 08/13/2022, 12:45 PM

## 2022-08-13 NOTE — Plan of Care (Signed)
  Problem: RH Problem Solving Goal: LTG Patient will demonstrate problem solving for (SLP) Description: LTG:  Patient will demonstrate problem solving for basic/complex daily situations with cues  (SLP) Flowsheets (Taken 08/13/2022 2209) LTG: Patient will demonstrate problem solving for (SLP): Basic daily situations LTG Patient will demonstrate problem solving for:  Minimal Assistance - Patient > 75%  Moderate Assistance - Patient 50 - 74%   Problem: RH Awareness Goal: LTG: Patient will demonstrate awareness during functional activites type of (SLP) Description: LTG: Patient will demonstrate awareness during functional activites type of (SLP) Flowsheets (Taken 08/13/2022 2209) Patient will demonstrate during cognitive/linguistic activities awareness type of: Emergent LTG: Patient will demonstrate awareness during cognitive/linguistic activities with assistance of (SLP): Moderate Assistance - Patient 50 - 74%

## 2022-08-13 NOTE — Patient Care Conference (Signed)
Inpatient RehabilitationTeam Conference and Plan of Care Update Date: 08/12/2022   Time: 10:48 AM    Patient Name: Terry [name]      Medical Record Number: [id number]  Date of Birth: 06/11/1950 Sex: Male         Room/Bed: 4W16C/4W16C-01 Payor Info: Payor: MEDICARE / Plan: MEDICARE PART A AND B / Product Type: *No Product type* /    Admit Date/Time:  08/10/2022  9:29 PM  Primary Diagnosis:  [facility] Problems: Principal Problem:   Debility    Expected Discharge Date: Expected Discharge Date: 08/18/22  Team Members Present: Physician leading conference: Dr. Alysia Penna Social Worker Present: Erlene Quan, BSW Nurse Present: Dorien Chihuahua, RN PT Present: Page Spiro, PT OT Present: Other (comment) Lowella Fairy, OT) SLP Present: Other (comment) PPS Coordinator present : Gunnar Fusi, SLP     Current Status/Progress Goal Weekly Team Focus  Bowel/Bladder   continent of b/b   Remain continent   Assist qshift and prn    Swallow/Nutrition/ Hydration               ADL's   Min A UB dressing, CGA LB dressing, min A U/LB bathing, CGA toileting, CGA grooming/hygiene tasks, CGA-light min A toilet/shower transfer ambualting with RW- limited by decreased safety awareness, sternal precuations, decreased endurance, and mild anxiety d/t sternal pain/tightness   supervision   BADL retrianing, functional mobility training, family education, energy conservation, IADL retraining, functional cognition, fucntional transfer trianing    Mobility   min assist bed mobility, CGA sit<>stand & stand pivot w/o AD, gait up to 181f no AD CGA, CGA/min assist 12 stairs using  BHRs - impaired endurance   supervision overall at ambulatory level  activity tolerance, transfer training, dynamic gait training, dynamic standing balance, cardiopulmonary endurance training, stair navigation, pt/family education, D/C planning    Communication                Safety/Cognition/  Behavioral Observations               Pain   no c/o pain   Remain pain free   Assess qshift and prn    Skin   mid line incision, ota. incision on inner right thigh, ota. generalized bruising   promote skin integrity  Assess qshift and prn      Discharge Planning:  Discharging home with spouseand older sister traveling here to assist. Spouse works PT and requesting d/c any day but on a Wednesday.   Team Discussion: Patient with chronic mild cognitive deficits post CABG with sternal precautions.  Noted problem solving deficits and motor planning issues; patient is hesitant to move without re-enforcement of approval from staff.  Patient on target to meet rehab goals: yes, currently needs min assist for upper body care and CGA assist for lower body.  Needs min assist for bathing  and CGA - supervision for toileting, shower transfers. Completes sit to stand and stand pivot without an assistive device and ambulate up to 150' without an assistive device with CGA. Goals for discharge set for supervision overall.  *See Care Plan and progress notes for long and short-term goals.   Revisions to Treatment Plan:  SLP eval    Teaching Needs: Safety, skin care, medications, transfers, toileting, etc.   Current Barriers to Discharge: Decreased caregiver support, Home enviroment access/layout, and Weight bearing restrictions  Possible Resolutions to Barriers: Family education OP follow up services DME: TBD     Medical Summary Current Status: 8d Post  op CABG, hx L2-5 fusion with last surgery ~69moago  Barriers to Discharge: Medical stability;Weight bearing restrictions   Possible Resolutions to [employer] work on balance, break down of tasks to improve follow through, caregiver education, sternal precautions   Continued Need for Acute Rehabilitation Level of Care: The patient requires daily medical management by a physician with specialized training in physical medicine  and rehabilitation for the following reasons: Direction of a multidisciplinary physical rehabilitation program to maximize functional independence : Yes Medical management of patient stability for increased activity during participation in an intensive rehabilitation regime.: Yes Analysis of laboratory values and/or radiology reports with any subsequent need for medication adjustment and/or medical intervention. : Yes   I attest that I was present, lead the team conference, and concur with the assessment and plan of the team.   SDorien ChihuahuaB 08/13/2022, 7:39 AM

## 2022-08-13 NOTE — Progress Notes (Signed)
Physical Therapy Session Note  Patient Details  Name: Terry [name] MRN: [id number] Date of Birth: 1951/01/01  Today's Date: 08/13/2022 PT Individual Time: 1300-1400 PT Individual Time Calculation (min): 60 min   Short Term Goals: Week 1:  PT Short Term Goal 1 (Week 1): = to LTGs based on ELOS  Skilled Therapeutic Interventions/Progress Updates:      Therapy Documentation Precautions:  Precautions Precautions: Sternal, Fall Precaution Comments: verbal review for Move in the Tube precautions Restrictions Weight Bearing Restrictions: No RUE Weight Bearing:  (sternal precautions) LUE Weight Bearing:  (sternal precautions)  Pt received seated in w/c at bedside, agreeable to PT session with emphasis on dynamic balance training and AD selection. Pt with unrated chest pain with coughing and utilized heart pillow throughout for relief. PT obtained rollator and instructed pt on how to safely lock and utilize with mobility. Pt reports he owns a rollator at home. L  Pt requires close supervision for sit to stand and gait >300 ft throughout session with rollator with min verbal cues for AD management.   Pt reports his goal is to be able to navigate steps at church with a forward approach instead of side step strategy, therefore PT session with emphasis on single leg balance training and step negotiation.   Pt performed alternating 4 inch step taps 2 x 10 bilaterally and progressed to 4 inch step up's 1 x 10 bilaterally and requires CGA/min A for balance as pt with tendency for posterior loss of balance.   Pt ambulated with rollator with to main gym and negotiated 16 steps with forward step technique and CGA without handrails with verbal cues for pacing and for patient to take his time for safety.   Pt performed alternating cone taps with 3 # ankle weights 2 x 10 with CGA/min A.    Pt ambulated to room and left seated in w/c at bedside with all needs in reach and chair alarm on.       Therapy/Group: Individual Therapy  Verl Dicker Verl Dicker PT, DPT  08/13/2022, 7:59 AM

## 2022-08-13 NOTE — Progress Notes (Signed)
Occupational Therapy Session Note  Patient Details  Name: Terry [name] MRN: [id number] Date of Birth: October 30, 1950  Today's Date: 08/13/2022 OT Individual Time:  0950-1102    OT Individual Time Calculation:72 min  Short Term Goals: Week 1:  OT Short Term Goal 1 (Week 1): STG=LTG d/t Pt ELOS  Skilled Therapeutic Interventions/Progress Updates:     Pt received sitting up in bed with RN present in room. Pt presenting to be in good spirits and receptive to skilled OT session. Pt reporting 0/10 pain and stating he feels more confident in his mobility today vs. previous day. Pt requesting to take shower during session this AM. Pt doffed shirt seated EOB and pants standing with RW close supervision. Pt sit>stand using RW close supervision with cueing required for hand placement. Ambulated to bathroom using RW and transferred to walk in shower using grab bars close supervision. Pt requesting to standing during shower today with OT providing close supervision to ensure safety. Increased endurance challenge completing shower in standing with Pt able to complete without LOB or SOB. Pt instructed to sit when completing LB bathing to increase safety with Pt able to complete close supervision without breaking sternal precautions. Pt ambulated to room using RW and completed dressing tasks seated EOB for safety and energy conservation. Pt required min A to orient zip up jacket and VB cueing to maintain precautions while donning. Donned pants, socks, and shoes seated EOB and standing to bring pants to waist close supervision. Pt completed grooming/hygiene tasks standing at sink for dynamic standing balance challenge with close supervision. Pt ambulated within room to to complete light simulated laundry tasks putting away laundry in drawers and closet. Pt able to complete tasks without LOB or SOB with min cueing required to maintain sternal precautions. Pt completed functional mobility to ADL apartment using RW for  endurance training ~175 ft without SOB noted. Discussed bed mobility with Pt and educated on log rolling technique to maintain precautions and prevent onset of pain. Pt able to transition EOB <> supine with min cueing required using learned technique. Pt ambulated back to room using RW close supervision and was left resting in wc with call bell in reach, seat belt alarm on, and all needs met.   Therapy Documentation Precautions:  Precautions Precautions: Sternal, Fall Precaution Comments: verbal review for Move in the Tube precautions Restrictions Weight Bearing Restrictions: No RUE Weight Bearing:  (sternal precautions) LUE Weight Bearing:  (sternal precautions) General:   Vital Signs: Therapy Vitals Temp: 98.8 F (37.1 C) Pulse Rate: 78 Resp: 18 BP: (Abnormal) 108/58 Patient Position (if appropriate): Lying Oxygen Therapy SpO2: 97 % O2 Device: Room Air  Therapy/Group: Individual Therapy  Janey Genta 08/13/2022, 8:00 AM

## 2022-08-14 ENCOUNTER — Telehealth (HOSPITAL_COMMUNITY): Payer: Self-pay

## 2022-08-14 NOTE — Progress Notes (Signed)
PROGRESS NOTE   Subjective/Complaints: Feels ok today , aware of d/c date  ROS- neg CP, SOB, N/V/D  Objective:   No results found. No results for input(s): "WBC", "HGB", "HCT", "PLT" in the last 72 hours.  No results for input(s): "NA", "K", "CL", "CO2", "GLUCOSE", "BUN", "CREATININE", "CALCIUM" in the last 72 hours.   Intake/Output Summary (Last 24 hours) at 08/14/2022 0818 Last data filed at 08/14/2022 [id number] Gross per 24 hour  Intake 476 ml  Output --  Net 476 ml         Physical Exam: Vital Signs Blood pressure 111/73, pulse 75, temperature 98 F (36.7 C), resp. rate 17, height 5\' 8"  (1.727 m), weight 68.9 kg, SpO2 97 %.   General: No acute distress Mood and affect are appropriate Heart: Regular rate and rhythm no rubs murmurs or extra sounds Lungs: Clear to auscultation, breathing unlabored, no rales or wheezes Abdomen: Positive bowel sounds, soft nontender to palpation, nondistended Extremities: No clubbing, cyanosis, or edema Skin: No evidence of breakdown, no evidence of rash sternotomy incision CDI with dermabond, RIght saphenous vein harvest side with ecchymosis, incision without drainage  Neurologic: Cranial nerves II through XII intact, motor strength is 5/5 in bilateral deltoid, bicep, tricep, grip, hip flexor, knee extensors, ankle dorsiflexor and plantar flexor Oriented to person place time,pt unable to remember name of cardiac surgeon, remembers neurosurgeon name  Musculoskeletal: Full range of motion in all 4 extremities. No joint swelling    Assessment/Plan: 1. Functional deficits which require 3+ hours per day of interdisciplinary therapy in a comprehensive inpatient rehab setting. Physiatrist is providing close team supervision and 24 hour management of active medical problems listed below. Physiatrist and rehab team continue to assess barriers to discharge/monitor patient progress toward  functional and medical goals  Care Tool:  Bathing    Body parts bathed by patient: Right arm, Left arm, Chest, Abdomen, Front perineal area, Buttocks, Right upper leg, Left upper leg, Right lower leg, Left lower leg, Face   Body parts bathed by helper: Buttocks     Bathing assist Assist Level: Supervision/Verbal cueing     Upper Body Dressing/Undressing Upper body dressing   What is the patient wearing?: Button up shirt    Upper body assist Assist Level: Supervision/Verbal cueing    Lower Body Dressing/Undressing Lower body dressing      What is the patient wearing?: Pants     Lower body assist Assist for lower body dressing: Supervision/Verbal cueing     Toileting Toileting    Toileting assist Assist for toileting: Supervision/Verbal cueing     Transfers Chair/bed transfer  Transfers assist     Chair/bed transfer assist level: Contact Guard/Touching assist     Locomotion Ambulation   Ambulation assist      Assist level: Minimal Assistance - Patient > 75% Assistive device: No Device Max distance: 136ft   Walk 10 feet activity   Assist     Assist level: Contact Guard/Touching assist Assistive device: No Device   Walk 50 feet activity   Assist    Assist level: Minimal Assistance - Patient > 75% Assistive device: No Device    Walk 150 feet activity  Assist    Assist level: Minimal Assistance - Patient > 75% Assistive device: No Device    Walk 10 feet on uneven surface  activity   Assist     Assist level: Contact Guard/Touching assist Assistive device: Other (comment) (railing)   Wheelchair     Assist Is the patient using a wheelchair?: Yes Type of Wheelchair: Manual    Wheelchair assist level: Dependent - Patient 0%      Wheelchair 50 feet with 2 turns activity    Assist        Assist Level: Dependent - Patient 0%   Wheelchair 150 feet activity     Assist      Assist Level: Dependent - Patient  0%   Blood pressure 111/73, pulse 75, temperature 98 F (36.7 C), resp. rate 17, height 5\' 8"  (1.727 m), weight 68.9 kg, SpO2 97 %.  Medical Problem List and Plan: 1. Functional deficits secondary to debility             -patient may  shower             -ELOS/Goals: 3/19 ModI/Sup goals     2.  Antithrombotics: -DVT/anticoagulation:  Pharmaceutical: Xarelto             -antiplatelet therapy: aspirin 81 mg daily   3. Pain Management: Tylenol as needed   4. Mood/Behavior/Sleep: LCSW to evaluate and provide emotional support             -antipsychotic agents: n/a   5. Neuropsych/cognition: This patient is capable of making decisions on his own behalf with assistance for complex decisions.   6. Skin/Wound Care: Routine skin care checks   7. Fluids/Electrolytes/Nutrition: Routine Is and Os and follow-up chemistries   8: Hypertension: monitor TID and prn; on Lopressor 12.5 mg BID Vitals:   08/13/22 1926 08/14/22 0604  BP: 124/78 111/73  Pulse: 80 75  Resp: 16 17  Temp: 97.6 F (36.4 C) 98 F (36.7 C)  SpO2: 98% 97%      9: Hyperlipidemia: continue statin   10: CAD s/p CABG on 08/04/22             -sternal precautions x 6 wks             -continue Lopressor 12.5 mg BID   11: History of PE on Xarelto   12: Multiple prior spine surgeries.  Dr. Ellene Route (lumbar), hx L3-5 fusion with extension to L2-3 fusion in Oct 2023- no back pain or residual leg pain   13: History of Parkinson's disease: Continue Sinemet and Aricept   14.  Pt c/o constipation , had stool yesterday not receiving prn laxative , discussed increasing fruit and vegetable intake  LOS: 4 days A FACE TO FACE EVALUATION WAS PERFORMED  Charlett Blake 08/14/2022, 8:18 AM

## 2022-08-14 NOTE — Progress Notes (Signed)
Occupational Therapy Session Note  Patient Details  Name: Terry [name] MRN: [id number] Date of Birth: [birth year]  Today's Date: 08/14/2022 OT Individual Time: [id number] OT Individual Time Calculation (min): 88 min    Short Term Goals: Week 1:  OT Short Term Goal 1 (Week 1): STG=LTG d/t Pt ELOS  Skilled Therapeutic Interventions/Progress Updates:    Pt received supine with no c/o pain, agreeable to OT session. Pt completed bed mobility with mod I. He elected to wait until end of session to shower however wanted to fully change clothes. He sat EOB and dressed, requiring extra time and cueing for remaining seated to thread pants/underwear. Extensive discussion re AE use and community accessibility. He completed 100 ft of functional mobility to the therapy gym with no AD with close CGA. He completed a circuit of dynamic balance training with focus on challenging more narrow BOS, lateral stepping, ankle righting reactions and trunk control. Completed to challenge functional endurance and dynamic balance to reduce fall risk. He required intermittent rest breaks seated and CGA overall. He completed blocked practice sit <> stand holding a 4 lb ball at his chest level to challenge independence with ADL transfers. He completed single leg stance practice for carryover to threshold management at home- he required min A overall. Throughout session slower processing evident and often having difficulty following visual directions the first time. He ended with 500+ ft of functional mobility with one seated rest break at close (S) level. He was left sitting up with all needs met.   Therapy Documentation Precautions:  Precautions Precautions: Sternal, Fall Precaution Comments: verbal review for Move in the Tube precautions Restrictions Weight Bearing Restrictions: No RUE Weight Bearing:  (sternal precautions) LUE Weight Bearing:  (sternal precautions)  Therapy/Group: Individual Therapy  Curtis Sites 08/14/2022, 6:49 AM

## 2022-08-14 NOTE — Progress Notes (Signed)
Speech Language Pathology Daily Session Note  Patient Details  Name: TOMMASO KER MRN: [id number] Date of Birth: 1951-04-10  Today's Date: 08/14/2022 SLP Individual Time: 0915-1013 SLP Individual Time Calculation (min): 58 min  Short Term Goals: Week 1: SLP Short Term Goal 1 (Week 1): STG's = LTG's due to ELOS  Skilled Therapeutic Interventions: Skilled treatment session focused on cognitive goals. Upon arrival, patient was awake in bed and agreeable to treatment session. Patient independently recalled sternal precautions and sat EOB Mod I. Patient recalled SLP evaluation but demonstrated decreased awareness regarding current cognitive deficits. SLP facilitated session by providing supervision level verbal cues for recall of his current medications and their functions. Patient organized a TIB pill box (with encouragement) with Min verbal cues. Patient performed task in a mildly distracting environment independently. Patient reports mild memory deficits at baseline that he reports appear improved since initiation of medications. SLP provided education regarding memory compensatory strategies, especially in regard to medication management. Patient verbalized understanding but will need reinforcement. Patient left upright in bed with alarm on and all needs within reach. Continue with current plan of care.      Pain Pain Assessment Pain Scale: 0-10 Pain Score: 0-No pain  Therapy/Group: Individual Therapy  Janika Jedlicka 08/14/2022, 12:41 PM

## 2022-08-14 NOTE — Telephone Encounter (Signed)
Referral faxed to [facility]. 

## 2022-08-14 NOTE — Discharge Instructions (Signed)
Inpatient Rehab Discharge Instructions  Terry [name] Discharge date and time: 08/18/2022   Activities/Precautions/ Functional Status: Activity: no lifting, driving, or strenuous exercise until cleared by MD Diet: cardiac diet Wound Care: keep wound clean and dry Functional status:  ___ No restrictions     ___ Walk up steps independently ___ 24/7 supervision/assistance   ___ Walk up steps with assistance __x_ Intermittent supervision/assistance  ___ Bathe/dress independently ___ Walk with walker     ___ Bathe/dress with assistance ___ Walk Independently    ___ Shower independently ___ Walk with assistance    __x_ Shower with assistance _x__ No alcohol     ___ Return to work/school ________  Special Instructions: No driving, alcohol consumption or tobacco use. Sternal precautions for a total of six weeks post-op.   COMMUNITY REFERRALS UPON DISCHARGE:    Outpatient: PT                 Agency:[facility] Phone: [phone]               Appointment Date/Time:Please allow 3-5 days for scheduling to reach.   My questions have been answered and I understand these instructions. I will adhere to these goals and the provided educational materials after my discharge from the hospital.  Patient/Caregiver Signature _______________________________ Date __________  Clinician Signature _______________________________________ Date __________  Please bring this form and your medication list with you to all your follow-up doctor's appointments.

## 2022-08-14 NOTE — Progress Notes (Signed)
Physical Therapy Session Note  Patient Details  Name: Terry [name] MRN: [id number] Date of Birth: 02-08-1951  Today's Date: 08/14/2022 PT Individual Time: 1402-1450 PT Individual Time Calculation (min): 48 min   Short Term Goals: Week 1:  PT Short Term Goal 1 (Week 1): = to LTGs based on ELOS  Skilled Therapeutic Interventions/Progress Updates:    Pt received supine in bed and agreeable to therapy. Pt transferred to sitting at the EOB w/ supervision and donned shoes w/ supervision assist. Pt performed ~176ft of gait to main therapy gym w/ close supervision using rollator. Pt ascended/descended 12, 4" steps w/ close supervision using BHR. Pt showed functional eccentric control when descending stairs. Pt performed ~132ft of gait w/o AD and close supervision. Standing in front of bar pt performed 2x30 seconds of SLB on each LE using bar for UE support and CGA. Pt struggled more so on the R foot than the L.  Pt engaged in agility ladder drills to work on dynamic balance, motor planning/ control, and LE NMR. Activity performed w/ CGA for stability - Lateral  stepping 2x (once w/ 1.5# AW) - Forward stepping w/ cone taps, mod vc for pt to wt shift on the stance leg, find balance then tap cone w/ the contralateral LE. Pt exhibited minor postural sways, but no LOB.  -Forward stepping w/ lateral LE swing around cone. Pt cued to swing LE around cone slowly to emphasis motor control. Pt struggled w/ eccentric control and SLB.   Pt ambulated ~160ft back to room w/o AD and close supervision. Pt Doffed shoes w/ total A for time sake and donned therapy sock w/ supervision. Pt performed oral hygiene at the sink standing w/ close supervision. Pt returned to bed stand<>sitting at EOB<>supine w/ supervision. Pt left w/ call bell in reach, bed alarm on and all needs met.   Therapy Documentation Precautions:  Precautions Precautions: Sternal, Fall Precaution Comments: verbal review for Move in the Tube  precautions Restrictions Weight Bearing Restrictions: No RUE Weight Bearing:  (sternal precautions) LUE Weight Bearing:  (sternal precautions) General:  Pain: pt denies any pain       Therapy/Group: Individual Therapy  Neta Upadhyay 08/14/2022, 3:18 PM

## 2022-08-15 LAB — GLUCOSE, CAPILLARY: Glucose-Capillary: 94 mg/dL (ref 70–99)

## 2022-08-15 NOTE — Progress Notes (Signed)
Physical Therapy Session Note  Patient Details  Name: Terry [name] MRN: [id number] Date of Birth: Sep 10, 1950  Today's Date: 08/15/2022 PT Individual Time: 1015-1050 PT Individual Time Calculation (min): 35 min   Short Term Goals: Week 1:  PT Short Term Goal 1 (Week 1): = to LTGs based on ELOS  Skilled Therapeutic Interventions/Progress Updates:    Pt received sitting in w/c with his wife, [name], present for hands-on family education/training and pt agreeable. Pt/wife report no specific questions/concerns at this time. Pt/wife confirm pt has a Psychologist, forensic at home. Both in agreement with plan for recommendation of follow-up OPPT. Sit<>stands using rollator with supervision for safety with balance and max progressed to min cuing for rollator brake management throughout session. Gait training ~150-240ft ~3x throughout session using rollator with close supervision for safety - pt achieves reciprocal stepping pattern with adequate gait speed and no overt LOBs. Simulated curb step navigation using rolltaor with therapist providing visual demonstration and education on sequencing of AD management and LE stepping - pt repeats ~6 repetitions with max progressed to min cuing for proper sequencing of AD management as pt with difficulty recalling and sequencing this task initially - CGA progressed to close supervision for balance safety. Simulated ambulatory car transfer using rollator with cuing for safe transfer technique and pt demonstrating understanding with supervision for balance safety. Gait training up/down ramp using rollator with supervision for safety and min cuing for use of brakes to assist with AD management on slopes. Stair navigation training ascending/descending 12 steps (6" height) using B HRs with close stand by assist for safety - pt self-selecting reciprocal stepping pattern with good control - min cuing to bring hands forward sooner on HRs during descent due to minor posterior lean. Pt's  wife reports pt will have no need to go upstairs at home. Pt/wife report feeling good about pt's CLOF and pt's wife states his balance looks much better than it did initially. Pt/wife with no additional questions/concerns. Pt left seated in w/c in the care of his wife in preparation for next therapy session.  Therapy Documentation Precautions:  Precautions Precautions: Sternal, Fall Precaution Comments: verbal review for Move in the Tube precautions Restrictions Weight Bearing Restrictions: No RUE Weight Bearing:  (sternal precautions) LUE Weight Bearing:  (sternal precautions)   Pain: No complaints of pain throughout session.    Therapy/Group: Individual Therapy  Tawana Scale , PT, DPT, NCS, CSRS 08/15/2022, 7:58 AM

## 2022-08-15 NOTE — Progress Notes (Signed)
Occupational Therapy Session Note  Patient Details  Name: Terry [name] MRN: [id number] Date of Birth: 1950/08/04  Today's Date: 08/15/2022 OT Individual Time: [id number]; [id number] OT Individual Time Calculation (min): 90 min      Skilled Therapeutic Interventions/Progress Updates: Patient seen for ADL training.Received resting in bed. Agreeable to OT treatment. Patient seen for ADL's as listed below. Patient performed functional transfers with walker at a Mod I level. No assist needed for gathering clothing or for standing shower. Patient with good activity tolerance and safety awareness following sternal precautions.  Visit 2: Family training. Wife present. Patient and wife educated on sternal precautions and equipment recommended for use upon discharge. Patient has built in shower bench and raised toilet heights at home. Ambulated with the use of a rollator to the patient kitchen and bedroom. Discussed safety when getting up from furniture at home, to ensure patient followed precautions and does not push up from a low seat. Followed with kitchen mobility and safety. Patient able to access items safely from cupboards and reports good set up at home. Wife expressed willingness to assist as much as needed including setting items out for patient to prevent stooping or opening heavy drawers. Discussed fall prevention and the need for assist to get off of the floor due to sternal precautions. Patient with good safety awareness. Continue skilled OT POC to work further on IADL skills in preparation for discharge home.     Therapy Documentation Precautions:  Precautions Precautions: Sternal, Fall Precaution Comments: verbal review for Move in the Tube precautions Restrictions Weight Bearing Restrictions: No RUE Weight Bearing:  (sternal precautions) LUE Weight Bearing:  (sternal precautions)    Pain: Pain Assessment Pain Scale: Faces Pain Score: 0-No pain Faces Pain Scale: No  hurt ADL: ADL Eating: Independent Where Assessed-Eating: Wheelchair Grooming: Independent standing at sink Bathing: Distant Supervision/ Mod I for shower with handheld shower and bench Upper Body Dressing: Independent  Where Assessed-Upper Body Dressing: Edge of bed Lower Body Dressing: Mod I/ Independent (increased time, donned pants seated) Where Assessed-Lower Body Dressing: Edge of bed Toileting: Mod I  Where Assessed-Toileting: Glass blower/designer: Mod I  Toilet Transfer Method: Counselling psychologist: Energy manager: Buyer, retail Method: Heritage manager: Radio broadcast assistant   Therapy/Group: Individual Therapy  Hermina Barters 08/15/2022, 12:39 PM

## 2022-08-15 NOTE — Progress Notes (Signed)
Physical Therapy Discharge Summary  Patient Details  Name: Terry [name] MRN: [id number] Date of Birth: 02-16-1951  Date of Discharge from PT service:{Time; dates multiple:304500300}  {CHL IP REHAB PT TIME CALCULATION:304800500}   Patient has met {NUMBERS 0-12:18577} of {NUMBERS 0-12:18577} long term goals due to {due [id number].  Patient to discharge at an ambulatory level Supervision using rollator.   Patient's care partner attended education and is independent to provide the necessary physical and cognitive assistance at discharge.  Reasons goals not met: ***  Recommendation:  Patient will benefit from ongoing skilled PT services in outpatient setting to continue to advance safe functional mobility, address ongoing impairments in dynamic standing balance, dynamic gait training, community reintegration, and minimize fall risk.  Equipment: No equipment provided, has all necessary DME  Reasons for discharge: treatment goals met and discharge from hospital  Patient/family agrees with progress made and goals achieved: Yes  PT Discharge Precautions/Restrictions Precautions Precautions: Sternal;Fall Precaution Comments: verbal review for Move in the Tube precautions Restrictions Weight Bearing Restrictions: No Pain *** Pain Assessment Pain Scale: Faces Pain Score: 0-No pain Faces Pain Scale: No hurt Pain Interference ***   Vision/Perception  Vision - History Ability to See in Adequate Light: 1 Impaired (needs glasses) Perception Perception: Within Functional Limits Praxis Praxis: Intact  Cognition Overall Cognitive Status: Impaired/Different from baseline Arousal/Alertness: Awake/alert Orientation Level: Oriented X4 Attention: Focused;Sustained Focused Attention: Appears intact Sustained Attention: Appears intact Awareness: Impaired Problem Solving: Impaired Safety/Judgment: Impaired Sensation Sensation Light Touch: Impaired Detail Central sensation comments:  reports R LE feels "grainy" like "sandpaper" compared to LLE, but states that was present prior to CABG but now is more intense - reports new onset of  L UE feeling numb compared to R UE present since CABG Light Touch Impaired Details: Impaired LUE;Impaired RLE Hot/Cold: Not tested Proprioception: Appears Intact Stereognosis: Not tested Coordination Gross Motor Movements are Fluid and Coordinated: Yes Motor  Motor Motor: Other (comment);Abnormal postural alignment and control Motor - Discharge Observations: generalized deconditioning s/p heart surgery  Mobility Bed Mobility Bed Mobility: Sit to Supine;Supine to Sit Supine to Sit: Supervision/Verbal cueing Sit to Supine: Supervision/Verbal cueing Transfers Transfers: Sit to Stand;Stand to Sit;Stand Pivot Transfers Sit to Stand: Supervision/Verbal cueing Stand to Sit: Supervision/Verbal cueing Stand Pivot Transfers: Supervision/Verbal cueing Stand Pivot Transfer Details: Verbal cues for safe use of DME/AE Stand Pivot Transfer Details (indicate cue type and reason): verbal cues for brake management Transfer (Assistive device): Rollator Locomotion  Gait Ambulation: Yes Gait Assistance: Supervision/Verbal cueing Gait Distance (Feet): 200 Feet Assistive device: Rollator Gait Assistance Details: Verbal cues for safe use of DME/AE;Verbal cues for precautions/safety Gait Gait: Yes Gait Pattern: Within Functional Limits Gait velocity: decreased Stairs / Additional Locomotion Stairs: Yes Stairs Assistance: Supervision/Verbal cueing Stair Management Technique: Two rails;Alternating pattern Number of Stairs: 12 Height of Stairs: 6 Ramp: Supervision/Verbal cueing (using rollator) Curb: Contact Guard/Touching assist (using rollator) Wheelchair Mobility Wheelchair Mobility: No  Trunk/Postural Assessment  Cervical Assessment Cervical Assessment: Exceptions to [facility] (slight forward head) Thoracic Assessment Thoracic Assessment:  Exceptions to [facility] (mild rounded shoulders with Pt holding self in slight guarded posiiton d/t sternal pain) Lumbar Assessment Lumbar Assessment: Exceptions to [facility] (posterior pelvic tilt) Postural Control Postural Control: Within Functional Limits  Balance ***   Extremity Assessment            Tawana Scale , PT, DPT, NCS, CSRS 08/15/2022, 12:16 PM

## 2022-08-15 NOTE — Progress Notes (Signed)
Speech Language Pathology Daily Session Note  Patient Details  Name: Terry [name] MRN: [id number] Date of Birth: [birth year]  Today's Date: 08/15/2022 SLP Individual Time: [id number] SLP Individual Time Calculation (min): 30 min  Short Term Goals: Week 1: SLP Short Term Goal 1 (Week 1): STG's = LTG's due to ELOS  Skilled Therapeutic Interventions: Family education completed with patient and wife. Pt educated on daily cognitively stimulating tasks. He was encouraged to continue using pill organizer for medication and to have wife's assistance for attention to details and to avoid errors. He was educated on eliminating distractions and to double check his medication trays for errors. Cont with therapy per plan of care.   Pain Pain Assessment Pain Scale: Faces Pain Score: 0-No pain Faces Pain Scale: No hurt  Therapy/Group: Individual Therapy  Darrol Poke Vandy Fong 08/15/2022, 11:32 AM

## 2022-08-15 NOTE — Progress Notes (Signed)
PROGRESS NOTE   Subjective/Complaints: No acute complaints.  No events overnight.  Patient asking when he can go home, but does not push discharge date.  ROS- neg CP, SOB, N/V/D  Objective:   No results found. No results for input(s): "WBC", "HGB", "HCT", "PLT" in the last 72 hours.  No results for input(s): "NA", "K", "CL", "CO2", "GLUCOSE", "BUN", "CREATININE", "CALCIUM" in the last 72 hours.   Intake/Output Summary (Last 24 hours) at 08/15/2022 2232 Last data filed at 08/15/2022 2052 Gross per 24 hour  Intake 600 ml  Output --  Net 600 ml         Physical Exam: Vital Signs Blood pressure (!) 104/51, pulse 72, temperature 98.7 F (37.1 C), temperature source Oral, resp. rate 19, height 5\' 8"  (1.727 m), weight 70.5 kg, SpO2 99 %.   General: No acute distress Mood and affect are appropriate Heart: Regular rate and rhythm no rubs murmurs or extra sounds Lungs: Clear to auscultation, breathing unlabored, no rales or wheezes Abdomen: Positive bowel sounds, soft nontender to palpation, nondistended Extremities: No clubbing, cyanosis, or edema Skin: No evidence of breakdown, no evidence of rash sternotomy incision CDI with dermabond, RIght saphenous vein harvest side with ecchymosis, incision without drainage  Neurologic: Cranial nerves II through XII intact, motor strength is 5/5 in bilateral deltoid, bicep, tricep, grip, hip flexor, knee extensors, ankle dorsiflexor and plantar flexor -unchanged Oriented to person place time,pt unable to remember name of cardiac surgeon, remembers neurosurgeon name  Musculoskeletal: Full range of motion in all 4 extremities. No joint swelling    Assessment/Plan: 1. Functional deficits which require 3+ hours per day of interdisciplinary therapy in a comprehensive inpatient rehab setting. Physiatrist is providing close team supervision and 24 hour management of active medical problems  listed below. Physiatrist and rehab team continue to assess barriers to discharge/monitor patient progress toward functional and medical goals  Care Tool:  Bathing    Body parts bathed by patient: Right arm, Face, Left arm, Chest, Abdomen, Front perineal area, Buttocks, Right upper leg, Left upper leg, Right lower leg, Left lower leg   Body parts bathed by helper: Buttocks     Bathing assist Assist Level: Set up assist     Upper Body Dressing/Undressing Upper body dressing   What is the patient wearing?: Pull over shirt    Upper body assist Assist Level: Independent    Lower Body Dressing/Undressing Lower body dressing      What is the patient wearing?: Underwear/pull up, Pants     Lower body assist Assist for lower body dressing: Supervision/Verbal cueing     Toileting Toileting    Toileting assist Assist for toileting: Supervision/Verbal cueing     Transfers Chair/bed transfer  Transfers assist     Chair/bed transfer assist level: Supervision/Verbal cueing Chair/bed transfer assistive device: Other (rollator)   Locomotion Ambulation   Ambulation assist      Assist level: Supervision/Verbal cueing Assistive device: Rollator Max distance: 271ft   Walk 10 feet activity   Assist     Assist level: Supervision/Verbal cueing Assistive device: Rollator   Walk 50 feet activity   Assist    Assist level: Minimal Assistance -  Patient > 75% Assistive device: No Device    Walk 150 feet activity   Assist    Assist level: Minimal Assistance - Patient > 75% Assistive device: No Device    Walk 10 feet on uneven surface  activity   Assist     Assist level: Contact Guard/Touching assist Assistive device: Other (comment) (railing)   Wheelchair     Assist Is the patient using a wheelchair?: No Type of Wheelchair: Manual    Wheelchair assist level: Dependent - Patient 0%      Wheelchair 50 feet with 2 turns  activity    Assist        Assist Level: Dependent - Patient 0%   Wheelchair 150 feet activity     Assist      Assist Level: Dependent - Patient 0%   Blood pressure (!) 104/51, pulse 72, temperature 98.7 F (37.1 C), temperature source Oral, resp. rate 19, height 5\' 8"  (1.727 m), weight 70.5 kg, SpO2 99 %.  Medical Problem List and Plan: 1. Functional deficits secondary to debility             -patient may  shower             -ELOS/Goals: 3/19 ModI/Sup goals     2.  Antithrombotics: -DVT/anticoagulation:  Pharmaceutical: Xarelto             -antiplatelet therapy: aspirin 81 mg daily   3. Pain Management: Tylenol as needed   4. Mood/Behavior/Sleep: LCSW to evaluate and provide emotional support             -antipsychotic agents: n/a   5. Neuropsych/cognition: This patient is capable of making decisions on his own behalf with assistance for complex decisions.   6. Skin/Wound Care: Routine skin care checks   7. Fluids/Electrolytes/Nutrition: Routine Is and Os and follow-up chemistries   8: Hypertension: monitor TID and prn; on Lopressor 12.5 mg BID Vitals:   08/15/22 1300 08/15/22 1927  BP: (!) 94/57 (!) 104/51  Pulse: 68 72  Resp: 16 19  Temp: 98.1 F (36.7 C) 98.7 F (37.1 C)  SpO2: 100% 99%      9: Hyperlipidemia: continue statin   10: CAD s/p CABG on 08/04/22             -sternal precautions x 6 wks             -continue Lopressor 12.5 mg BID   11: History of PE on Xarelto   12: Multiple prior spine surgeries.  Dr. Ellene Route (lumbar), hx L3-5 fusion with extension to L2-3 fusion in Oct 2023- no back pain or residual leg pain   13: History of Parkinson's disease: Continue Sinemet and Aricept   14.  Pt c/o constipation , had stool yesterday not receiving prn laxative , discussed increasing fruit and vegetable intake  LOS: 5 days A FACE TO FACE EVALUATION WAS PERFORMED  Gertie Gowda 08/15/2022, 10:32 PM

## 2022-08-15 NOTE — Progress Notes (Addendum)
Speech Language Pathology Daily Session Note  Patient Details  Name: Terry [name] MRN: [id number] Date of Birth: 12-Apr-1951  Today's Date: 08/15/2022 SLP Individual Time: [id number] SLP Individual Time Calculation (min): 30 min  Short Term Goals: Week 1: SLP Short Term Goal 1 (Week 1): STG's = LTG's due to ELOS  Skilled Therapeutic Interventions: Skilled intervention focused on cognition. Calendar organization task with patient adding scheduled events to calendar completed with min A for attentin to details. He mistakenly wrote information on wrong date x2. He Required min-mod A to answer problem solving questions related to dates and times. Educated on strategies to use at home to eliminate distractions and errors. Pt and wife verbalized understanding. Family education will be completed this afternoon.   Pain Pain Assessment Pain Scale: Faces Pain Score: 0-No pain Faces Pain Scale: No hurt  Therapy/Group: Individual Therapy  Darrol Poke Godfrey Tritschler 08/15/2022, 11:31 AM

## 2022-08-16 NOTE — Progress Notes (Signed)
PROGRESS NOTE   Subjective/Complaints: No acute complaints.  No events overnight.   Sternal site nontender, patient feels he is overall doing well. LBM 3/17  ROS- neg CP, SOB, N/V/D  Objective:   No results found. No results for input(s): "WBC", "HGB", "HCT", "PLT" in the last 72 hours.  No results for input(s): "NA", "K", "CL", "CO2", "GLUCOSE", "BUN", "CREATININE", "CALCIUM" in the last 72 hours.   Intake/Output Summary (Last 24 hours) at 08/16/2022 2031 Last data filed at 08/16/2022 1806 Gross per 24 hour  Intake 900 ml  Output --  Net 900 ml         Physical Exam: Vital Signs Blood pressure 104/66, pulse 80, temperature 98.6 F (37 C), resp. rate 18, height 5\' 8"  (1.727 m), weight 71 kg, SpO2 100 %.   General: No acute distress Mood and affect are appropriate Heart: Regular rate and rhythm no rubs murmurs or extra sounds.  Lungs: Clear to auscultation, breathing unlabored, no rales or wheezes Abdomen: Positive bowel sounds, soft nontender to palpation, nondistended Extremities: No clubbing, cyanosis, or edema Skin: No evidence of breakdown,  + sternotomy incision CDI with dermabond - unchanged + RIght saphenous vein harvest side with ecchymosis, incision without drainage Neurologic: Cranial nerves II through XII intact, motor strength is 5/5 in bilateral deltoid, bicep, tricep, grip, hip flexor, knee extensors, ankle dorsiflexor and plantar flexor -unchanged Oriented to person place time,pt unable to remember name of cardiac surgeon, remembers neurosurgeon name  Musculoskeletal: Full range of motion in all 4 extremities. No joint swelling    Assessment/Plan: 1. Functional deficits which require 3+ hours per day of interdisciplinary therapy in a comprehensive inpatient rehab setting. Physiatrist is providing close team supervision and 24 hour management of active medical problems listed below. Physiatrist  and rehab team continue to assess barriers to discharge/monitor patient progress toward functional and medical goals  Care Tool:  Bathing    Body parts bathed by patient: Right arm, Face, Left arm, Chest, Abdomen, Front perineal area, Buttocks, Right upper leg, Left upper leg, Right lower leg, Left lower leg   Body parts bathed by helper: Buttocks     Bathing assist Assist Level: Set up assist     Upper Body Dressing/Undressing Upper body dressing   What is the patient wearing?: Pull over shirt    Upper body assist Assist Level: Independent    Lower Body Dressing/Undressing Lower body dressing      What is the patient wearing?: Underwear/pull up, Pants     Lower body assist Assist for lower body dressing: Supervision/Verbal cueing     Toileting Toileting    Toileting assist Assist for toileting: Supervision/Verbal cueing     Transfers Chair/bed transfer  Transfers assist     Chair/bed transfer assist level: Supervision/Verbal cueing Chair/bed transfer assistive device: Other (rollator)   Locomotion Ambulation   Ambulation assist      Assist level: Supervision/Verbal cueing Assistive device: Rollator Max distance: 251ft   Walk 10 feet activity   Assist     Assist level: Supervision/Verbal cueing Assistive device: Rollator   Walk 50 feet activity   Assist    Assist level: Minimal Assistance - Patient >  75% Assistive device: No Device    Walk 150 feet activity   Assist    Assist level: Minimal Assistance - Patient > 75% Assistive device: No Device    Walk 10 feet on uneven surface  activity   Assist     Assist level: Contact Guard/Touching assist Assistive device: Other (comment) (railing)   Wheelchair     Assist Is the patient using a wheelchair?: No Type of Wheelchair: Manual    Wheelchair assist level: Dependent - Patient 0%      Wheelchair 50 feet with 2 turns activity    Assist        Assist Level:  Dependent - Patient 0%   Wheelchair 150 feet activity     Assist      Assist Level: Dependent - Patient 0%   Blood pressure 104/66, pulse 80, temperature 98.6 F (37 C), resp. rate 18, height 5\' 8"  (1.727 m), weight 71 kg, SpO2 100 %.  Medical Problem List and Plan: 1. Functional deficits secondary to debility             -patient may  shower             -ELOS/Goals: 3/19 ModI/Sup goals   2.  Antithrombotics: -DVT/anticoagulation:  Pharmaceutical: Xarelto             -antiplatelet therapy: aspirin 81 mg daily   3. Pain Management: Tylenol as needed   4. Mood/Behavior/Sleep: LCSW to evaluate and provide emotional support             -antipsychotic agents: n/a   5. Neuropsych/cognition: This patient is capable of making decisions on his own behalf with assistance for complex decisions.   6. Skin/Wound Care: Routine skin care checks   7. Fluids/Electrolytes/Nutrition: Routine Is and Os and follow-up chemistries   8: Hypertension: monitor TID and prn; on Lopressor 12.5 mg BID - normotensive, monitor Vitals:   08/16/22 1301 08/16/22 1941  BP: 94/62 104/66  Pulse: 82 80  Resp: 16 18  Temp: 98.8 F (37.1 C) 98.6 F (37 C)  SpO2: 99% 100%      9: Hyperlipidemia: continue statin   10: CAD s/p CABG on 08/04/22             -sternal precautions x 6 wks             -continue Lopressor 12.5 mg BID   11: History of PE on Xarelto   12: Multiple prior spine surgeries.  Dr. Ellene Route (lumbar), hx L3-5 fusion with extension to L2-3 fusion in Oct 2023- no back pain or residual leg pain   13: History of Parkinson's disease: Continue Sinemet and Aricept   14.  Pt c/o constipation , had stool yesterday not receiving prn laxative , discussed increasing fruit and vegetable intake   - LBM 3/17  LOS: 6 days A FACE TO FACE EVALUATION WAS PERFORMED  Gertie Gowda 08/16/2022, 8:31 PM

## 2022-08-17 ENCOUNTER — Other Ambulatory Visit (HOSPITAL_COMMUNITY): Payer: Self-pay

## 2022-08-17 LAB — BASIC METABOLIC PANEL
Anion gap: 9 (ref 5–15)
BUN: 12 mg/dL (ref 8–23)
CO2: 23 mmol/L (ref 22–32)
Calcium: 9 mg/dL (ref 8.9–10.3)
Chloride: 106 mmol/L (ref 98–111)
Creatinine, Ser: 0.93 mg/dL (ref 0.61–1.24)
GFR, Estimated: >60 mL/min (ref >60)
Glucose, Bld: 103 mg/dL — ABNORMAL HIGH (ref 70–99)
Potassium: 4 mmol/L (ref 3.5–5.1)
Sodium: 138 mmol/L (ref 135–145)

## 2022-08-17 MED ORDER — ACETAMINOPHEN 325 MG PO TABS: 325.0000 mg | ORAL_TABLET | ORAL | Status: DC | PRN

## 2022-08-17 MED ORDER — METOPROLOL TARTRATE 25 MG PO TABS
12.5000 mg | ORAL_TABLET | Freq: Two times a day (BID) | ORAL | 0 refills | Status: DC
  Filled 2022-08-17: qty 15, 15d supply, fill #0

## 2022-08-17 NOTE — Plan of Care (Signed)
  Problem: RH Balance Goal: LTG Patient will maintain dynamic sitting balance (PT) Description: LTG:  Patient will maintain dynamic sitting balance with assistance during mobility activities (PT) Outcome: Completed/Met Goal: LTG Patient will maintain dynamic standing balance (PT) Description: LTG:  Patient will maintain dynamic standing balance with assistance during mobility activities (PT) Outcome: Completed/Met   Problem: Sit to Stand Goal: LTG:  Patient will perform sit to stand with assistance level (PT) Description: LTG:  Patient will perform sit to stand with assistance level (PT) Outcome: Completed/Met   Problem: RH Bed Mobility Goal: LTG Patient will perform bed mobility with assist (PT) Description: LTG: Patient will perform bed mobility with assistance, with/without cues (PT). Outcome: Completed/Met   Problem: RH Bed to Chair Transfers Goal: LTG Patient will perform bed/chair transfers w/assist (PT) Description: LTG: Patient will perform bed to chair transfers with assistance (PT). Outcome: Completed/Met   Problem: RH Car Transfers Goal: LTG Patient will perform car transfers with assist (PT) Description: LTG: Patient will perform car transfers with assistance (PT). Outcome: Completed/Met   Problem: RH Ambulation Goal: LTG Patient will ambulate in controlled environment (PT) Description: LTG: Patient will ambulate in a controlled environment, # of feet with assistance (PT). Outcome: Completed/Met Goal: LTG Patient will ambulate in home environment (PT) Description: LTG: Patient will ambulate in home environment, # of feet with assistance (PT). Outcome: Completed/Met   

## 2022-08-17 NOTE — Progress Notes (Signed)
Occupational Therapy Discharge Summary  Patient Details  Name: Terry [name] MRN: [id number] Date of Birth: February 12, 1951  Date of Discharge from [place] 18, 2024  Today's Date: 08/17/2022 OT Individual Time: 0904-1000 OT Individual Time Calculation (min): 56 min  OT Individual Time: 1356-1445 OT Individual Time Calculation (min): 49 min   Patient has met 12 of 12 long term goals due to improved activity tolerance, improved balance, postural control, ability to compensate for deficits, and improved awareness.  Patient to discharge at overall Supervision level.  Patient's care partner is independent to provide the necessary physical and cognitive assistance at discharge.    Reasons goals not met: All goals met  Recommendation:  No further OT services recommended at this time as Pt is close to his baseline and able to completed with supervision from his care partner.   Equipment: No equipment provided  Reasons for discharge: treatment goals met and discharge from hospital  Patient/family agrees with progress made and goals achieved: Yes  OT Discharge Skilled Therapeutic Interventions/Progress Updates:  AM Session: Pt received semi-reclined in bed presenting to be in good spirits and receptive to skilled OT session. Pt reporting 0/10 pain and requesting to take shower this AM. Pt transitioned to EOB with supervision requiring min VB cues to maintain sternal precautions. Pt completed functional mobility in room to retrieve clothing from drawers and closets and set-up room for dressing tasks. Pt ambulated to bathroom and completed 3/3 toileting tasks with supervision without AD. Pt doffed clothing in standing using grab bars for balance with min VB cues provided for safety. Pt completed U/LB bathing standing in shower for endurance and dynamic standing balance challenge with supervision no LOB or SOB noted. Grab bars utilized during shower to increase safety. Educated Pt on need to  shower in sitting upon d/c to increase safety and prevent falls with Pt receptive. Following shower, Pt completed U/LB dressing seated EOB with supervision donning button up shirt, pants, socks, and shoes. Pt completed grooming/hygiene tasks standing at sink without AD for endurance and dynamic standing balance.   PM Session:  Pt handed off in therapy gym from previous Pt session. Pt presenting to be in good spirits and receptive to skilled OT session with 0/10 pain reported. Pt participated in functional cognition hospital navigation activity to address endurance and cognition deficits in preparation for d/c tomorrow. Pt instructed to utilize hospital maps and signs to navigate to location of "[facility]". Oriented Pt to current location at beginning of task and provided an example of hospital map to support success when navigating. Pt able to navigate throughout hospital without AD close supervision for safety not LOB or SOB. Utilized hospital signs to find location with min questioning cues to locate signs in hallway. Single ret break provided during tasks. During activity, incorporated simulated community mobility and functional mobility into task with Pt able to navigate over curbs and across sidewalks with supervision no AD. Pt able to follow sings to navigate back to room with min cueing required. Upon returning to therapy gym, Pt participate in dynamic standing balance and problem solving activity standing at elevated table without AD. Pt participated in connect-4 game x3 trials with min cues required for problem solving and awareness. Pt ambulated back to room and was left sitting EOB with all needs met and no bed alarm d/t Pt mod I in room. Nursing staff aware of Pt status.   Precautions/Restrictions  Precautions Precautions: Sternal;Fall Precaution Comments: verbal review for Move in the Tube  precautions Restrictions Weight Bearing Restrictions: Yes RUE Weight Bearing:  (sternal  precuations) LUE Weight Bearing:  (sternal precautions) General Chart Reviewed: Yes Additional Pertinent History: Parkisons, arthritis Family/Caregiver Present: No   Pain Pain Assessment Pain Scale: 0-10 Pain Score: 0-No pain ADL ADL Eating: Independent Where Assessed-Eating: Wheelchair Grooming: Supervision/safety Where Assessed-Grooming: Standing at sink Upper Body Bathing: Supervision/safety Where Assessed-Upper Body Bathing: Shower Lower Body Bathing: Supervision/safety Where Assessed-Lower Body Bathing: Shower Upper Body Dressing: Supervision/safety Where Assessed-Upper Body Dressing: Edge of bed Lower Body Dressing: Supervision/safety Where Assessed-Lower Body Dressing: Edge of bed Toileting: Supervision/safety Where Assessed-Toileting: Glass blower/designer: Close supervision Toilet Transfer Method: Counselling psychologist: Energy manager: Not assessed Social research officer, government: Close supervision Social research officer, government Method: Heritage manager: Grab bars Vision Baseline Vision/History: 1 Wears glasses (pt awaiting retina surgery) Patient Visual Report: No change from baseline Vision Assessment?: No apparent visual deficits Perception  Perception: Within Functional Limits Praxis Praxis: Impaired Praxis Impairment Details: Motor planning Praxis-Other Comments: Pt with diagnosis of parkinsons at baseline Cognition Cognition Overall Cognitive Status: History of cognitive impairments - at baseline Arousal/Alertness: Awake/alert Orientation Level: Person;Place;Situation Person: Oriented Place: Oriented Situation: Oriented Memory: Impaired Memory Impairment: Retrieval deficit;Decreased recall of new information;Decreased short term memory Decreased Short Term Memory: Functional complex Attention: Focused;Sustained Focused Attention: Appears intact Sustained Attention: Appears intact Selective Attention: Appears  intact Selective Attention Impairment: Verbal basic;Functional basic Awareness: Impaired Awareness Impairment: Anticipatory impairment Problem Solving: Appears intact Organizing: Appears intact Self Monitoring: Impaired Self Monitoring Impairment: Functional complex;Verbal complex Behaviors: Impulsive (mild impulsive with motor movements) Safety/Judgment: Impaired Brief Interview for Mental Status (BIMS) Repetition of Three Words (First Attempt): 3 Temporal Orientation: Year: Correct Temporal Orientation: Month: Accurate within 5 days Temporal Orientation: Day: Correct Recall: "Sock": Yes, no cue required Recall: "Blue": Yes, no cue required Recall: "Bed": Yes, no cue required BIMS Summary Score: 15 Sensation Sensation Light Touch: Appears Intact Hot/Cold: Appears Intact Proprioception: Appears Intact Stereognosis: Not tested Coordination Gross Motor Movements are Fluid and Coordinated: Yes Fine Motor Movements are Fluid and Coordinated: Yes Finger Nose Finger Test: smooth and coordinated Motor  Motor Motor: Other (comment);Abnormal postural alignment and control Motor - Discharge Observations: generalized deconditioning s/p heart surgery Mobility  Bed Mobility Bed Mobility: Sit to Supine;Supine to Sit Supine to Sit: Supervision/Verbal cueing Sit to Supine: Supervision/Verbal cueing Transfers Sit to Stand: Supervision/Verbal cueing Stand to Sit: Supervision/Verbal cueing  Trunk/Postural Assessment  Cervical Assessment Cervical Assessment: Exceptions to [facility] (slight forward head) Thoracic Assessment Thoracic Assessment: Exceptions to [facility] (mild rounded shoulders with pt holding self in guarded position d/t sternal precautions) Lumbar Assessment Lumbar Assessment: Exceptions to [facility] Postural Control Postural Control: Deficits on evaluation (posterior pelvic tilt)  Balance Balance Balance Assessed: Yes Static Sitting Balance Static Sitting - Balance Support: Feet  supported Static Sitting - Level of Assistance: 6: Modified independent (Device/Increase time) Dynamic Sitting Balance Dynamic Sitting - Balance Support: Feet supported Dynamic Sitting - Level of Assistance: 5: Stand by assistance;6: Modified independent (Device/Increase time) (supervision) Static Standing Balance Static Standing - Balance Support: During functional activity Static Standing - Level of Assistance: 6: Modified independent (Device/Increase time);5: Stand by assistance (supervision) Dynamic Standing Balance Dynamic Standing - Balance Support: During functional activity Dynamic Standing - Level of Assistance: 5: Stand by assistance Extremity/Trunk Assessment RUE Assessment RUE Assessment: Within Functional Limits Passive Range of Motion (PROM) Comments: WFL for range assessed d/t sternal precuations Active Range of Motion (AROM) Comments: WFL for range assed d/t sternal precautions  General Strength Comments: 4/5 LUE Assessment LUE Assessment: Within Functional Limits Passive Range of Motion (PROM) Comments: WFL for range assesed d/t sternal precuations Active Range of Motion (AROM) Comments: WFL for range assessed d/t steranl precautions General Strength Comments: 4/5   Robecca Fulgham A [employer] 08/17/2022, 12:58 PM

## 2022-08-17 NOTE — Progress Notes (Signed)
Inpatient Rehabilitation Care Coordinator Discharge Note   Patient Details  Name: Terry [name] MRN: [id number] Date of Birth: 10-18-1950   Discharge location: Home  Length of Stay: 8 Days  Discharge activity level: cga/sup  Home/community participation: patient and spouse  Patient response [id number] Literacy - How often do you need to have someone help you when you read instructions, pamphlets, or other written material from your doctor or pharmacy?: Never  Patient response [id number] Isolation - How often do you feel lonely or isolated from those around you?: Never  Services provided included: MD, RD, PT, OT, RN, SLP, CM, Pharmacy, TR, Neuropsych, SW  Financial Services:  Charity fundraiser Utilized: Medicare    Choices offered to/list presented to: spouse and daughter  Follow-up services arranged:  DME, [place]: N/A  Outpatient Servicies: [facility]- PT DME : N/A    Patient response to transportation need: Is the patient able to respond to transportation needs?: Yes In the past 12 months, has lack of transportation kept you from medical appointments or from getting medications?: No In the past 12 months, has lack of transportation kept you from meetings, work, or from getting things needed for daily living?: No    Comments (or additional information):  Patient/Family verbalized understanding of follow-up arrangements:  Yes  Individual responsible for coordination of the follow-up plan: self  Confirmed correct DME delivered: Dyanne Iha 08/17/2022    Dyanne Iha

## 2022-08-17 NOTE — Plan of Care (Signed)
  Problem: RH Problem Solving Goal: LTG Patient will demonstrate problem solving for (SLP) Description: LTG:  Patient will demonstrate problem solving for basic/complex daily situations with cues  (SLP) Outcome: Completed/Met Flowsheets Taken 08/17/2022 1313 by Wyn Forster, CCC-SLP LTG: Patient will demonstrate problem solving for (SLP): Basic daily situations Taken 08/13/2022 2209 by Helaine Chess A, CCC-SLP LTG Patient will demonstrate problem solving for:  Minimal Assistance - Patient > 75%  Moderate Assistance - Patient 50 - 74%   Problem: RH Awareness Goal: LTG: Patient will demonstrate awareness during functional activites type of (SLP) Description: LTG: Patient will demonstrate awareness during functional activites type of (SLP) Outcome: Completed/Met

## 2022-08-17 NOTE — Plan of Care (Signed)
  Problem: RH Balance Goal: LTG Patient will maintain dynamic standing with ADLs (OT) Description: LTG:  Patient will maintain dynamic standing balance with assist during activities of daily living (OT)  Outcome: Completed/Met   Problem: Sit to Stand Goal: LTG:  Patient will perform sit to stand in prep for activites of daily living with assistance level (OT) Description: LTG:  Patient will perform sit to stand in prep for activites of daily living with assistance level (OT) Outcome: Completed/Met   Problem: RH Bathing Goal: LTG Patient will bathe all body parts with assist levels (OT) Description: LTG: Patient will bathe all body parts with assist levels (OT) Outcome: Completed/Met   Problem: RH Dressing Goal: LTG Patient will perform upper body dressing (OT) Description: LTG Patient will perform upper body dressing with assist, with/without cues (OT). Outcome: Completed/Met Goal: LTG Patient will perform lower body dressing w/assist (OT) Description: LTG: Patient will perform lower body dressing with assist, with/without cues in positioning using equipment (OT) Outcome: Completed/Met   Problem: RH Toileting Goal: LTG Patient will perform toileting task (3/3 steps) with assistance level (OT) Description: LTG: Patient will perform toileting task (3/3 steps) with assistance level (OT)  Outcome: Completed/Met   Problem: RH Simple Meal Prep Goal: LTG Patient will perform simple meal prep w/assist (OT) Description: LTG: Patient will perform simple meal prep with assistance, with/without cues (OT). Outcome: Completed/Met   Problem: RH Light Housekeeping Goal: LTG Patient will perform light housekeeping w/assist (OT) Description: LTG: Patient will perform light housekeeping with assistance, with/without cues (OT). Outcome: Completed/Met   Problem: RH Toilet Transfers Goal: LTG Patient will perform toilet transfers w/assist (OT) Description: LTG: Patient will perform toilet transfers  with assist, with/without cues using equipment (OT) Outcome: Completed/Met   Problem: RH Tub/Shower Transfers Goal: LTG Patient will perform tub/shower transfers w/assist (OT) Description: LTG: Patient will perform tub/shower transfers with assist, with/without cues using equipment (OT) Outcome: Completed/Met   Problem: RH Memory Goal: LTG Patient will demonstrate ability for day to day recall/carry over during activities of daily living with assistance level (OT) Description: LTG:  Patient will demonstrate ability for day to day recall/carry over during activities of daily living with assistance level (OT). Outcome: Completed/Met   Problem: RH Awareness Goal: LTG: Patient will demonstrate awareness during functional activites type of (OT) Description: LTG: Patient will demonstrate awareness during functional activites type of (OT) Outcome: Completed/Met

## 2022-08-17 NOTE — Progress Notes (Signed)
PROGRESS NOTE   Subjective/Complaints:  Reviewed labs Pt without new issues, denies chest or  leg pain Asking for cheaper alternative to Xarelto   ROS- neg CP, SOB, N/V/D  Objective:   No results found. No results for input(s): "WBC", "HGB", "HCT", "PLT" in the last 72 hours.  Recent Labs    08/17/22 0536  NA 138  K 4.0  CL 106  CO2 23  GLUCOSE 103*  BUN 12  CREATININE 0.93  CALCIUM 9.0     Intake/Output Summary (Last 24 hours) at 08/17/2022 0831 Last data filed at 08/17/2022 0700 Gross per 24 hour  Intake 478 ml  Output --  Net 478 ml         Physical Exam: Vital Signs Blood pressure 114/74, pulse 80, temperature 98.6 F (37 C), resp. rate 17, height 5\' 8"  (1.727 m), weight 71 kg, SpO2 97 %.   General: No acute distress Mood and affect are appropriate Heart: Regular rate and rhythm no rubs murmurs or extra sounds.  Lungs: Clear to auscultation, breathing unlabored, no rales or wheezes Abdomen: Positive bowel sounds, soft nontender to palpation, nondistended Extremities: No clubbing, cyanosis, or edema Skin: No evidence of breakdown,  + sternotomy incision CDI with dermabond - unchanged + RIght saphenous vein harvest side with ecchymosis, incision without drainage Neurologic: Cranial nerves II through XII intact, motor strength is 5/5 in bilateral deltoid, bicep, tricep, grip, hip flexor, knee extensors, ankle dorsiflexor and plantar flexor -unchanged Oriented to person place time,pt unable to remember name of cardiac surgeon, remembers neurosurgeon name  Musculoskeletal: Full range of motion in all 4 extremities. No joint swelling    Assessment/Plan: 1. Functional deficits which require 3+ hours per day of interdisciplinary therapy in a comprehensive inpatient rehab setting. Physiatrist is providing close team supervision and 24 hour management of active medical problems listed below. Physiatrist  and rehab team continue to assess barriers to discharge/monitor patient progress toward functional and medical goals  Care Tool:  Bathing    Body parts bathed by patient: Right arm, Face, Left arm, Chest, Abdomen, Front perineal area, Buttocks, Right upper leg, Left upper leg, Right lower leg, Left lower leg   Body parts bathed by helper: Buttocks     Bathing assist Assist Level: Set up assist     Upper Body Dressing/Undressing Upper body dressing   What is the patient wearing?: Pull over shirt    Upper body assist Assist Level: Independent    Lower Body Dressing/Undressing Lower body dressing      What is the patient wearing?: Underwear/pull up, Pants     Lower body assist Assist for lower body dressing: Supervision/Verbal cueing     Toileting Toileting    Toileting assist Assist for toileting: Supervision/Verbal cueing     Transfers Chair/bed transfer  Transfers assist     Chair/bed transfer assist level: Supervision/Verbal cueing Chair/bed transfer assistive device: Other (rollator)   Locomotion Ambulation   Ambulation assist      Assist level: Supervision/Verbal cueing Assistive device: Rollator Max distance: 289ft   Walk 10 feet activity   Assist     Assist level: Supervision/Verbal cueing Assistive device: Rollator   Walk 50  feet activity   Assist    Assist level: Minimal Assistance - Patient > 75% Assistive device: No Device    Walk 150 feet activity   Assist    Assist level: Minimal Assistance - Patient > 75% Assistive device: No Device    Walk 10 feet on uneven surface  activity   Assist     Assist level: Contact Guard/Touching assist Assistive device: Other (comment) (railing)   Wheelchair     Assist Is the patient using a wheelchair?: No Type of Wheelchair: Manual    Wheelchair assist level: Dependent - Patient 0%      Wheelchair 50 feet with 2 turns activity    Assist        Assist Level:  Dependent - Patient 0%   Wheelchair 150 feet activity     Assist      Assist Level: Dependent - Patient 0%   Blood pressure 114/74, pulse 80, temperature 98.6 F (37 C), resp. rate 17, height 5\' 8"  (1.727 m), weight 71 kg, SpO2 97 %.  Medical Problem List and Plan: 1. Functional deficits secondary to debility             -patient may  shower             -ELOS/Goals: 3/19 ModI/Sup goals- anticipate d/c in am    2.  Antithrombotics: -DVT/anticoagulation:  Pharmaceutical: Xarelto             -antiplatelet therapy: aspirin 81 mg daily   3. Pain Management: Tylenol as needed   4. Mood/Behavior/Sleep: LCSW to evaluate and provide emotional support             -antipsychotic agents: n/a   5. Neuropsych/cognition: This patient is capable of making decisions on his own behalf with assistance for complex decisions.   6. Skin/Wound Care: Routine skin care checks   7. Fluids/Electrolytes/Nutrition: Routine Is and Os and follow-up chemistries   8: Hypertension: monitor TID and prn; on Lopressor 12.5 mg BID - normotensive, monitor Vitals:   08/16/22 1941 08/17/22 0531  BP: 104/66 114/74  Pulse: 80 80  Resp: 18 17  Temp: 98.6 F (37 C) 98.6 F (37 C)  SpO2: 100% 97%      9: Hyperlipidemia: continue statin   10: CAD s/p CABG on 08/04/22             -sternal precautions x 6 wks             -continue Lopressor 12.5 mg BID- HR controlled    11: History of PE on Xarelto- pt asking for alternatives due to cost , discussed that there are no DOACs that are generic at this time    12: Multiple prior spine surgeries.  Dr. Ellene Route (lumbar), hx L3-5 fusion with extension to L2-3 fusion in Oct 2023- no back pain or residual leg pain   13: History of Parkinson's disease: Continue Sinemet and Aricept   14.  Pt c/o constipation , had stool yesterday not receiving prn laxative , discussed increasing fruit and vegetable intake   - LBM 3/17  LOS: 7 days A FACE TO [place] E Rafeal Skibicki 08/17/2022, 8:31 AM

## 2022-08-17 NOTE — Progress Notes (Signed)
Speech Language Pathology Discharge Summary  Patient Details  Name: Terry [name] MRN: [id number] Date of Birth: [birth year]  Date of Discharge from [facility] service:August 17, 2022  Today's Date: 08/17/2022 SLP Individual Time: [id number] SLP Individual Time Calculation (min): 55 min   Skilled Therapeutic Interventions:  Pt seen for skilled SLP session to address functional cognitive goals. Pt completed mult-step verbal sequencing tasks accurately and logically with min cues needed to provide further details for each step. Pt completed visual and verbal safety tasks with 100% accuracy independently. Pt demonstrated good awareness and reasoning during structured and unstructured tasks. Pt left sitting upright in w/c with chair alarm on and call bell within reach.    Patient has met 2 of 2 long term goals.  Patient to discharge at [facility] level.  Reasons goals not met:   all goals met  Clinical Impression/Discharge Summary:  Patient has demonstrated functional gains toward cognitive goals meeting 2 of 2 long-term goals this admission.  Pt is currently communicating functional needs at the min assistance level with min A verbal cues to self correct problem solving errors.  Patient and family education is complete and patient to discharge at overall min A level. Patient's care partner is independent to provide the necessary physical and cognitive-communication assistance at discharge. Pt feels that he is at his cognitive baseline. Skilled SLP intervention is not indicated at next level of care.    Care Partner:  Caregiver Able to Provide Assistance: Yes  Type of Caregiver Assistance: Physical;Cognitive  Recommendation:  None (pt feels he is at his cognitive baseline)      Equipment:   n/a  Reasons for discharge: Discharged from hospital   Patient/Family Agrees with Progress Made and Goals Achieved: Yes    Wyn Forster 08/17/2022, 1:16 PM

## 2022-08-18 ENCOUNTER — Other Ambulatory Visit (HOSPITAL_COMMUNITY): Payer: Self-pay

## 2022-08-18 ENCOUNTER — Other Ambulatory Visit: Payer: Self-pay | Admitting: Cardiothoracic Surgery

## 2022-08-18 DIAGNOSIS — I2510 Atherosclerotic heart disease of native coronary artery without angina pectoris: Secondary | ICD-10-CM

## 2022-08-18 DIAGNOSIS — Z951 Presence of aortocoronary bypass graft: Secondary | ICD-10-CM

## 2022-08-18 NOTE — Progress Notes (Signed)
Patient ID: Terry [name], male   DOB: 1951-03-23, 72 y.o.   MRN: [id number]  SW followed up with patient with questions or concerns for discharge. Pt's spouse will be here to review discharge instructions with patient and PA. No additional questions.

## 2022-08-18 NOTE — Progress Notes (Signed)
Inpatient Rehabilitation Discharge Medication Review by a Pharmacist  A complete drug regimen review was completed for this patient to identify any potential clinically significant medication issues.   High Risk Drug Classes Is patient taking? Indication by Medication  Antipsychotic No   Anticoagulant Yes Rivaroxaban - hx PE  Antibiotic No    Opioid No   Antiplatelet Yes Aspirin - CAD   Hypoglycemics/insulin No    Vasoactive Medication Yes Metoprolol - HTN  Chemotherapy No    Other Yes Sinemet - Parkinson Gabapentin-pain Acetaminophen- PRN pain Vit B12 - supplement Donepezil - memory/dementia Melatonin - sleep Omeprazole - GERD ppx Rosuvastatin - HLD        Type of Medication Issue Identified Description of Issue Recommendation(s)  Drug Interaction(s) (clinically significant)        Duplicate Therapy        Allergy        No Medication Administration End Date        Incorrect Dose        Additional Drug Therapy Needed        Significant med changes from prior encounter (inform family/care partners about these prior to discharge). PTA medication Methocarbamol discontinued.  Patient has not taken any doses since PTA.     Discharge AVS clearly instructs patient to stop taking Methocarbamol.   Other            Clinically significant medication issues were identified that warrant physician communication and completion of prescribed/recommended actions by midnight of the next day:  No     Pharmacist comments: n/a     Time spent performing this drug regimen review (minutes): 15     Thank you for allowing pharmacy to be a part of this patient's care.   Nicole Cella, RPh Clinical Pharmacist [phone] 08/18/2022 8:31 AM

## 2022-08-18 NOTE — Progress Notes (Signed)
PROGRESS NOTE   Subjective/Complaints:  Pt excited about discharge , feels ready to go home, discussed no driving until cleared by [facility]TS MD ROS- neg CP, SOB, N/V/D  Objective:   No results found. No results for input(s): "WBC", "HGB", "HCT", "PLT" in the last 72 hours.  Recent Labs    08/17/22 0536  NA 138  K 4.0  CL 106  CO2 23  GLUCOSE 103*  BUN 12  CREATININE 0.93  CALCIUM 9.0     Intake/Output Summary (Last 24 hours) at 08/18/2022 0729 Last data filed at 08/17/2022 1746 Gross per 24 hour  Intake 295 ml  Output --  Net 295 ml         Physical Exam: Vital Signs Blood pressure 112/64, pulse 62, temperature 98.4 F (36.9 C), resp. rate 17, height 5\' 8"  (1.727 m), weight 72 kg, SpO2 96 %.   General: No acute distress Mood and affect are appropriate Heart: Regular rate and rhythm no rubs murmurs or extra sounds.  Lungs: Clear to auscultation, breathing unlabored, no rales or wheezes Abdomen: Positive bowel sounds, soft nontender to palpation, nondistended Extremities: No clubbing, cyanosis, or edema Skin: No evidence of breakdown,  Musculoskeletal: Full range of motion in all 4 extremities. No joint swelling    Assessment/Plan: 1. Functional deficits debility due to CAD Stable for D/C today F/u PCP in 3-4 weeks F/u PM&R 2 weeks See D/C summary See D/C instructions  Care Tool:  Bathing    Body parts bathed by patient: Right arm, Face, Left arm, Chest, Abdomen, Front perineal area, Buttocks, Right upper leg, Left upper leg, Right lower leg, Left lower leg   Body parts bathed by helper: Buttocks     Bathing assist Assist Level: Set up assist     Upper Body Dressing/Undressing Upper body dressing   What is the patient wearing?: Pull over shirt    Upper body assist Assist Level: Independent    Lower Body Dressing/Undressing Lower body dressing      What is the patient wearing?:  Underwear/pull up, Pants     Lower body assist Assist for lower body dressing: Supervision/Verbal cueing     Toileting Toileting    Toileting assist Assist for toileting: Supervision/Verbal cueing     Transfers Chair/bed transfer  Transfers assist     Chair/bed transfer assist level: Supervision/Verbal cueing Chair/bed transfer assistive device: Other (rollator)   Locomotion Ambulation   Ambulation assist      Assist level: Supervision/Verbal cueing Assistive device: Rollator Max distance: 264ft   Walk 10 feet activity   Assist     Assist level: Supervision/Verbal cueing Assistive device: Rollator   Walk 50 feet activity   Assist    Assist level: Minimal Assistance - Patient > 75% Assistive device: No Device    Walk 150 feet activity   Assist    Assist level: Minimal Assistance - Patient > 75% Assistive device: No Device    Walk 10 feet on uneven surface  activity   Assist     Assist level: Contact Guard/Touching assist Assistive device: Other (comment) (railing)   Wheelchair     Assist Is the patient using a wheelchair?: No Type  of Wheelchair: Manual    Wheelchair assist level: Dependent - Patient 0%      Wheelchair 50 feet with 2 turns activity    Assist        Assist Level: Dependent - Patient 0%   Wheelchair 150 feet activity     Assist      Assist Level: Dependent - Patient 0%   Blood pressure 112/64, pulse 62, temperature 98.4 F (36.9 C), resp. rate 17, height 5\' 8"  (1.727 m), weight 72 kg, SpO2 96 %.  Medical Problem List and Plan: 1. Functional deficits secondary to debility secondary to CAD             -patient may  shower             -ELOS/Goals: 3/19 ModI/Sup goals- anticipate d/c today    2.  Antithrombotics: -DVT/anticoagulation:  Pharmaceutical: Xarelto             -antiplatelet therapy: aspirin 81 mg daily   3. Pain Management: Tylenol as needed   4. Mood/Behavior/Sleep: LCSW to  evaluate and provide emotional support             -antipsychotic agents: n/a   5. Neuropsych/cognition: This patient is capable of making decisions on his own behalf with assistance for complex decisions.   6. Skin/Wound Care: Routine skin care checks   7. Fluids/Electrolytes/Nutrition: Routine Is and Os and follow-up chemistries   8: Hypertension: monitor TID and prn; on Lopressor 12.5 mg BID - normotensive, monitor Vitals:   08/17/22 1921 08/18/22 0515  BP: 99/60 112/64  Pulse: 78 62  Resp: 17 17  Temp: 98.2 F (36.8 C) 98.4 F (36.9 C)  SpO2: 100% 96%      9: Hyperlipidemia: continue statin   10: CAD s/p CABG on 08/04/22             -sternal precautions x 6 wks             -continue Lopressor 12.5 mg BID- HR controlled    11: History of PE on Xarelto- pt asking for alternatives due to cost , discussed that there are no DOACs that are generic at this time    12: Multiple prior spine surgeries.  Dr. Ellene Route (lumbar), hx L3-5 fusion with extension to L2-3 fusion in Oct 2023- no back pain or residual leg pain   13: History of Parkinson's disease: Continue Sinemet and Aricept     LOS: 8 days A FACE TO FACE EVALUATION WAS PERFORMED  Charlett Blake 08/18/2022, 7:29 AM

## 2022-08-19 ENCOUNTER — Encounter: Payer: Self-pay | Admitting: Physician Assistant

## 2022-08-19 ENCOUNTER — Ambulatory Visit (INDEPENDENT_AMBULATORY_CARE_PROVIDER_SITE_OTHER): Payer: Self-pay | Admitting: Physician Assistant

## 2022-08-19 ENCOUNTER — Ambulatory Visit
Admission: RE | Admit: 2022-08-19 | Discharge: 2022-08-19 | Disposition: A | Discharge status: Home / Self Care | Payer: Medicare Other | Source: Ambulatory Visit | Attending: Cardiothoracic Surgery | Admitting: Cardiothoracic Surgery

## 2022-08-19 VITALS — BP 109/72 | HR 71 | Resp 20 | Ht 68.0 in | Wt 158.0 lb

## 2022-08-19 DIAGNOSIS — Z951 Presence of aortocoronary bypass graft: Secondary | ICD-10-CM | POA: Diagnosis not present

## 2022-08-19 NOTE — Patient Instructions (Signed)
Continue to avoid any heavy lifting or strenuous use of your arms or shoulders for at least a total of three months from the time of surgery.  After three months, you may gradually increase how much you lift or otherwise use your arms or chest as tolerated, with limits based upon whether or not activities lead to the return of significant discomfort.      

## 2022-08-20 DIAGNOSIS — K5900 Constipation, unspecified: Secondary | ICD-10-CM | POA: Diagnosis not present

## 2022-08-20 DIAGNOSIS — Z7982 Long term (current) use of aspirin: Secondary | ICD-10-CM | POA: Diagnosis not present

## 2022-08-20 DIAGNOSIS — I2510 Atherosclerotic heart disease of native coronary artery without angina pectoris: Secondary | ICD-10-CM | POA: Diagnosis not present

## 2022-08-20 DIAGNOSIS — Z48812 Encounter for surgical aftercare following surgery on the circulatory system: Secondary | ICD-10-CM | POA: Diagnosis not present

## 2022-08-20 DIAGNOSIS — E785 Hyperlipidemia, unspecified: Secondary | ICD-10-CM | POA: Diagnosis not present

## 2022-08-20 DIAGNOSIS — I119 Hypertensive heart disease without heart failure: Secondary | ICD-10-CM | POA: Diagnosis not present

## 2022-08-20 DIAGNOSIS — Z86711 Personal history of pulmonary embolism: Secondary | ICD-10-CM | POA: Diagnosis not present

## 2022-08-20 DIAGNOSIS — Z7901 Long term (current) use of anticoagulants: Secondary | ICD-10-CM | POA: Diagnosis not present

## 2022-08-20 DIAGNOSIS — G20A1 Parkinson's disease without dyskinesia, without mention of fluctuations: Secondary | ICD-10-CM | POA: Diagnosis not present

## 2022-08-21 DIAGNOSIS — G20A1 Parkinson's disease without dyskinesia, without mention of fluctuations: Secondary | ICD-10-CM | POA: Diagnosis not present

## 2022-08-21 DIAGNOSIS — Z7901 Long term (current) use of anticoagulants: Secondary | ICD-10-CM | POA: Diagnosis not present

## 2022-08-21 DIAGNOSIS — I119 Hypertensive heart disease without heart failure: Secondary | ICD-10-CM | POA: Diagnosis not present

## 2022-08-21 DIAGNOSIS — Z86711 Personal history of pulmonary embolism: Secondary | ICD-10-CM | POA: Diagnosis not present

## 2022-08-21 DIAGNOSIS — Z48812 Encounter for surgical aftercare following surgery on the circulatory system: Secondary | ICD-10-CM | POA: Diagnosis not present

## 2022-08-21 DIAGNOSIS — I2510 Atherosclerotic heart disease of native coronary artery without angina pectoris: Secondary | ICD-10-CM | POA: Diagnosis not present

## 2022-08-24 ENCOUNTER — Ambulatory Visit: Payer: Medicare Other | Attending: Nurse Practitioner | Admitting: Nurse Practitioner

## 2022-08-24 ENCOUNTER — Encounter: Payer: Self-pay | Admitting: Nurse Practitioner

## 2022-08-24 VITALS — BP 117/69 | HR 67 | Ht 68.0 in | Wt 159.0 lb

## 2022-08-24 DIAGNOSIS — E785 Hyperlipidemia, unspecified: Secondary | ICD-10-CM | POA: Insufficient documentation

## 2022-08-24 DIAGNOSIS — G20A1 Parkinson's disease without dyskinesia, without mention of fluctuations: Secondary | ICD-10-CM | POA: Diagnosis not present

## 2022-08-24 DIAGNOSIS — Z01810 Encounter for preprocedural cardiovascular examination: Secondary | ICD-10-CM | POA: Diagnosis not present

## 2022-08-24 DIAGNOSIS — I119 Hypertensive heart disease without heart failure: Secondary | ICD-10-CM | POA: Diagnosis not present

## 2022-08-24 DIAGNOSIS — Z48812 Encounter for surgical aftercare following surgery on the circulatory system: Secondary | ICD-10-CM | POA: Diagnosis not present

## 2022-08-24 DIAGNOSIS — I10 Essential (primary) hypertension: Secondary | ICD-10-CM | POA: Diagnosis not present

## 2022-08-24 DIAGNOSIS — Z86711 Personal history of pulmonary embolism: Secondary | ICD-10-CM | POA: Insufficient documentation

## 2022-08-24 DIAGNOSIS — I2510 Atherosclerotic heart disease of native coronary artery without angina pectoris: Secondary | ICD-10-CM | POA: Diagnosis not present

## 2022-08-24 DIAGNOSIS — Z7901 Long term (current) use of anticoagulants: Secondary | ICD-10-CM | POA: Diagnosis not present

## 2022-08-24 NOTE — Progress Notes (Signed)
Office Visit    Patient Name: Terry [name] Date of Encounter: 08/24/2022  Primary Care Provider:  Shon Baton, MD Primary Cardiologist:  Quay Burow, MD  Chief Complaint    72 year old male with a history of CAD s/p CABG x 3 (LIMA-LAD, SVG-Diag, SVG-PDA) in 07/2022, PE on Xarelto, Parkinson's disease, hypertension, hyperlipidemia, prostate cancer, arthritis, and GERD who presents for hospital follow-up related to CAD s/p CABG x 3.  Past Medical History    Past Medical History:  Diagnosis Date   Arthritis    neck, back, knees, right ankle   Bladder calculus    Chronic back pain    Coronary artery disease    GERD (gastroesophageal reflux disease)    History of colon polyps    benign   History of kidney stones    and bladder stone   History of prostate cancer urologist-- dr Alinda Money   dx 2014---  s/p  prostatectomy 05-22-2013 ,  Gleason 3+3   Hypercholesterolemia    takes Crestor daily   Hyperlipidemia    Pneumonia 10/2019   Pulmonary emboli ([place]) 10/2019   Wears glasses    Past Surgical History:  Procedure Laterality Date   ACHILLES TENDON SURGERY Right 06-07-2018   @[facility]    reconstruction   ANTERIOR CERVICAL DECOMP/DISCECTOMY FUSION N/A 06/27/2014   Procedure: ANTERIOR CERVICAL DECOMPRESSION/DISCECTOMY FUSION C5-C7   (2 LEVELS);  Surgeon: Melina Schools, MD;  Location: [place];  Service: Orthopedics;  Laterality: N/A;   back fusion  03/2022   CARDIAC CATHETERIZATION  2004   CLIPPING OF ATRIAL APPENDAGE Left 08/04/2022   Procedure: CLIPPING OF ATRIAL APPENDAGE USING AN ATRICURE 44 ATRICLIP;  Surgeon: Neomia Glass, MD;  Location: [place];  Service: Open Heart Surgery;  Laterality: Left;   COLONOSCOPY     CORONARY ARTERY BYPASS GRAFT N/A 08/04/2022   Procedure: CORONARY ARTERY BYPASS GRAFTING (CABG) X THREE USING LEFT INTERNAL MAMMARY ARTERY (LIMA) AND ENDOSCOPICALLY HARVESTED RIGHT GREATER SAPHENOUS VEIN.;  Surgeon: Neomia Glass, MD;  Location: [place];  Service: Open Heart  Surgery;  Laterality: N/A;   CYSTOSCOPY WITH LITHOLAPAXY N/A 03/27/2019   Procedure: CYSTOSCOPY WITH REMOVAL OF BLADDER STONE AND FOREIGN BODY;  Surgeon: Raynelle Bring, MD;  Location: [facility];  Service: Urology;  Laterality: N/A;   EYE SURGERY     lazy eye as child, lasik surgery   FINGER SURGERY Right    4th finger   I & D EXTREMITY Right 07/10/2018   Procedure: IRRIGATION AND DEBRIDEMENT RIGHT ANKLE WOUND AND WOUND VAC PLACEMENT;  Surgeon: Wylene Simmer, MD;  Location: [place];  Service: Orthopedics;  Laterality: Right;   KNEE ARTHROSCOPY Bilateral right ?;  left 05-25-2007  @[facility]    LEFT HEART CATH AND CORONARY ANGIOGRAPHY N/A 07/20/2022   Procedure: LEFT HEART CATH AND CORONARY ANGIOGRAPHY;  Surgeon: Lorretta Harp, MD;  Location: [facility];  Service: Cardiovascular;  Laterality: N/A;   POSTERIOR LAMINECTOMY / DECOMPRESSION LUMBAR SPINE  08-31-2016    dr elsner  @[facility]    PROSTATE BIOPSY  04/13/2013   gleason 3+3=6, vol 55.4 cc   ROBOT ASSISTED LAPAROSCOPIC RADICAL PROSTATECTOMY N/A 05/22/2013   Procedure: ROBOTIC ASSISTED LAPAROSCOPIC RADICAL PROSTATECTOMY LEVEL 1;  Surgeon: Dutch Gray, MD;  Location: [facility];  Service: Urology;  Laterality: N/A;   TEE WITHOUT CARDIOVERSION N/A 08/04/2022   Procedure: TRANSESOPHAGEAL ECHOCARDIOGRAM;  Surgeon: Neomia Glass, MD;  Location: [place];  Service: Open Heart Surgery;  Laterality: N/A;   TONSILLECTOMY  Allergies  Allergies  Allergen Reactions   Lipitor [Atorvastatin] Other (See Comments)    Muscle issues   Penicillins Hives, Itching and Other (See Comments)    Has patient had a PCN reaction causing immediate rash, facial/tongue/throat swelling, SOB or lightheadedness with hypotension: No Has patient had a PCN reaction causing SEVERE RASH INVOLVING MUCUS MEMBRANES or SKIN NECROSIS: #  #  #  YES  #  #  #  Has patient had a PCN reaction that required hospitalization No Has patient had a PCN reaction occurring within  the last 10 years: No      Labs/Other Studies Reviewed    The following studies were reviewed today: [facility] Aug 11, 2022:    Prox LAD to Mid LAD lesion is 95% stenosed.   Dist RCA lesion is 95% stenosed.   Prox RCA-1 lesion is 90% stenosed.   Prox RCA-2 lesion is 60% stenosed.   The left ventricular systolic function is normal.   LV end diastolic pressure is low.   The left ventricular ejection fraction is 50-55% by visual estimate.  Echo 2022/08/11: IMPRESSIONS    1. Left ventricular ejection fraction, by estimation, is 60 to 65%. The  left ventricle has normal function. The left ventricle has no regional  wall motion abnormalities. Left ventricular diastolic parameters are  consistent with Grade I diastolic  dysfunction (impaired relaxation).   2. Right ventricular systolic function is normal. The right ventricular  size is normal.   3. The mitral valve is normal in structure. No evidence of mitral valve  regurgitation. No evidence of mitral stenosis.   4. The aortic valve is tricuspid. There is moderate calcification of the  aortic valve. Aortic valve regurgitation is not visualized. No aortic  stenosis is present.   5. The inferior vena cava is normal in size with greater than 50%  respiratory variability, suggesting right atrial pressure of 3 mmHg.    Recent Labs: 09/29/2021: TSH 2.520 08/05/2022: Magnesium 2.1 08/11/2022: ALT 28; Hemoglobin 11.1; Platelets 327 08/17/2022: BUN 12; Creatinine, Ser 0.93; Potassium 4.0; Sodium 138  Recent Lipid Panel No results found for: "CHOL", "TRIG", "HDL", "CHOLHDL", "VLDL", "LDLCALC", "LDLDIRECT"  History of Present Illness    72 year old male with the above past medical history including CAD s/p CABG x 3 (LIMA - LAD, SVG - Diag, SVG - PDA) in 07/2022, PE on Xarelto, Parkinson's disease, hypertension, hyperlipidemia, prostate cancer, arthritis, and GERD.  He has a history of PE on chronic Xarelto.  Coronary CTA in 2022-08-11 revealed coronary calcium  score 440, with significant disease in RCA by FFR analysis.  Echocardiogram at the time showed EF 60 to 65%, normal LV function, G1 DD, normal RV function, no significant valvular abnormalities. He underwent cardiac catheterization in 11-Aug-2022 which revealed severe multivessel CAD. He was referred to CT surgery and underwent CABG x 3 (LIMA-LAD, SVG-diagonal, SVG-PDA) on 08/04/2022.  Postop course was complicated by hypotension mild fluid volume overload, and tachycardia as well as physical deconditioning.  He was started on metoprolol and Lasix.  He was discharged to CIR and subsequently home.  He saw CT surgery on 08/19/2022 and was stable from a cardiac standpoint.  He presents today for follow-up accompanied by his wife.  Since his hospitalization he has been stable from a cardiac standpoint.  He does have a productive cough-he thinks he may have bronchitis.  He plans to follow-up with his PCP for this.  He denies any chest pain, dyspnea, edema, PND, orthopnea, weight gain.  He is  increasing his activity as tolerated.  Overall, he reports feeling well.  Home Medications    Current Outpatient Medications  Medication Sig Dispense Refill   acetaminophen (TYLENOL) 325 MG tablet Take 1-2 tablets (325-650 mg total) by mouth every 4 (four) hours as needed for mild pain.     aspirin EC 81 MG tablet Take 1 tablet (81 mg total) by mouth daily. Swallow whole.     carbidopa-levodopa (SINEMET IR) 25-100 MG tablet Take 1 tablet by mouth 3 (three) times daily. 270 tablet 3   cetirizine (ZYRTEC) 10 MG tablet Take 10 mg by mouth daily.     cyanocobalamin (VITAMIN B12) 1000 MCG tablet Take 1,000 mcg by mouth daily.     donepezil (ARICEPT) 10 MG tablet Take 1 tablet (10 mg total) by mouth at bedtime. 90 tablet 3   gabapentin (NEURONTIN) 300 MG capsule Take 300 mg by mouth at bedtime as needed (pain).     MELATONIN PO Take 2 tablets by mouth at bedtime.     metoprolol tartrate (LOPRESSOR) 25 MG tablet Take 0.5 tablets  (12.5 mg total) by mouth 2 (two) times daily. 15 tablet 0   omeprazole (PRILOSEC OTC) 20 MG tablet Take 20 mg by mouth in the morning.     Polyvinyl Alcohol-Povidone PF (REFRESH) 1.4-0.6 % SOLN Place 1 drop into both eyes daily as needed (luberication and dry eyes).     rivaroxaban (XARELTO) 20 MG TABS tablet Take 20 mg by mouth daily with supper.     rosuvastatin (CRESTOR) 5 MG tablet Take 1 tablet (5 mg total) by mouth daily. 90 tablet 3   No current facility-administered medications for this visit.     Review of Systems    He denies chest pain, palpitations, dyspnea, pnd, orthopnea, n, v, dizziness, syncope, edema, weight gain, or early satiety. All other systems reviewed and are otherwise negative except as noted above.   Physical Exam    VS:  BP 117/69 (BP Location: Left Arm, Patient Position: Sitting, Cuff Size: Normal)   Pulse 67   Ht 5\' 8"  (1.727 m)   Wt 159 lb (72.1 kg)   BMI 24.18 kg/m   GEN: Well nourished, well developed, in no acute distress. HEENT: normal. Neck: Supple, no JVD, carotid bruits, or masses. Cardiac: RRR, no murmurs, rubs, or gallops. No clubbing, cyanosis, edema.  Radials/DP/PT 2+ and equal bilaterally.  Respiratory:  Respirations regular and unlabored, clear to auscultation bilaterally. GI: Soft, nontender, nondistended, BS + x 4. MS: no deformity or atrophy. Skin: warm and dry, no rash.  Midsternal incision clean, dry, intact, well-approximated. Neuro:  Strength and sensation are intact. Psych: Normal affect.  Accessory Clinical Findings    ECG personally reviewed by me today -NSR, 67 bpm- no acute changes.   Lab Results  Component Value Date   WBC 9.4 08/11/2022   HGB 11.1 (L) 08/11/2022   HCT 32.7 (L) 08/11/2022   MCV 86.5 08/11/2022   PLT 327 08/11/2022   Lab Results  Component Value Date   CREATININE 0.93 08/17/2022   BUN 12 08/17/2022   NA 138 08/17/2022   K 4.0 08/17/2022   CL 106 08/17/2022   CO2 23 08/17/2022   Lab Results   Component Value Date   ALT 28 08/11/2022   AST 25 08/11/2022   ALKPHOS 51 08/11/2022   BILITOT 0.8 08/11/2022   No results found for: "CHOL", "HDL", "LDLCALC", "LDLDIRECT", "TRIG", "CHOLHDL"  Lab Results  Component Value Date   HGBA1C 6.2 (H)  07/31/2022    Assessment & Plan    1. CAD: S/p CABG x 3 (LIMA-LAD, SVG-diagonal, SVG-PDA) on 08/04/2022. Stable with no anginal symptoms.  Continue aspirin, metoprolol, Crestor.  2. History of PE: On Xarelto. Managed per PCP.   3. Hypertension: BP well controlled. Continue current antihypertensive regimen.   4. Hyperlipidemia: LDL was 102 in 04/2022.  Has a history of statin intolerance.  He is only taking 5 mg of Crestor.  Will refer to lipid clinic Pharm.D. to discuss alternative lipid lowering therapies.  5. Preop cardiac exam: Patient states he is pending possible retina surgery.  Xarelto is managed per PCP.  Would recommend continuation of aspirin throughout the perioperative period if possible given recent CABG.  Otherwise, should we receive a cardiac clearance request, he should be okay to proceed as he is stable with no new symptoms, able to complete greater than 4 METS without difficulty.  6. Disposition: Follow-up in 3 to 4 months with APP, 6 to 7 months with Dr. Gwenlyn Found.     Lenna Sciara, NP 08/24/2022, 10:37 AM

## 2022-08-24 NOTE — Patient Instructions (Signed)
Medication Instructions:  Your physician recommends that you continue on your current medications as directed. Please refer to the Current Medication list given to you today.   *If you need a refill on your cardiac medications before your next appointment, please call your pharmacy*   Lab Work: None ordered  If you have labs (blood work) drawn today and your tests are completely normal, you will receive your results only by: [place] (if you have MyChart) OR A paper copy in the mail If you have any lab test that is abnormal or we need to change your treatment, we will call you to review the results.   Testing/Procedures: None ordered  Follow-Up: At [facility], you and your health needs are our priority.  As part of our continuing mission to provide you with exceptional heart care, we have created designated Provider Care Teams.  These Care Teams include your primary Cardiologist (physician) and Advanced Practice Providers (APPs -  Physician Assistants and Nurse Practitioners) who all work together to provide you with the care you need, when you need it.  We recommend signing up for the patient portal called "MyChart".  Sign up information is provided on this After Visit Summary.  MyChart is used to connect with patients for Virtual Visits (Telemedicine).  Patients are able to view lab/test results, encounter notes, upcoming appointments, etc.  Non-urgent messages can be sent to your provider as well.   To learn more about what you can do with MyChart, go to [email or web address].    Your next appointment:   3 month(s) Diona Browner, NP 6-7 Months Gwenlyn Found, MD  Provider:   Diona Browner, NP    Then, Quay Burow, MD will plan to see you again in 6 month(s).    Other Instructions Referral sent to Pharm D for Lipids.

## 2022-08-26 DIAGNOSIS — Z7901 Long term (current) use of anticoagulants: Secondary | ICD-10-CM | POA: Diagnosis not present

## 2022-08-26 DIAGNOSIS — G20A1 Parkinson's disease without dyskinesia, without mention of fluctuations: Secondary | ICD-10-CM | POA: Diagnosis not present

## 2022-08-26 DIAGNOSIS — Z86711 Personal history of pulmonary embolism: Secondary | ICD-10-CM | POA: Diagnosis not present

## 2022-08-26 DIAGNOSIS — Z48812 Encounter for surgical aftercare following surgery on the circulatory system: Secondary | ICD-10-CM | POA: Diagnosis not present

## 2022-08-26 DIAGNOSIS — I119 Hypertensive heart disease without heart failure: Secondary | ICD-10-CM | POA: Diagnosis not present

## 2022-08-26 DIAGNOSIS — I2510 Atherosclerotic heart disease of native coronary artery without angina pectoris: Secondary | ICD-10-CM | POA: Diagnosis not present

## 2022-08-27 DIAGNOSIS — Z1152 Encounter for screening for COVID-19: Secondary | ICD-10-CM | POA: Diagnosis not present

## 2022-08-27 DIAGNOSIS — R5383 Other fatigue: Secondary | ICD-10-CM | POA: Diagnosis not present

## 2022-08-27 DIAGNOSIS — R0981 Nasal congestion: Secondary | ICD-10-CM | POA: Diagnosis not present

## 2022-08-27 DIAGNOSIS — J069 Acute upper respiratory infection, unspecified: Secondary | ICD-10-CM | POA: Diagnosis not present

## 2022-08-27 DIAGNOSIS — R059 Cough, unspecified: Secondary | ICD-10-CM | POA: Diagnosis not present

## 2022-08-30 NOTE — Progress Notes (Unsigned)
      [address]       [place] [address]             [phone]                    Morgen L Guido [place] Medical Record [id number] Date of Birth: 1950-11-03  Referring: Shon Baton, MD Primary Care: Shon Baton, MD Primary Cardiologist: Quay Burow, MD  On 08/04/22 s/ p  OPERATION: CABG x 3 (LIMA - LAD, SVG - Diag, SVG - PDA) Endoscopic saphenous vein harvest Placement of 56mm Atriclip on left atrial appendage    PHYSICAL EXAMINATION: There were no vitals taken for this visit.  Gen: NAD Neuro: Alert and oriented Chest: incision healing well Resp: Nonlaboured Abd: Soft, ntnd Extr: WWP, 1+ LE edema bilateral  Diagnostic Studies & Laboratory data:    XR today:none   Assessment / Plan:   S/P CAB on 3/5  Incisions healing well Is on eliquis / ASA He needs to be on ASA no matter what for the next year.  Eliquis is more about his history of PE - that can be managed by PCP Plan to see back PRN   Pierre Bali Kennidy Lamke 08/30/2022 5:15 PM

## 2022-09-03 ENCOUNTER — Ambulatory Visit (INDEPENDENT_AMBULATORY_CARE_PROVIDER_SITE_OTHER): Payer: Self-pay | Admitting: Cardiothoracic Surgery

## 2022-09-03 VITALS — BP 115/70 | HR 78 | Resp 20 | Ht 68.0 in | Wt 158.0 lb

## 2022-09-03 DIAGNOSIS — I2510 Atherosclerotic heart disease of native coronary artery without angina pectoris: Secondary | ICD-10-CM

## 2022-09-03 DIAGNOSIS — Z951 Presence of aortocoronary bypass graft: Secondary | ICD-10-CM

## 2022-09-04 ENCOUNTER — Telehealth: Payer: Self-pay

## 2022-09-04 NOTE — Telephone Encounter (Signed)
Request for cardiac rehabilitation received. Will have Dr. Allyson Sabal review and sign. Will fax back to [facility].

## 2022-09-16 NOTE — Telephone Encounter (Signed)
Jasmine called today from [facility] to check on pt's referral form from Dr. Allyson Sabal. Explained that Dr. Allyson Sabal was out of the office for about 3 weeks and I was unable to get this signed until yesterday.   Referral faxed back to [facility]. Pt is slated to start on Tuesday 4/23.

## 2022-09-22 DIAGNOSIS — Z951 Presence of aortocoronary bypass graft: Secondary | ICD-10-CM | POA: Diagnosis not present

## 2022-09-23 DIAGNOSIS — Z951 Presence of aortocoronary bypass graft: Secondary | ICD-10-CM | POA: Diagnosis not present

## 2022-09-24 DIAGNOSIS — Z951 Presence of aortocoronary bypass graft: Secondary | ICD-10-CM | POA: Diagnosis not present

## 2022-09-25 ENCOUNTER — Telehealth: Payer: Self-pay | Admitting: *Deleted

## 2022-09-25 NOTE — Telephone Encounter (Signed)
   Pre-operative Risk Assessment    Patient Name: Terry [name]  DOB: 1950-10-14 MRN: [id number]      Request for Surgical Clearance    Procedure:   RETINAL SURGERY IN THE RIGHT EYE  Date of Surgery:  Clearance TBD                                 Surgeon:  DR. Stephannie Li Surgeon's Group or Practice Name:  [facility] Phone number:  [phone] Fax number:  [phone]   Type of Clearance Requested:   - Medical  - Pharmacy:  Hold Aspirin and Rivaroxaban (Xarelto) NOT INDICATED HOW LONG   Type of Anesthesia:  MAC   Additional requests/questions:    Terry [name]   09/25/2022, 7:15 AM

## 2022-09-25 NOTE — Telephone Encounter (Signed)
   Patient Name: Terry [name]  DOB: Mar 14, 1951 MRN: [id number]  Primary Cardiologist: Nanetta Batty, MD  Chart reviewed as part of pre-operative protocol coverage. Given past medical history and time since last visit, based on ACC/AHA guidelines, Terry [name] is at acceptable risk for the planned procedure without further cardiovascular testing.   Xarelto is managed per PCP.  Would recommend continuation of aspirin throughout the perioperative period if possible given recent CABG.    I will route this recommendation to the requesting party via Epic fax function and remove from pre-op pool.  Please call with questions.  Joylene Grapes, NP 09/25/2022, 1:07 PM

## 2022-09-28 DIAGNOSIS — Z951 Presence of aortocoronary bypass graft: Secondary | ICD-10-CM | POA: Diagnosis not present

## 2022-09-29 ENCOUNTER — Encounter: Payer: Self-pay | Admitting: Pharmacist

## 2022-09-29 ENCOUNTER — Other Ambulatory Visit (HOSPITAL_COMMUNITY): Payer: Self-pay

## 2022-09-29 ENCOUNTER — Ambulatory Visit: Payer: BLUE CROSS/BLUE SHIELD | Attending: Cardiovascular Disease | Admitting: Pharmacist

## 2022-09-29 ENCOUNTER — Telehealth: Payer: Self-pay

## 2022-09-29 DIAGNOSIS — I2510 Atherosclerotic heart disease of native coronary artery without angina pectoris: Secondary | ICD-10-CM

## 2022-09-29 DIAGNOSIS — C61 Malignant neoplasm of prostate: Secondary | ICD-10-CM | POA: Insufficient documentation

## 2022-09-29 DIAGNOSIS — E785 Hyperlipidemia, unspecified: Secondary | ICD-10-CM

## 2022-09-29 DIAGNOSIS — I700 Atherosclerosis of aorta: Secondary | ICD-10-CM

## 2022-09-29 NOTE — Progress Notes (Unsigned)
Patient ID: [name]                 DOB: [birth year]                    MRN: [id number]     HPI: Terry [name] is a 72 y.o. male patient referred to lipid clinic by Terry Person NP. Patient of Dr Terry [name]. PMH is significant for CABG, PE, CAD, prostate cancer, and statin myalgia. Had CABG x 3 on 08/04/22.  Patient presents today in good spirits. Feels well since surgery and has started rehab in [facility]. Currently managed on rosuvastatin 5mg  once weekly because more frequent administration causes myalgias.  Has been eating more salads and yogurts recently but reports he was a fairly healthy eater before his hospitalization.   Current Medications:  Rosuvastatin 5mg  once weekly  Risk Factors:  CAD CABG  LDL goal: <70  Labs: TC 213, LDL 102, HDL 34, Trigs 391 (04/08/22)  Past Medical History:  Diagnosis Date   Arthritis    neck, back, knees, right ankle   Bladder calculus    Chronic back pain    Coronary artery disease    GERD (gastroesophageal reflux disease)    History of colon polyps    benign   History of kidney stones    and bladder stone   History of prostate cancer urologist-- dr Terry [name]   dx 2014---  s/p  prostatectomy 05-22-2013 ,  Gleason 3+3   Hypercholesterolemia    takes Crestor daily   Hyperlipidemia    Pneumonia 10/2019   Pulmonary emboli (HCC) 10/2019   Wears glasses     Current Outpatient Medications on File Prior to Visit  Medication Sig Dispense Refill   acetaminophen (TYLENOL) 325 MG tablet Take 1-2 tablets (325-650 mg total) by mouth every 4 (four) hours as needed for mild pain.     aspirin EC 81 MG tablet Take 1 tablet (81 mg total) by mouth daily. Swallow whole.     carbidopa-levodopa (SINEMET IR) 25-100 MG tablet Take 1 tablet by mouth 3 (three) times daily. 270 tablet 3   cetirizine (ZYRTEC) 10 MG tablet Take 10 mg by mouth daily.     cyanocobalamin (VITAMIN B12) 1000 MCG tablet Take 1,000 mcg by mouth daily.     donepezil (ARICEPT) 10 MG  tablet Take 1 tablet (10 mg total) by mouth at bedtime. 90 tablet 3   gabapentin (NEURONTIN) 300 MG capsule Take 300 mg by mouth at bedtime as needed (pain).     MELATONIN PO Take 2 tablets by mouth at bedtime.     metoprolol tartrate (LOPRESSOR) 25 MG tablet Take 0.5 tablets (12.5 mg total) by mouth 2 (two) times daily. 15 tablet 0   omeprazole (PRILOSEC OTC) 20 MG tablet Take 20 mg by mouth in the morning.     Polyvinyl Alcohol-Povidone PF (REFRESH) 1.4-0.6 % SOLN Place 1 drop into both eyes daily as needed (luberication and dry eyes).     rivaroxaban (XARELTO) 20 MG TABS tablet Take 20 mg by mouth daily with supper.     rosuvastatin (CRESTOR) 5 MG tablet Take 1 tablet (5 mg total) by mouth daily. 90 tablet 3   No current facility-administered medications on file prior to visit.    Allergies  Allergen Reactions   Lipitor [Atorvastatin] Other (See Comments)    Muscle issues   Penicillins Hives, Itching and Other (See Comments)    Has patient had a PCN reaction causing  immediate rash, facial/tongue/throat swelling, SOB or lightheadedness with hypotension: No Has patient had a PCN reaction causing SEVERE RASH INVOLVING MUCUS MEMBRANES or SKIN NECROSIS: #  #  #  YES  #  #  #  Has patient had a PCN reaction that required hospitalization No Has patient had a PCN reaction occurring within the last 10 years: No     Assessment/Plan:  1. Hyperlipidemia - Patient LDL 102 which is above goal of <70. Recommend starting PCSK9i since he is intolerant to statins.  Using demo pen, educated patient on mechanism of action, storage, site selection, and administration. Patient voiced understanding. Will complete PA and contact patient when approved. Recheck lipid panel in 2-3 months.  Terry [name], PharmD, BCACP, CDCES, CPP [address] [place], [place], [address] Phone: [phone], Fax: [phone]

## 2022-09-29 NOTE — Patient Instructions (Addendum)
It was nice meeting you today  We would like your LDL (bad cholesterol) to be less than 70  Please continue your rosuvastatin 5mg  once daily  We would like to start you on a new medication called Repatha which you would inject once every 2 weeks  Once you start the medication we will recheck your cholesterol in about 2-3 months  Please let us know if you have any questions  Laural Golden, PharmD, BCACP, CDCES, CPP [address] [place], [place], [address] Phone: [phone], Fax: [phone]

## 2022-09-29 NOTE — Telephone Encounter (Signed)
Pharmacy Patient Advocate Encounter  Prior Authorization for Praluent 75mg /ml has been approved by [facility] (ins).    KEY # [id number]  Effective dates: 4.16.24 UNTIL FURTHER NOTICE

## 2022-09-29 NOTE — Telephone Encounter (Signed)
Pharmacy Patient Advocate Encounter   Received notification from PharmD that prior authorization for Praluent 75mg /ml is required/requested.   PA submitted on 4.30.24 to (ins) [employer] via CoverMyMeds  Key or (Medicaid) confirmation # [id number]   Status is pending

## 2022-09-30 DIAGNOSIS — Z951 Presence of aortocoronary bypass graft: Secondary | ICD-10-CM | POA: Diagnosis not present

## 2022-09-30 MED ORDER — PRALUENT 75 MG/ML ~~LOC~~ SOAJ: 75.0000 mg | SUBCUTANEOUS | 11 refills | Status: DC

## 2022-09-30 NOTE — Telephone Encounter (Signed)
Called patient and lmom of approval

## 2022-10-01 ENCOUNTER — Other Ambulatory Visit: Payer: Self-pay | Admitting: Cardiovascular Disease

## 2022-10-01 DIAGNOSIS — I700 Atherosclerosis of aorta: Secondary | ICD-10-CM

## 2022-10-01 DIAGNOSIS — I2510 Atherosclerotic heart disease of native coronary artery without angina pectoris: Secondary | ICD-10-CM

## 2022-10-01 DIAGNOSIS — E785 Hyperlipidemia, unspecified: Secondary | ICD-10-CM

## 2022-10-01 DIAGNOSIS — Z951 Presence of aortocoronary bypass graft: Secondary | ICD-10-CM | POA: Diagnosis not present

## 2022-10-05 ENCOUNTER — Other Ambulatory Visit: Payer: Self-pay | Admitting: Pharmacist

## 2022-10-05 ENCOUNTER — Telehealth: Payer: Self-pay | Admitting: Pharmacist

## 2022-10-05 DIAGNOSIS — Z951 Presence of aortocoronary bypass graft: Secondary | ICD-10-CM | POA: Diagnosis not present

## 2022-10-05 NOTE — Telephone Encounter (Signed)
Received notification that Praluent was going to cost >$400. Patient cannot afford. Spoke with patient, regardless if that is a destructible or not, patient cannot afford. Spoke with patient about Leqvio. He has medicare supplement, therefore would be no cost to patient. Advised that CVRR data has not been published. Patient would like to proceed with Leqvio. Form submitted to service center and referral sent to the [facility] market infusion center.

## 2022-10-07 ENCOUNTER — Telehealth: Payer: Self-pay | Admitting: Pharmacy Technician

## 2022-10-07 DIAGNOSIS — Z951 Presence of aortocoronary bypass graft: Secondary | ICD-10-CM | POA: Diagnosis not present

## 2022-10-07 NOTE — Telephone Encounter (Signed)
Auth Submission: NO AUTH NEEDED Site of care: Site of care: [facility] Payer: medicare a.b & supp Medication & CPT/J Code(s) submitted: Leqvio (Inclisiran) J1306 Route of submission (phone, fax, portal):  Phone # Fax # Auth type: Buy/Bill Units/visits requested: 2 doses Reference number: leqvio service biv Approval from: 10/07/22 to 06/01/23    Patient will be scheduled as soon as possible.  Medicare will cover 80% and supp will pick-up remaining 20%

## 2022-10-08 DIAGNOSIS — Z951 Presence of aortocoronary bypass graft: Secondary | ICD-10-CM | POA: Diagnosis not present

## 2022-10-09 DIAGNOSIS — H43822 Vitreomacular adhesion, left eye: Secondary | ICD-10-CM | POA: Diagnosis not present

## 2022-10-09 DIAGNOSIS — H43812 Vitreous degeneration, left eye: Secondary | ICD-10-CM | POA: Diagnosis not present

## 2022-10-09 DIAGNOSIS — H35371 Puckering of macula, right eye: Secondary | ICD-10-CM | POA: Diagnosis not present

## 2022-10-12 DIAGNOSIS — Z951 Presence of aortocoronary bypass graft: Secondary | ICD-10-CM | POA: Diagnosis not present

## 2022-10-14 DIAGNOSIS — Z951 Presence of aortocoronary bypass graft: Secondary | ICD-10-CM | POA: Diagnosis not present

## 2022-10-15 DIAGNOSIS — Z951 Presence of aortocoronary bypass graft: Secondary | ICD-10-CM | POA: Diagnosis not present

## 2022-10-16 ENCOUNTER — Ambulatory Visit (INDEPENDENT_AMBULATORY_CARE_PROVIDER_SITE_OTHER): Payer: Medicare Other

## 2022-10-16 VITALS — BP 102/64 | HR 57 | Temp 97.4°F | Resp 18 | Ht 67.0 in | Wt 160.0 lb

## 2022-10-16 DIAGNOSIS — E782 Mixed hyperlipidemia: Secondary | ICD-10-CM

## 2022-10-16 DIAGNOSIS — I2510 Atherosclerotic heart disease of native coronary artery without angina pectoris: Secondary | ICD-10-CM

## 2022-10-16 MED ORDER — INCLISIRAN SODIUM 284 MG/1.5ML ~~LOC~~ SOSY
284.0000 mg | PREFILLED_SYRINGE | Freq: Once | SUBCUTANEOUS | Status: AC
  Administered 2022-10-16: 284 mg via SUBCUTANEOUS
  Filled 2022-10-16: qty 1.5

## 2022-10-16 NOTE — Patient Instructions (Signed)
Inclisiran Injection What is this medication? INCLISIRAN (in kli SIR an) treats high cholesterol. It works by decreasing bad cholesterol (such as LDL) in your blood. Changes to diet and exercise are often combined with this medication. This medicine may be used for other purposes; ask your health care provider or pharmacist if you have questions. COMMON BRAND NAME(S): LEQVIO What should I tell my care team before I take this medication? They need to know if you have any of these conditions: An unusual or allergic reaction to inclisiran, other medications, foods, dyes, or preservatives Pregnant or trying to get pregnant Breast-feeding How should I use this medication? This medication is injected under the skin. It is given by your care team in a hospital or clinic setting. Talk to your care team about the use of this medication in children. Special care may be needed. Overdosage: If you think you have taken too much of this medicine contact a poison control center or emergency room at once. NOTE: This medicine is only for you. Do not share this medicine with others. What if I miss a dose? Keep appointments for follow-up doses. It is important not to miss your dose. Call your care team if you are unable to keep an appointment. What may interact with this medication? Interactions are not expected. This list may not describe all possible interactions. Give your health care provider a list of all the medicines, herbs, non-prescription drugs, or dietary supplements you use. Also tell them if you smoke, drink alcohol, or use illegal drugs. Some items may interact with your medicine. What should I watch for while using this medication? Visit your care team for regular checks on your progress. Tell your care team if your symptoms do not start to get better or if they get worse. You may need blood work while you are taking this medication. What side effects may I notice from receiving this  medication? Side effects that you should report to your care team as soon as possible: Allergic reactions--skin rash, itching, hives, swelling of the face, lips, tongue, or throat Side effects that usually do not require medical attention (report these to your care team if they continue or are bothersome): Joint pain Pain, redness, or irritation at injection site This list may not describe all possible side effects. Call your doctor for medical advice about side effects. You may report side effects to FDA at 1-800-FDA-1088. Where should I keep my medication? This medication is given in a hospital or clinic. It will not be stored at home. NOTE: This sheet is a summary. It may not cover all possible information. If you have questions about this medicine, talk to your doctor, pharmacist, or health care provider.  2023 Elsevier/Gold Standard (2020-06-05 00:00:00)  

## 2022-10-16 NOTE — Progress Notes (Signed)
Diagnosis: Hyperlipidemia  Provider:  Mannam, Praveen MD  Procedure: Injection  Leqvio (inclisiran), Dose: 284 mg, Site: subcutaneous, Number of injections: 1  Post Care: Observation period completed  Discharge: Condition: Good, Destination: Home . AVS Provided  Performed by:  Ayodele Sangalang, RN       

## 2022-10-19 DIAGNOSIS — J069 Acute upper respiratory infection, unspecified: Secondary | ICD-10-CM | POA: Diagnosis not present

## 2022-10-19 DIAGNOSIS — G20A1 Parkinson's disease without dyskinesia, without mention of fluctuations: Secondary | ICD-10-CM | POA: Diagnosis not present

## 2022-10-19 DIAGNOSIS — R413 Other amnesia: Secondary | ICD-10-CM | POA: Diagnosis not present

## 2022-10-19 DIAGNOSIS — E785 Hyperlipidemia, unspecified: Secondary | ICD-10-CM | POA: Diagnosis not present

## 2022-10-19 DIAGNOSIS — Z951 Presence of aortocoronary bypass graft: Secondary | ICD-10-CM | POA: Diagnosis not present

## 2022-10-19 DIAGNOSIS — R030 Elevated blood-pressure reading, without diagnosis of hypertension: Secondary | ICD-10-CM | POA: Diagnosis not present

## 2022-10-19 DIAGNOSIS — I2510 Atherosclerotic heart disease of native coronary artery without angina pectoris: Secondary | ICD-10-CM | POA: Diagnosis not present

## 2022-10-19 DIAGNOSIS — Z86711 Personal history of pulmonary embolism: Secondary | ICD-10-CM | POA: Diagnosis not present

## 2022-10-19 DIAGNOSIS — C61 Malignant neoplasm of prostate: Secondary | ICD-10-CM | POA: Diagnosis not present

## 2022-10-19 DIAGNOSIS — D692 Other nonthrombocytopenic purpura: Secondary | ICD-10-CM | POA: Diagnosis not present

## 2022-10-19 DIAGNOSIS — I6990 Unspecified sequelae of unspecified cerebrovascular disease: Secondary | ICD-10-CM | POA: Diagnosis not present

## 2022-10-19 DIAGNOSIS — Z7901 Long term (current) use of anticoagulants: Secondary | ICD-10-CM | POA: Diagnosis not present

## 2022-10-21 DIAGNOSIS — Z951 Presence of aortocoronary bypass graft: Secondary | ICD-10-CM | POA: Diagnosis not present

## 2022-10-22 DIAGNOSIS — Z951 Presence of aortocoronary bypass graft: Secondary | ICD-10-CM | POA: Diagnosis not present

## 2022-10-28 DIAGNOSIS — Z951 Presence of aortocoronary bypass graft: Secondary | ICD-10-CM | POA: Diagnosis not present

## 2022-10-29 DIAGNOSIS — Z951 Presence of aortocoronary bypass graft: Secondary | ICD-10-CM | POA: Diagnosis not present

## 2022-11-02 DIAGNOSIS — Z951 Presence of aortocoronary bypass graft: Secondary | ICD-10-CM | POA: Diagnosis not present

## 2022-11-04 DIAGNOSIS — Z951 Presence of aortocoronary bypass graft: Secondary | ICD-10-CM | POA: Diagnosis not present

## 2022-11-05 DIAGNOSIS — Z951 Presence of aortocoronary bypass graft: Secondary | ICD-10-CM | POA: Diagnosis not present

## 2022-11-09 DIAGNOSIS — Z951 Presence of aortocoronary bypass graft: Secondary | ICD-10-CM | POA: Diagnosis not present

## 2022-11-11 DIAGNOSIS — Z951 Presence of aortocoronary bypass graft: Secondary | ICD-10-CM | POA: Diagnosis not present

## 2022-11-12 DIAGNOSIS — L720 Epidermal cyst: Secondary | ICD-10-CM | POA: Diagnosis not present

## 2022-11-12 DIAGNOSIS — Z85828 Personal history of other malignant neoplasm of skin: Secondary | ICD-10-CM | POA: Diagnosis not present

## 2022-11-12 DIAGNOSIS — D225 Melanocytic nevi of trunk: Secondary | ICD-10-CM | POA: Diagnosis not present

## 2022-11-12 DIAGNOSIS — L570 Actinic keratosis: Secondary | ICD-10-CM | POA: Diagnosis not present

## 2022-11-12 DIAGNOSIS — L821 Other seborrheic keratosis: Secondary | ICD-10-CM | POA: Diagnosis not present

## 2022-11-12 DIAGNOSIS — D692 Other nonthrombocytopenic purpura: Secondary | ICD-10-CM | POA: Diagnosis not present

## 2022-11-16 DIAGNOSIS — Z951 Presence of aortocoronary bypass graft: Secondary | ICD-10-CM | POA: Diagnosis not present

## 2022-11-18 DIAGNOSIS — Z951 Presence of aortocoronary bypass graft: Secondary | ICD-10-CM | POA: Diagnosis not present

## 2022-11-19 DIAGNOSIS — Z951 Presence of aortocoronary bypass graft: Secondary | ICD-10-CM | POA: Diagnosis not present

## 2022-11-23 DIAGNOSIS — Z951 Presence of aortocoronary bypass graft: Secondary | ICD-10-CM | POA: Diagnosis not present

## 2022-11-24 ENCOUNTER — Encounter: Payer: Self-pay | Admitting: Nurse Practitioner

## 2022-11-24 ENCOUNTER — Ambulatory Visit: Payer: Medicare Other | Attending: Nurse Practitioner | Admitting: Nurse Practitioner

## 2022-11-24 VITALS — BP 130/76 | HR 62 | Ht 67.5 in | Wt 163.2 lb

## 2022-11-24 DIAGNOSIS — I10 Essential (primary) hypertension: Secondary | ICD-10-CM | POA: Insufficient documentation

## 2022-11-24 DIAGNOSIS — I2510 Atherosclerotic heart disease of native coronary artery without angina pectoris: Secondary | ICD-10-CM | POA: Insufficient documentation

## 2022-11-24 DIAGNOSIS — Z86711 Personal history of pulmonary embolism: Secondary | ICD-10-CM | POA: Diagnosis not present

## 2022-11-24 DIAGNOSIS — E785 Hyperlipidemia, unspecified: Secondary | ICD-10-CM | POA: Insufficient documentation

## 2022-11-24 NOTE — Patient Instructions (Signed)
Medication Instructions:  Your physician recommends that you continue on your current medications as directed. Please refer to the Current Medication list given to you today.  *If you need a refill on your cardiac medications before your next appointment, please call your pharmacy*   Lab Work: NONE ordered at this time of appointment    Testing/Procedures: NONE ordered at this time of appointment    Follow-Up: At [facility], you and your health needs are our priority.  As part of our continuing mission to provide you with exceptional heart care, we have created designated Provider Care Teams.  These Care Teams include your primary Cardiologist (physician) and Advanced Practice Providers (APPs -  Physician Assistants and Nurse Practitioners) who all work together to provide you with the care you need, when you need it.  We recommend signing up for the patient portal called "MyChart".  Sign up information is provided on this After Visit Summary.  MyChart is used to connect with patients for Virtual Visits (Telemedicine).  Patients are able to view lab/test results, encounter notes, upcoming appointments, etc.  Non-urgent messages can be sent to your provider as well.   To learn more about what you can do with MyChart, go to [email or web address].    Your next appointment:    Keep Follow up   Provider:   Nanetta Batty, MD     Other Instructions [facility] [address] [place], [place] [address]. [phone]

## 2022-11-24 NOTE — Progress Notes (Addendum)
Office Visit    Patient Name: Terry [name] Date of Encounter: 11/24/2022  Primary Care Provider:  Creola Corn, MD Primary Cardiologist:  Nanetta Batty, MD  Chief Complaint    72 year old male with a history of CAD s/p CABG x 3 (LIMA-LAD, SVG-Diag, SVG-PDA) in 07/2022, PE on Xarelto, Parkinson's disease, hypertension, hyperlipidemia, prostate cancer, arthritis, and GERD who presents for follow-up related to CAD.   Past Medical History    Past Medical History:  Diagnosis Date   Arthritis    neck, back, knees, right ankle   Bladder calculus    Chronic back pain    Coronary artery disease    GERD (gastroesophageal reflux disease)    History of colon polyps    benign   History of kidney stones    and bladder stone   History of prostate cancer urologist-- dr Laverle Patter   dx 2014---  s/p  prostatectomy 05-22-2013 ,  Gleason 3+3   Hypercholesterolemia    takes Crestor daily   Hyperlipidemia    Pneumonia 10/2019   Pulmonary emboli (HCC) 10/2019   Wears glasses    Past Surgical History:  Procedure Laterality Date   ACHILLES TENDON SURGERY Right 06-07-2018   @[facility]    reconstruction   ANTERIOR CERVICAL DECOMP/DISCECTOMY FUSION N/A 06/27/2014   Procedure: ANTERIOR CERVICAL DECOMPRESSION/DISCECTOMY FUSION C5-C7   (2 LEVELS);  Surgeon: Venita Lick, MD;  Location: [facility] OR;  Service: Orthopedics;  Laterality: N/A;   back fusion  03/2022   CARDIAC CATHETERIZATION  2004   CLIPPING OF ATRIAL APPENDAGE Left 08/04/2022   Procedure: CLIPPING OF ATRIAL APPENDAGE USING AN ATRICURE 45 ATRICLIP;  Surgeon: Lyn Hollingshead, MD;  Location: [facility] OR;  Service: Open Heart Surgery;  Laterality: Left;   COLONOSCOPY     CORONARY ARTERY BYPASS GRAFT N/A 08/04/2022   Procedure: CORONARY ARTERY BYPASS GRAFTING (CABG) X THREE USING LEFT INTERNAL MAMMARY ARTERY (LIMA) AND ENDOSCOPICALLY HARVESTED RIGHT GREATER SAPHENOUS VEIN.;  Surgeon: Lyn Hollingshead, MD;  Location: [facility] OR;  Service: Open Heart Surgery;  Laterality:  N/A;   CYSTOSCOPY WITH LITHOLAPAXY N/A 03/27/2019   Procedure: CYSTOSCOPY WITH REMOVAL OF BLADDER STONE AND FOREIGN BODY;  Surgeon: Heloise Purpura, MD;  Location: [facility];  Service: Urology;  Laterality: N/A;   EYE SURGERY     lazy eye as child, lasik surgery   FINGER SURGERY Right    4th finger   I & D EXTREMITY Right 07/10/2018   Procedure: IRRIGATION AND DEBRIDEMENT RIGHT ANKLE WOUND AND WOUND VAC PLACEMENT;  Surgeon: Toni Arthurs, MD;  Location: [facility] OR;  Service: Orthopedics;  Laterality: Right;   KNEE ARTHROSCOPY Bilateral right ?;  left 05-25-2007  @[facility]    LEFT HEART CATH AND CORONARY ANGIOGRAPHY N/A 07/20/2022   Procedure: LEFT HEART CATH AND CORONARY ANGIOGRAPHY;  Surgeon: Runell Gess, MD;  Location: [facility];  Service: Cardiovascular;  Laterality: N/A;   POSTERIOR LAMINECTOMY / DECOMPRESSION LUMBAR SPINE  08-31-2016    dr elsner  @[facility]    PROSTATE BIOPSY  04/13/2013   gleason 3+3=6, vol 55.4 cc   ROBOT ASSISTED LAPAROSCOPIC RADICAL PROSTATECTOMY N/A 05/22/2013   Procedure: ROBOTIC ASSISTED LAPAROSCOPIC RADICAL PROSTATECTOMY LEVEL 1;  Surgeon: [name], MD;  Location: [facility];  Service: Urology;  Laterality: N/A;   TEE WITHOUT CARDIOVERSION N/A 08/04/2022   Procedure: TRANSESOPHAGEAL ECHOCARDIOGRAM;  Surgeon: Lyn Hollingshead, MD;  Location: [facility] OR;  Service: Open Heart Surgery;  Laterality: N/A;   TONSILLECTOMY  Allergies  Allergies  Allergen Reactions   Lipitor [Atorvastatin] Other (See Comments)    Muscle issues   Penicillins Hives, Itching and Other (See Comments)    Has patient had a PCN reaction causing immediate rash, facial/tongue/throat swelling, SOB or lightheadedness with hypotension: No Has patient had a PCN reaction causing SEVERE RASH INVOLVING MUCUS MEMBRANES or SKIN NECROSIS: #  #  #  YES  #  #  #  Has patient had a PCN reaction that required hospitalization No Has patient had a PCN reaction occurring within the last 10 years:  No      Labs/Other Studies Reviewed    The following studies were reviewed today:  Cardiac Studies & Procedures   CARDIAC CATHETERIZATION  CARDIAC CATHETERIZATION 07/20/2022  Narrative Images from the original result were not included.    Prox LAD to Mid LAD lesion is 95% stenosed.   Dist RCA lesion is 95% stenosed.   Prox RCA-1 lesion is 90% stenosed.   Prox RCA-2 lesion is 60% stenosed.   The left ventricular systolic function is normal.   LV end diastolic pressure is low.   The left ventricular ejection fraction is 50-55% by visual estimate.  Terry [name] is a 72 y.o. male   [id number] LOCATION:  FACILITY: [facility] PHYSICIAN: Nanetta Batty, M.D. [birth year]   DATE OF PROCEDURE:  07/20/2022  DATE OF DISCHARGE:     CARDIAC CATHETERIZATION    History obtained from chart review. Terry [name] is a 72 y.o.  thin and fit appearing married Caucasian male father of 2 children, grandfather of 2 grandchildren referred by Dr. Marina Goodell, gastroenterology, for preoperative clearance before his scheduled endoscopy.  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  I last saw him in the office 05/05/2022.  He has no cardiac risk factors other than family history with a father that died at age 35 of myocardial infarction.  He is never had a heart attack or stroke.  He did have a pulmonary embolism 5 years ago and has been on Xarelto since.  He was an active runner for a long time and has had multiple back surgeries most recently by Dr. Danielle Dess 6 weeks ago for which she stopped his Xarelto 2 days before.  He is very active but has complained of some exertional chest tightness.  Since I saw him 2 months ago I did get a coronary CTA on 07/15/2022 revealing a coronary calcium score of 440 with significant disease in his RCA by FFR analysis.  Based on this, we decided to proceed with outpatient radial diagnostic coronary angiography.  I have asked him to hold his Xarelto 2 days  before the procedure.  Impression Terry [name] has two-vessel disease.  He has a 95% proximal LAD stenosis just after a medium sized first diagonal branch.  He also has a 90% proximal RCA stenosis in the middle of a 60% segmental stenosis after the first band and a 95% stenosis distally at the crux after a segment of tortuosity.  He has a small PDA and a larger PLA with normal LV function.  He is not diabetic.  At this point, I am going to arrange for him to see cardiothoracic surgery to explore surgical revascularization options using all arterial conduit grafts.  The sheath was removed and a TR band was placed on the right wrist to achieve patent hemostasis.  The patient left lab in stable condition.  He will be discharged home later this morning  and arrangements will be made for outpatient surgical consult.  I told him to begin his Xarelto tomorrow.  Nanetta Batty. MD, [facility] 07/20/2022 8:24 AM  Findings Coronary Findings Diagnostic  Dominance: Right  Left Anterior Descending Prox LAD to Mid LAD lesion is 95% stenosed. The lesion is calcified.  Right Coronary Artery Prox RCA-1 lesion is 90% stenosed. Prox RCA-2 lesion is 60% stenosed. Dist RCA lesion is 95% stenosed.  Intervention  No interventions have been documented.     ECHOCARDIOGRAM  ECHOCARDIOGRAM COMPLETE 07/20/2022  Narrative ECHOCARDIOGRAM REPORT    Patient Name:   LACEY CHAUSSEE Date of Exam: 07/20/2022 Medical Rec #:  [id number]      Height:       67.5 in Accession #:    [id number]     Weight:       164.0 lb Date of Birth:  12-20-1950      BSA:          1.869 m Patient Age:    71 years       BP:           116/78 mmHg Patient Gender: M              HR:           76 bpm. Exam Location:  Inpatient  Procedure: 2D Echo, Color Doppler, Cardiac Doppler and Intracardiac Opacification Agent  Indications:    CAD Native Vessel i25.10  History:        Patient has prior history of Echocardiogram examinations, most recent  08/13/2020. CAD; Risk Factors:Hypertension and Dyslipidemia.  Sonographer:    Irving Burton Senior RDCS Referring Phys: [phone] Delton See BERRY   Sonographer Comments: Unable to reposition due to post cath restrictions. IMPRESSIONS   1. Left ventricular ejection fraction, by estimation, is 60 to 65%. The left ventricle has normal function. The left ventricle has no regional wall motion abnormalities. Left ventricular diastolic parameters are consistent with Grade I diastolic dysfunction (impaired relaxation). 2. Right ventricular systolic function is normal. The right ventricular size is normal. 3. The mitral valve is normal in structure. No evidence of mitral valve regurgitation. No evidence of mitral stenosis. 4. The aortic valve is tricuspid. There is moderate calcification of the aortic valve. Aortic valve regurgitation is not visualized. No aortic stenosis is present. 5. The inferior vena cava is normal in size with greater than 50% respiratory variability, suggesting right atrial pressure of 3 mmHg.  FINDINGS Left Ventricle: Left ventricular ejection fraction, by estimation, is 60 to 65%. The left ventricle has normal function. The left ventricle has no regional wall motion abnormalities. Definity contrast agent was given IV to delineate the left ventricular endocardial borders. The left ventricular internal cavity size was normal in size. There is no left ventricular hypertrophy. Left ventricular diastolic parameters are consistent with Grade I diastolic dysfunction (impaired relaxation).  Right Ventricle: The right ventricular size is normal. No increase in right ventricular wall thickness. Right ventricular systolic function is normal.  Left Atrium: Left atrial size was normal in size.  Right Atrium: Right atrial size was normal in size.  Pericardium: There is no evidence of pericardial effusion.  Mitral Valve: The mitral valve is normal in structure. Mild mitral annular calcification. No  evidence of mitral valve regurgitation. No evidence of mitral valve stenosis.  Tricuspid Valve: The tricuspid valve is normal in structure. Tricuspid valve regurgitation is trivial. No evidence of tricuspid stenosis.  Aortic Valve: The aortic valve is tricuspid. There is moderate  calcification of the aortic valve. Aortic valve regurgitation is not visualized. No aortic stenosis is present.  Pulmonic Valve: The pulmonic valve was normal in structure. Pulmonic valve regurgitation is not visualized. No evidence of pulmonic stenosis.  Aorta: The aortic root is normal in size and structure.  Venous: The inferior vena cava is normal in size with greater than 50% respiratory variability, suggesting right atrial pressure of 3 mmHg.  IAS/Shunts: No atrial level shunt detected by color flow Doppler.   LEFT VENTRICLE PLAX 2D LVIDd:         3.80 cm   Diastology LVIDs:         2.30 cm   LV e' medial:    5.66 cm/s LV PW:         0.70 cm   LV E/e' medial:  11.6 LV IVS:        0.90 cm   LV e' lateral:   7.94 cm/s LVOT diam:     1.90 cm   LV E/e' lateral: 8.2 LV SV:         49 LV SV Index:   26 LVOT Area:     2.84 cm   RIGHT VENTRICLE RV S prime:     12.90 cm/s TAPSE (M-mode): 1.7 cm  LEFT ATRIUM           Index        RIGHT ATRIUM          Index LA diam:      2.20 cm 1.18 cm/m   RA Area:     9.54 cm LA Vol (A2C): 31.2 ml 16.69 ml/m  RA Volume:   19.60 ml 10.49 ml/m LA Vol (A4C): 12.0 ml 6.42 ml/m AORTIC VALVE LVOT Vmax:   86.20 cm/s LVOT Vmean:  64.700 cm/s LVOT VTI:    0.172 m  AORTA Ao Root diam: 3.30 cm Ao Asc diam:  3.70 cm  MITRAL VALVE MV Area (PHT): 2.62 cm    SHUNTS MV Decel Time: 289 msec    Systemic VTI:  0.17 m MV E velocity: 65.50 cm/s  Systemic Diam: 1.90 cm MV A velocity: 81.20 cm/s MV E/A ratio:  0.81  Arvilla Meres MD Electronically signed by Arvilla Meres MD Signature Date/Time: 07/20/2022/1:16:10 PM    Final   TEE  ECHO INTRAOPERATIVE TEE  08/04/2022  Narrative *INTRAOPERATIVE TRANSESOPHAGEAL REPORT *    Patient Name:   ABDELRAHMAN PADMANABHAN Date of Exam: 08/04/2022 Medical Rec #:  [id number]      Height:       68.0 in Accession #:    [id number]     Weight:       163.0 lb Date of Birth:  1950/10/27      BSA:          1.87 m Patient Age:    71 years       BP:           125/90 mmHg Patient Gender: M              HR:           66 bpm. Exam Location:  Inpatient  Transesophogeal exam was perform intraoperatively during surgical procedure. Patient was closely monitored under general anesthesia during the entirety of examination.  Indications:     72 year old male with CAD for CABG Performing Phys: Karna Christmas, MD  Complications: No known complications during this procedure. POST-OP IMPRESSIONS Overall, there were no significant changes from pre-bypass. _ Left Ventricle:  The left ventricle is unchanged from pre-bypass. _ Right Ventricle: The right ventricle appears unchanged from pre-bypass. _ Aorta: The aorta appears unchanged from pre-bypass. _ Left Atrium: The left atrium appears unchanged from pre-bypass. _ Left Atrial Appendage: The left atrial appendage appears unchanged from pre-bypass. _ Aortic Valve: The aortic valve appears unchanged from pre-bypass. _ Mitral Valve: The mitral valve appears unchanged from pre-bypass. _ Tricuspid Valve: The tricuspid valve appears unchanged from pre-bypass. _ Pulmonic Valve: The pulmonic valve appears unchanged from pre-bypass. _ Interatrial Septum: The interatrial septum appears unchanged from pre-bypass. _ Interventricular Septum: The interventricular septum appears unchanged from pre-bypass. _ Pericardium: The pericardium appears unchanged from pre-bypass.  PRE-OP FINDINGS Left Ventricle: The left ventricle has normal systolic function, with an ejection fraction of 60-65%. The cavity size was normal. There is no increase in left ventricular wall thickness.  Right Ventricle: The  right ventricle has normal systolic function. The cavity was normal. There is no increase in right ventricular wall thickness.  Left Atrium: Left atrial size was normal in size. No left atrial/left atrial appendage thrombus was detected.  Right Atrium: Right atrial size was normal in size.  Interatrial Septum: No atrial level shunt detected by color flow Doppler.  Pericardium: There is no evidence of pericardial effusion.  Mitral Valve: The mitral valve is normal in structure. Mitral valve regurgitation is trivial by color flow Doppler.  Tricuspid Valve: The tricuspid valve was normal in structure. Tricuspid valve regurgitation was not visualized by color flow Doppler.  Aortic Valve: The aortic valve is tricuspid Aortic valve regurgitation was not visualized by color flow Doppler. There is no stenosis of the aortic valve. There is moderate thickening and moderate calcification present on the aortic valve non-coronary cusp with mildly decreased mobility.  Pulmonic Valve: The pulmonic valve was normal in structure. Pulmonic valve regurgitation is not visualized by color flow Doppler.   Aorta: The aortic root, ascending aorta and aortic arch are normal in size and structure.   Karna Christmas MD Electronically signed by Karna Christmas MD Signature Date/Time: 08/04/2022/1:04:09 PM    Final    CT SCANS  CT CORONARY MORPH W/CTA COR W/SCORE 07/18/2022  Addendum 07/18/2022 10:02 PM ADDENDUM REPORT: 07/18/2022 21:59  EXAM: OVER-READ INTERPRETATION  CT CHEST  The following report is an over-read performed by radiologist Dr. Alcide Clever of [facility] on 07/18/2022. This over-read does not include interpretation of cardiac or coronary anatomy or pathology. The coronary calcium score/coronary CTA interpretation by the cardiologist is attached.  COMPARISON:  06/24/2022  FINDINGS: Cardiovascular: There are no significant extracardiac vascular findings.  Mediastinum/Nodes:  There are no enlarged lymph nodes within the visualized mediastinum.The esophagus as visualized is within normal limits.  Lungs/Pleura: Lungs are well aerated without focal infiltrate. Minimal scarring is noted in the right lower lobe laterally stable from multiple previous exams dating back to 2022. No parenchymal nodules are noted. No effusion is seen.  Upper abdomen: No significant findings in the visualized upper abdomen.  Musculoskeletal/Chest wall: No chest wall mass or suspicious osseous findings within the visualized chest.  IMPRESSION: No significant extracardiac findings within the visualized chest.   Electronically Signed By: Alcide Clever M.D. On: 07/18/2022 21:59  Narrative CLINICAL DATA:  Chest pain  EXAM: Cardiac CTA  MEDICATIONS: Sub lingual nitro.  4mg   TECHNIQUE: The patient was scanned on a Siemens Force 192 slice scanner. Gantry rotation speed was 250 msecs. Collimation was .6 mm. A 120 kV prospective scan was triggered in the  ascending thoracic aorta at 140 HU's Full mA was used between 35% and 75% of the R-R interval. Average HR during the scan was 51 bpm. The 3D data set was interpreted on a dedicated work station using MPR, MIP and VRT modes. A total of 80 cc of contrast was used.  FINDINGS: Non-cardiac: See separate report from [facility]. No significant findings on limited lung and soft tissue windows.  Calcium Score: Calcium noted in RCA and LAD  LM 0  LAD 363  RCA 77  LCX 0  Total 440 which is 65 th percentile for age/sex  Coronary Arteries: Right dominant with no anomalies  LM: Normal  LAD: 25-49% mixed plaque in proximal and mid vessel  IM: Small vessel normal  D1: Small vessel normal  D2: Large vessel normal disease in LAD at ostium  Circumflex: 1-24% soft plaque in proximal vessel  OM1: Normal  OM2: Normal  RCA: Likely sub total occlusion 70-99% in proximal vessel No opacification seen on all phases  Distal to this area 25-49% calcified plaque and significant soft plaque in distal vessel  PDA: Normal  PLA: Normal  IMPRESSION: 1. Calcium score 440 which is 65 th percentile for age/sex  2.  Upper normal ascending thoracic aorta diameter 3.7 cm  3. Likely sub total occlusion 70-99% of proximal RCA with high soft plaque burden Study sent for [facility]  Charlton Haws  Electronically Signed: By: Charlton Haws M.D. On: 07/15/2022 14:49   CT SCANS  CT CARDIAC SCORING (SELF PAY ONLY) 06/25/2022  Addendum 06/25/2022  6:39 AM ADDENDUM REPORT: 06/25/2022 06:36  EXAM: OVER-READ INTERPRETATION  CT CHEST  The following report is an over-read performed by radiologist Dr. Royal Piedra [facility] on 06/25/2022. This over-read does not include interpretation of cardiac or coronary anatomy or pathology. The coronary calcium score interpretation by the cardiologist is attached.  COMPARISON:  Chest CTA 01/03/2021.  FINDINGS: Atherosclerotic calcifications in the thoracic aorta. Architectural distortion in the periphery of the right lower lobe, similar to prior examinations, most compatible with an area of chronic post infectious or inflammatory scarring. Within the visualized portions of the thorax there are no suspicious appearing pulmonary nodules or masses, there is no acute consolidative airspace disease, no pleural effusions, no pneumothorax and no lymphadenopathy. Visualized portions of the upper abdomen are unremarkable. There are no aggressive appearing lytic or blastic lesions noted in the visualized portions of the skeleton.  IMPRESSION: 1.  Aortic Atherosclerosis (ICD10-I70.0).   Electronically Signed By: Trudie Reed M.D. On: 06/25/2022 06:36  Narrative CLINICAL DATA:  Cardiovascular Disease Risk stratification  EXAM: Coronary Calcium Score  TECHNIQUE: A gated, non-contrast computed tomography scan of the heart was performed using 3mm slice  thickness. Axial images were analyzed on a dedicated workstation. Calcium scoring of the coronary arteries was performed using the Agatston method.  FINDINGS: Coronary Calcium Score:  Left main: 0.9  Left anterior descending artery: 406  Left circumflex artery: 0  Right coronary artery: 99.8  Total: 507  Percentile: 68th  Pericardium: Normal.  Non-cardiac: See separate report from [facility].  IMPRESSION: 1. Coronary calcium score of 507. This was 68th percentile for age-, race-, and sex-matched controls.  RECOMMENDATIONS: Coronary artery calcium (CAC) score is a strong predictor of incident coronary heart disease (CHD) and provides predictive information beyond traditional risk factors. CAC scoring is reasonable to use in the decision to withhold, postpone, or initiate statin therapy in intermediate-risk or selected borderline-risk asymptomatic adults (age 84-75 years and  LDL-C >=70 to <190 mg/dL) who do not have diabetes or established atherosclerotic cardiovascular disease (ASCVD).* In intermediate-risk (10-year ASCVD risk >=7.5% to <20%) adults or selected borderline-risk (10-year ASCVD risk >=5% to <7.5%) adults in whom a CAC score is measured for the purpose of making a treatment decision the following recommendations have been made:  If CAC=0, it is reasonable to withhold statin therapy and reassess in 5 to 10 years, as long as higher risk conditions are absent (diabetes mellitus, family history of premature CHD in first degree relatives (males <55 years; females <65 years), cigarette smoking, or LDL >=190 mg/dL).  If CAC is 1 to 99, it is reasonable to initiate statin therapy for patients >=75 years of age.  If CAC is >=100 or >=75th percentile, it is reasonable to initiate statin therapy at any age.  Cardiology referral should be considered for patients with CAC scores >=400 or >=75th percentile.  *2018  AHA/ACC/AACVPR/AAPA/ABC/ACPM/ADA/AGS/APhA/ASPC/NLA/PCNA Guideline on the Management of Blood Cholesterol: A Report of the American College of Cardiology/American Heart Association Task Force on Clinical Practice Guidelines. J Am Coll Cardiol. 2019;73(24):3168-3209.  Lennie Odor, MD  Electronically Signed: By: Lennie Odor M.D. On: 06/24/2022 16:33         Recent Labs: 08/05/2022: Magnesium 2.1 08/11/2022: ALT 28; Hemoglobin 11.1; Platelets 327 08/17/2022: BUN 12; Creatinine, Ser 0.93; Potassium 4.0; Sodium 138  Recent Lipid Panel No results found for: "CHOL", "TRIG", "HDL", "CHOLHDL", "VLDL", "LDLCALC", "LDLDIRECT"  History of Present Illness    72 year old male with the above past medical history including CAD s/p CABG x 3 (LIMA - LAD, SVG - Diag, SVG - PDA) in 07/2022, PE on Xarelto, Parkinson's disease, hypertension, hyperlipidemia, prostate cancer, arthritis, and GERD.   He has a history of PE on chronic Xarelto.  Coronary CTA in 07/2022 revealed coronary calcium score 440, with significant disease in RCA by FFR analysis.  Echocardiogram at the time showed EF 60 to 65%, normal LV function, G1 DD, normal RV function, no significant valvular abnormalities. He underwent cardiac catheterization in 07/2022 which revealed severe multivessel CAD. He was referred to CT surgery and underwent CABG x 3 (LIMA-LAD, SVG-diagonal, SVG-PDA) on 08/04/2022.  Postop course was complicated by hypotension mild fluid volume overload, and tachycardia as well as physical deconditioning.  He was started on metoprolol and Lasix.  He was discharged to CIR and subsequently home.  He was last seen in the office on 08/24/2022 and was stable from a cardiac standpoint.  He was referred to lipid clinic Pharm.D. and was started on Leqvio.   He presents today for follow-up.  Since his last visit he has been stable from a cardiac standpoint.  He does note some occasional discomfort around his sternal incision, denies symptoms  concerning for angina.  He is participating in cardiac rehab and is enjoying his time there.  He does note that he has had an ongoing hematoma at the vein harvesting site on his right thigh.  He notes he was told by the cardiothoracic surgeon that this would improve, however, he has not seen any improvement. It does cause occasional discomfort.  He, he has noticed increased arthralgias/myalgias with ongoing statin therapy.  He is interested in discontinuing Crestor.  Otherwise, he reports feeling well.  Home Medications    Current Outpatient Medications  Medication Sig Dispense Refill   acetaminophen (TYLENOL) 325 MG tablet Take 1-2 tablets (325-650 mg total) by mouth every 4 (four) hours as needed for mild pain.     aspirin  EC 81 MG tablet Take 1 tablet (81 mg total) by mouth daily. Swallow whole.     carbidopa-levodopa (SINEMET IR) 25-100 MG tablet Take 1 tablet by mouth 3 (three) times daily. 270 tablet 3   cetirizine (ZYRTEC) 10 MG tablet Take 10 mg by mouth daily.     cyanocobalamin (VITAMIN B12) 1000 MCG tablet Take 1,000 mcg by mouth daily.     donepezil (ARICEPT) 10 MG tablet Take 1 tablet (10 mg total) by mouth at bedtime. 90 tablet 3   gabapentin (NEURONTIN) 300 MG capsule Take 300 mg by mouth at bedtime as needed (pain).     inclisiran (LEQVIO) 284 MG/1.5ML SOSY injection Inject at 0, 3 months then q 6 months thereafter 1.5 mL 0   MELATONIN PO Take 2 tablets by mouth at bedtime.     metoprolol tartrate (LOPRESSOR) 25 MG tablet Take 0.5 tablets (12.5 mg total) by mouth 2 (two) times daily. 15 tablet 0   omeprazole (PRILOSEC OTC) 20 MG tablet Take 20 mg by mouth in the morning.     Polyvinyl Alcohol-Povidone PF (REFRESH) 1.4-0.6 % SOLN Place 1 drop into both eyes daily as needed (luberication and dry eyes).     rivaroxaban (XARELTO) 20 MG TABS tablet Take 20 mg by mouth daily with supper.     rosuvastatin (CRESTOR) 5 MG tablet Take 1 tablet (5 mg total) by mouth daily. 90 tablet 3   No  current facility-administered medications for this visit.     Review of Systems    He denies chest pain, palpitations, dyspnea, pnd, orthopnea, n, v, dizziness, syncope, edema, weight gain, or early satiety. All other systems reviewed and are otherwise negative except as noted above.   Physical Exam    VS:  BP 130/76 (BP Location: Left Arm, Patient Position: Sitting, Cuff Size: Normal)   Pulse 62   Ht 5' 7.5" (1.715 m)   Wt 163 lb 3.2 oz (74 kg)   SpO2 99%   BMI 25.18 kg/m  GEN: Well nourished, well developed, in no acute distress. HEENT: normal. Neck: Supple, no JVD, carotid bruits, or masses. Cardiac: RRR, no murmurs, rubs, or gallops. No clubbing, cyanosis, edema.  Radials/DP/PT 2+ and equal bilaterally.  Respiratory:  Respirations regular and unlabored, clear to auscultation bilaterally. GI: Soft, nontender, nondistended, BS + x 4. MS: no deformity or atrophy. Skin: warm and dry, no rash. Neuro:  Strength and sensation are intact. Psych: Normal affect.  Accessory Clinical Findings    ECG personally reviewed by me today - No EKG in office today   - no acute changes.   Lab Results  Component Value Date   WBC 9.4 08/11/2022   HGB 11.1 (L) 08/11/2022   HCT 32.7 (L) 08/11/2022   MCV 86.5 08/11/2022   PLT 327 08/11/2022   Lab Results  Component Value Date   CREATININE 0.93 08/17/2022   BUN 12 08/17/2022   NA 138 08/17/2022   K 4.0 08/17/2022   CL 106 08/17/2022   CO2 23 08/17/2022   Lab Results  Component Value Date   ALT 28 08/11/2022   AST 25 08/11/2022   ALKPHOS 51 08/11/2022   BILITOT 0.8 08/11/2022   No results found for: "CHOL", "HDL", "LDLCALC", "LDLDIRECT", "TRIG", "CHOLHDL"  Lab Results  Component Value Date   HGBA1C 6.2 (H) 07/31/2022    Assessment & Plan    1. CAD: S/p CABG x 3 (LIMA-LAD, SVG-diagonal, SVG-PDA) on 08/04/2022. Stable with no anginal symptoms.  Continue aspirin, metoprolol,  Crestor.   2.  Right thigh hematoma: Since his surgery he  has had an area of swelling approximately the size of a golf ball at the site of his R thigh vein harvest. This has been ongoing since his surgery.  Previously evaluated by CT surgery. Patient states he was told by Dr. Delia Chimes that this should improve.  However, he has not seen any improvement.  It does cause some occasional discomfort.  I will reach out to Dr. Allyson Sabal and Dr. Delia Chimes for recommendations for ongoing evaluation/management.   ADDENDUM 11/30/2022: Per Dr. Allyson Sabal, re: R thigh hematoma, "Enter is no longer at [facility] so he should probably be seen by one of the APPs at [facility].   JJB"  Discussed recommendations with patient by phone on 11/30/2022.  He verbalized understanding.  3. History of PE: On Xarelto. Managed per PCP.    4. Hypertension: BP well controlled. Continue current antihypertensive regimen.    5. Hyperlipidemia: LDL was 102 in 04/2022.  He notes increased arthralgias/myalgias with ongoing statin therapy.  Patient is requesting to discontinue Crestor.  Will discontinue Crestor, continue Leqvio.   6. Disposition: Follow-up as scheduled with Dr. Allyson Sabal in 01/2023.      Joylene Grapes, NP 11/24/2022, 10:41 AM

## 2022-11-25 DIAGNOSIS — Z951 Presence of aortocoronary bypass graft: Secondary | ICD-10-CM | POA: Diagnosis not present

## 2022-11-26 DIAGNOSIS — Z951 Presence of aortocoronary bypass graft: Secondary | ICD-10-CM | POA: Diagnosis not present

## 2022-11-30 DIAGNOSIS — Z951 Presence of aortocoronary bypass graft: Secondary | ICD-10-CM | POA: Diagnosis not present

## 2022-12-02 DIAGNOSIS — Z951 Presence of aortocoronary bypass graft: Secondary | ICD-10-CM | POA: Diagnosis not present

## 2022-12-07 DIAGNOSIS — Z951 Presence of aortocoronary bypass graft: Secondary | ICD-10-CM | POA: Diagnosis not present

## 2022-12-09 ENCOUNTER — Other Ambulatory Visit: Payer: Self-pay

## 2022-12-09 ENCOUNTER — Telehealth: Payer: Self-pay | Admitting: Cardiovascular Disease

## 2022-12-09 DIAGNOSIS — I2510 Atherosclerotic heart disease of native coronary artery without angina pectoris: Secondary | ICD-10-CM

## 2022-12-09 DIAGNOSIS — I700 Atherosclerosis of aorta: Secondary | ICD-10-CM

## 2022-12-09 DIAGNOSIS — Z951 Presence of aortocoronary bypass graft: Secondary | ICD-10-CM | POA: Diagnosis not present

## 2022-12-09 DIAGNOSIS — E785 Hyperlipidemia, unspecified: Secondary | ICD-10-CM

## 2022-12-09 NOTE — Telephone Encounter (Signed)
Patient wants lab orders released to [facility] at [address] [place], [place] [address] as he wants to do his labs there tomorrow.

## 2022-12-09 NOTE — Telephone Encounter (Signed)
Patient is aware labs were released

## 2022-12-10 DIAGNOSIS — Z951 Presence of aortocoronary bypass graft: Secondary | ICD-10-CM | POA: Diagnosis not present

## 2022-12-10 DIAGNOSIS — I2510 Atherosclerotic heart disease of native coronary artery without angina pectoris: Secondary | ICD-10-CM | POA: Diagnosis not present

## 2022-12-10 DIAGNOSIS — I700 Atherosclerosis of aorta: Secondary | ICD-10-CM | POA: Diagnosis not present

## 2022-12-10 DIAGNOSIS — E785 Hyperlipidemia, unspecified: Secondary | ICD-10-CM | POA: Diagnosis not present

## 2022-12-11 DIAGNOSIS — M4316 Spondylolisthesis, lumbar region: Secondary | ICD-10-CM | POA: Diagnosis not present

## 2022-12-11 LAB — LIPID PANEL
Chol/HDL Ratio: 1.5 ratio (ref 0.0–5.0)
Cholesterol, Total: 68 mg/dL — ABNORMAL LOW (ref 100–199)
HDL: 46 mg/dL (ref >39)
LDL Chol Calc (NIH): 0 mg/dL (ref 0–99)
Triglycerides: 121 mg/dL (ref 0–149)
VLDL Cholesterol Cal: 22 mg/dL (ref 5–40)

## 2022-12-14 ENCOUNTER — Other Ambulatory Visit: Payer: Self-pay | Admitting: *Deleted

## 2022-12-14 DIAGNOSIS — T148XXA Other injury of unspecified body region, initial encounter: Secondary | ICD-10-CM

## 2022-12-14 DIAGNOSIS — Z951 Presence of aortocoronary bypass graft: Secondary | ICD-10-CM

## 2022-12-14 NOTE — Progress Notes (Signed)
Patient contacted the office stating he has a painful right thigh hematoma. States this has been present since surgery 08/04/22 by Dr. Delia Chimes. Patient states he was told this would go away, however it has not. Patient states it is the size of a golf ball. States it is painful at times. Denies it growing in size, fevers, and redness. [place] of this area will be ordered. Follow up in our office scheduled once [place] complete. Will make patient aware of appt's once scheduled.

## 2022-12-15 ENCOUNTER — Other Ambulatory Visit: Payer: Self-pay | Admitting: *Deleted

## 2022-12-15 DIAGNOSIS — T148XXA Other injury of unspecified body region, initial encounter: Secondary | ICD-10-CM

## 2022-12-16 ENCOUNTER — Ambulatory Visit (HOSPITAL_COMMUNITY)
Admission: RE | Admit: 2022-12-16 | Discharge: 2022-12-16 | Disposition: A | Discharge status: Home / Self Care | Payer: Medicare Other | Source: Ambulatory Visit | Attending: Cardiothoracic Surgery | Admitting: Cardiothoracic Surgery

## 2022-12-16 ENCOUNTER — Ambulatory Visit: Payer: Medicare Other

## 2022-12-16 ENCOUNTER — Ambulatory Visit: Payer: Medicare Other | Admitting: Surgical

## 2022-12-16 VITALS — BP 152/85 | HR 54 | Resp 18 | Ht 67.0 in | Wt 163.0 lb

## 2022-12-16 DIAGNOSIS — T148XXA Other injury of unspecified body region, initial encounter: Secondary | ICD-10-CM | POA: Diagnosis not present

## 2022-12-16 DIAGNOSIS — Z951 Presence of aortocoronary bypass graft: Secondary | ICD-10-CM

## 2022-12-16 DIAGNOSIS — T1490XA Injury, unspecified, initial encounter: Secondary | ICD-10-CM

## 2022-12-16 NOTE — Progress Notes (Signed)
[address]       [name] [address]             [phone]      BROGEN DUELL [facility] Medical Record #[id number] Date of Birth: [birth year]  Referring: Terry Gess, MD Primary Care: Terry Corn, MD Primary Cardiologist: Terry Batty, MD   Chief Complaint:   POST OP FOLLOW UP Date of Service: 08/04/2022  2:14 PM    OPERATIVE NOTE: Patient Name: Terry [name] Date of Birth: 1950-09-30 Date of Operation: 08/04/22   PRE-OPERATIVE DIAGNOSIS: Coronary artery disease' History of multiple pulmonary emboli   POST-OPERATIVE DIAGNOSIS: Same   OPERATION: CABG x 3 (LIMA - LAD, SVG - Diag, SVG - PDA) Endoscopic saphenous vein harvest Placement of 45mm Atriclip on left atrial appendage   SURGEON: Terry [name] Enter MD   ASSISTANT: Terry Dandy PA and Terry Mare PA     History of Present Illness:    Patient is a 72 year old male in the office on today's date due to complaints of swelling of the lower thigh EVH site.  Swelling has been present since surgery and is associated with intermittent differing levels of pain.  There is no recent fevers, chills or other constitutional symptoms.  There is been no drainage associated with the incision.  He has chronic back pain issues but is otherwise overall doing fairly well.      Past Medical History:  Diagnosis Date   Arthritis    neck, back, knees, right ankle   Bladder calculus    Chronic back pain    Coronary artery disease    GERD (gastroesophageal reflux disease)    History of colon polyps    benign   History of kidney stones    and bladder stone   History of prostate cancer urologist-- dr Terry [name]   dx 2014---  s/p  prostatectomy 05-22-2013 ,  Gleason 3+3   Hypercholesterolemia    takes Crestor daily   Hyperlipidemia    Pneumonia 10/2019   Pulmonary emboli (HCC) 10/2019   Wears glasses      Social History   Tobacco Use  Smoking Status Never  Smokeless Tobacco Never    Social History    Substance and Sexual Activity  Alcohol Use Yes   Alcohol/week: 0.0 standard drinks of alcohol   Comment: occ     Allergies  Allergen Reactions   Lipitor [Atorvastatin] Other (See Comments)    Muscle issues   Penicillins Hives, Itching and Other (See Comments)    Has patient had a PCN reaction causing immediate rash, facial/tongue/throat swelling, SOB or lightheadedness with hypotension: No Has patient had a PCN reaction causing SEVERE RASH INVOLVING MUCUS MEMBRANES or SKIN NECROSIS: #  #  #  YES  #  #  #  Has patient had a PCN reaction that required hospitalization No Has patient had a PCN reaction occurring within the last 10 years: No     Current Outpatient Medications  Medication Sig Dispense Refill   acetaminophen (TYLENOL) 325 MG tablet Take 1-2 tablets (325-650 mg total) by mouth every 4 (four) hours as needed for mild pain.     aspirin EC 81 MG tablet Take 1 tablet (81 mg total) by mouth daily. Swallow whole.     carbidopa-levodopa (SINEMET IR) 25-100 MG tablet Take 1 tablet by mouth 3 (three) times daily. 270 tablet 3   cetirizine (ZYRTEC) 10 MG tablet Take 10 mg by mouth daily.  cyanocobalamin (VITAMIN B12) 1000 MCG tablet Take 1,000 mcg by mouth daily.     donepezil (ARICEPT) 10 MG tablet Take 1 tablet (10 mg total) by mouth at bedtime. 90 tablet 3   gabapentin (NEURONTIN) 300 MG capsule Take 300 mg by mouth at bedtime as needed (pain).     inclisiran (LEQVIO) 284 MG/1.5ML SOSY injection Inject at 0, 3 months then q 6 months thereafter 1.5 mL 0   MELATONIN PO Take 2 tablets by mouth at bedtime.     metoprolol tartrate (LOPRESSOR) 25 MG tablet Take 0.5 tablets (12.5 mg total) by mouth 2 (two) times daily. 15 tablet 0   omeprazole (PRILOSEC OTC) 20 MG tablet Take 20 mg by mouth in the morning.     Polyvinyl Alcohol-Povidone PF (REFRESH) 1.4-0.6 % SOLN Place 1 drop into both eyes daily as needed (luberication and dry eyes).     rivaroxaban (XARELTO) 20 MG TABS tablet  Take 20 mg by mouth daily with supper.     No current facility-administered medications for this visit.       Physical Exam: BP (!) 152/85 (BP Location: Left Arm, Patient Position: Sitting)   Pulse (!) 54   Resp 18   Ht 5\' 7"  (1.702 m)   Wt 163 lb (73.9 kg)   SpO2 98% Comment: RA  BMI 25.53 kg/m   General appearance: alert, cooperative, and no distress Wound: EVH site right thigh reveals a firm area of fluid collection.  No erythema or drainage associated with this.   Diagnostic Studies & Laboratory data:     Recent Radiology Findings:   VAS [place] LOWER EXTREMITY VENOUS (DVT)  Result Date: 12/16/2022  Lower Venous DVT Study Patient Name:  Terry [name]  Date of Exam:   12/16/2022 Medical Rec #: [id number]       Accession #:    [id number] Date of Birth: 10-26-1950       Patient Gender: M Patient Age:   35 years Exam Location:  [facility] Procedure:      VAS [place] LOWER EXTREMITY VENOUS (DVT) Referring Phys: Terry [name] --------------------------------------------------------------------------------  Indications: S/p CABG 08/04/22, hematoma at site of [facility].  Comparison Study: Previous study 08/13/20 negative. Performing Technologist: Terry [name] RVT, VT  Examination Guidelines: A complete evaluation includes B-mode imaging, spectral Doppler, color Doppler, and power Doppler as needed of all accessible portions of each vessel. Bilateral testing is considered an integral part of a complete examination. Limited examinations for reoccurring indications may be performed as noted. The reflux portion of the exam is performed with the patient in reverse Trendelenburg.  +---------+---------------+---------+-----------+----------+--------------+ RIGHT    CompressibilityPhasicitySpontaneityPropertiesThrombus Aging +---------+---------------+---------+-----------+----------+--------------+ CFV      Full           Yes      Yes                                  +---------+---------------+---------+-----------+----------+--------------+ SFJ      Full                                                        +---------+---------------+---------+-----------+----------+--------------+ FV Prox  Full                                                        +---------+---------------+---------+-----------+----------+--------------+  FV Mid   Full                                                        +---------+---------------+---------+-----------+----------+--------------+ FV DistalFull                                                        +---------+---------------+---------+-----------+----------+--------------+ PFV      Full                                                        +---------+---------------+---------+-----------+----------+--------------+ POP      Full           Yes      Yes                                 +---------+---------------+---------+-----------+----------+--------------+ PTV      Full                                                        +---------+---------------+---------+-----------+----------+--------------+ PERO     Full                                                        +---------+---------------+---------+-----------+----------+--------------+   +----+---------------+---------+-----------+----------+--------------+ LEFTCompressibilityPhasicitySpontaneityPropertiesThrombus Aging +----+---------------+---------+-----------+----------+--------------+ CFV Full           Yes      Yes                                 +----+---------------+---------+-----------+----------+--------------+ SFJ Full                                                        +----+---------------+---------+-----------+----------+--------------+     Summary: RIGHT: - There is no evidence of deep vein thrombosis in the lower extremity.  - No cystic structure found in the popliteal fossa. -  Non-vascularized area is noted and measured at right medial distal thigh.  LEFT: - No evidence of common femoral vein obstruction.  *See table(s) above for measurements and observations.    Preliminary       Recent Lab Findings: Lab Results  Component Value Date   WBC 9.4 08/11/2022   HGB 11.1 (L) 08/11/2022   HCT 32.7 (L) 08/11/2022   PLT 327 08/11/2022   GLUCOSE 103 (H) 08/17/2022   CHOL 68 (L) 12/10/2022   TRIG 121 12/10/2022  HDL 46 12/10/2022   LDLCALC 0 12/10/2022   ALT 28 08/11/2022   AST 25 08/11/2022   NA 138 08/17/2022   K 4.0 08/17/2022   CL 106 08/17/2022   CREATININE 0.93 08/17/2022   BUN 12 08/17/2022   CO2 23 08/17/2022   TSH 2.520 09/29/2021   INR 1.5 (H) 08/04/2022   HGBA1C 6.2 (H) 07/31/2022      Assessment / Plan: Seroma in the region of the right thigh EVH site.  Brief procedure: After Betadine prep a 18-gauge needle was inserted to the midportion of the incision and drained 20 cc of serous fluid.  No purulence noted.  The swelling dramatically improved.  A dry sterile dressing was placed and wrapped with an ace wrap loosely.  Plan: Keep Ace wrap dressing in place for 3 days.  May shower.  Needs to be removed if any evidence of increased edema.  Discussed with the patient that it is something that could potentially return.  Will see again on a as needed basis if this does redevelop.      Medication Changes: No orders of the defined types were placed in this encounter.     Rowe Clack, PA-C  12/16/2022 12:27 PM

## 2022-12-16 NOTE — Patient Instructions (Signed)
Ace wrap and dry sterile dressings x 3 days as instructed

## 2022-12-17 DIAGNOSIS — Z951 Presence of aortocoronary bypass graft: Secondary | ICD-10-CM | POA: Diagnosis not present

## 2022-12-18 ENCOUNTER — Ambulatory Visit: Payer: Medicare Other | Admitting: Physician Assistant

## 2022-12-18 VITALS — BP 138/77 | HR 64 | Resp 20 | Ht 67.0 in | Wt 163.0 lb

## 2022-12-18 DIAGNOSIS — T148XXA Other injury of unspecified body region, initial encounter: Secondary | ICD-10-CM | POA: Insufficient documentation

## 2022-12-18 DIAGNOSIS — Z5189 Encounter for other specified aftercare: Secondary | ICD-10-CM

## 2022-12-18 DIAGNOSIS — Z951 Presence of aortocoronary bypass graft: Secondary | ICD-10-CM | POA: Diagnosis not present

## 2022-12-18 NOTE — Progress Notes (Signed)
HPI: Mr. Dontreal Miera is a nondiabetic with a past history notable for arthritis, chronic back pain, history of kidney stones, history of prostate cancer, history of pulmonary embolus, and dyslipidemia.  He is status post coronary bypass grafting x 3 by Dr. Delia Chimes in March of this year with saphenous vein harvested from the right lower extremity.  He had an uneventful postoperative course and was discharged to the inpatient rehab center at [facility] on postop day 6.  He returned to the office last week with complaint of swelling in his right lower extremity over the [facility] tunnel.  An ultrasound of the right lower extremity was obtained showing no evidence of venous thrombosis.  He was seen in clinic by Mr. Gershon Crane and was noted to have a seroma at the RLE [facility] port incision.  This was drained with an 18ga needle with 20ml clear serous fluid removed.  He was asked to keep an ace wrap over the site  He returns today, 2 day later, reporting the seroma has re-accumulated and is the same size as before it was drained. He also said it had been there since April.  He is having mild discomfort at the site, no fever, and he has noted no redness or local tenderness.   Current Outpatient Medications  Medication Sig Dispense Refill   acetaminophen (TYLENOL) 325 MG tablet Take 1-2 tablets (325-650 mg total) by mouth every 4 (four) hours as needed for mild pain.     aspirin EC 81 MG tablet Take 1 tablet (81 mg total) by mouth daily. Swallow whole.     carbidopa-levodopa (SINEMET IR) 25-100 MG tablet Take 1 tablet by mouth 3 (three) times daily. 270 tablet 3   cetirizine (ZYRTEC) 10 MG tablet Take 10 mg by mouth daily.     cyanocobalamin (VITAMIN B12) 1000 MCG tablet Take 1,000 mcg by mouth daily.     donepezil (ARICEPT) 10 MG tablet Take 1 tablet (10 mg total) by mouth at bedtime. 90 tablet 3   gabapentin (NEURONTIN) 300 MG capsule Take 300 mg by mouth at bedtime as needed (pain).     inclisiran (LEQVIO) 284  MG/1.5ML SOSY injection Inject at 0, 3 months then q 6 months thereafter 1.5 mL 0   MELATONIN PO Take 2 tablets by mouth at bedtime.     metoprolol tartrate (LOPRESSOR) 25 MG tablet Take 0.5 tablets (12.5 mg total) by mouth 2 (two) times daily. 15 tablet 0   omeprazole (PRILOSEC OTC) 20 MG tablet Take 20 mg by mouth in the morning.     Polyvinyl Alcohol-Povidone PF (REFRESH) 1.4-0.6 % SOLN Place 1 drop into both eyes daily as needed (luberication and dry eyes).     rivaroxaban (XARELTO) 20 MG TABS tablet Take 20 mg by mouth daily with supper.     No current facility-administered medications for this visit.    Physical Exam: VS BP 138/77 HR 64 RR 20 General: Mr. Venning is in no acute distress.  Chest: respirations non-labored. The sternal incision is well healed and the sternum is stable  EXT's: Mild swelling in the right ankle (chronic).  There is a ~4x5cm firm fluid collection at the [facility] port site.  There is no erythema and no drainage.  There is very mild tenderness at the site.  There is no palpable fluid collection along the  [facility] tunnel.     Diagnostic Tests: None today  Impression / Plan: Mr. Musich was seen with me by Dr. Cliffton Asters. Recurrent seroma at  the RLE EVH port incision.  Given this has been present for about 3 months it is doubtful we could get this to resolve with repeated percutaneous drainage. Surgical I&D with placement of either a wound vac or a drain was offered to Mr. Hornik.  He said he had several events on his schedule coming up and would like to postpone a procedure for a while.  This seems acceptable since there are no signs of infection and he is not particularly uncomfortable.  Will schedule follow up with Dr. Cliffton Asters in 1 month to discuss further.  He is asked to contact us if there are any changes of concern before his next appointment.    Leary Roca, PA-C [facility] [phone]

## 2022-12-18 NOTE — Patient Instructions (Signed)
You may continue to use local compression to the seroma if desired.   Follow up with Dr. Cliffton Asters in 1 month to discuss surgery.   Call if you develop more pain, swelling, redness or drainage from the site.

## 2022-12-21 DIAGNOSIS — Z951 Presence of aortocoronary bypass graft: Secondary | ICD-10-CM | POA: Diagnosis not present

## 2022-12-28 ENCOUNTER — Other Ambulatory Visit: Payer: Self-pay | Admitting: *Deleted

## 2022-12-28 ENCOUNTER — Encounter: Payer: Self-pay | Admitting: *Deleted

## 2022-12-28 DIAGNOSIS — Z951 Presence of aortocoronary bypass graft: Secondary | ICD-10-CM

## 2022-12-28 DIAGNOSIS — T148XXA Other injury of unspecified body region, initial encounter: Secondary | ICD-10-CM

## 2023-01-07 DIAGNOSIS — M5116 Intervertebral disc disorders with radiculopathy, lumbar region: Secondary | ICD-10-CM | POA: Diagnosis not present

## 2023-01-07 DIAGNOSIS — M5416 Radiculopathy, lumbar region: Secondary | ICD-10-CM | POA: Diagnosis not present

## 2023-01-11 NOTE — Pre-Procedure Instructions (Signed)
Surgical Instructions   Your procedure is scheduled on Thursday, August 15th. Report to [facility] Main Entrance "A" at 11:40 A.M., then check in with the Admitting office. Any questions or running late day of surgery: call [phone]  Questions prior to your surgery date: call [phone], Monday-Friday, 8am-4pm. If you experience any cold or flu symptoms such as cough, fever, chills, shortness of breath, etc. between now and your scheduled surgery, please notify us at the above number.     Remember:  Do not eat after midnight the night before your surgery   You may drink clear liquids until 10:40 AM the morning of your surgery.   Clear liquids allowed are: Water, Non-Citrus Juices (without pulp), Carbonated Beverages, Clear Tea, Black Coffee Only (NO MILK, CREAM OR POWDERED CREAMER of any kind), and Gatorade.    Take these medicines the morning of surgery with A SIP OF WATER  metoprolol tartrate (LOPRESSOR)  carbidopa-levodopa (SINEMET IR)  omeprazole (PRILOSEC OTC)     May take these medicines IF NEEDED: acetaminophen (TYLENOL)  cetirizine (ZYRTEC)  Polyvinyl Alcohol-Povidone PF (REFRESH)   STOP XARELTO 3 DAYS PRIOR TO SURGERY. LAST DOSE 8/11.  One week prior to surgery, STOP taking any Aleve, Naproxen, Ibuprofen, Motrin, Advil, Goody's, BC's, all herbal medications, fish oil, and non-prescription vitamins.                     Do NOT Smoke (Tobacco/Vaping) for 24 hours prior to your procedure.  If you use a CPAP at night, you may bring your mask/headgear for your overnight stay.   You will be asked to remove any contacts, glasses, piercing's, hearing aid's, dentures/partials prior to surgery. Please bring cases for these items if needed.    Patients discharged the day of surgery will not be allowed to drive home, and someone needs to stay with them for 24 hours.  SURGICAL WAITING ROOM VISITATION Patients may have no more than 2 support people in the waiting area -  these visitors may rotate.   Pre-op nurse will coordinate an appropriate time for 1 ADULT support person, who may not rotate, to accompany patient in pre-op.  Children under the age of 4 must have an adult with them who is not the patient and must remain in the main waiting area with an adult.  If the patient needs to stay at the hospital during part of their recovery, the visitor guidelines for inpatient rooms apply.  Please refer to the [facility] website for the visitor guidelines for any additional information.   If you received a COVID test during your pre-op visit  it is requested that you wear a mask when out in public, stay away from anyone that may not be feeling well and notify your surgeon if you develop symptoms. If you have been in contact with anyone that has tested positive in the last 10 days please notify you surgeon.      Pre-operative CHG Bathing Instructions   You can play a key role in reducing the risk of infection after surgery. Your skin needs to be as free of germs as possible. You can reduce the number of germs on your skin by washing with CHG (chlorhexidine gluconate) soap before surgery. CHG is an antiseptic soap that kills germs and continues to kill germs even after washing.   DO NOT use if you have an allergy to chlorhexidine/CHG or antibacterial soaps. If your skin becomes reddened or irritated, stop using the CHG and notify  one of our RNs at [phone].              TAKE A SHOWER THE NIGHT BEFORE SURGERY AND THE DAY OF SURGERY    Please keep in mind the following:  DO NOT shave, including legs and underarms, 48 hours prior to surgery.   You may shave your face before/day of surgery.  Place clean sheets on your bed the night before surgery Use a clean washcloth (not used since being washed) for each shower. DO NOT sleep with pet's night before surgery.  CHG Shower Instructions:  If you choose to wash your hair and private area, wash first with your  normal shampoo/soap.  After you use shampoo/soap, rinse your hair and body thoroughly to remove shampoo/soap residue.  Turn the water OFF and apply half the bottle of CHG soap to a CLEAN washcloth.  Apply CHG soap ONLY FROM YOUR NECK DOWN TO YOUR TOES (washing for 3-5 minutes)  DO NOT use CHG soap on face, private areas, open wounds, or sores.  Pay special attention to the area where your surgery is being performed.  If you are having back surgery, having someone wash your back for you may be helpful. Wait 2 minutes after CHG soap is applied, then you may rinse off the CHG soap.  Pat dry with a clean towel  Put on clean pajamas    Additional instructions for the day of surgery: DO NOT APPLY any lotions, deodorants, cologne, or perfumes.   Do not wear jewelry or makeup Do not wear nail polish, gel polish, artificial nails, or any other type of covering on natural nails (fingers and toes) Do not bring valuables to the hospital. [facility] is not responsible for valuables/personal belongings. Put on clean/comfortable clothes.  Please brush your teeth.  Ask your nurse before applying any prescription medications to the skin.

## 2023-01-12 ENCOUNTER — Ambulatory Visit (HOSPITAL_COMMUNITY): Admission: RE | Admit: 2023-01-12 | Payer: Medicare Other | Source: Ambulatory Visit

## 2023-01-12 ENCOUNTER — Encounter (HOSPITAL_COMMUNITY): Payer: Self-pay

## 2023-01-12 ENCOUNTER — Other Ambulatory Visit: Payer: Self-pay

## 2023-01-12 ENCOUNTER — Encounter (HOSPITAL_COMMUNITY)
Admission: RE | Admit: 2023-01-12 | Discharge: 2023-01-12 | Disposition: A | Discharge status: Home / Self Care | Payer: Medicare Other | Source: Ambulatory Visit | Attending: Thoracic Surgery (Cardiothoracic Vascular Surgery) | Admitting: Thoracic Surgery (Cardiothoracic Vascular Surgery)

## 2023-01-12 DIAGNOSIS — Z951 Presence of aortocoronary bypass graft: Secondary | ICD-10-CM

## 2023-01-12 DIAGNOSIS — X58XXXA Exposure to other specified factors, initial encounter: Secondary | ICD-10-CM | POA: Insufficient documentation

## 2023-01-12 DIAGNOSIS — Z01818 Encounter for other preprocedural examination: Secondary | ICD-10-CM | POA: Diagnosis not present

## 2023-01-12 DIAGNOSIS — R001 Bradycardia, unspecified: Secondary | ICD-10-CM | POA: Insufficient documentation

## 2023-01-12 DIAGNOSIS — Z01811 Encounter for preprocedural respiratory examination: Secondary | ICD-10-CM | POA: Diagnosis not present

## 2023-01-12 DIAGNOSIS — Z01810 Encounter for preprocedural cardiovascular examination: Secondary | ICD-10-CM | POA: Diagnosis not present

## 2023-01-12 DIAGNOSIS — Z981 Arthrodesis status: Secondary | ICD-10-CM | POA: Diagnosis not present

## 2023-01-12 DIAGNOSIS — Z01812 Encounter for preprocedural laboratory examination: Secondary | ICD-10-CM | POA: Diagnosis not present

## 2023-01-12 DIAGNOSIS — T148XXA Other injury of unspecified body region, initial encounter: Secondary | ICD-10-CM | POA: Diagnosis not present

## 2023-01-12 DIAGNOSIS — Z8679 Personal history of other diseases of the circulatory system: Secondary | ICD-10-CM | POA: Diagnosis not present

## 2023-01-12 HISTORY — DX: Essential (primary) hypertension: I10

## 2023-01-12 LAB — COMPREHENSIVE METABOLIC PANEL
ALT: 17 U/L (ref 0–44)
AST: 19 U/L (ref 15–41)
Albumin: 3.7 g/dL (ref 3.5–5.0)
Alkaline Phosphatase: 79 U/L (ref 38–126)
Anion gap: 11 (ref 5–15)
BUN: 25 mg/dL — ABNORMAL HIGH (ref 8–23)
CO2: 27 mmol/L (ref 22–32)
Calcium: 9.4 mg/dL (ref 8.9–10.3)
Chloride: 101 mmol/L (ref 98–111)
Creatinine, Ser: 0.95 mg/dL (ref 0.61–1.24)
GFR, Estimated: >60 mL/min (ref >60)
Glucose, Bld: 120 mg/dL — ABNORMAL HIGH (ref 70–99)
Potassium: 4.2 mmol/L (ref 3.5–5.1)
Sodium: 139 mmol/L (ref 135–145)
Total Bilirubin: 0.6 mg/dL (ref 0.3–1.2)
Total Protein: 6.7 g/dL (ref 6.5–8.1)

## 2023-01-12 LAB — TYPE AND SCREEN
ABO/RH(D): B NEG
Antibody Screen: NEGATIVE

## 2023-01-12 LAB — CBC
HCT: 44.9 % (ref 39.0–52.0)
Hemoglobin: 13.7 g/dL (ref 13.0–17.0)
MCH: 22.9 pg — ABNORMAL LOW (ref 26.0–34.0)
MCHC: 30.5 g/dL (ref 30.0–36.0)
MCV: 75.2 fL — ABNORMAL LOW (ref 80.0–100.0)
Platelets: 374 10*3/uL (ref 150–400)
RBC: 5.97 MIL/uL — ABNORMAL HIGH (ref 4.22–5.81)
RDW: 17.8 % — ABNORMAL HIGH (ref 11.5–15.5)
WBC: 12.5 10*3/uL — ABNORMAL HIGH (ref 4.0–10.5)
nRBC: 0 % (ref 0.0–0.2)

## 2023-01-12 LAB — APTT: aPTT: 29 seconds (ref 24–36)

## 2023-01-12 LAB — PROTIME-INR
INR: 1.1 (ref 0.8–1.2)
Prothrombin Time: 14.2 seconds (ref 11.4–15.2)

## 2023-01-12 NOTE — Progress Notes (Signed)
PCP - Dr. Creola Corn Cardiologist - Dr. Nanetta Batty  PPM/ICD - denies   Chest x-ray - 01/12/23 EKG - 01/12/23 Stress Test - 12/21/22 ECHO - 08/04/22 Cardiac Cath - 07/20/22  Sleep Study - denies   DM- denies  Blood Thinner Instructions: Stop Xarelto 3 days prior to surgery. Last dose 8/11 Aspirin Instructions: n/a  ERAS Protcol - yes, no drink   COVID TEST- n/a   Anesthesia review: yes, cardiac hx  Patient denies shortness of breath, fever, cough and chest pain at PAT appointment   All instructions explained to the patient, with a verbal understanding of the material. Patient agrees to go over the instructions while at home for a better understanding.  The opportunity to ask questions was provided.

## 2023-01-14 ENCOUNTER — Ambulatory Visit (HOSPITAL_COMMUNITY)
Admission: RE | Admit: 2023-01-14 | Discharge: 2023-01-14 | Disposition: A | Discharge status: Home / Self Care | Payer: Medicare Other | Attending: Thoracic Surgery (Cardiothoracic Vascular Surgery) | Admitting: Thoracic Surgery (Cardiothoracic Vascular Surgery)

## 2023-01-14 ENCOUNTER — Ambulatory Visit (HOSPITAL_COMMUNITY): Payer: Medicare Other | Admitting: Physician Assistant

## 2023-01-14 ENCOUNTER — Other Ambulatory Visit: Payer: Self-pay

## 2023-01-14 ENCOUNTER — Encounter (HOSPITAL_COMMUNITY)
Admission: RE | Disposition: A | Discharge status: Home / Self Care | Payer: Self-pay | Source: Home / Self Care | Attending: Thoracic Surgery (Cardiothoracic Vascular Surgery)

## 2023-01-14 ENCOUNTER — Encounter (HOSPITAL_COMMUNITY): Payer: Self-pay | Admitting: Thoracic Surgery (Cardiothoracic Vascular Surgery)

## 2023-01-14 ENCOUNTER — Ambulatory Visit (HOSPITAL_BASED_OUTPATIENT_CLINIC_OR_DEPARTMENT_OTHER): Payer: Medicare Other | Admitting: Certified Registered Nurse Anesthetist

## 2023-01-14 ENCOUNTER — Telehealth: Payer: Self-pay | Admitting: Cardiovascular Disease

## 2023-01-14 DIAGNOSIS — K219 Gastro-esophageal reflux disease without esophagitis: Secondary | ICD-10-CM | POA: Diagnosis not present

## 2023-01-14 DIAGNOSIS — M96842 Postprocedural seroma of a musculoskeletal structure following a musculoskeletal system procedure: Secondary | ICD-10-CM | POA: Diagnosis not present

## 2023-01-14 DIAGNOSIS — Z86711 Personal history of pulmonary embolism: Secondary | ICD-10-CM | POA: Diagnosis not present

## 2023-01-14 DIAGNOSIS — E785 Hyperlipidemia, unspecified: Secondary | ICD-10-CM

## 2023-01-14 DIAGNOSIS — Z7901 Long term (current) use of anticoagulants: Secondary | ICD-10-CM | POA: Insufficient documentation

## 2023-01-14 DIAGNOSIS — I2510 Atherosclerotic heart disease of native coronary artery without angina pectoris: Secondary | ICD-10-CM | POA: Insufficient documentation

## 2023-01-14 DIAGNOSIS — Z8546 Personal history of malignant neoplasm of prostate: Secondary | ICD-10-CM | POA: Diagnosis not present

## 2023-01-14 DIAGNOSIS — I10 Essential (primary) hypertension: Secondary | ICD-10-CM

## 2023-01-14 DIAGNOSIS — Z951 Presence of aortocoronary bypass graft: Secondary | ICD-10-CM | POA: Diagnosis not present

## 2023-01-14 DIAGNOSIS — I97641 Postprocedural seroma of a circulatory system organ or structure following cardiac bypass: Secondary | ICD-10-CM | POA: Diagnosis not present

## 2023-01-14 DIAGNOSIS — M1990 Unspecified osteoarthritis, unspecified site: Secondary | ICD-10-CM | POA: Diagnosis not present

## 2023-01-14 DIAGNOSIS — I97648 Postprocedural seroma of a circulatory system organ or structure following other circulatory system procedure: Secondary | ICD-10-CM

## 2023-01-14 DIAGNOSIS — Z7982 Long term (current) use of aspirin: Secondary | ICD-10-CM | POA: Insufficient documentation

## 2023-01-14 DIAGNOSIS — T148XXA Other injury of unspecified body region, initial encounter: Secondary | ICD-10-CM

## 2023-01-14 DIAGNOSIS — S8011XA Contusion of right lower leg, initial encounter: Secondary | ICD-10-CM | POA: Diagnosis not present

## 2023-01-14 DIAGNOSIS — M7981 Nontraumatic hematoma of soft tissue: Secondary | ICD-10-CM | POA: Diagnosis not present

## 2023-01-14 HISTORY — PX: I & D EXTREMITY: SHX5045

## 2023-01-14 SURGERY — IRRIGATION AND DEBRIDEMENT EXTREMITY
Anesthesia: General | Laterality: Right

## 2023-01-14 MED ORDER — CHLORHEXIDINE GLUCONATE 0.12 % MT SOLN
OROMUCOSAL | Status: AC
  Filled 2023-01-14: qty 15

## 2023-01-14 MED ORDER — ONDANSETRON HCL 4 MG/2ML IJ SOLN
INTRAMUSCULAR | Status: AC
  Filled 2023-01-14: qty 2

## 2023-01-14 MED ORDER — VANCOMYCIN HCL IN DEXTROSE 1-5 GM/200ML-% IV SOLN
INTRAVENOUS | Status: AC
  Administered 2023-01-14: 1000 mg via INTRAVENOUS
  Filled 2023-01-14: qty 200

## 2023-01-14 MED ORDER — FENTANYL CITRATE (PF) 100 MCG/2ML IJ SOLN: 25.0000 ug | INTRAMUSCULAR | Status: DC | PRN

## 2023-01-14 MED ORDER — VANCOMYCIN HCL IN DEXTROSE 1-5 GM/200ML-% IV SOLN: 1000.0000 mg | INTRAVENOUS | Status: AC

## 2023-01-14 MED ORDER — OXYCODONE HCL 5 MG PO TABS: 5.0000 mg | ORAL_TABLET | ORAL | Status: DC | PRN

## 2023-01-14 MED ORDER — LACTATED RINGERS IV SOLN: INTRAVENOUS | Status: DC

## 2023-01-14 MED ORDER — MORPHINE SULFATE (PF) 2 MG/ML IV SOLN: 1.0000 mg | INTRAVENOUS | Status: DC | PRN

## 2023-01-14 MED ORDER — PROPOFOL 10 MG/ML IV BOLUS
INTRAVENOUS | Status: AC
  Filled 2023-01-14: qty 20

## 2023-01-14 MED ORDER — BUPIVACAINE LIPOSOME 1.3 % IJ SUSP
INTRAMUSCULAR | Status: DC | PRN
  Administered 2023-01-14: 20 mL

## 2023-01-14 MED ORDER — PROPOFOL 10 MG/ML IV BOLUS
INTRAVENOUS | Status: DC | PRN
  Administered 2023-01-14: 150 mg via INTRAVENOUS
  Administered 2023-01-14: 50 mg via INTRAVENOUS

## 2023-01-14 MED ORDER — BUPIVACAINE LIPOSOME 1.3 % IJ SUSP
INTRAMUSCULAR | Status: AC
  Filled 2023-01-14: qty 20

## 2023-01-14 MED ORDER — BUPIVACAINE HCL 0.25 % IJ SOLN
INTRAMUSCULAR | Status: DC | PRN
  Administered 2023-01-14: 30 mL

## 2023-01-14 MED ORDER — ORAL CARE MOUTH RINSE: 15.0000 mL | Freq: Once | OROMUCOSAL | Status: AC

## 2023-01-14 MED ORDER — DEXAMETHASONE SODIUM PHOSPHATE 10 MG/ML IJ SOLN
INTRAMUSCULAR | Status: AC
  Filled 2023-01-14: qty 1

## 2023-01-14 MED ORDER — ONDANSETRON HCL 4 MG/2ML IJ SOLN
INTRAMUSCULAR | Status: DC | PRN
Start: 2023-01-14 — End: 2023-01-14
  Administered 2023-01-14: 4 mg via INTRAVENOUS

## 2023-01-14 MED ORDER — LIDOCAINE 2% (20 MG/ML) 5 ML SYRINGE
INTRAMUSCULAR | Status: DC | PRN
  Administered 2023-01-14: 60 mg via INTRAVENOUS

## 2023-01-14 MED ORDER — CHLORHEXIDINE GLUCONATE 0.12 % MT SOLN
15.0000 mL | Freq: Once | OROMUCOSAL | Status: AC
  Administered 2023-01-14: 15 mL via OROMUCOSAL

## 2023-01-14 MED ORDER — FENTANYL CITRATE (PF) 250 MCG/5ML IJ SOLN
INTRAMUSCULAR | Status: DC | PRN
  Administered 2023-01-14 (×2): 25 ug via INTRAVENOUS

## 2023-01-14 MED ORDER — FENTANYL CITRATE (PF) 250 MCG/5ML IJ SOLN
INTRAMUSCULAR | Status: AC
  Filled 2023-01-14: qty 5

## 2023-01-14 MED ORDER — BUPIVACAINE HCL (PF) 0.25 % IJ SOLN
INTRAMUSCULAR | Status: AC
  Filled 2023-01-14: qty 30

## 2023-01-14 MED ORDER — DEXAMETHASONE SODIUM PHOSPHATE 10 MG/ML IJ SOLN
INTRAMUSCULAR | Status: DC | PRN
  Administered 2023-01-14: 10 mg via INTRAVENOUS

## 2023-01-14 MED ORDER — PHENYLEPHRINE 80 MCG/ML (10ML) SYRINGE FOR IV PUSH (FOR BLOOD PRESSURE SUPPORT)
PREFILLED_SYRINGE | INTRAVENOUS | Status: DC | PRN
  Administered 2023-01-14 (×5): 80 ug via INTRAVENOUS

## 2023-01-14 MED ORDER — ACETAMINOPHEN 10 MG/ML IV SOLN: 1000.0000 mg | Freq: Once | INTRAVENOUS | Status: DC | PRN

## 2023-01-14 MED ORDER — SODIUM CHLORIDE 0.9 % IR SOLN
Status: DC | PRN
  Administered 2023-01-14: 1

## 2023-01-14 SURGICAL SUPPLY — 34 items
BAG COUNTER SPONGE SURGICOUNT (BAG) ×1 IMPLANT
BAG SPNG CNTER NS LX DISP (BAG) ×1
CANISTER SUCT 3000ML PPV (MISCELLANEOUS) ×1 IMPLANT
CANISTER WOUNDNEG PRESSURE 500 (CANNISTER) IMPLANT
CNTNR URN SCR LID CUP LEK RST (MISCELLANEOUS) IMPLANT
CONT SPEC 4OZ STRL OR WHT (MISCELLANEOUS) ×1
COVER SURGICAL LIGHT HANDLE (MISCELLANEOUS) ×1 IMPLANT
DRSG CUTIMED SORBACT 7X9 (GAUZE/BANDAGES/DRESSINGS) IMPLANT
DRSG TEGADERM 4X4.75 (GAUZE/BANDAGES/DRESSINGS) IMPLANT
DRSG VAC GRANUFOAM SM (GAUZE/BANDAGES/DRESSINGS) IMPLANT
DRSG VERAFLO VAC MED (GAUZE/BANDAGES/DRESSINGS) IMPLANT
ELECT REM PT RETURN 9FT ADLT (ELECTROSURGICAL) ×1
ELECTRODE REM PT RTRN 9FT ADLT (ELECTROSURGICAL) ×1 IMPLANT
GAUZE SPONGE 4X4 12PLY STRL (GAUZE/BANDAGES/DRESSINGS) ×1 IMPLANT
GAUZE XEROFORM 5X9 LF (GAUZE/BANDAGES/DRESSINGS) IMPLANT
GFT MATRIX 2 LAYER 5X5 (Graft) ×1 IMPLANT
GLOVE BIO SURGEON STRL SZ7.5 (GLOVE) ×1 IMPLANT
GOWN STRL REUS W/ TWL LRG LVL3 (GOWN DISPOSABLE) ×2 IMPLANT
GOWN STRL REUS W/ TWL XL LVL3 (GOWN DISPOSABLE) ×1 IMPLANT
GOWN STRL REUS W/TWL LRG LVL3 (GOWN DISPOSABLE) ×2
GOWN STRL REUS W/TWL XL LVL3 (GOWN DISPOSABLE) ×1
GRAFT MATRIX 2 LAYER 5X5 (Graft) IMPLANT
KIT BASIN OR (CUSTOM PROCEDURE TRAY) ×1 IMPLANT
KIT DRSG PREVENA PLUS 7DAY 125 (MISCELLANEOUS) IMPLANT
KIT TURNOVER KIT B (KITS) ×1 IMPLANT
NS IRRIG 1000ML POUR BTL (IV SOLUTION) ×1 IMPLANT
PACK CV ACCESS (CUSTOM PROCEDURE TRAY) IMPLANT
PACK GENERAL/GYN (CUSTOM PROCEDURE TRAY) ×1 IMPLANT
PACK UNIVERSAL I (CUSTOM PROCEDURE TRAY) ×1 IMPLANT
PAD ARMBOARD 7.5X6 YLW CONV (MISCELLANEOUS) ×2 IMPLANT
POWDER MYRIAD MORCELLS 500MG (Miscellaneous) IMPLANT
SUT VIC AB 3-0 SH 8-18 (SUTURE) IMPLANT
TOWEL GREEN STERILE (TOWEL DISPOSABLE) ×1 IMPLANT
WATER STERILE IRR 1000ML POUR (IV SOLUTION) ×1 IMPLANT

## 2023-01-14 NOTE — H&P (Signed)
HPI: [name] is a nondiabetic with a past history notable for arthritis, chronic back pain, history of kidney stones, history of prostate cancer, history of pulmonary embolus, and dyslipidemia.  He is status post coronary bypass grafting x 3 by Dr. Delia Chimes in March of this year with saphenous vein harvested from the right lower extremity.  He had an uneventful postoperative course and was discharged to the inpatient rehab center at [facility] on postop day 6.  He returned to the office last week with complaint of swelling in his right lower extremity over the [facility] tunnel.  An ultrasound of the right lower extremity was obtained showing no evidence of venous thrombosis.  He was seen in clinic by [name] and was noted to have a seroma at the RLE [facility] port incision.  This was drained with an 18ga needle with 20ml clear serous fluid removed.  He was asked to keep an ace wrap over the site  He returns today, 2 day later, reporting the seroma has re-accumulated and is the same size as before it was drained. He also said it had been there since April.  He is having mild discomfort at the site, no fever, and he has noted no redness or local tenderness.          Current Outpatient Medications  Medication Sig Dispense Refill   acetaminophen (TYLENOL) 325 MG tablet Take 1-2 tablets (325-650 mg total) by mouth every 4 (four) hours as needed for mild pain.       aspirin EC 81 MG tablet Take 1 tablet (81 mg total) by mouth daily. Swallow whole.       carbidopa-levodopa (SINEMET IR) 25-100 MG tablet Take 1 tablet by mouth 3 (three) times daily. 270 tablet 3   cetirizine (ZYRTEC) 10 MG tablet Take 10 mg by mouth daily.       cyanocobalamin (VITAMIN B12) 1000 MCG tablet Take 1,000 mcg by mouth daily.       donepezil (ARICEPT) 10 MG tablet Take 1 tablet (10 mg total) by mouth at bedtime. 90 tablet 3   gabapentin (NEURONTIN) 300 MG capsule Take 300 mg by mouth at bedtime as needed (pain).        inclisiran (LEQVIO) 284 MG/1.5ML SOSY injection Inject at 0, 3 months then q 6 months thereafter 1.5 mL 0   MELATONIN PO Take 2 tablets by mouth at bedtime.       metoprolol tartrate (LOPRESSOR) 25 MG tablet Take 0.5 tablets (12.5 mg total) by mouth 2 (two) times daily. 15 tablet 0   omeprazole (PRILOSEC OTC) 20 MG tablet Take 20 mg by mouth in the morning.       Polyvinyl Alcohol-Povidone PF (REFRESH) 1.4-0.6 % SOLN Place 1 drop into both eyes daily as needed (luberication and dry eyes).       rivaroxaban (XARELTO) 20 MG TABS tablet Take 20 mg by mouth daily with supper.          No current facility-administered medications for this visit.        Physical Exam:  General: Terry [name] is in no acute distress.  Chest: respirations non-labored. The sternal incision is well healed and the sternum is stable  EXT's: Mild swelling in the right ankle (chronic).  There is a ~4x5cm firm fluid collection at the [facility] port site.  There is no erythema and no drainage.  There is very mild tenderness at the site.  There is no palpable fluid collection along the  EVH tunnel.      Diagnostic Tests: None today   Impression / Plan:  Recurrent seroma at the RLE [facility] port incision.  Given this has been present for about 3 months it is doubtful we could get this to resolve with repeated percutaneous drainage. Surgical I&D with placement of either a wound vac or a drain was offered to Terry [name].  He said he had several events on his schedule coming up and would like to postpone a procedure for a while.  This seems acceptable since there are no signs of infection and he is not particularly uncomfortable.     Terry [name]

## 2023-01-14 NOTE — Op Note (Signed)
      [address]       [place] [address]             [phone]          03/29/2021   Patient:  Philis Fendt Pre-Op Dx:     Right lower extremity seroma  Post-op Dx:  same Procedure: - incision and drainage of right lower extremity seroma - resection and debridement of seroma capsule - Placement of 500mg  myriad morcels - placement of 5X5cm myriad mesh matrix - Placement of wound vac   Surgeon and Role:      * , Eliezer Lofts, MD - Primary     Anesthesia  general EBL:  10 ml Blood Administration: none Specimen:  seroma capsule Indications: 72yo male with recurrent seroma at the RLE [facility] port incision. Given this has been present for about 3 months it is doubtful we could get this to resolve with repeated percutaneous drainage. Surgical I&D with placement of either a wound vac or a drain was offered to Mr. Janota. He said he had several events on his schedule coming up and would like to postpone a procedure for a while. This seems acceptable since there are no signs of infection and he is not particularly uncomfortable.    Findings: Mature capsule at the previous vein harvest site.  Resection resulted in a wound measuring 5 X 3 X 2.5cm.   Operative Technique: After the risks, benefits and alternatives were thoroughly discussed, the patient was brought to the operative theatre.  Anesthesia was induced, and she was prepped and draped in normal sterile fashion.  An appropriate surgical pause was performed and preoperative antibiotics were dosed accordingly.   We began with an incision over the seroma with release of clear fluid.  The incision was extended to fully mobilize the capsule.  It was resected in its entirety with a combination of blunt dissection and bovie cautery.  Hemostasis was achieved.  We attempted primary closure, but the tissue would not hold any suture, and given its proximity to the knee, I was concerned that this would fall apart with  ambulation.  The wound was then packed with myriad morcels, and covered with a matrix.  This was then covered with a wound vac.   The patient tolerated the procedure without any immediate complications, and was transferred to the PACU in stable condition.    Keane Scrape

## 2023-01-14 NOTE — Telephone Encounter (Signed)
Patient calling with some questions, concerning the injections he is suppose to have done Friday. Please advise

## 2023-01-14 NOTE — Transfer of Care (Signed)
Immediate Anesthesia Transfer of Care Note  Patient: Terry [name]  Procedure(s) Performed: IRRIGATION AND DEBRIDEMENT RIGHT LOWER EXTREMITY SEROMA (Right)  Patient Location: PACU  Anesthesia Type:General  Level of Consciousness: awake and drowsy  Airway & Oxygen Therapy: Patient Spontanous Breathing  Post-op Assessment: Report given to RN, Post -op Vital signs reviewed and stable, and Patient moving all extremities X 4  Post vital signs: Reviewed and stable  Last Vitals:  Vitals Value Taken Time  BP 169/82 01/14/23 1405  Temp 36.7 C 01/14/23 1406  Pulse 50 01/14/23 1409  Resp 9 01/14/23 1409  SpO2 97 % 01/14/23 1409  Vitals shown include unfiled device data.  Last Pain:  Vitals:   01/14/23 1201  PainSc: 0-No pain      Patients Stated Pain Goal: 0 (01/14/23 1201)  Complications: No notable events documented.

## 2023-01-14 NOTE — Anesthesia Preprocedure Evaluation (Addendum)
Anesthesia Evaluation  Patient identified by MRN, date of birth, ID band Patient awake    Reviewed: Allergy & Precautions, NPO status , Patient's Chart, lab work & pertinent test results  Airway Mallampati: II  TM Distance: >3 FB Neck ROM: Full    Dental no notable dental hx.    Pulmonary PE   Pulmonary exam normal        Cardiovascular hypertension, Pt. on medications and Pt. on home beta blockers + CAD and + CABG   Rhythm:Regular Rate:Normal     Neuro/Psych   Anxiety     negative neurological ROS     GI/Hepatic Neg liver ROS,GERD  Medicated,,  Endo/Other  negative endocrine ROS    Renal/GU   negative genitourinary   Musculoskeletal  (+) Arthritis , Osteoarthritis,    Abdominal Normal abdominal exam  (+)   Peds  Hematology Lab Results      Component                Value               Date                      WBC                      12.5 (H)            01/12/2023                HGB                      13.7                01/12/2023                HCT                      44.9                01/12/2023                MCV                      75.2 (L)            01/12/2023                PLT                      374                 01/12/2023             Lab Results      Component                Value               Date                      NA                       139                 01/12/2023                K  4.2                 01/12/2023                CO2                      27                  01/12/2023                GLUCOSE                  120 (H)             01/12/2023                BUN                      25 (H)              01/12/2023                CREATININE               0.95                01/12/2023                CALCIUM                  9.4                 01/12/2023                EGFR                     85                  07/09/2022                GFRNONAA                  >60                 01/12/2023              Anesthesia Other Findings   Reproductive/Obstetrics                             Anesthesia Physical Anesthesia Plan  ASA: 3  Anesthesia Plan: General   Post-op Pain Management:    Induction: Intravenous  PONV Risk Score and Plan: 2 and Ondansetron, Dexamethasone and Treatment may vary due to age or medical condition  Airway Management Planned: Mask and LMA  Additional Equipment: None  Intra-op Plan:   Post-operative Plan: Extubation in OR  Informed Consent: I have reviewed the patients History and Physical, chart, labs and discussed the procedure including the risks, benefits and alternatives for the proposed anesthesia with the patient or authorized representative who has indicated his/her understanding and acceptance.     Dental advisory given  Plan Discussed with: CRNA  Anesthesia Plan Comments:        Anesthesia Quick Evaluation

## 2023-01-14 NOTE — Brief Op Note (Signed)
01/14/2023  2:03 PM  PATIENT:  Terry [name]  72 y.o. male  PRE-OPERATIVE DIAGNOSIS:  RLE SEROMA  POST-OPERATIVE DIAGNOSIS:  RLE SEROMA  PROCEDURE:  IRRIGATION AND DEBRIDEMENT RIGHT LOWER EXTREMITY SEROMA, PLACEMENT OF WOUND VAC  SURGEON: Lightfoot, Eliezer Lofts, MD - Primary   ASSISTANTS: Isac Caddy, RN, Scrub Person         Christella Scheuermann, RN, Relief Circulator   ANESTHESIA:   general  EBL:  10 mL   BLOOD ADMINISTERED:none  DRAINS:  Wound vac    LOCAL MEDICATIONS USED:  NONE  COUNTS:  correct  DICTATION: .Dragon Dictation  PLAN OF CARE: Admit to inpatient   PATIENT DISPOSITION:  PACU - hemodynamically stable.   Delay start of Pharmacological VTE agent (>24hrs) due to surgical blood loss or risk of bleeding: no

## 2023-01-14 NOTE — Anesthesia Procedure Notes (Signed)
Procedure Name: LMA Insertion Date/Time: 01/14/2023 12:32 PM  Performed by: Nils Pyle, CRNAPre-anesthesia Checklist: Patient identified, Emergency Drugs available, Suction available and Patient being monitored Patient Re-evaluated:Patient Re-evaluated prior to induction Oxygen Delivery Method: Circle System Utilized Preoxygenation: Pre-oxygenation with 100% oxygen Induction Type: IV induction Ventilation: Mask ventilation without difficulty LMA: LMA inserted LMA Size: 4.0 Number of attempts: 1 Airway Equipment and Method: Bite block Placement Confirmation: positive ETCO2 Tube secured with: Tape Dental Injury: Teeth and Oropharynx as per pre-operative assessment

## 2023-01-14 NOTE — Telephone Encounter (Signed)
Patient wife discussed patient has wound vac on his leg and can he still get his injection for his cholesterol.  Advised that is fine, no issues

## 2023-01-15 ENCOUNTER — Ambulatory Visit (INDEPENDENT_AMBULATORY_CARE_PROVIDER_SITE_OTHER): Payer: Medicare Other | Admitting: Physician Assistant

## 2023-01-15 ENCOUNTER — Encounter (HOSPITAL_COMMUNITY): Payer: Self-pay | Admitting: Thoracic Surgery (Cardiothoracic Vascular Surgery)

## 2023-01-15 ENCOUNTER — Ambulatory Visit: Payer: Medicare Other | Admitting: Thoracic Surgery (Cardiothoracic Vascular Surgery)

## 2023-01-15 VITALS — Ht 68.0 in

## 2023-01-15 DIAGNOSIS — Z5189 Encounter for other specified aftercare: Secondary | ICD-10-CM

## 2023-01-15 LAB — SURGICAL PATHOLOGY

## 2023-01-15 NOTE — Progress Notes (Signed)
      [address]       [place] [address]             [phone]    HPI:  Patient is S/P Resection of Right Lower Extremity EVH harvest site seroma.  This was performed yesterday 8/16 by Dr. Cliffton Asters.  The patient contacted our office stating the wound vac placed was no longer working.  He states that he was up making a sandwich and the dressing just "fell off."  The pump was also turned off.  Current Outpatient Medications  Medication Sig Dispense Refill   acetaminophen (TYLENOL) 500 MG tablet Take 500-1,000 mg by mouth every 6 (six) hours as needed for moderate pain.     aspirin EC 81 MG tablet Take 1 tablet (81 mg total) by mouth daily. Swallow whole.     carbidopa-levodopa (SINEMET IR) 25-100 MG tablet Take 1 tablet by mouth 3 (three) times daily. 270 tablet 3   cetirizine (ZYRTEC) 10 MG tablet Take 10 mg by mouth daily as needed for allergies.     COLLAGEN PO Take 6 tablets by mouth daily.     cyanocobalamin (VITAMIN B12) 1000 MCG tablet Take 1,000 mcg by mouth daily.     donepezil (ARICEPT) 10 MG tablet Take 1 tablet (10 mg total) by mouth at bedtime. 90 tablet 3   gabapentin (NEURONTIN) 300 MG capsule Take 300 mg by mouth at bedtime as needed (pain).     inclisiran (LEQVIO) 284 MG/1.5ML SOSY injection Inject at 0, 3 months then q 6 months thereafter 1.5 mL 0   lidocaine (LIDODERM) 5 % Place 1 patch onto the skin daily as needed (pain).     Melatonin 10 MG CAPS Take 20 mg by mouth at bedtime.     metoprolol tartrate (LOPRESSOR) 25 MG tablet Take 0.5 tablets (12.5 mg total) by mouth 2 (two) times daily. 15 tablet 0   omeprazole (PRILOSEC OTC) 20 MG tablet Take 20 mg by mouth in the morning.     Polyvinyl Alcohol-Povidone PF (REFRESH) 1.4-0.6 % SOLN Place 1 drop into both eyes daily as needed (luberication and dry eyes).     rivaroxaban (XARELTO) 20 MG TABS tablet Take 20 mg by mouth daily with supper.     No current facility-administered medications for this  visit.    Ht 5\' 8"  (1.727 m)   BMI 24.18 kg/m   Right Lower Extremity EVH site with wound vac dressing place... the suction device from the pravena dressing has fallen off the wound.  The dressing was removed, surrounding skin cleaned and prepped with benzoin.  The dressing was replaced and new suction device was added to the wound.  The Pravena machine was still functioning and good seal was obtained.    RTC next Friday with Dr. Cliffton Asters for wound vac check.  He was given an additional cannister as there was a significant amount of serous drainage from the wound.... He will contact our office should there be any further problems  Lowella Dandy, PA-C [facility] [phone]

## 2023-01-15 NOTE — Anesthesia Postprocedure Evaluation (Signed)
Anesthesia Post Note  Patient: Terry [name]  Procedure(s) Performed: IRRIGATION AND DEBRIDEMENT RIGHT LOWER EXTREMITY SEROMA (Right)     Patient location during evaluation: PACU Anesthesia Type: General Level of consciousness: awake and alert Pain management: pain level controlled Vital Signs Assessment: post-procedure vital signs reviewed and stable Respiratory status: spontaneous breathing, nonlabored ventilation, respiratory function stable and patient connected to nasal cannula oxygen Cardiovascular status: blood pressure returned to baseline and stable Postop Assessment: no apparent nausea or vomiting Anesthetic complications: no   No notable events documented.  Last Vitals:  Vitals:   01/14/23 1515 01/14/23 1516  BP: 127/85   Pulse: (!) 56 (!) 56  Resp: 13 15  Temp:  37.1 C  SpO2: 94% 94%    Last Pain:  Vitals:   01/14/23 1406  PainSc: 0-No pain                 Earl Lites P Montague Corella

## 2023-01-18 ENCOUNTER — Ambulatory Visit (INDEPENDENT_AMBULATORY_CARE_PROVIDER_SITE_OTHER): Payer: Self-pay

## 2023-01-18 VITALS — BP 155/84 | HR 54 | Temp 98.2°F | Resp 16 | Ht 68.0 in | Wt 157.6 lb

## 2023-01-18 DIAGNOSIS — E782 Mixed hyperlipidemia: Secondary | ICD-10-CM | POA: Diagnosis not present

## 2023-01-18 DIAGNOSIS — I2510 Atherosclerotic heart disease of native coronary artery without angina pectoris: Secondary | ICD-10-CM

## 2023-01-18 MED ORDER — INCLISIRAN SODIUM 284 MG/1.5ML ~~LOC~~ SOSY
284.0000 mg | PREFILLED_SYRINGE | Freq: Once | SUBCUTANEOUS | Status: AC
  Administered 2023-01-18: 284 mg via SUBCUTANEOUS
  Filled 2023-01-18: qty 1.5

## 2023-01-18 NOTE — Progress Notes (Signed)
Diagnosis: Hyperlipidemia  Provider:  Mannam, Praveen MD  Procedure: Injection  Leqvio (inclisiran), Dose: 284 mg, Site: subcutaneous, Number of injections: 1  Post Care:     Discharge: Condition: Good, Destination: Home . AVS Provided  Performed by:  Sabitri  Ranabhat, RN       

## 2023-01-22 ENCOUNTER — Encounter: Payer: Self-pay | Admitting: Thoracic Surgery (Cardiothoracic Vascular Surgery)

## 2023-01-22 ENCOUNTER — Ambulatory Visit: Payer: Medicare Other | Admitting: Thoracic Surgery (Cardiothoracic Vascular Surgery)

## 2023-01-22 VITALS — BP 136/79 | HR 59 | Resp 20 | Ht 68.0 in | Wt 159.0 lb

## 2023-01-22 DIAGNOSIS — Z5189 Encounter for other specified aftercare: Secondary | ICD-10-CM

## 2023-01-22 NOTE — Progress Notes (Signed)
     [address]       [place] [address]             [phone]       Wound VAC was changed at the bedside.  The mesh was left in place.  He will follow-up with me for another wound VAC change.

## 2023-01-27 ENCOUNTER — Ambulatory Visit: Payer: Medicare Other | Admitting: Physician Assistant

## 2023-01-27 VITALS — BP 124/77 | HR 70 | Resp 20 | Ht 68.0 in

## 2023-01-27 DIAGNOSIS — Z5189 Encounter for other specified aftercare: Secondary | ICD-10-CM

## 2023-01-27 NOTE — Progress Notes (Signed)
[address]       [place] [address]             [phone]   HPI: This is a 72 year old who is s/p CABG x 3 and LA clip by Dr. Delia Chimes on 08/04/2022. He was discharged to [facility] on 08/10/2022. He was discharged to home on 08/18/2022. He was found to have a RLE seroma. Dr. Cliffton Asters then did an incision and drainage of right lower extremity seroma, resection and debridement of seroma capsule, placement of 500mg  myriad morcels, placement of 5X5cm myriad mesh matrix, and placement of Prevena wound vac on 01/14/2023. He was last seen by Dr. Cliffton Asters in the office on 01/22/2023 and the wound VAC was changed. He has had issues with copious drainage and stoppage of vac. He presents today for increased drainage (bleeding) from right thigh Prevena.  Current Outpatient Medications  Medication Sig Dispense Refill   acetaminophen (TYLENOL) 500 MG tablet Take 500-1,000 mg by mouth every 6 (six) hours as needed for moderate pain.     aspirin EC 81 MG tablet Take 1 tablet (81 mg total) by mouth daily. Swallow whole.     carbidopa-levodopa (SINEMET IR) 25-100 MG tablet Take 1 tablet by mouth 3 (three) times daily. 270 tablet 3   cetirizine (ZYRTEC) 10 MG tablet Take 10 mg by mouth daily as needed for allergies.     COLLAGEN PO Take 6 tablets by mouth daily.     cyanocobalamin (VITAMIN B12) 1000 MCG tablet Take 1,000 mcg by mouth daily.     donepezil (ARICEPT) 10 MG tablet Take 1 tablet (10 mg total) by mouth at bedtime. 90 tablet 3   gabapentin (NEURONTIN) 300 MG capsule Take 300 mg by mouth at bedtime as needed (pain).     inclisiran (LEQVIO) 284 MG/1.5ML SOSY injection Inject at 0, 3 months then q 6 months thereafter 1.5 mL 0   lidocaine (LIDODERM) 5 % Place 1 patch onto the skin daily as needed (pain).     Melatonin 10 MG CAPS Take 20 mg by mouth at bedtime.     metoprolol tartrate (LOPRESSOR) 25 MG tablet Take 0.5 tablets (12.5 mg total) by mouth 2 (two) times daily. 15 tablet 0    omeprazole (PRILOSEC OTC) 20 MG tablet Take 20 mg by mouth in the morning.     Polyvinyl Alcohol-Povidone PF (REFRESH) 1.4-0.6 % SOLN Place 1 drop into both eyes daily as needed (luberication and dry eyes).     rivaroxaban (XARELTO) 20 MG TABS tablet Take 20 mg by mouth daily with supper.    Vital Signs: Vitals:   01/27/23 1345  BP: 124/77  Pulse: 70  Resp: 20  SpO2: 95%      Physical Exam: CV-RRR Pulmonary-Clear to auscultation bilaterally Wounds-Prevena vac removed from right thigh. Lidocaine spray and Lidocaine injected and used around outside of wound. Betadine then used as antiseptic around wound. Sorbact removed. Silver nitrate used on several areas of bleeding. Several Nylon sutures also placed for hemostasis. There was barely any bleeding afterward. The wound is clean, dry, no purulence noted. Vashe put on 4x4s and wound packed. Kerlex and ace applied. Patient tolerated procedure well. Dr. Donata Clay performed above.  Impression and Plan: Patient instructed to resume baby ec asa 81 mg in the am 08/29. He is not to take any Xarelto this evening. Patient instructed how to reinforce dressing, if needed, over night. Patient instructed to keep RLE elevated as much as  possible when not ambulating. He will be seen again in the am for wound check with Dr. Donata Clay.    Ardelle Balls, PA-C [facility] [phone]

## 2023-01-28 ENCOUNTER — Other Ambulatory Visit: Payer: Self-pay

## 2023-01-28 ENCOUNTER — Ambulatory Visit (INDEPENDENT_AMBULATORY_CARE_PROVIDER_SITE_OTHER): Payer: Medicare Other | Admitting: Cardiothoracic Surgery

## 2023-01-28 ENCOUNTER — Encounter: Payer: Self-pay | Admitting: Cardiothoracic Surgery

## 2023-01-28 VITALS — BP 133/77 | HR 62 | Resp 20 | Ht 68.0 in | Wt 159.0 lb

## 2023-01-28 DIAGNOSIS — T8130XA Disruption of wound, unspecified, initial encounter: Secondary | ICD-10-CM

## 2023-01-28 DIAGNOSIS — Z951 Presence of aortocoronary bypass graft: Secondary | ICD-10-CM

## 2023-01-28 DIAGNOSIS — Z5189 Encounter for other specified aftercare: Secondary | ICD-10-CM

## 2023-01-28 DIAGNOSIS — I2510 Atherosclerotic heart disease of native coronary artery without angina pectoris: Secondary | ICD-10-CM

## 2023-01-28 MED ORDER — TRAMADOL HCL 50 MG PO TABS: 50.0000 mg | ORAL_TABLET | Freq: Four times a day (QID) | ORAL | 0 refills | Status: AC | PRN

## 2023-01-28 NOTE — Progress Notes (Signed)
HPI: The patient returns for wound care of the right mid thigh.  He presented to the office yesterday with bleeding from a recent surgical debridement of a large seroma resolving after Endo vein harvest for CABG 6 months ago by Dr. Delia Chimes.  A large thrombus informed probably related to his postop Xarelto dosing.  2 transfixion sutures were needed to stop the skin/subcutaneous tissue bleeding and the wound was packed with Vashe wet-to-dry.  He has been holding his Xarelto.  Today the wound was reexamined after unwrapping the Kerlix and Ace wrap.  There is no active bleeding.  Wound is clean.  The wound measures 5 cm x 4 cm extending into the medial adductor muscle, and was repacked with 4 x 4 Vashe wet-to-dry and wrapped with Kerlix and Ace wrap.  The patient will return to the clinic tomorrow for wound care. The patient will keep the Xarelto on hold another day. A request for home health nursing and wound care was placed.  The wound will need daily packing with wet-to-dry Vashe gauze.  The patient will be instructed on wound care at home and we will follow along with wound care in the clinic as well.  Hopefully home health nurse will be able to engage in this process soon.  Current Outpatient Medications  Medication Sig Dispense Refill   acetaminophen (TYLENOL) 500 MG tablet Take 500-1,000 mg by mouth every 6 (six) hours as needed for moderate pain.     aspirin EC 81 MG tablet Take 1 tablet (81 mg total) by mouth daily. Swallow whole.     carbidopa-levodopa (SINEMET IR) 25-100 MG tablet Take 1 tablet by mouth 3 (three) times daily. 270 tablet 3   cetirizine (ZYRTEC) 10 MG tablet Take 10 mg by mouth daily as needed for allergies.     COLLAGEN PO Take 6 tablets by mouth daily.     cyanocobalamin (VITAMIN B12) 1000 MCG tablet Take 1,000 mcg by mouth daily.     donepezil (ARICEPT) 10 MG tablet Take 1 tablet (10 mg total) by mouth at bedtime. 90 tablet 3   gabapentin (NEURONTIN) 300 MG capsule Take 300  mg by mouth at bedtime as needed (pain).     inclisiran (LEQVIO) 284 MG/1.5ML SOSY injection Inject at 0, 3 months then q 6 months thereafter 1.5 mL 0   lidocaine (LIDODERM) 5 % Place 1 patch onto the skin daily as needed (pain).     Melatonin 10 MG CAPS Take 20 mg by mouth at bedtime.     metoprolol tartrate (LOPRESSOR) 25 MG tablet Take 0.5 tablets (12.5 mg total) by mouth 2 (two) times daily. 15 tablet 0   omeprazole (PRILOSEC OTC) 20 MG tablet Take 20 mg by mouth in the morning.     Polyvinyl Alcohol-Povidone PF (REFRESH) 1.4-0.6 % SOLN Place 1 drop into both eyes daily as needed (luberication and dry eyes).     rivaroxaban (XARELTO) 20 MG TABS tablet Take 20 mg by mouth daily with supper.     No current facility-administered medications for this visit.     Physical Exam: Blood pressure 133/77, pulse 62, resp. rate 20, height 5\' 8"  (1.727 m), weight 159 lb (72.1 kg), SpO2 97%.   Alert and comfortable Lungs clear Heart rate regular No peripheral edema Right mid thigh surgical wound clean without active bleeding Neuro intact    Diagnostic Tests: None  Impression: Status post right leg debridement for a large postoperative seroma.  Transition from wound VAC therapy to daily wet-to-dry  Vashe dressings necessitated by significant bleeding in the bed of the wound.  Plan: Daily wet-to-dry dressing changes Hold Xarelto for now Patient unable to sleep because of pain from the wound so we will call in some tramadol. Patient will return to the clinic tomorrow for wound care.  Lovett Sox, MD [facility] [phone]

## 2023-01-29 ENCOUNTER — Other Ambulatory Visit: Payer: Self-pay

## 2023-01-29 ENCOUNTER — Ambulatory Visit (INDEPENDENT_AMBULATORY_CARE_PROVIDER_SITE_OTHER): Payer: Medicare Other | Admitting: Cardiothoracic Surgery

## 2023-01-29 ENCOUNTER — Encounter: Payer: Self-pay | Admitting: Cardiothoracic Surgery

## 2023-01-29 VITALS — BP 149/86 | HR 58 | Resp 20 | Ht 68.0 in | Wt 159.0 lb

## 2023-01-29 DIAGNOSIS — Z951 Presence of aortocoronary bypass graft: Secondary | ICD-10-CM

## 2023-01-29 DIAGNOSIS — T8130XA Disruption of wound, unspecified, initial encounter: Secondary | ICD-10-CM

## 2023-01-29 DIAGNOSIS — Z4800 Encounter for change or removal of nonsurgical wound dressing: Secondary | ICD-10-CM

## 2023-01-29 MED ORDER — CEPHALEXIN 500 MG PO CAPS: 500.0000 mg | ORAL_CAPSULE | Freq: Three times a day (TID) | ORAL | 0 refills | Status: DC

## 2023-01-29 NOTE — Progress Notes (Signed)
HPI: The patient returns for a dressing change of his right thigh wound. There has been no more bleeding. He is still off anticoagulation. I inspected and personally repacked the wound in his right thigh.  There is minimal granulation tissue but no evidence of thrombus.  There is some evidence of fat necrosis and he will be started on oral Keflex. I placed two 4 x 4 gauze with Vashe wet-to-dry and a Curlex-Ace wrap on the leg.  Home health nurse resources have been requested and the order is being processed.  Current Outpatient Medications  Medication Sig Dispense Refill   cephALEXin (KEFLEX) 500 MG capsule Take 1 capsule (500 mg total) by mouth 3 (three) times daily. 21 capsule 0   acetaminophen (TYLENOL) 500 MG tablet Take 500-1,000 mg by mouth every 6 (six) hours as needed for moderate pain.     aspirin EC 81 MG tablet Take 1 tablet (81 mg total) by mouth daily. Swallow whole.     carbidopa-levodopa (SINEMET IR) 25-100 MG tablet Take 1 tablet by mouth 3 (three) times daily. 270 tablet 3   cetirizine (ZYRTEC) 10 MG tablet Take 10 mg by mouth daily as needed for allergies.     COLLAGEN PO Take 6 tablets by mouth daily.     cyanocobalamin (VITAMIN B12) 1000 MCG tablet Take 1,000 mcg by mouth daily.     donepezil (ARICEPT) 10 MG tablet Take 1 tablet (10 mg total) by mouth at bedtime. 90 tablet 3   gabapentin (NEURONTIN) 300 MG capsule Take 300 mg by mouth at bedtime as needed (pain).     inclisiran (LEQVIO) 284 MG/1.5ML SOSY injection Inject at 0, 3 months then q 6 months thereafter 1.5 mL 0   lidocaine (LIDODERM) 5 % Place 1 patch onto the skin daily as needed (pain).     Melatonin 10 MG CAPS Take 20 mg by mouth at bedtime.     metoprolol tartrate (LOPRESSOR) 25 MG tablet Take 0.5 tablets (12.5 mg total) by mouth 2 (two) times daily. 15 tablet 0   omeprazole (PRILOSEC OTC) 20 MG tablet Take 20 mg by mouth in the morning.     Polyvinyl Alcohol-Povidone PF (REFRESH) 1.4-0.6 % SOLN Place 1 drop  into both eyes daily as needed (luberication and dry eyes).     rivaroxaban (XARELTO) 20 MG TABS tablet Take 20 mg by mouth daily with supper.     traMADol (ULTRAM) 50 MG tablet Take 1 tablet (50 mg total) by mouth every 6 (six) hours as needed. 28 tablet 0   No current facility-administered medications for this visit.     Physical Exam: Blood pressure (!) 149/86, pulse (!) 58, resp. rate 20, height 5\' 8"  (1.727 m), weight 159 lb (72.1 kg), SpO2 98%.  Alert and comfortable Regular rhythm Lungs clear Neuro intact Right mid thigh wound clean and dressing changed, minimal granulation tissue noted.  Diagnostic Tests: Wound culture taken and submitted today from right leg wound  Impression: Right leg wound clean, not bleeding, will continue daily Vashe wet-to-dry dressing changes.  Patient instructed on wound care.  Plan: Return as scheduled for dressing change wound care. Start Keflex 500 mg p.o. 3 times daily for 1 week.  Lovett Sox, MD [facility] [phone]

## 2023-01-30 DIAGNOSIS — I2510 Atherosclerotic heart disease of native coronary artery without angina pectoris: Secondary | ICD-10-CM | POA: Diagnosis not present

## 2023-01-30 DIAGNOSIS — Z7982 Long term (current) use of aspirin: Secondary | ICD-10-CM | POA: Diagnosis not present

## 2023-01-30 DIAGNOSIS — Z7901 Long term (current) use of anticoagulants: Secondary | ICD-10-CM | POA: Diagnosis not present

## 2023-01-30 DIAGNOSIS — I97641 Postprocedural seroma of a circulatory system organ or structure following cardiac bypass: Secondary | ICD-10-CM | POA: Diagnosis not present

## 2023-01-31 ENCOUNTER — Other Ambulatory Visit: Payer: Self-pay | Admitting: Physician Assistant

## 2023-02-01 LAB — WOUND CULTURE
MICRO NUMBER:: 15405673
SPECIMEN QUALITY:: ADEQUATE

## 2023-02-02 ENCOUNTER — Encounter: Payer: Self-pay | Admitting: Cardiothoracic Surgery

## 2023-02-02 ENCOUNTER — Ambulatory Visit (INDEPENDENT_AMBULATORY_CARE_PROVIDER_SITE_OTHER): Payer: Medicare Other | Admitting: Cardiothoracic Surgery

## 2023-02-02 VITALS — BP 150/88 | HR 69 | Resp 20 | Ht 68.0 in | Wt 154.0 lb

## 2023-02-02 DIAGNOSIS — Z951 Presence of aortocoronary bypass graft: Secondary | ICD-10-CM

## 2023-02-02 DIAGNOSIS — T8130XA Disruption of wound, unspecified, initial encounter: Secondary | ICD-10-CM

## 2023-02-02 NOTE — Progress Notes (Signed)
HPI: The patient returns for wound care of the R thigh open wound. Had debridement of a seroma following CABG x3 March 2024.  Myriad -wound VAC closure failed due to large hematoma forming due to post op Xarelto.  Now wound care is daily wet/dry packing with Vashe down to adductor muscle.Wound not infected but finishing one course of Keflex HHN has been ordered but not yet started.\  I examined and repacked the wound in clinic today.  Current Outpatient Medications  Medication Sig Dispense Refill   acetaminophen (TYLENOL) 500 MG tablet Take 500-1,000 mg by mouth every 6 (six) hours as needed for moderate pain.     aspirin EC 81 MG tablet Take 1 tablet (81 mg total) by mouth daily. Swallow whole.     carbidopa-levodopa (SINEMET IR) 25-100 MG tablet Take 1 tablet by mouth 3 (three) times daily. 270 tablet 3   cephALEXin (KEFLEX) 500 MG capsule Take 1 capsule (500 mg total) by mouth 3 (three) times daily. 21 capsule 0   cetirizine (ZYRTEC) 10 MG tablet Take 10 mg by mouth daily as needed for allergies.     COLLAGEN PO Take 6 tablets by mouth daily.     cyanocobalamin (VITAMIN B12) 1000 MCG tablet Take 1,000 mcg by mouth daily.     donepezil (ARICEPT) 10 MG tablet Take 1 tablet (10 mg total) by mouth at bedtime. 90 tablet 3   gabapentin (NEURONTIN) 300 MG capsule Take 300 mg by mouth at bedtime as needed (pain).     inclisiran (LEQVIO) 284 MG/1.5ML SOSY injection Inject at 0, 3 months then q 6 months thereafter 1.5 mL 0   lidocaine (LIDODERM) 5 % Place 1 patch onto the skin daily as needed (pain).     Melatonin 10 MG CAPS Take 20 mg by mouth at bedtime.     metoprolol tartrate (LOPRESSOR) 25 MG tablet Take 0.5 tablets (12.5 mg total) by mouth 2 (two) times daily. 15 tablet 0   omeprazole (PRILOSEC OTC) 20 MG tablet Take 20 mg by mouth in the morning.     Polyvinyl Alcohol-Povidone PF (REFRESH) 1.4-0.6 % SOLN Place 1 drop into both eyes daily as needed (luberication and dry eyes).      rivaroxaban (XARELTO) 20 MG TABS tablet Take 20 mg by mouth daily with supper.     traMADol (ULTRAM) 50 MG tablet Take 1 tablet (50 mg total) by mouth every 6 (six) hours as needed. 28 tablet 0   No current facility-administered medications for this visit.     Physical Exam: Blood pressure (!) 150/88, pulse 69, resp. rate 20, height 5\' 8"  (1.727 m), weight 154 lb (69.9 kg), SpO2 96%. \      Exam    General- alert and comfortable    Neck- no JVD, no cervical adenopathy palpable, no carotid bruit   Lungs- clear without rales, wheezes   Cor- regular rate and rhythm, no murmur , gallop   Abdomen- soft, non-tender   Extremities - warm, non-tender, minimal edema    R mid thigh wound 5cmx3cm, clean but minimal granulation tissue   Neuro- oriented, appropriate, no focal weakness    Diagnostic Tests: none  Impression: Wound clean, cont daily packing  Plan: Return for wound care 01-04-23 Hold Xarelto because of bleeding risk from wound  Lovett Sox, MD [facility] [phone]

## 2023-02-03 DIAGNOSIS — Z7982 Long term (current) use of aspirin: Secondary | ICD-10-CM | POA: Diagnosis not present

## 2023-02-03 DIAGNOSIS — Z7901 Long term (current) use of anticoagulants: Secondary | ICD-10-CM | POA: Diagnosis not present

## 2023-02-03 DIAGNOSIS — I2510 Atherosclerotic heart disease of native coronary artery without angina pectoris: Secondary | ICD-10-CM | POA: Diagnosis not present

## 2023-02-03 DIAGNOSIS — I97641 Postprocedural seroma of a circulatory system organ or structure following cardiac bypass: Secondary | ICD-10-CM | POA: Diagnosis not present

## 2023-02-04 ENCOUNTER — Ambulatory Visit (INDEPENDENT_AMBULATORY_CARE_PROVIDER_SITE_OTHER): Payer: Medicare Other | Admitting: Cardiothoracic Surgery

## 2023-02-04 ENCOUNTER — Encounter: Payer: Self-pay | Admitting: Cardiothoracic Surgery

## 2023-02-04 DIAGNOSIS — Z951 Presence of aortocoronary bypass graft: Secondary | ICD-10-CM

## 2023-02-04 NOTE — Progress Notes (Signed)
HPI: The patient presents for scheduled wound care of the right thigh wound status postdebridement.  Wound cultures taken at last visit are negative.  He will finish his current course of oral Keflex. I inspected and personally repacked the wound today in the office. Wound is now 100% clean granulation tissue.  There is minimal lymphatic drainage.  There is no purulence.  I removed the final suture which was placed when he was having active bleeding while on Xarelto.  Wound is also starting to contract.  I placed a new 4 x 4 Vashe wet-to-dry dressing followed by Kerlix and Ace wrap.  Continue daily Vashe wet-to-dry dressing changes.  Hold on showering for now.  Return in 4 days for wound check and wound care.  Current Outpatient Medications  Medication Sig Dispense Refill   acetaminophen (TYLENOL) 500 MG tablet Take 500-1,000 mg by mouth every 6 (six) hours as needed for moderate pain.     aspirin EC 81 MG tablet Take 1 tablet (81 mg total) by mouth daily. Swallow whole.     carbidopa-levodopa (SINEMET IR) 25-100 MG tablet Take 1 tablet by mouth 3 (three) times daily. 270 tablet 3   cetirizine (ZYRTEC) 10 MG tablet Take 10 mg by mouth daily as needed for allergies.     COLLAGEN PO Take 6 tablets by mouth daily.     cyanocobalamin (VITAMIN B12) 1000 MCG tablet Take 1,000 mcg by mouth daily.     donepezil (ARICEPT) 10 MG tablet Take 1 tablet (10 mg total) by mouth at bedtime. 90 tablet 3   gabapentin (NEURONTIN) 300 MG capsule Take 300 mg by mouth at bedtime as needed (pain).     inclisiran (LEQVIO) 284 MG/1.5ML SOSY injection Inject at 0, 3 months then q 6 months thereafter 1.5 mL 0   lidocaine (LIDODERM) 5 % Place 1 patch onto the skin daily as needed (pain).     Melatonin 10 MG CAPS Take 20 mg by mouth at bedtime.     metoprolol tartrate (LOPRESSOR) 25 MG tablet Take 0.5 tablets (12.5 mg total) by mouth 2 (two) times daily. 15 tablet 0   omeprazole (PRILOSEC OTC) 20 MG tablet Take 20 mg by mouth  in the morning.     Polyvinyl Alcohol-Povidone PF (REFRESH) 1.4-0.6 % SOLN Place 1 drop into both eyes daily as needed (luberication and dry eyes).     rivaroxaban (XARELTO) 20 MG TABS tablet Take 20 mg by mouth daily with supper.     traMADol (ULTRAM) 50 MG tablet Take 1 tablet (50 mg total) by mouth every 6 (six) hours as needed. 28 tablet 0   No current facility-administered medications for this visit.     Physical Exam: Blood pressure (!) 152/80, pulse 62, resp. rate 20, SpO2 98%.  Wound clean measuring 5 x 3 cm.  Starting to show some contraction and shallowing. Normal sinus rhythm Good peripheral perfusion Lungs clear Neuro intact  Diagnostic Tests: None  Impression: Right thigh wound is clean and granulating.  Continue current care.  Plan: Return on September 9 for afternoon clinic appointment and wound care.  The patient will continue Vashe wet-to-dry dressings at home.   Lovett Sox, MD [facility] [phone]

## 2023-02-04 NOTE — Patient Instructions (Signed)
8

## 2023-02-05 ENCOUNTER — Ambulatory Visit: Payer: Medicare Other | Admitting: Thoracic Surgery (Cardiothoracic Vascular Surgery)

## 2023-02-07 DIAGNOSIS — Z7982 Long term (current) use of aspirin: Secondary | ICD-10-CM | POA: Diagnosis not present

## 2023-02-07 DIAGNOSIS — Z7901 Long term (current) use of anticoagulants: Secondary | ICD-10-CM | POA: Diagnosis not present

## 2023-02-07 DIAGNOSIS — I97641 Postprocedural seroma of a circulatory system organ or structure following cardiac bypass: Secondary | ICD-10-CM | POA: Diagnosis not present

## 2023-02-07 DIAGNOSIS — I2510 Atherosclerotic heart disease of native coronary artery without angina pectoris: Secondary | ICD-10-CM | POA: Diagnosis not present

## 2023-02-08 ENCOUNTER — Encounter: Payer: Self-pay | Admitting: Cardiothoracic Surgery

## 2023-02-08 ENCOUNTER — Ambulatory Visit (INDEPENDENT_AMBULATORY_CARE_PROVIDER_SITE_OTHER): Payer: Medicare Other | Admitting: Cardiothoracic Surgery

## 2023-02-08 VITALS — BP 124/73 | HR 63 | Resp 20 | Ht 68.0 in | Wt 154.0 lb

## 2023-02-08 DIAGNOSIS — Z5189 Encounter for other specified aftercare: Secondary | ICD-10-CM

## 2023-02-08 DIAGNOSIS — T148XXA Other injury of unspecified body region, initial encounter: Secondary | ICD-10-CM | POA: Insufficient documentation

## 2023-02-08 NOTE — Progress Notes (Signed)
HPI Patient returns for scheduled wound care of his right mid thigh wound.  He had debridement of a large seroma following vein harvest for CABG March 2024.  A wound VAC was used temporarily until he developed bleeding associated with Xarelto and now he has been packing a clean wound with Vashe wound solution wet-to-dry daily.  A home health nurse is now involved in his care. The wound appears to be healing satisfactorily and there has been no more bleeding-the Xarelto is on hold.  I personally changed the dressing today with one 4 x 4 gauze soaked in Vashe wound solution wet-to-dry with a Kerlix wrap and Ace wrap.  The wound is shallowing and is 100% granulation tissue.  There  still may be some mild lymphatic drainage.  Cultures have been negative.  Current Outpatient Medications  Medication Sig Dispense Refill   acetaminophen (TYLENOL) 500 MG tablet Take 500-1,000 mg by mouth every 6 (six) hours as needed for moderate pain.     aspirin EC 81 MG tablet Take 1 tablet (81 mg total) by mouth daily. Swallow whole.     carbidopa-levodopa (SINEMET IR) 25-100 MG tablet Take 1 tablet by mouth 3 (three) times daily. 270 tablet 3   cetirizine (ZYRTEC) 10 MG tablet Take 10 mg by mouth daily as needed for allergies.     COLLAGEN PO Take 6 tablets by mouth daily.     cyanocobalamin (VITAMIN B12) 1000 MCG tablet Take 1,000 mcg by mouth daily.     donepezil (ARICEPT) 10 MG tablet Take 1 tablet (10 mg total) by mouth at bedtime. 90 tablet 3   gabapentin (NEURONTIN) 300 MG capsule Take 300 mg by mouth at bedtime as needed (pain).     inclisiran (LEQVIO) 284 MG/1.5ML SOSY injection Inject at 0, 3 months then q 6 months thereafter 1.5 mL 0   lidocaine (LIDODERM) 5 % Place 1 patch onto the skin daily as needed (pain).     Melatonin 10 MG CAPS Take 20 mg by mouth at bedtime.     metoprolol tartrate (LOPRESSOR) 25 MG tablet Take 0.5 tablets (12.5 mg total) by mouth 2 (two) times daily. 15 tablet 0   omeprazole  (PRILOSEC OTC) 20 MG tablet Take 20 mg by mouth in the morning.     Polyvinyl Alcohol-Povidone PF (REFRESH) 1.4-0.6 % SOLN Place 1 drop into both eyes daily as needed (luberication and dry eyes).     rivaroxaban (XARELTO) 20 MG TABS tablet Take 20 mg by mouth daily with supper.     traMADol (ULTRAM) 50 MG tablet Take 1 tablet (50 mg total) by mouth every 6 (six) hours as needed. 28 tablet 0   No current facility-administered medications for this visit.     Review of Systems: No fever Appetite good Not much pain  Physical Exam Blood pressure 124/73, pulse 63, resp. rate 20, height 5\' 8"  (1.727 m), weight 154 lb (69.9 kg), SpO2 97%.         Exam    General- alert and comfortable    Neck- no JVD, no cervical adenopathy palpable, no carotid bruit   Lungs- clear without rales, wheezes   Cor- regular rate and rhythm, no murmur , gallop   Abdomen- soft, non-tender   Extremities - warm, non-tender, minimal edema.  Right thigh wound dressing changed as noted above   Neuro- oriented, appropriate, no focal weakness  Diagnostic Tests:   Impression: Postdebridement wound healing by secondary intent at the site of a seroma following vein  harvest.  Plan: Continue daily wet-to-dry dressing changes with Vashe wound solution Weekly clinic wound care to follow progress    Lovett Sox, MD [facility] [phone]

## 2023-02-09 ENCOUNTER — Encounter: Payer: Self-pay | Admitting: Cardiovascular Disease

## 2023-02-09 ENCOUNTER — Ambulatory Visit: Payer: Medicare Other | Attending: Cardiovascular Disease | Admitting: Cardiovascular Disease

## 2023-02-09 VITALS — BP 128/72 | HR 60 | Ht 68.0 in | Wt 154.8 lb

## 2023-02-09 DIAGNOSIS — E782 Mixed hyperlipidemia: Secondary | ICD-10-CM

## 2023-02-09 DIAGNOSIS — I10 Essential (primary) hypertension: Secondary | ICD-10-CM

## 2023-02-09 DIAGNOSIS — I2782 Chronic pulmonary embolism: Secondary | ICD-10-CM

## 2023-02-09 DIAGNOSIS — Z951 Presence of aortocoronary bypass graft: Secondary | ICD-10-CM

## 2023-02-09 NOTE — Assessment & Plan Note (Signed)
History of remote pulmonary embolism remaining on Xarelto oral anticoagulation.

## 2023-02-09 NOTE — Assessment & Plan Note (Signed)
History of hyperlipidemia on inclisiran with lipid profile performed 12/10/2022 revealing a total cholesterol 68, LDL 0 and HDL of 46.

## 2023-02-09 NOTE — Assessment & Plan Note (Signed)
History of CAD status post coronary CTA performed 06/24/2022 showing a coronary calcium score of 507 with multivessel disease.  This led to cardiac cath by myself 07/20/2022 revealed LAD diagonal branch bifurcation disease as well as severe RCA disease.  He ultimately underwent CABG x 3 by Dr. Delia Chimes  on 08/04/2022 with LIMA to his LAD, vein to diagonal branch and PDA.  He has recuperated nicely.  He participate in cardiac rehab program in [facility].  He has had wound dehiscence in his right leg and has had removal of the seroma by Dr. Cliffton Asters recently.

## 2023-02-09 NOTE — Assessment & Plan Note (Signed)
History of essential hypertension blood pressure measured today at 128/72. He is on metoprolol.

## 2023-02-09 NOTE — Patient Instructions (Signed)
Medication Instructions:  Your physician recommends that you continue on your current medications as directed. Please refer to the Current Medication list given to you today.  *If you need a refill on your cardiac medications before your next appointment, please call your pharmacy*   Follow-Up: At [facility], you and your health needs are our priority.  As part of our continuing mission to provide you with exceptional heart care, we have created designated Provider Care Teams.  These Care Teams include your primary Cardiologist (physician) and Advanced Practice Providers (APPs -  Physician Assistants and Nurse Practitioners) who all work together to provide you with the care you need, when you need it.  We recommend signing up for the patient portal called "MyChart".  Sign up information is provided on this After Visit Summary.  MyChart is used to connect with patients for Virtual Visits (Telemedicine).  Patients are able to view lab/test results, encounter notes, upcoming appointments, etc.  Non-urgent messages can be sent to your provider as well.   To learn more about what you can do with MyChart, go to [email or web address].    Your next appointment:   6 month(s)  Provider:   Diona Browner, NP      Then, Quay Burow, MD will plan to see you again in 12 month(s).

## 2023-02-09 NOTE — Progress Notes (Signed)
02/09/2023 Marvene Staff   [birth year]  [id number]  Primary Physician Creola Corn, MD Primary Cardiologist: Runell Gess MD Nicholes Calamity, [place]  HPI:  Terry [name] is a 72 y.o.    thin and fit appearing married Caucasian male father of 2 children, grandfather of 2 grandchildren referred by Dr. Marina Goodell, gastroenterology, for preoperative clearance before his scheduled endoscopy.  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  I last saw him in the office 07/17/2022.  He has no cardiac risk factors other than family history with a father that died at age 45 of myocardial infarction.  He is never had a heart attack or stroke.  He did have a pulmonary embolism 5 years ago and has been on Xarelto since.  He was an active runner for a long time and has had multiple back surgeries most recently by Dr. Danielle Dess 6 weeks ago for which she stopped his Xarelto 2 days before.  He is very active but has complained of some exertional chest tightness.   I got a coronary CTA on 07/15/2022 revealing a coronary calcium score of 507 with significant disease in his RCA by FFR analysis.  Based on this, we decided to proceed with outpatient radial diagnostic coronary angiography.  I performed outpatient radial diagnostic cath on him 07/20/2022 revealing high-grade LAD diagonal branch bifurcation disease and high-grade dominant RCA disease in the proximal mid and distal portion.  Based on this I thought surgical revascularization was the best option.  He ultimately underwent CABG x 3 by Dr. Delia Chimes on 08/04/2022 with a LIMA to his LAD, vein to diagonal branch and PDA.  He finished cardiac rehab in [facility].  His major complication has been wound dehiscence and seroma in his right leg at the vein graft harvest site recently operated on by Dr. Cliffton Asters.   Current Meds  Medication Sig   acetaminophen (TYLENOL) 500 MG tablet Take 500-1,000 mg by mouth every 6 (six) hours as needed for moderate  pain.   aspirin EC 81 MG tablet Take 1 tablet (81 mg total) by mouth daily. Swallow whole.   carbidopa-levodopa (SINEMET IR) 25-100 MG tablet Take 1 tablet by mouth 3 (three) times daily.   cetirizine (ZYRTEC) 10 MG tablet Take 10 mg by mouth daily as needed for allergies.   COLLAGEN PO Take 6 tablets by mouth daily.   cyanocobalamin (VITAMIN B12) 1000 MCG tablet Take 1,000 mcg by mouth daily.   donepezil (ARICEPT) 10 MG tablet Take 1 tablet (10 mg total) by mouth at bedtime.   gabapentin (NEURONTIN) 300 MG capsule Take 300 mg by mouth at bedtime as needed (pain).   inclisiran (LEQVIO) 284 MG/1.5ML SOSY injection Inject at 0, 3 months then q 6 months thereafter   lidocaine (LIDODERM) 5 % Place 1 patch onto the skin daily as needed (pain).   Melatonin 10 MG CAPS Take 20 mg by mouth at bedtime.   metoprolol tartrate (LOPRESSOR) 25 MG tablet Take 0.5 tablets (12.5 mg total) by mouth 2 (two) times daily.   omeprazole (PRILOSEC OTC) 20 MG tablet Take 20 mg by mouth in the morning.   Polyvinyl Alcohol-Povidone PF (REFRESH) 1.4-0.6 % SOLN Place 1 drop into both eyes daily as needed (luberication and dry eyes).   traMADol (ULTRAM) 50 MG tablet Take 1 tablet (50 mg total) by mouth every 6 (six) hours as needed.     Allergies  Allergen Reactions   Lipitor [Atorvastatin] Other (  See Comments)    Muscle issues   Penicillins Hives, Itching and Other (See Comments)    Social History   Socioeconomic History   Marital status: Married    Spouse name: Alvino Chapel   Number of children: 2   Years of education: Not on file   Highest education level: Not on file  Occupational History   Occupation: retired  Tobacco Use   Smoking status: Never   Smokeless tobacco: Never  Vaping Use   Vaping status: Never Used  Substance and Sexual Activity   Alcohol use: Not Currently    Comment: maybe 1 beer per month   Drug use: Never   Sexual activity: Not Currently    Comment: vasectomy  Other Topics Concern   Not  on file  Social History Narrative   Not on file   Social Determinants of Health   Financial Resource Strain: Not on file  Food Insecurity: No Food Insecurity (08/08/2022)   Hunger Vital Sign    Worried About Running Out of Food in the Last Year: Never true    Ran Out of Food in the Last Year: Never true  Transportation Needs: No Transportation Needs (08/08/2022)   PRAPARE - Administrator, Civil Service (Medical): No    Lack of Transportation (Non-Medical): No  Physical Activity: Not on file  Stress: Not on file  Social Connections: Unknown (10/14/2021)   [facility]   Social Network    Social Network: Not on file  Intimate Partner Violence: Not At Risk (08/08/2022)   Humiliation, Afraid, Rape, and Kick questionnaire    Fear of Current or Ex-Partner: No    Emotionally Abused: No    Physically Abused: No    Sexually Abused: No     Review of Systems: General: negative for chills, fever, night sweats or weight changes.  Cardiovascular: negative for chest pain, dyspnea on exertion, edema, orthopnea, palpitations, paroxysmal nocturnal dyspnea or shortness of breath Dermatological: negative for rash Respiratory: negative for cough or wheezing Urologic: negative for hematuria Abdominal: negative for nausea, vomiting, diarrhea, bright red blood per rectum, melena, or hematemesis Neurologic: negative for visual changes, syncope, or dizziness All other systems reviewed and are otherwise negative except as noted above.    Blood pressure 128/72, pulse 60, height 5\' 8"  (1.727 m), weight 154 lb 12.8 oz (70.2 kg).  General appearance: alert and no distress Neck: no adenopathy, no carotid bruit, no JVD, supple, symmetrical, trachea midline, and thyroid not enlarged, symmetric, no tenderness/mass/nodules Lungs: clear to auscultation bilaterally Heart: Regular rate and rhythm without murmurs gallops Extremities: extremities normal, atraumatic, no cyanosis or  edema Pulses: 2+ and symmetric Skin: Skin color, texture, turgor normal. No rashes or lesions Neurologic: Grossly normal  EKG not performed today      ASSESSMENT AND PLAN:   Pulmonary embolism (HCC) History of remote pulmonary embolism remaining on Xarelto oral anticoagulation.  Essential (primary) hypertension History of essential hypertension blood pressure measured today at 128/72.  He is on metoprolol.  S/P CABG x 3 History of CAD status post coronary CTA performed 06/24/2022 showing a coronary calcium score of 507 with multivessel disease.  This led to cardiac cath by myself 07/20/2022 revealed LAD diagonal branch bifurcation disease as well as severe RCA disease.  He ultimately underwent CABG x 3 by Dr. Delia Chimes  on 08/04/2022 with LIMA to his LAD, vein to diagonal branch and PDA.  He has recuperated nicely.  He participate in cardiac rehab program in [place].  He has had wound dehiscence in his right leg and has had removal of the seroma by Dr. Cliffton Asters recently.  Hyperlipidemia History of hyperlipidemia on inclisiran with lipid profile performed 12/10/2022 revealing a total cholesterol 68, LDL 0 and HDL of 46.     Runell Gess MD FACP,FACC,FAHA, [facility] 02/09/2023 10:09 AM

## 2023-02-10 DIAGNOSIS — I97641 Postprocedural seroma of a circulatory system organ or structure following cardiac bypass: Secondary | ICD-10-CM | POA: Diagnosis not present

## 2023-02-10 DIAGNOSIS — I2510 Atherosclerotic heart disease of native coronary artery without angina pectoris: Secondary | ICD-10-CM | POA: Diagnosis not present

## 2023-02-10 DIAGNOSIS — Z7901 Long term (current) use of anticoagulants: Secondary | ICD-10-CM | POA: Diagnosis not present

## 2023-02-10 DIAGNOSIS — Z7982 Long term (current) use of aspirin: Secondary | ICD-10-CM | POA: Diagnosis not present

## 2023-02-12 DIAGNOSIS — Z7901 Long term (current) use of anticoagulants: Secondary | ICD-10-CM | POA: Diagnosis not present

## 2023-02-12 DIAGNOSIS — I97641 Postprocedural seroma of a circulatory system organ or structure following cardiac bypass: Secondary | ICD-10-CM | POA: Diagnosis not present

## 2023-02-12 DIAGNOSIS — I2510 Atherosclerotic heart disease of native coronary artery without angina pectoris: Secondary | ICD-10-CM | POA: Diagnosis not present

## 2023-02-12 DIAGNOSIS — Z7982 Long term (current) use of aspirin: Secondary | ICD-10-CM | POA: Diagnosis not present

## 2023-02-15 ENCOUNTER — Ambulatory Visit (INDEPENDENT_AMBULATORY_CARE_PROVIDER_SITE_OTHER): Payer: Medicare Other | Admitting: Cardiothoracic Surgery

## 2023-02-15 ENCOUNTER — Encounter: Payer: Self-pay | Admitting: Cardiothoracic Surgery

## 2023-02-15 VITALS — BP 135/79 | HR 61 | Resp 18 | Ht 68.0 in

## 2023-02-15 DIAGNOSIS — Z4800 Encounter for change or removal of nonsurgical wound dressing: Secondary | ICD-10-CM

## 2023-02-15 DIAGNOSIS — T8130XA Disruption of wound, unspecified, initial encounter: Secondary | ICD-10-CM

## 2023-02-15 DIAGNOSIS — Z951 Presence of aortocoronary bypass graft: Secondary | ICD-10-CM

## 2023-02-15 MED ORDER — METOPROLOL TARTRATE 25 MG PO TABS: 12.5000 mg | ORAL_TABLET | Freq: Two times a day (BID) | ORAL | 2 refills | Status: DC

## 2023-02-15 NOTE — Progress Notes (Signed)
HPI: Here for wound check and packing 100% clean granulation tissue Daily wet/dry packing with one 4x4 gauze with Vashe  No problems or bleeding  He will resume Xarelto Current Outpatient Medications  Medication Sig Dispense Refill   acetaminophen (TYLENOL) 500 MG tablet Take 500-1,000 mg by mouth every 6 (six) hours as needed for moderate pain.     aspirin EC 81 MG tablet Take 1 tablet (81 mg total) by mouth daily. Swallow whole.     carbidopa-levodopa (SINEMET IR) 25-100 MG tablet Take 1 tablet by mouth 3 (three) times daily. 270 tablet 3   cetirizine (ZYRTEC) 10 MG tablet Take 10 mg by mouth daily as needed for allergies.     COLLAGEN PO Take 6 tablets by mouth daily.     cyanocobalamin (VITAMIN B12) 1000 MCG tablet Take 1,000 mcg by mouth daily.     donepezil (ARICEPT) 10 MG tablet Take 1 tablet (10 mg total) by mouth at bedtime. 90 tablet 3   gabapentin (NEURONTIN) 300 MG capsule Take 300 mg by mouth at bedtime as needed (pain).     inclisiran (LEQVIO) 284 MG/1.5ML SOSY injection Inject at 0, 3 months then q 6 months thereafter 1.5 mL 0   lidocaine (LIDODERM) 5 % Place 1 patch onto the skin daily as needed (pain).     Melatonin 10 MG CAPS Take 20 mg by mouth at bedtime.     omeprazole (PRILOSEC OTC) 20 MG tablet Take 20 mg by mouth in the morning.     Polyvinyl Alcohol-Povidone PF (REFRESH) 1.4-0.6 % SOLN Place 1 drop into both eyes daily as needed (luberication and dry eyes).     rivaroxaban (XARELTO) 20 MG TABS tablet Take 20 mg by mouth daily with supper.     traMADol (ULTRAM) 50 MG tablet Take 1 tablet (50 mg total) by mouth every 6 (six) hours as needed. 28 tablet 0   metoprolol tartrate (LOPRESSOR) 25 MG tablet Take 0.5 tablets (12.5 mg total) by mouth 2 (two) times daily. 15 tablet 2   No current facility-administered medications for this visit.     Physical Exam: Vitals:   02/15/23 1146  BP: 135/79  Pulse: 61  Resp: 18  Height: 5\' 8"  (1.727 m)  SpO2: 96% Comment: RA    Wound 5cm x 3 cm and shallow  Diagnostic Tests:  none  Impression: R thigh Ihealin\g well  Plan:  HE MAY SHOWER Cont daily wet/dry with Vashe Resume Xarelto RTC in 1 week   Lovett Sox, MD [facility] [phone]

## 2023-02-16 DIAGNOSIS — Z7901 Long term (current) use of anticoagulants: Secondary | ICD-10-CM | POA: Diagnosis not present

## 2023-02-16 DIAGNOSIS — I2510 Atherosclerotic heart disease of native coronary artery without angina pectoris: Secondary | ICD-10-CM | POA: Diagnosis not present

## 2023-02-16 DIAGNOSIS — Z7982 Long term (current) use of aspirin: Secondary | ICD-10-CM | POA: Diagnosis not present

## 2023-02-16 DIAGNOSIS — I97641 Postprocedural seroma of a circulatory system organ or structure following cardiac bypass: Secondary | ICD-10-CM | POA: Diagnosis not present

## 2023-02-18 DIAGNOSIS — I97641 Postprocedural seroma of a circulatory system organ or structure following cardiac bypass: Secondary | ICD-10-CM | POA: Diagnosis not present

## 2023-02-18 DIAGNOSIS — Z7901 Long term (current) use of anticoagulants: Secondary | ICD-10-CM | POA: Diagnosis not present

## 2023-02-18 DIAGNOSIS — Z7982 Long term (current) use of aspirin: Secondary | ICD-10-CM | POA: Diagnosis not present

## 2023-02-18 DIAGNOSIS — I2510 Atherosclerotic heart disease of native coronary artery without angina pectoris: Secondary | ICD-10-CM | POA: Diagnosis not present

## 2023-02-21 DIAGNOSIS — Z7901 Long term (current) use of anticoagulants: Secondary | ICD-10-CM | POA: Diagnosis not present

## 2023-02-21 DIAGNOSIS — I2510 Atherosclerotic heart disease of native coronary artery without angina pectoris: Secondary | ICD-10-CM | POA: Diagnosis not present

## 2023-02-21 DIAGNOSIS — Z7982 Long term (current) use of aspirin: Secondary | ICD-10-CM | POA: Diagnosis not present

## 2023-02-21 DIAGNOSIS — I97641 Postprocedural seroma of a circulatory system organ or structure following cardiac bypass: Secondary | ICD-10-CM | POA: Diagnosis not present

## 2023-02-22 ENCOUNTER — Encounter: Payer: Self-pay | Admitting: Cardiothoracic Surgery

## 2023-02-22 ENCOUNTER — Ambulatory Visit (INDEPENDENT_AMBULATORY_CARE_PROVIDER_SITE_OTHER): Payer: Medicare Other | Admitting: Cardiothoracic Surgery

## 2023-02-22 VITALS — BP 117/75 | HR 68 | Resp 20 | Ht 68.0 in | Wt 154.0 lb

## 2023-02-22 DIAGNOSIS — Z951 Presence of aortocoronary bypass graft: Secondary | ICD-10-CM

## 2023-02-22 DIAGNOSIS — T8130XA Disruption of wound, unspecified, initial encounter: Secondary | ICD-10-CM

## 2023-02-22 MED ORDER — METOPROLOL TARTRATE 25 MG PO TABS: 12.5000 mg | ORAL_TABLET | Freq: Two times a day (BID) | ORAL | 2 refills | Status: DC

## 2023-02-22 NOTE — Progress Notes (Signed)
HPI:  Patient presents for scheduled wound care of the right thigh. This involves the site of the previous saphenous vein harvest for CABG March 2024.  He developed a seroma which was debrided and covered with a wound VAC.  This wound had bleeding with a large hematoma which required evacuation and transition to Vashe wet-to-dry dressing changes every day.  Wound is not infected.  It is very shallow and contracting well.  It now holds two 2 x 2 gauze pads.  A home health nurse is assisting with the wound care.  Today the patient requested that his metoprolol prescription be refilled for 90 days.  Patient states his cholesterol level was 0 on his last lipid panel.  The patient has had no bleeding from wound after resuming the Xarelto for his history of A-fib.  Current Outpatient Medications  Medication Sig Dispense Refill   acetaminophen (TYLENOL) 500 MG tablet Take 500-1,000 mg by mouth every 6 (six) hours as needed for moderate pain.     aspirin EC 81 MG tablet Take 1 tablet (81 mg total) by mouth daily. Swallow whole.     carbidopa-levodopa (SINEMET IR) 25-100 MG tablet Take 1 tablet by mouth 3 (three) times daily. 270 tablet 3   cetirizine (ZYRTEC) 10 MG tablet Take 10 mg by mouth daily as needed for allergies.     COLLAGEN PO Take 6 tablets by mouth daily.     cyanocobalamin (VITAMIN B12) 1000 MCG tablet Take 1,000 mcg by mouth daily.     donepezil (ARICEPT) 10 MG tablet Take 1 tablet (10 mg total) by mouth at bedtime. 90 tablet 3   gabapentin (NEURONTIN) 300 MG capsule Take 300 mg by mouth at bedtime as needed (pain).     inclisiran (LEQVIO) 284 MG/1.5ML SOSY injection Inject at 0, 3 months then q 6 months thereafter 1.5 mL 0   lidocaine (LIDODERM) 5 % Place 1 patch onto the skin daily as needed (pain).     Melatonin 10 MG CAPS Take 20 mg by mouth at bedtime.     omeprazole (PRILOSEC OTC) 20 MG tablet Take 20 mg by mouth in the morning.     Polyvinyl Alcohol-Povidone PF (REFRESH)  1.4-0.6 % SOLN Place 1 drop into both eyes daily as needed (luberication and dry eyes).     rivaroxaban (XARELTO) 20 MG TABS tablet Take 20 mg by mouth daily with supper.     traMADol (ULTRAM) 50 MG tablet Take 1 tablet (50 mg total) by mouth every 6 (six) hours as needed. 28 tablet 0   metoprolol tartrate (LOPRESSOR) 25 MG tablet Take 0.5 tablets (12.5 mg total) by mouth 2 (two) times daily. 45 tablet 2   No current facility-administered medications for this visit.     Physical Exam: Blood pressure 117/75, pulse 68, resp. rate 20, height 5\' 8"  (1.727 m), weight 154 lb (69.9 kg), SpO2 95%.   Alert and comfortable Right leg wound 100% granulation and clean and very shallow -Personally performed the sterile dressing changes with Vashe wet-to-dry dressings using two 2 x 2 gauze. I applied silver nitrate to an area of serous fluid weeping, probably lymph, in an attempt to cauterize the source. Heart rate regular without murmur   Diagnostic Tests: None  Impression: Right thigh wound healing by secondary intention with wet-to-dry dressing changes daily. New clean Vashe wet-to-dry dressing and Ace wrap applied today Plan: Return in 1 week for wound care and assessment of progress.   Lovett Sox, MD [facility] [phone]

## 2023-02-24 DIAGNOSIS — Z7982 Long term (current) use of aspirin: Secondary | ICD-10-CM | POA: Diagnosis not present

## 2023-02-24 DIAGNOSIS — Z7901 Long term (current) use of anticoagulants: Secondary | ICD-10-CM | POA: Diagnosis not present

## 2023-02-24 DIAGNOSIS — I97641 Postprocedural seroma of a circulatory system organ or structure following cardiac bypass: Secondary | ICD-10-CM | POA: Diagnosis not present

## 2023-02-24 DIAGNOSIS — I2510 Atherosclerotic heart disease of native coronary artery without angina pectoris: Secondary | ICD-10-CM | POA: Diagnosis not present

## 2023-02-26 ENCOUNTER — Telehealth: Payer: Self-pay

## 2023-02-26 NOTE — Telephone Encounter (Signed)
Patient contacted the office concerned about drainage from his [facility] site wound. He states that when changing his dressing he noticed a yellowish tent to the gauze. He states that he is fever free, no smell from the site, and he does not have any increased pain or redness. He states that his wound looks the same. Spoke with Dr. Donata Clay, patient needs to apply more Vashe to the site, let it soak for about 5 mins, dry up with a dry clean gauze, then reapply a wet to dry Vashe dressing to the site as usual. Patient made aware of dressing change and increase in Vashe. He acknowledged receipt.

## 2023-03-01 ENCOUNTER — Encounter: Payer: Self-pay | Admitting: Cardiothoracic Surgery

## 2023-03-01 ENCOUNTER — Ambulatory Visit (INDEPENDENT_AMBULATORY_CARE_PROVIDER_SITE_OTHER): Payer: Medicare Other | Admitting: Cardiothoracic Surgery

## 2023-03-01 VITALS — BP 130/83 | HR 60 | Resp 18 | Ht 68.0 in | Wt 157.0 lb

## 2023-03-01 DIAGNOSIS — Z5189 Encounter for other specified aftercare: Secondary | ICD-10-CM

## 2023-03-01 DIAGNOSIS — I2510 Atherosclerotic heart disease of native coronary artery without angina pectoris: Secondary | ICD-10-CM | POA: Diagnosis not present

## 2023-03-01 DIAGNOSIS — Z7901 Long term (current) use of anticoagulants: Secondary | ICD-10-CM | POA: Diagnosis not present

## 2023-03-01 DIAGNOSIS — Z7982 Long term (current) use of aspirin: Secondary | ICD-10-CM | POA: Diagnosis not present

## 2023-03-01 DIAGNOSIS — I97641 Postprocedural seroma of a circulatory system organ or structure following cardiac bypass: Secondary | ICD-10-CM | POA: Diagnosis not present

## 2023-03-01 NOTE — Progress Notes (Signed)
HPI: Patient returns for scheduled wound check and dressing change. The wound continues to granulate in secondarily and now is about the size of a half dollar coin.  It is very shallow.  There is a shallow cleft /crease  in the lower part of the wound which was scraped and cauterized with silver nitrate.  A 2 x 2 wet-to-dry Vashe dressing was applied and covered with Tegaderm.  There is no erythema or cellulitis in the leg.  There is no edema in the leg.  Patient will continue daily wet-to-dry Vashe dressing changes covered with Tegaderm.  He will return for 1 week follow-up.  Current Outpatient Medications  Medication Sig Dispense Refill   acetaminophen (TYLENOL) 500 MG tablet Take 500-1,000 mg by mouth every 6 (six) hours as needed for moderate pain.     aspirin EC 81 MG tablet Take 1 tablet (81 mg total) by mouth daily. Swallow whole.     carbidopa-levodopa (SINEMET IR) 25-100 MG tablet Take 1 tablet by mouth 3 (three) times daily. 270 tablet 3   cetirizine (ZYRTEC) 10 MG tablet Take 10 mg by mouth daily as needed for allergies.     COLLAGEN PO Take 6 tablets by mouth daily.     cyanocobalamin (VITAMIN B12) 1000 MCG tablet Take 1,000 mcg by mouth daily.     donepezil (ARICEPT) 10 MG tablet Take 1 tablet (10 mg total) by mouth at bedtime. 90 tablet 3   gabapentin (NEURONTIN) 300 MG capsule Take 300 mg by mouth at bedtime as needed (pain).     inclisiran (LEQVIO) 284 MG/1.5ML SOSY injection Inject at 0, 3 months then q 6 months thereafter 1.5 mL 0   lidocaine (LIDODERM) 5 % Place 1 patch onto the skin daily as needed (pain).     Melatonin 10 MG CAPS Take 20 mg by mouth at bedtime.     metoprolol tartrate (LOPRESSOR) 25 MG tablet Take 0.5 tablets (12.5 mg total) by mouth 2 (two) times daily. 45 tablet 2   omeprazole (PRILOSEC OTC) 20 MG tablet Take 20 mg by mouth in the morning.     Polyvinyl Alcohol-Povidone PF (REFRESH) 1.4-0.6 % SOLN Place 1 drop into both eyes daily as needed (luberication  and dry eyes).     rivaroxaban (XARELTO) 20 MG TABS tablet Take 20 mg by mouth daily with supper.     traMADol (ULTRAM) 50 MG tablet Take 1 tablet (50 mg total) by mouth every 6 (six) hours as needed. 28 tablet 0   No current facility-administered medications for this visit.     Physical Exam: Vitals:   03/01/23 1124  BP: 130/83  Pulse: 60  Resp: 18  SpO2: 98%  Alert and comfortable Lungs clear Heart rate regular No tenderness around the right lower thigh wound Wound is contracting well, 100% granulation tissue.   Diagnostic Tests: None  Impression: Old right thigh saphenous vein harvest wound now healing by secondary intent.  Continue current care with daily Vashe wet-to-dry dressing changes.  Plan: Return in 1 week for review of progress and wound care.   Lovett Sox, MD [facility] [phone]

## 2023-03-04 ENCOUNTER — Telehealth: Payer: Self-pay

## 2023-03-04 NOTE — Telephone Encounter (Signed)
Patient contacted the office about his [facility] site surgical wound. He states that he has had increased fluid coming from the wound when applying the Tegaderm. He states that it has been weeping and is a little more painful than usual. The wound bed does look "beefy" per the patient. He states that he has been doing the Vashe wet-to-dry dressing as directed by Dr. Donata Clay. In the last three days he has been applying the Vashe wet-to-dry but he has gone back to applying gauze and ace wrapping the wound. Patient states that since this the drainage has decreased. Spoke with Dr. Donata Clay to states he needs to continue using the ace wrap and do not use the Tegaderm for now. He also states to increase his protein intake to help aid in wound healing. Spoke with patient and he acknowledged receipt. He is aware of his follow-up appointment next week.

## 2023-03-05 DIAGNOSIS — Z7901 Long term (current) use of anticoagulants: Secondary | ICD-10-CM | POA: Diagnosis not present

## 2023-03-05 DIAGNOSIS — I97641 Postprocedural seroma of a circulatory system organ or structure following cardiac bypass: Secondary | ICD-10-CM | POA: Diagnosis not present

## 2023-03-05 DIAGNOSIS — Z7982 Long term (current) use of aspirin: Secondary | ICD-10-CM | POA: Diagnosis not present

## 2023-03-05 DIAGNOSIS — I2510 Atherosclerotic heart disease of native coronary artery without angina pectoris: Secondary | ICD-10-CM | POA: Diagnosis not present

## 2023-03-07 ENCOUNTER — Other Ambulatory Visit: Payer: Self-pay | Admitting: Neurology

## 2023-03-08 ENCOUNTER — Ambulatory Visit (INDEPENDENT_AMBULATORY_CARE_PROVIDER_SITE_OTHER): Payer: Medicare Other | Admitting: Cardiothoracic Surgery

## 2023-03-08 VITALS — BP 125/74 | HR 64 | Resp 20 | Ht 68.0 in | Wt 157.0 lb

## 2023-03-08 DIAGNOSIS — Z951 Presence of aortocoronary bypass graft: Secondary | ICD-10-CM

## 2023-03-08 DIAGNOSIS — T8130XA Disruption of wound, unspecified, initial encounter: Secondary | ICD-10-CM

## 2023-03-08 MED ORDER — METOPROLOL TARTRATE 25 MG PO TABS: 12.5000 mg | ORAL_TABLET | Freq: Two times a day (BID) | ORAL | 4 refills | Status: AC

## 2023-03-08 NOTE — Progress Notes (Signed)
HPI: Patient presents for postoperative wound care after debridement of a right thigh recurrent lymphocele at the site of the saphenous vein harvest.  He is using a Vashe wet-to-dry dressing with 2 x 2 gauze daily.  Wound cultures have been negative.  The wound is 100% granulation tissue and is contracting and is shallow.  It now measures 1.5 cm wide by 3 cm long.  The lower margin of the wound has some lymphatic fluid weeping which is being treated with topical silver nitrate cauterization.  Current Outpatient Medications  Medication Sig Dispense Refill   acetaminophen (TYLENOL) 500 MG tablet Take 500-1,000 mg by mouth every 6 (six) hours as needed for moderate pain.     aspirin EC 81 MG tablet Take 1 tablet (81 mg total) by mouth daily. Swallow whole.     carbidopa-levodopa (SINEMET IR) 25-100 MG tablet Take 1 tablet by mouth 3 (three) times daily. 270 tablet 3   cetirizine (ZYRTEC) 10 MG tablet Take 10 mg by mouth daily as needed for allergies.     COLLAGEN PO Take 6 tablets by mouth daily.     cyanocobalamin (VITAMIN B12) 1000 MCG tablet Take 1,000 mcg by mouth daily.     donepezil (ARICEPT) 10 MG tablet Take 1 tablet (10 mg total) by mouth daily after supper. Appointment needed for further refills 30 tablet 0   gabapentin (NEURONTIN) 300 MG capsule Take 300 mg by mouth at bedtime as needed (pain).     inclisiran (LEQVIO) 284 MG/1.5ML SOSY injection Inject at 0, 3 months then q 6 months thereafter 1.5 mL 0   lidocaine (LIDODERM) 5 % Place 1 patch onto the skin daily as needed (pain).     Melatonin 10 MG CAPS Take 20 mg by mouth at bedtime.     omeprazole (PRILOSEC OTC) 20 MG tablet Take 20 mg by mouth in the morning.     Polyvinyl Alcohol-Povidone PF (REFRESH) 1.4-0.6 % SOLN Place 1 drop into both eyes daily as needed (luberication and dry eyes).     rivaroxaban (XARELTO) 20 MG TABS tablet Take 20 mg by mouth daily with supper.     rosuvastatin (CRESTOR) 5 MG tablet Take 5 mg by mouth daily.      traMADol (ULTRAM) 50 MG tablet Take 1 tablet (50 mg total) by mouth every 6 (six) hours as needed. 28 tablet 0   metoprolol tartrate (LOPRESSOR) 25 MG tablet Take 0.5 tablets (12.5 mg total) by mouth 2 (two) times daily. 60 tablet 4   No current facility-administered medications for this visit.     Physical Exam:      Exam   Wound-the right thigh wound is 100% granulation tissue, small and contracting.  No surrounding cellulitis.   General- alert and comfortable    Neck- no JVD, no cervical adenopathy palpable, no carotid bruit   Lungs- clear without rales, wheezes   Cor- regular rate and rhythm, no murmur , gallop   Abdomen- soft, non-tender   Extremities - warm, non-tender, minimal edema   Neuro- oriented, appropriate, no focal weakness   Diagnostic Tests: None  Impression: Right thigh wound healing by secondary intent Continue daily Vashe wet-to-dry dressing changes Continue every other day silver nitrate cauterization of lower edge which is producing lymphatic drainage  Plan: Return for office visit with wound care on October 11.   Lovett Sox, MD [facility] [phone]

## 2023-03-09 ENCOUNTER — Encounter: Payer: Self-pay | Admitting: Cardiothoracic Surgery

## 2023-03-09 ENCOUNTER — Ambulatory Visit (INDEPENDENT_AMBULATORY_CARE_PROVIDER_SITE_OTHER): Payer: Medicare Other | Admitting: Cardiothoracic Surgery

## 2023-03-09 DIAGNOSIS — T148XXA Other injury of unspecified body region, initial encounter: Secondary | ICD-10-CM

## 2023-03-09 NOTE — Progress Notes (Signed)
HPI: Patient presented with bleeding from R thigh wound when out shopping. Had wound care yesterday in clinic. Had been restsarted on Xarelto a week ago.  Bleeding at lower margin of leg wound stopped with interrupted sutures under sterile tchniqueand local 1% anesthesia'  Placed wet/dry dressing with Vashe and a wrap kerlex/Acewrap  Current Outpatient Medications  Medication Sig Dispense Refill   acetaminophen (TYLENOL) 500 MG tablet Take 500-1,000 mg by mouth every 6 (six) hours as needed for moderate pain.     aspirin EC 81 MG tablet Take 1 tablet (81 mg total) by mouth daily. Swallow whole.     carbidopa-levodopa (SINEMET IR) 25-100 MG tablet Take 1 tablet by mouth 3 (three) times daily. 270 tablet 3   cetirizine (ZYRTEC) 10 MG tablet Take 10 mg by mouth daily as needed for allergies.     COLLAGEN PO Take 6 tablets by mouth daily.     cyanocobalamin (VITAMIN B12) 1000 MCG tablet Take 1,000 mcg by mouth daily.     donepezil (ARICEPT) 10 MG tablet Take 1 tablet (10 mg total) by mouth daily after supper. Appointment needed for further refills 30 tablet 0   gabapentin (NEURONTIN) 300 MG capsule Take 300 mg by mouth at bedtime as needed (pain).     inclisiran (LEQVIO) 284 MG/1.5ML SOSY injection Inject at 0, 3 months then q 6 months thereafter 1.5 mL 0   lidocaine (LIDODERM) 5 % Place 1 patch onto the skin daily as needed (pain).     Melatonin 10 MG CAPS Take 20 mg by mouth at bedtime.     metoprolol tartrate (LOPRESSOR) 25 MG tablet Take 0.5 tablets (12.5 mg total) by mouth 2 (two) times daily. 60 tablet 4   omeprazole (PRILOSEC OTC) 20 MG tablet Take 20 mg by mouth in the morning.     Polyvinyl Alcohol-Povidone PF (REFRESH) 1.4-0.6 % SOLN Place 1 drop into both eyes daily as needed (luberication and dry eyes).     rivaroxaban (XARELTO) 20 MG TABS tablet Take 20 mg by mouth daily with supper.     rosuvastatin (CRESTOR) 5 MG tablet Take 5 mg by mouth daily.     traMADol (ULTRAM) 50 MG tablet  Take 1 tablet (50 mg total) by mouth every 6 (six) hours as needed. 28 tablet 0   No current facility-administered medications for this visit.     Physical Exam: HR 84 BP 110/60 Bleeding R thigh wound stopped with figure of 8 nylon sutures   Diagnostic Tests: none  Impression: Delayed bleeding after wound care probably related to Xarelto  Plan: Hold Xarelto RTC in 3 days, will call us if more bleeding   Lovett Sox, MD [facility] [phone]

## 2023-03-11 DIAGNOSIS — Z7982 Long term (current) use of aspirin: Secondary | ICD-10-CM | POA: Diagnosis not present

## 2023-03-11 DIAGNOSIS — I2510 Atherosclerotic heart disease of native coronary artery without angina pectoris: Secondary | ICD-10-CM | POA: Diagnosis not present

## 2023-03-11 DIAGNOSIS — Z7901 Long term (current) use of anticoagulants: Secondary | ICD-10-CM | POA: Diagnosis not present

## 2023-03-11 DIAGNOSIS — I97641 Postprocedural seroma of a circulatory system organ or structure following cardiac bypass: Secondary | ICD-10-CM | POA: Diagnosis not present

## 2023-03-12 ENCOUNTER — Ambulatory Visit (INDEPENDENT_AMBULATORY_CARE_PROVIDER_SITE_OTHER): Payer: Medicare Other | Admitting: Cardiothoracic Surgery

## 2023-03-12 ENCOUNTER — Encounter: Payer: Self-pay | Admitting: Cardiothoracic Surgery

## 2023-03-12 VITALS — BP 124/77 | HR 59 | Resp 20 | Wt 156.8 lb

## 2023-03-12 DIAGNOSIS — Z4802 Encounter for removal of sutures: Secondary | ICD-10-CM | POA: Insufficient documentation

## 2023-03-12 NOTE — Progress Notes (Signed)
HPI: Patient returns for wound care and suture removal. He previously presented with active bleeding from his wound in the right thigh originally from saphenous vein harvest.  Bleeding resulted from fresh granulation tissue forming on the adductor muscle group with recent resumption of Xarelto anticoagulation.  Several sutures were placed in the wound to control the bleeding.  The patient subsequently has continued daily dressing changes with Vashe wet-to-dry dressing.  The wound is contracting significantly and now only measures 3 cm long by 2 cm wide with 100% granulation tissue.  On examination of the wound most of the sutures have loosened having cut through the granulation tissue.  These were removed.  A single suture were approximately remains.  There is no active bleeding in the tissue was very clean with granulation and growth.  Current Outpatient Medications  Medication Sig Dispense Refill   acetaminophen (TYLENOL) 500 MG tablet Take 500-1,000 mg by mouth every 6 (six) hours as needed for moderate pain.     aspirin EC 81 MG tablet Take 1 tablet (81 mg total) by mouth daily. Swallow whole.     carbidopa-levodopa (SINEMET IR) 25-100 MG tablet Take 1 tablet by mouth 3 (three) times daily. 270 tablet 3   cetirizine (ZYRTEC) 10 MG tablet Take 10 mg by mouth daily as needed for allergies.     COLLAGEN PO Take 6 tablets by mouth daily.     cyanocobalamin (VITAMIN B12) 1000 MCG tablet Take 1,000 mcg by mouth daily.     donepezil (ARICEPT) 10 MG tablet Take 1 tablet (10 mg total) by mouth daily after supper. Appointment needed for further refills 30 tablet 0   gabapentin (NEURONTIN) 300 MG capsule Take 300 mg by mouth at bedtime as needed (pain).     inclisiran (LEQVIO) 284 MG/1.5ML SOSY injection Inject at 0, 3 months then q 6 months thereafter 1.5 mL 0   lidocaine (LIDODERM) 5 % Place 1 patch onto the skin daily as needed (pain).     Melatonin 10 MG CAPS Take 20 mg by mouth at bedtime.      metoprolol tartrate (LOPRESSOR) 25 MG tablet Take 0.5 tablets (12.5 mg total) by mouth 2 (two) times daily. 60 tablet 4   omeprazole (PRILOSEC OTC) 20 MG tablet Take 20 mg by mouth in the morning.     Polyvinyl Alcohol-Povidone PF (REFRESH) 1.4-0.6 % SOLN Place 1 drop into both eyes daily as needed (luberication and dry eyes).     rivaroxaban (XARELTO) 20 MG TABS tablet Take 20 mg by mouth daily with supper.     rosuvastatin (CRESTOR) 5 MG tablet Take 5 mg by mouth daily.     traMADol (ULTRAM) 50 MG tablet Take 1 tablet (50 mg total) by mouth every 6 (six) hours as needed. 28 tablet 0   No current facility-administered medications for this visit.     Physical Exam: Blood pressure 124/77, pulse (!) 59, resp. rate 20, weight 156 lb 12.8 oz (71.1 kg), SpO2 95%.   Alert and comfortable Lungs clear Heart rate regular Right thigh wound clean and dry.  I placed Vashe 2 x 2 wet-to-dry dressings over the wound and wrapped the thigh with a Curlex then Ace wrap.  Diagnostic Tests: None  Impression: Bleeding from leg wound has been resolved. Continue wet-to-dry dressing changes with Vashe and 2 x 2 gauze as the wound continues to close by secondary intent. Plan: Patient returns for wound care in 4 days. Continue to hold Xarelto due to risk of bleeding  from wound.  Lovett Sox, MD [facility] [phone]

## 2023-03-16 ENCOUNTER — Encounter: Payer: Self-pay | Admitting: Cardiothoracic Surgery

## 2023-03-16 ENCOUNTER — Ambulatory Visit (INDEPENDENT_AMBULATORY_CARE_PROVIDER_SITE_OTHER): Payer: Medicare Other | Admitting: Cardiothoracic Surgery

## 2023-03-16 ENCOUNTER — Telehealth: Payer: Self-pay

## 2023-03-16 VITALS — BP 136/78 | HR 60 | Resp 20 | Ht 68.0 in | Wt 156.0 lb

## 2023-03-16 DIAGNOSIS — T8189XA Other complications of procedures, not elsewhere classified, initial encounter: Secondary | ICD-10-CM

## 2023-03-16 DIAGNOSIS — Z5189 Encounter for other specified aftercare: Secondary | ICD-10-CM

## 2023-03-16 DIAGNOSIS — I898 Other specified noninfective disorders of lymphatic vessels and lymph nodes: Secondary | ICD-10-CM | POA: Insufficient documentation

## 2023-03-16 DIAGNOSIS — Z951 Presence of aortocoronary bypass graft: Secondary | ICD-10-CM

## 2023-03-16 DIAGNOSIS — T8130XA Disruption of wound, unspecified, initial encounter: Secondary | ICD-10-CM

## 2023-03-16 NOTE — Progress Notes (Signed)
HPI: Return for wound care, R thigh endo vein harvest site.  The wound is contracting and with complete good quality granulation tissue, but still with some lymphatic leak.  I applied a figure of 8 3-0 nylon suture under topical anesthesia at the site of the lymph fluid and wrapped the thigh with an [place] boot/ Ace . He will keep this in place until return to clinic in 3 days. Continue to hold Xarelto, cont 81 mg ASA  Current Outpatient Medications  Medication Sig Dispense Refill   acetaminophen (TYLENOL) 500 MG tablet Take 500-1,000 mg by mouth every 6 (six) hours as needed for moderate pain.     aspirin EC 81 MG tablet Take 1 tablet (81 mg total) by mouth daily. Swallow whole.     carbidopa-levodopa (SINEMET IR) 25-100 MG tablet Take 1 tablet by mouth 3 (three) times daily. 270 tablet 3   cetirizine (ZYRTEC) 10 MG tablet Take 10 mg by mouth daily as needed for allergies.     COLLAGEN PO Take 6 tablets by mouth daily.     cyanocobalamin (VITAMIN B12) 1000 MCG tablet Take 1,000 mcg by mouth daily.     donepezil (ARICEPT) 10 MG tablet Take 1 tablet (10 mg total) by mouth daily after supper. Appointment needed for further refills 30 tablet 0   gabapentin (NEURONTIN) 300 MG capsule Take 300 mg by mouth at bedtime as needed (pain).     inclisiran (LEQVIO) 284 MG/1.5ML SOSY injection Inject at 0, 3 months then q 6 months thereafter 1.5 mL 0   lidocaine (LIDODERM) 5 % Place 1 patch onto the skin daily as needed (pain).     Melatonin 10 MG CAPS Take 20 mg by mouth at bedtime.     metoprolol tartrate (LOPRESSOR) 25 MG tablet Take 0.5 tablets (12.5 mg total) by mouth 2 (two) times daily. 60 tablet 4   omeprazole (PRILOSEC OTC) 20 MG tablet Take 20 mg by mouth in the morning.     Polyvinyl Alcohol-Povidone PF (REFRESH) 1.4-0.6 % SOLN Place 1 drop into both eyes daily as needed (luberication and dry eyes).     rivaroxaban (XARELTO) 20 MG TABS tablet Take 20 mg by mouth daily with supper.     rosuvastatin  (CRESTOR) 5 MG tablet Take 5 mg by mouth daily.     traMADol (ULTRAM) 50 MG tablet Take 1 tablet (50 mg total) by mouth every 6 (six) hours as needed. 28 tablet 0   No current facility-administered medications for this visit.     Physical Exam: Blood pressure 136/78, pulse 60, resp. rate 20, height 5\' 8"  (1.727 m), weight 156 lb (70.8 kg), SpO2 95%.        Exam    General- alert and comfortable. Thigh wound 3 cm long, 0.5 cm wide and at skin depth.All clean granulation tissue.    Neck- no JVD, no cervical adenopathy palpable, no carotid bruit   Lungs- clear without rales, wheezes   Cor- regular rate and rhythm, no murmur , gallop   Abdomen- soft, non-tender   Extremities - warm, non-tender, minimal edema   Neuro- oriented, appropriate, no focal weakness  Diagnostic Tests: none  Impression: Thigh wound now healing well by secondary intent. Sutures placed to compress the site of lymph leak and unna boot wrap placed around wound to remain until next visit in 3 days.  Plan: RTC 10-18   Lovett Sox, MD [facility] [phone]

## 2023-03-16 NOTE — Telephone Encounter (Signed)
Patient contacted the office stating that he is experiencing drainage coming from his wound. It is draining down his leg over the newly placed dressing done today by Dr. Donata Clay. Dr. Donata Clay made aware and advised for patient to remove current dressing in place and apply gauze, abd and ace wrap with Vashe to the wound bed as he had been doing. Patient aware and acknowledged receipt.

## 2023-03-19 ENCOUNTER — Encounter: Payer: Self-pay | Admitting: Cardiothoracic Surgery

## 2023-03-19 ENCOUNTER — Ambulatory Visit (INDEPENDENT_AMBULATORY_CARE_PROVIDER_SITE_OTHER): Payer: Medicare Other | Admitting: Cardiothoracic Surgery

## 2023-03-19 VITALS — BP 119/73 | HR 72 | Resp 20 | Wt 158.6 lb

## 2023-03-19 DIAGNOSIS — T148XXA Other injury of unspecified body region, initial encounter: Secondary | ICD-10-CM

## 2023-03-19 DIAGNOSIS — Z4800 Encounter for change or removal of nonsurgical wound dressing: Secondary | ICD-10-CM

## 2023-03-19 DIAGNOSIS — Z4889 Encounter for other specified surgical aftercare: Secondary | ICD-10-CM | POA: Insufficient documentation

## 2023-03-19 NOTE — Progress Notes (Signed)
HPI:  The patient presents for scheduled wound care. The lymphocele debridement site in the mid right thigh at the previous Endo saphenous vein harvest site continues to heal and contract with 100% clean granulation tissue.  There remains some lymphatic drainage which appears to be best handled by compression with a Kerlix/Ace wrap around the thigh over a Vashe wet-to-dry 2 x 2 gauze.  Patient is performing this change daily and the amount of clear fluid seems to be slowly reducing.  He is also trying to keep the leg elevated as much as possible.  There is no evidence of cellulitis or infection.  Current Outpatient Medications  Medication Sig Dispense Refill   acetaminophen (TYLENOL) 500 MG tablet Take 500-1,000 mg by mouth every 6 (six) hours as needed for moderate pain.     aspirin EC 81 MG tablet Take 1 tablet (81 mg total) by mouth daily. Swallow whole.     carbidopa-levodopa (SINEMET IR) 25-100 MG tablet Take 1 tablet by mouth 3 (three) times daily. 270 tablet 3   cetirizine (ZYRTEC) 10 MG tablet Take 10 mg by mouth daily as needed for allergies.     COLLAGEN PO Take 6 tablets by mouth daily.     cyanocobalamin (VITAMIN B12) 1000 MCG tablet Take 1,000 mcg by mouth daily.     donepezil (ARICEPT) 10 MG tablet Take 1 tablet (10 mg total) by mouth daily after supper. Appointment needed for further refills 30 tablet 0   gabapentin (NEURONTIN) 300 MG capsule Take 300 mg by mouth at bedtime as needed (pain).     inclisiran (LEQVIO) 284 MG/1.5ML SOSY injection Inject at 0, 3 months then q 6 months thereafter 1.5 mL 0   lidocaine (LIDODERM) 5 % Place 1 patch onto the skin daily as needed (pain).     Melatonin 10 MG CAPS Take 20 mg by mouth at bedtime.     metoprolol tartrate (LOPRESSOR) 25 MG tablet Take 0.5 tablets (12.5 mg total) by mouth 2 (two) times daily. 60 tablet 4   omeprazole (PRILOSEC OTC) 20 MG tablet Take 20 mg by mouth in the morning.     Polyvinyl Alcohol-Povidone PF (REFRESH) 1.4-0.6  % SOLN Place 1 drop into both eyes daily as needed (luberication and dry eyes).     rivaroxaban (XARELTO) 20 MG TABS tablet Take 20 mg by mouth daily with supper.     rosuvastatin (CRESTOR) 5 MG tablet Take 5 mg by mouth daily.     traMADol (ULTRAM) 50 MG tablet Take 1 tablet (50 mg total) by mouth every 6 (six) hours as needed. 28 tablet 0   No current facility-administered medications for this visit.     Physical Exam: Blood pressure 119/73, pulse 72, resp. rate 20, weight 158 lb 9.6 oz (71.9 kg), SpO2 96%.        Exam    General- alert and comfortable    Neck- no JVD, no cervical adenopathy palpable, no carotid bruit   Lungs- clear without rales, wheezes   Cor- regular rate and rhythm, no murmur , gallop   Abdomen- soft, non-tender   Extremities - warm, non-tender, minimal edema   Neuro- oriented, appropriate, no focal weakness  Diagnostic Tests: None  Impression:  Healing by secondary intent of right thigh postdebridement wound.  Persistent low-grade lymphatic drainage appears to be improving.  Plan: Continue current dressing changes as described above we will continue to follow closely in the clinic, next appointment will be October 30 following his out-of-town trip.  Continue to hold Xarelto because of previous wound bleeding problems until he returns back home.  Continue taking 81 mg aspirin daily.  Lovett Sox, MD [facility] [phone]

## 2023-03-28 ENCOUNTER — Other Ambulatory Visit: Payer: Self-pay | Admitting: Neurology

## 2023-03-29 ENCOUNTER — Telehealth: Payer: Self-pay | Admitting: Neurology

## 2023-03-29 MED ORDER — CARBIDOPA-LEVODOPA 25-100 MG PO TABS: 1.0000 | ORAL_TABLET | Freq: Three times a day (TID) | ORAL | 3 refills | Status: DC

## 2023-03-29 NOTE — Telephone Encounter (Signed)
Phone room: Let pt know it has been refilled  Thanks,  [employer]

## 2023-03-29 NOTE — Telephone Encounter (Signed)
Pt has scheduled his needed 1 yr f/u , he is now asking for a refill on his carbidopa-levodopa (SINEMET IR) 25-100 MG tablet & donepezil (ARICEPT) 10 MG tablet to [facility] [address] [place]

## 2023-03-29 NOTE — Telephone Encounter (Signed)
Pt was called and informed. Pt verbalized appreciation.

## 2023-03-31 ENCOUNTER — Encounter: Payer: Self-pay | Admitting: Cardiothoracic Surgery

## 2023-03-31 ENCOUNTER — Ambulatory Visit (INDEPENDENT_AMBULATORY_CARE_PROVIDER_SITE_OTHER): Payer: Medicare Other | Admitting: Cardiothoracic Surgery

## 2023-03-31 VITALS — BP 149/86 | HR 58 | Resp 58 | Ht 68.0 in

## 2023-03-31 DIAGNOSIS — T8130XA Disruption of wound, unspecified, initial encounter: Secondary | ICD-10-CM

## 2023-03-31 DIAGNOSIS — Z4801 Encounter for change or removal of surgical wound dressing: Secondary | ICD-10-CM | POA: Insufficient documentation

## 2023-03-31 DIAGNOSIS — Z951 Presence of aortocoronary bypass graft: Secondary | ICD-10-CM

## 2023-03-31 NOTE — Progress Notes (Signed)
HPI: Scheduled office visit for wound care of the right thigh incision for saphenous vein harvest.  Originally started as a lymphocele.  Following surgical debridement and wound VAC therapy the patient developed a hematoma in the defect associated with Xarelto.  The wound subsequently has been healing with wet-to-dry dressing changes using Vashe wound solution, healing by secondary intent.  There has been some recurrent lymph drainage from the granulation tissue and this was treated with some interrupted sutures at the time the patient had a second significant bleeding episode in the wound related to Xarelto.  Currently the patient is off Xarelto, there is no bleeding, there is no lymphatic drainage, and the sutures are still in place.  Wound is very small and superficial with clean granulation tissue and should completely heal in the next 2 to 3 weeks.  Today I applied a new 2 x 2 gauze Vashe wet-to-dry dressing with a compressive Ace wrap around the lower thigh.  He will continue daily dressing changes using Vashe wet-to-dry and an Ace wrap.  Current Outpatient Medications  Medication Sig Dispense Refill   acetaminophen (TYLENOL) 500 MG tablet Take 500-1,000 mg by mouth every 6 (six) hours as needed for moderate pain.     aspirin EC 81 MG tablet Take 1 tablet (81 mg total) by mouth daily. Swallow whole.     carbidopa-levodopa (SINEMET IR) 25-100 MG tablet Take 1 tablet by mouth 3 (three) times daily. 270 tablet 3   cetirizine (ZYRTEC) 10 MG tablet Take 10 mg by mouth daily as needed for allergies.     COLLAGEN PO Take 6 tablets by mouth daily.     cyanocobalamin (VITAMIN B12) 1000 MCG tablet Take 1,000 mcg by mouth daily.     donepezil (ARICEPT) 10 MG tablet Take 1 tablet (10 mg total) by mouth daily after supper. Appointment needed for further refills 30 tablet 0   gabapentin (NEURONTIN) 300 MG capsule Take 300 mg by mouth at bedtime as needed (pain).     inclisiran (LEQVIO) 284 MG/1.5ML SOSY  injection Inject at 0, 3 months then q 6 months thereafter 1.5 mL 0   lidocaine (LIDODERM) 5 % Place 1 patch onto the skin daily as needed (pain).     Melatonin 10 MG CAPS Take 20 mg by mouth at bedtime.     metoprolol tartrate (LOPRESSOR) 25 MG tablet Take 0.5 tablets (12.5 mg total) by mouth 2 (two) times daily. 60 tablet 4   omeprazole (PRILOSEC OTC) 20 MG tablet Take 20 mg by mouth in the morning.     Polyvinyl Alcohol-Povidone PF (REFRESH) 1.4-0.6 % SOLN Place 1 drop into both eyes daily as needed (luberication and dry eyes).     rivaroxaban (XARELTO) 20 MG TABS tablet Take 20 mg by mouth daily with supper.     traMADol (ULTRAM) 50 MG tablet Take 1 tablet (50 mg total) by mouth every 6 (six) hours as needed. 28 tablet 0   No current facility-administered medications for this visit.     Physical Exam: Blood pressure (!) 149/86, pulse (!) 58, resp. rate (!) 58, height 5\' 8"  (1.727 m), SpO2 100%.  Alert and comfortable Heart rate regular, sinus Lungs clear No ankle edema Right thigh wound measures 1.5 cm in length, shallow, with 100% granulation tissue.  2 nylon sutures are intact.  Diagnostic Tests: None  Impression: Right leg wound almost healed and without lymphatic drainage No bleeding episodes while off Xarelto  Plan: Continue daily wet-to-dry dressing changes and Ace wrap.  Continue to hold Xarelto for another week.  The patient will return in 7 days for wound check, suture removal, and to discuss when to resume Xarelto.   Lovett Sox, MD [facility] [phone]

## 2023-04-01 ENCOUNTER — Other Ambulatory Visit: Payer: Self-pay | Admitting: Neurology

## 2023-04-07 ENCOUNTER — Encounter: Payer: Self-pay | Admitting: Cardiothoracic Surgery

## 2023-04-07 ENCOUNTER — Ambulatory Visit (INDEPENDENT_AMBULATORY_CARE_PROVIDER_SITE_OTHER): Payer: Medicare Other | Admitting: Cardiothoracic Surgery

## 2023-04-07 VITALS — BP 135/79 | HR 63 | Wt 159.0 lb

## 2023-04-07 DIAGNOSIS — Z4800 Encounter for change or removal of nonsurgical wound dressing: Secondary | ICD-10-CM

## 2023-04-07 DIAGNOSIS — Z5189 Encounter for other specified aftercare: Secondary | ICD-10-CM | POA: Diagnosis not present

## 2023-04-07 NOTE — Progress Notes (Signed)
HPI: The patient presents for evaluation care of his right thigh wound.  It has now healed.  There is no evidence of lymphatic drainage in the gauze.  The sutures that were placed to prevent lymphatic drainage were removed.  The patient will perform daily normal skin care to the area and cover with a dry Band-Aid.  Patient will hold off on resuming the Xarelto.  He had significant bleeding from the wound which required suture ligation.  At his next visit if the wound continues to be doing well we will continue the Xarelto.  Current Outpatient Medications  Medication Sig Dispense Refill   acetaminophen (TYLENOL) 500 MG tablet Take 500-1,000 mg by mouth every 6 (six) hours as needed for moderate pain.     aspirin EC 81 MG tablet Take 1 tablet (81 mg total) by mouth daily. Swallow whole.     carbidopa-levodopa (SINEMET IR) 25-100 MG tablet Take 1 tablet by mouth 3 (three) times daily. 270 tablet 3   cetirizine (ZYRTEC) 10 MG tablet Take 10 mg by mouth daily as needed for allergies.     COLLAGEN PO Take 6 tablets by mouth daily.     cyanocobalamin (VITAMIN B12) 1000 MCG tablet Take 1,000 mcg by mouth daily.     donepezil (ARICEPT) 10 MG tablet TAKE 1 TABLET (10 MG TOTAL) BY MOUTH DAILY AFTER SUPPER. APPOINTMENT NEEDED FOR FURTHER REFILLS 90 tablet 1   gabapentin (NEURONTIN) 300 MG capsule Take 300 mg by mouth at bedtime as needed (pain).     inclisiran (LEQVIO) 284 MG/1.5ML SOSY injection Inject at 0, 3 months then q 6 months thereafter 1.5 mL 0   lidocaine (LIDODERM) 5 % Place 1 patch onto the skin daily as needed (pain).     Melatonin 10 MG CAPS Take 20 mg by mouth at bedtime.     metoprolol tartrate (LOPRESSOR) 25 MG tablet Take 0.5 tablets (12.5 mg total) by mouth 2 (two) times daily. 60 tablet 4   omeprazole (PRILOSEC OTC) 20 MG tablet Take 20 mg by mouth in the morning.     Polyvinyl Alcohol-Povidone PF (REFRESH) 1.4-0.6 % SOLN Place 1 drop into both eyes daily as needed (luberication and dry  eyes).     rivaroxaban (XARELTO) 20 MG TABS tablet Take 20 mg by mouth daily with supper.     traMADol (ULTRAM) 50 MG tablet Take 1 tablet (50 mg total) by mouth every 6 (six) hours as needed. 28 tablet 0   No current facility-administered medications for this visit.     Physical Exam: Blood pressure 135/79, pulse 63, weight 159 lb (72.1 kg), SpO2 97%.  Patient alert and comfortable Regular heart rhythm without murmur Lungs clear No ankle edema Wound right thigh now healed and covered with fresh skin tissue  Diagnostic Tests: None  Impression: Right thigh wound healed, no evidence of lymphatic drainage.  Plan: Return in 10 days for final wound check and to discuss resuming Xarelto.   Lovett Sox, MD [facility] [phone]

## 2023-04-08 ENCOUNTER — Encounter: Payer: Self-pay | Admitting: Adult Health

## 2023-04-08 ENCOUNTER — Ambulatory Visit: Payer: Medicare Other | Admitting: Adult Health

## 2023-04-08 VITALS — BP 114/58 | HR 67 | Ht 68.0 in | Wt 154.0 lb

## 2023-04-08 DIAGNOSIS — G20C Parkinsonism, unspecified: Secondary | ICD-10-CM

## 2023-04-08 DIAGNOSIS — R419 Unspecified symptoms and signs involving cognitive functions and awareness: Secondary | ICD-10-CM | POA: Diagnosis not present

## 2023-04-08 NOTE — Progress Notes (Signed)
PATIENT: Terry [name] DOB: Oct 22, 1950  REASON FOR VISIT: follow up HISTORY FROM: patient PRIMARY NEUROLOGIST: Dr. Terrace Arabia   Chief Complaint  Patient presents with   Follow-up    Pt in 4  Pt here for Parkinson and memory f/u Pt states triple bypass in March 2024 Pt states tremors in both hands  Pt states memory same      HISTORY OF PRESENT ILLNESS:  HISTORY (Copied from Dr. Zannie Cove note) Terry [name] is a 72 year old male, seen in request by neurosurgeon Dr. Barnett Abu, for evaluation of possible Parkinson's disease, his primary care physician is Dr. Creola Corn, MD    I reviewed and summarized the referring note. PMHX. Memory loss GERD PE, on xarelto History of kidney and bladder stone. Prostate cancer Cervical decompression surgery Multiple lumbar decompression surgery   Patient noticed right hand shaking around 2022, gradually getting worse, he has mild gait abnormality, but still able to walk up to 5 miles a day, he had multiple lumbar decompression surgery in the past, continued to experience low back pain  In addition he suffered right Achilles tendon injury in the past, postsurgical infection, required prolonged healing, right Achilles tendon continues to bother him intermittently  He denies loss sense of smell, denies significant difficulty sleeping, denies orthostatic hypotension,   His sister of 68 years old, has mild bilateral hand shaking, otherwise there is no family history of tremor   Personally reviewed MRI of the brain in January 2022, no acute intracranial abnormality, old right cerebellar infarction, small vessel disease  Labs in February 2023, normal CBC, CMP,   UPDATE Sept 18 2023: He is accompanied by his wife at today's visit, tolerating Sinemet 25/100 mg 3 times a day well, does notice mild improvement, has less hand shaking,   DaTSCAN May 2023 showed decreased radiotracer activity within striata consistent with Parkinson syndrome pathology,  greater loss activity on the right,   He continue has mild memory loss, MoCA examination today is 18 out of 30, noticeable visual-spatial executive dysfunction, slow reaction time, short-term memory loss, he denies hallucinations, does complains of excessive day time fatigue and drowsiness,   He continue has mild gait abnormality, which is certainly complicated with his worsening low back pain, radiating pain to right lower extremity, we personally reviewed MRI of lumbar spine in September 2023, new right foraminal disc extrusion with superior migration at L2-3 with severe right neuroforaminal stenosis, going to be reevaluated by Dr. Danielle Dess soon     Today 04/08/23 (MM)  Terry [name] is a 72 y.o. male who has been followed in this office for Parkinson's disease and memory disturbance. Returns today for follow-up.  Overall he feels relatively stable.  He did have triple bypass in March.  Denies any changes with his gait or balance.  Reports that tremor is relatively stable.  States that in the last 3 weeks it may have been slightly worse but correlates that with anxiety.  No trouble chewing or swallowing food.  Reports that he is sleeping well.  He states that since his triple bypass he no longer snores does not have any trouble falling asleep.  He does not want to proceed with a sleep study at this time.  He feels that his memory is stable.  Remains on Aricept 10 mg at bedtime    REVIEW OF SYSTEMS: Out of a complete 14 system review of symptoms, the patient complains only of the following symptoms, and all other reviewed systems are negative.  ALLERGIES: Allergies  Allergen Reactions   Lipitor [Atorvastatin] Other (See Comments)    Muscle issues   Penicillins Hives, Itching and Other (See Comments)    HOME MEDICATIONS: Outpatient Medications Prior to Visit  Medication Sig Dispense Refill   acetaminophen (TYLENOL) 500 MG tablet Take 500-1,000 mg by mouth every 6 (six) hours as needed for  moderate pain.     aspirin EC 81 MG tablet Take 1 tablet (81 mg total) by mouth daily. Swallow whole.     carbidopa-levodopa (SINEMET IR) 25-100 MG tablet Take 1 tablet by mouth 3 (three) times daily. 270 tablet 3   cetirizine (ZYRTEC) 10 MG tablet Take 10 mg by mouth daily as needed for allergies.     COLLAGEN PO Take 6 tablets by mouth daily.     cyanocobalamin (VITAMIN B12) 1000 MCG tablet Take 1,000 mcg by mouth daily.     donepezil (ARICEPT) 10 MG tablet TAKE 1 TABLET (10 MG TOTAL) BY MOUTH DAILY AFTER SUPPER. APPOINTMENT NEEDED FOR FURTHER REFILLS 90 tablet 1   gabapentin (NEURONTIN) 300 MG capsule Take 300 mg by mouth at bedtime as needed (pain).     inclisiran (LEQVIO) 284 MG/1.5ML SOSY injection Inject at 0, 3 months then q 6 months thereafter 1.5 mL 0   lidocaine (LIDODERM) 5 % Place 1 patch onto the skin daily as needed (pain).     Melatonin 10 MG CAPS Take 20 mg by mouth at bedtime.     metoprolol tartrate (LOPRESSOR) 25 MG tablet Take 0.5 tablets (12.5 mg total) by mouth 2 (two) times daily. 60 tablet 4   omeprazole (PRILOSEC OTC) 20 MG tablet Take 20 mg by mouth in the morning.     Polyvinyl Alcohol-Povidone PF (REFRESH) 1.4-0.6 % SOLN Place 1 drop into both eyes daily as needed (luberication and dry eyes).     rivaroxaban (XARELTO) 20 MG TABS tablet Take 20 mg by mouth daily with supper.     traMADol (ULTRAM) 50 MG tablet Take 1 tablet (50 mg total) by mouth every 6 (six) hours as needed. 28 tablet 0   No facility-administered medications prior to visit.    PAST MEDICAL HISTORY: Past Medical History:  Diagnosis Date   Arthritis    neck, back, knees, right ankle   Bladder calculus    Chronic back pain    Coronary artery disease    GERD (gastroesophageal reflux disease)    History of colon polyps    benign   History of kidney stones    and bladder stone   History of prostate cancer urologist-- dr Laverle Patter   dx 2014---  s/p  prostatectomy 05-22-2013 ,  Gleason 3+3    Hypercholesterolemia    takes Crestor daily   Hyperlipidemia    Hypertension    Pneumonia 10/2019   Pulmonary emboli (HCC) 10/2019   x2   Wears glasses     PAST SURGICAL HISTORY: Past Surgical History:  Procedure Laterality Date   ACHILLES TENDON SURGERY Right 06-07-2018   @[facility]    reconstruction   ANTERIOR CERVICAL DECOMP/DISCECTOMY FUSION N/A 06/27/2014   Procedure: ANTERIOR CERVICAL DECOMPRESSION/DISCECTOMY FUSION C5-C7   (2 LEVELS);  Surgeon: Venita Lick, MD;  Location: [facility] OR;  Service: Orthopedics;  Laterality: N/A;   back fusion  03/2022   x2   CARDIAC CATHETERIZATION  2004   CLIPPING OF ATRIAL APPENDAGE Left 08/04/2022   Procedure: CLIPPING OF ATRIAL APPENDAGE USING AN ATRICURE 45 ATRICLIP;  Surgeon: Lyn Hollingshead, MD;  Location: [facility] OR;  Service: Open Heart Surgery;  Laterality: Left;   COLONOSCOPY     CORONARY ARTERY BYPASS GRAFT N/A 08/04/2022   Procedure: CORONARY ARTERY BYPASS GRAFTING (CABG) X THREE USING LEFT INTERNAL MAMMARY ARTERY (LIMA) AND ENDOSCOPICALLY HARVESTED RIGHT GREATER SAPHENOUS VEIN.;  Surgeon: Lyn Hollingshead, MD;  Location: [facility] OR;  Service: Open Heart Surgery;  Laterality: N/A;   CYSTOSCOPY WITH LITHOLAPAXY N/A 03/27/2019   Procedure: CYSTOSCOPY WITH REMOVAL OF BLADDER STONE AND FOREIGN BODY;  Surgeon: Heloise Purpura, MD;  Location[facility];  Service: Urology;  Laterality: N/A;   EYE SURGERY     lazy eye as child, lasik surgery   FINGER SURGERY Right    4th finger   I & D EXTREMITY Right 07/10/2018   Procedure: IRRIGATION AND DEBRIDEMENT RIGHT ANKLE WOUND AND WOUND VAC PLACEMENT;  Surgeon: Toni Arthurs, MD;  Location: [facility] OR;  Service: Orthopedics;  Laterality: Right;   I & D EXTREMITY Right 01/14/2023   Procedure: IRRIGATION AND DEBRIDEMENT RIGHT LOWER EXTREMITY SEROMA;  Surgeon: Corliss Skains, MD;  Location: [facility] OR;  Service: Vascular;  Laterality: Right;   KNEE ARTHROSCOPY Bilateral right ?;  left 05-25-2007  @[facility]    LEFT  HEART CATH AND CORONARY ANGIOGRAPHY N/A 07/20/2022   Procedure: LEFT HEART CATH AND CORONARY ANGIOGRAPHY;  Surgeon: Runell Gess, MD;  Location: [facility] INVASIVE CV LAB;  Service: Cardiovascular;  Laterality: N/A;   POSTERIOR LAMINECTOMY / DECOMPRESSION LUMBAR SPINE  08-31-2016    dr elsner  @[facility]    PROSTATE BIOPSY  04/13/2013   gleason 3+3=6, vol 55.4 cc   ROBOT ASSISTED LAPAROSCOPIC RADICAL PROSTATECTOMY N/A 05/22/2013   Procedure: ROBOTIC ASSISTED LAPAROSCOPIC RADICAL PROSTATECTOMY LEVEL 1;  Surgeon: [name], MD;  Location: [facility] ORS;  Service: Urology;  Laterality: N/A;   TEE WITHOUT CARDIOVERSION N/A 08/04/2022   Procedure: TRANSESOPHAGEAL ECHOCARDIOGRAM;  Surgeon: Lyn Hollingshead, MD;  Location: [facility] OR;  Service: Open Heart Surgery;  Laterality: N/A;   TONSILLECTOMY      FAMILY HISTORY: Family History  Problem Relation Age of Onset   Pneumonia Mother        bacterial   Dementia Mother    Heart disease Father    Heart attack Father    Colon cancer Neg Hx    Sleep apnea Neg Hx    Parkinson's disease Neg Hx     SOCIAL HISTORY: Social History   Socioeconomic History   Marital status: Married    Spouse name: Alvino Chapel   Number of children: 2   Years of education: Not on file   Highest education level: Not on file  Occupational History   Occupation: retired  Tobacco Use   Smoking status: Never   Smokeless tobacco: Never  Vaping Use   Vaping status: Never Used  Substance and Sexual Activity   Alcohol use: Not Currently    Comment: maybe 1 beer per month   Drug use: Never   Sexual activity: Not Currently    Comment: vasectomy  Other Topics Concern   Not on file  Social History Narrative   Not on file   Social Determinants of Health   Financial Resource Strain: Not on file  Food Insecurity: No Food Insecurity (08/08/2022)   Hunger Vital Sign    Worried About Running Out of Food in the Last Year: Never true    Ran Out of Food in the Last Year: Never true  Transportation  Needs: No Transportation Needs (08/08/2022)   PRAPARE - Transportation  Lack of Transportation (Medical): No    Lack of Transportation (Non-Medical): No  Physical Activity: Not on file  Stress: Not on file  Social Connections: Unknown (10/14/2021)   [facility]   Social Network    Social Network: Not on file  Intimate Partner Violence: Not At Risk (08/08/2022)   Humiliation, Afraid, Rape, and Kick questionnaire    Fear of Current or Ex-Partner: No    Emotionally Abused: No    Physically Abused: No    Sexually Abused: No      PHYSICAL EXAM  Vitals:   04/08/23 0916  BP: (!) 114/58  Pulse: 67  Weight: 154 lb (69.9 kg)  Height: 5\' 8"  (1.727 m)   Body mass index is 23.42 kg/m.     04/08/2023    9:19 AM 02/16/2022   10:00 AM 09/29/2021    9:49 AM  Montreal Cognitive Assessment   Visuospatial/ Executive (0/5) 1 1 2   Naming (0/3) 3 3 3   Attention: Read list of digits (0/2) 2 2 2   Attention: Read list of letters (0/1) 1 1 1   Attention: Serial 7 subtraction starting at 100 (0/3) 2 1 2   Language: Repeat phrase (0/2) 2 2 1   Language : Fluency (0/1) 1 1 1   Abstraction (0/2) 2 2 2   Delayed Recall (0/5) 3 0 0  Orientation (0/6) 5 5 4   Total 22 18 18   Adjusted Score (based on education)   18     Generalized: Well developed, in no acute distress   Neurological examination  Mentation: Alert oriented to time, place, history taking. Follows all commands speech and language fluent Cranial nerve II-XII: Pupils were equal round reactive to light. Extraocular movements were full, visual field were full on confrontational test. Facial sensation and strength were normal. . Head turning and shoulder shrug  were normal and symmetric. Motor: The motor testing reveals 5 over 5 strength of all 4 extremities. Good symmetric motor tone is noted throughout.  Finger taps moderately impaired bilaterally.  Mild impairment of rapid alternating movements Sensory: Sensory  testing is intact to soft touch on all 4 extremities. No evidence of extinction is noted.  Coordination: Cerebellar testing reveals good finger-nose-finger and heel-to-shin bilaterally.  Gait and station: Able to stand without assistance.  Good stride.  2-3 steps with turns. Reflexes: Deep tendon reflexes are symmetric and normal bilaterally.   DIAGNOSTIC DATA (LABS, IMAGING, TESTING) - I reviewed patient records, labs, notes, testing and imaging myself where available.  Lab Results  Component Value Date   WBC 12.5 (H) 01/12/2023   HGB 13.7 01/12/2023   HCT 44.9 01/12/2023   MCV 75.2 (L) 01/12/2023   PLT 374 01/12/2023      Component Value Date/Time   NA 139 01/12/2023 1458   NA 140 07/09/2022 1056   K 4.2 01/12/2023 1458   CL 101 01/12/2023 1458   CO2 27 01/12/2023 1458   GLUCOSE 120 (H) 01/12/2023 1458   BUN 25 (H) 01/12/2023 1458   BUN 19 07/09/2022 1056   CREATININE 0.95 01/12/2023 1458   CREATININE 1.02 07/07/2021 1406   CALCIUM 9.4 01/12/2023 1458   PROT 6.7 01/12/2023 1458   ALBUMIN 3.7 01/12/2023 1458   AST 19 01/12/2023 1458   AST 24 07/07/2021 1406   ALT 17 01/12/2023 1458   ALT 25 07/07/2021 1406   ALKPHOS 79 01/12/2023 1458   BILITOT 0.6 01/12/2023 1458   BILITOT 0.8 07/07/2021 1406   GFRNONAA >60 01/12/2023 1458  GFRNONAA >60 07/07/2021 1406   GFRAA >60 07/13/2018 0308   Lab Results  Component Value Date   CHOL 68 (L) 12/10/2022   HDL 46 12/10/2022   LDLCALC 0 12/10/2022   TRIG 121 12/10/2022   CHOLHDL 1.5 12/10/2022   Lab Results  Component Value Date   HGBA1C 6.2 (H) 07/31/2022   Lab Results  Component Value Date   VITAMINB12 237 09/29/2021   Lab Results  Component Value Date   TSH 2.520 09/29/2021      ASSESSMENT AND PLAN 72 y.o. year old male  has a past medical history of Arthritis, Bladder calculus, Chronic back pain, Coronary artery disease, GERD (gastroesophageal reflux disease), History of colon polyps, History of kidney stones,  History of prostate cancer (urologist-- dr Laverle Patter), Hypercholesterolemia, Hyperlipidemia, Hypertension, Pneumonia (10/2019), Pulmonary emboli (HCC) (10/2019), and Wears glasses. here with:  1.  Parkinson's disease 2.  Memory disturbance  -Continue Sinemet 25-100 mg 3 times daily -Memory score stable -Continue Aricept 10 mg at bedtime -Advised if symptoms worsen or she develops new symptoms she should let us know -Follow-up in 1 year or sooner if needed   Butch Penny, MSN, NP-C 04/08/2023, 9:39 AM [facility] [address], [address] [place], [place] [address] [phone]

## 2023-04-12 DIAGNOSIS — E785 Hyperlipidemia, unspecified: Secondary | ICD-10-CM | POA: Diagnosis not present

## 2023-04-12 DIAGNOSIS — R739 Hyperglycemia, unspecified: Secondary | ICD-10-CM | POA: Diagnosis not present

## 2023-04-12 DIAGNOSIS — I2510 Atherosclerotic heart disease of native coronary artery without angina pectoris: Secondary | ICD-10-CM | POA: Diagnosis not present

## 2023-04-12 DIAGNOSIS — Z79899 Other long term (current) drug therapy: Secondary | ICD-10-CM | POA: Diagnosis not present

## 2023-04-12 DIAGNOSIS — Z1212 Encounter for screening for malignant neoplasm of rectum: Secondary | ICD-10-CM | POA: Diagnosis not present

## 2023-04-12 DIAGNOSIS — E538 Deficiency of other specified B group vitamins: Secondary | ICD-10-CM | POA: Diagnosis not present

## 2023-04-13 DIAGNOSIS — H43812 Vitreous degeneration, left eye: Secondary | ICD-10-CM | POA: Diagnosis not present

## 2023-04-13 DIAGNOSIS — H35371 Puckering of macula, right eye: Secondary | ICD-10-CM | POA: Diagnosis not present

## 2023-04-13 DIAGNOSIS — H43822 Vitreomacular adhesion, left eye: Secondary | ICD-10-CM | POA: Diagnosis not present

## 2023-04-13 LAB — LAB REPORT - SCANNED
A1c: 6
EGFR: 82.5

## 2023-04-19 ENCOUNTER — Encounter: Payer: Self-pay | Admitting: Cardiothoracic Surgery

## 2023-04-19 ENCOUNTER — Ambulatory Visit (INDEPENDENT_AMBULATORY_CARE_PROVIDER_SITE_OTHER): Payer: Medicare Other | Admitting: Cardiothoracic Surgery

## 2023-04-19 VITALS — BP 123/77 | HR 69 | Resp 18 | Ht 68.0 in | Wt 155.0 lb

## 2023-04-19 DIAGNOSIS — R413 Other amnesia: Secondary | ICD-10-CM | POA: Diagnosis not present

## 2023-04-19 DIAGNOSIS — K760 Fatty (change of) liver, not elsewhere classified: Secondary | ICD-10-CM | POA: Diagnosis not present

## 2023-04-19 DIAGNOSIS — D692 Other nonthrombocytopenic purpura: Secondary | ICD-10-CM | POA: Diagnosis not present

## 2023-04-19 DIAGNOSIS — M25571 Pain in right ankle and joints of right foot: Secondary | ICD-10-CM | POA: Diagnosis not present

## 2023-04-19 DIAGNOSIS — Z951 Presence of aortocoronary bypass graft: Secondary | ICD-10-CM

## 2023-04-19 DIAGNOSIS — I2510 Atherosclerotic heart disease of native coronary artery without angina pectoris: Secondary | ICD-10-CM | POA: Diagnosis not present

## 2023-04-19 DIAGNOSIS — R82998 Other abnormal findings in urine: Secondary | ICD-10-CM | POA: Diagnosis not present

## 2023-04-19 DIAGNOSIS — Z1331 Encounter for screening for depression: Secondary | ICD-10-CM | POA: Diagnosis not present

## 2023-04-19 DIAGNOSIS — M549 Dorsalgia, unspecified: Secondary | ICD-10-CM | POA: Diagnosis not present

## 2023-04-19 DIAGNOSIS — E785 Hyperlipidemia, unspecified: Secondary | ICD-10-CM | POA: Diagnosis not present

## 2023-04-19 DIAGNOSIS — Z5189 Encounter for other specified aftercare: Secondary | ICD-10-CM

## 2023-04-19 DIAGNOSIS — Z0000 Encounter for general adult medical examination without abnormal findings: Secondary | ICD-10-CM | POA: Diagnosis not present

## 2023-04-19 DIAGNOSIS — M1990 Unspecified osteoarthritis, unspecified site: Secondary | ICD-10-CM | POA: Diagnosis not present

## 2023-04-19 DIAGNOSIS — I700 Atherosclerosis of aorta: Secondary | ICD-10-CM | POA: Diagnosis not present

## 2023-04-19 DIAGNOSIS — G20A1 Parkinson's disease without dyskinesia, without mention of fluctuations: Secondary | ICD-10-CM | POA: Diagnosis not present

## 2023-04-19 DIAGNOSIS — Z23 Encounter for immunization: Secondary | ICD-10-CM | POA: Diagnosis not present

## 2023-04-19 DIAGNOSIS — Z1339 Encounter for screening examination for other mental health and behavioral disorders: Secondary | ICD-10-CM | POA: Diagnosis not present

## 2023-04-19 DIAGNOSIS — R739 Hyperglycemia, unspecified: Secondary | ICD-10-CM | POA: Diagnosis not present

## 2023-04-19 NOTE — Progress Notes (Signed)
HPI: The patient returns for scheduled follow-up visit of the open wound in his right thigh at the previous saphenous vein harvest site.  The wound is fully healed and there is no lymphatic drainage.  There is a portion of a nylon suture which is surfaced from the subcutaneous layer and evident.  This was probably placed as a ligature over the site of lymphatic drainage. Using sterile technique and topical anesthesia the suture was removed in its entirety.  There is minimal bleeding and a plain gauze with tape was applied.       Current Outpatient Medications  Medication Sig Dispense Refill   acetaminophen (TYLENOL) 500 MG tablet Take 500-1,000 mg by mouth every 6 (six) hours as needed for moderate pain.     aspirin EC 81 MG tablet Take 1 tablet (81 mg total) by mouth daily. Swallow whole.     carbidopa-levodopa (SINEMET IR) 25-100 MG tablet Take 1 tablet by mouth 3 (three) times daily. 270 tablet 3   cetirizine (ZYRTEC) 10 MG tablet Take 10 mg by mouth daily as needed for allergies.     COLLAGEN PO Take 6 tablets by mouth daily.     cyanocobalamin (VITAMIN B12) 1000 MCG tablet Take 1,000 mcg by mouth daily.     donepezil (ARICEPT) 10 MG tablet TAKE 1 TABLET (10 MG TOTAL) BY MOUTH DAILY AFTER SUPPER. APPOINTMENT NEEDED FOR FURTHER REFILLS 90 tablet 1   gabapentin (NEURONTIN) 300 MG capsule Take 300 mg by mouth at bedtime as needed (pain).     inclisiran (LEQVIO) 284 MG/1.5ML SOSY injection Inject at 0, 3 months then q 6 months thereafter 1.5 mL 0   lidocaine (LIDODERM) 5 % Place 1 patch onto the skin daily as needed (pain).     Melatonin 10 MG CAPS Take 20 mg by mouth at bedtime.     metoprolol tartrate (LOPRESSOR) 25 MG tablet Take 0.5 tablets (12.5 mg total) by mouth 2 (two) times daily. 60 tablet 4   omeprazole (PRILOSEC OTC) 20 MG tablet Take 20 mg by mouth in the morning.     Polyvinyl Alcohol-Povidone PF (REFRESH) 1.4-0.6 % SOLN Place 1 drop into both eyes daily as needed (luberication  and dry eyes).     rivaroxaban (XARELTO) 20 MG TABS tablet Take 20 mg by mouth daily with supper.     traMADol (ULTRAM) 50 MG tablet Take 1 tablet (50 mg total) by mouth every 6 (six) hours as needed. 28 tablet 0   No current facility-administered medications for this visit.     Physical Exam: Vitals:   04/19/23 1349  BP: 123/77  Pulse: 69  Resp: 18  SpO2: 96%  Patient is alert and comfortable The right lower medial thigh wound is completely healed There is no distal pedal edema Heart rate is regular without murmur , Incision well-healed    Diagnostic Tests: None  Impression: Healed right thigh wound after surgical debridement of a large lymphocele  Plan: Normal skin No restrictions on activity Return as needed Lovett Sox, MD [facility] [phone]

## 2023-05-01 DIAGNOSIS — R059 Cough, unspecified: Secondary | ICD-10-CM | POA: Diagnosis not present

## 2023-05-01 DIAGNOSIS — Z20822 Contact with and (suspected) exposure to covid-19: Secondary | ICD-10-CM | POA: Diagnosis not present

## 2023-05-01 DIAGNOSIS — J069 Acute upper respiratory infection, unspecified: Secondary | ICD-10-CM | POA: Diagnosis not present

## 2023-05-05 DIAGNOSIS — G20A1 Parkinson's disease without dyskinesia, without mention of fluctuations: Secondary | ICD-10-CM | POA: Diagnosis not present

## 2023-05-05 DIAGNOSIS — R0981 Nasal congestion: Secondary | ICD-10-CM | POA: Diagnosis not present

## 2023-05-05 DIAGNOSIS — Z86711 Personal history of pulmonary embolism: Secondary | ICD-10-CM | POA: Diagnosis not present

## 2023-05-05 DIAGNOSIS — J069 Acute upper respiratory infection, unspecified: Secondary | ICD-10-CM | POA: Diagnosis not present

## 2023-05-05 DIAGNOSIS — Z7901 Long term (current) use of anticoagulants: Secondary | ICD-10-CM | POA: Diagnosis not present

## 2023-05-05 DIAGNOSIS — R030 Elevated blood-pressure reading, without diagnosis of hypertension: Secondary | ICD-10-CM | POA: Diagnosis not present

## 2023-05-05 DIAGNOSIS — R051 Acute cough: Secondary | ICD-10-CM | POA: Diagnosis not present

## 2023-05-28 DIAGNOSIS — H52203 Unspecified astigmatism, bilateral: Secondary | ICD-10-CM | POA: Diagnosis not present

## 2023-05-28 DIAGNOSIS — H53002 Unspecified amblyopia, left eye: Secondary | ICD-10-CM | POA: Diagnosis not present

## 2023-05-28 DIAGNOSIS — Z961 Presence of intraocular lens: Secondary | ICD-10-CM | POA: Diagnosis not present

## 2023-05-28 DIAGNOSIS — H5340 Unspecified visual field defects: Secondary | ICD-10-CM | POA: Diagnosis not present

## 2023-05-28 DIAGNOSIS — H35371 Puckering of macula, right eye: Secondary | ICD-10-CM | POA: Diagnosis not present

## 2023-06-10 ENCOUNTER — Telehealth: Payer: Self-pay

## 2023-06-10 NOTE — Telephone Encounter (Signed)
 Auth Submission: NO AUTH NEEDED Site of care: Site of care: [facility] Payer: medicare a.b & supp Medication & CPT/J Code(s) submitted: Leqvio  (Inclisiran) J1306 Route of submission (phone, fax, portal):  Phone # Fax # Auth type: Buy/Bill Units/visits requested: 284mg  x 2 doses Reference number:  Approval from: 10/07/22 to 07/01/24

## 2023-06-18 DIAGNOSIS — M545 Low back pain, unspecified: Secondary | ICD-10-CM | POA: Diagnosis not present

## 2023-07-21 ENCOUNTER — Ambulatory Visit: Payer: Medicare Other

## 2023-07-26 ENCOUNTER — Ambulatory Visit (INDEPENDENT_AMBULATORY_CARE_PROVIDER_SITE_OTHER): Payer: Medicare Other

## 2023-07-26 VITALS — BP 155/85 | HR 63 | Temp 97.6°F | Resp 14 | Ht 68.0 in | Wt 170.6 lb

## 2023-07-26 DIAGNOSIS — E782 Mixed hyperlipidemia: Secondary | ICD-10-CM | POA: Diagnosis not present

## 2023-07-26 DIAGNOSIS — I2510 Atherosclerotic heart disease of native coronary artery without angina pectoris: Secondary | ICD-10-CM | POA: Diagnosis not present

## 2023-07-26 MED ORDER — INCLISIRAN SODIUM 284 MG/1.5ML ~~LOC~~ SOSY
284.0000 mg | PREFILLED_SYRINGE | Freq: Once | SUBCUTANEOUS | Status: AC
  Administered 2023-07-26: 284 mg via SUBCUTANEOUS
  Filled 2023-07-26: qty 1.5

## 2023-07-26 NOTE — Progress Notes (Signed)
 Diagnosis: Hyperlipidemia  Provider:  Chilton Greathouse MD  Procedure: Injection  Leqvio (inclisiran), Dose: 284 mg, Site: subcutaneous, Number of injections: 1  Injection Site(s): Right arm  Post Care: Patient declined observation  Discharge: Condition: Good, Destination: Home . AVS Provided  Performed by:  Wyvonne Lenz, RN

## 2023-08-06 ENCOUNTER — Encounter: Payer: Self-pay | Admitting: Cardiovascular Disease

## 2023-08-06 ENCOUNTER — Ambulatory Visit: Payer: Medicare Other | Attending: Cardiovascular Disease | Admitting: Cardiovascular Disease

## 2023-08-06 VITALS — BP 149/69 | HR 52 | Ht 68.0 in | Wt 171.0 lb

## 2023-08-06 DIAGNOSIS — Z951 Presence of aortocoronary bypass graft: Secondary | ICD-10-CM | POA: Insufficient documentation

## 2023-08-06 DIAGNOSIS — I10 Essential (primary) hypertension: Secondary | ICD-10-CM | POA: Diagnosis not present

## 2023-08-06 DIAGNOSIS — I2699 Other pulmonary embolism without acute cor pulmonale: Secondary | ICD-10-CM | POA: Insufficient documentation

## 2023-08-06 DIAGNOSIS — E782 Mixed hyperlipidemia: Secondary | ICD-10-CM | POA: Insufficient documentation

## 2023-08-06 NOTE — Assessment & Plan Note (Signed)
 History of remote pulmonary embolism years ago after an Achilles tendon operation.  He was on Xarelto at the time.  I do not think there is any benefit for him to continue this at this point.

## 2023-08-06 NOTE — Assessment & Plan Note (Signed)
 History of hyperlipidemia on inclisiran with lipid profile performed 04/12/2023 revealing total cholesterol 124, LDL 27 and HDL 48.

## 2023-08-06 NOTE — Assessment & Plan Note (Signed)
 History of CAD status post coronary CTA performed 07/15/2022 revealing a coronary calcium score of 507 with significant disease in the RCA by FFR analysis.  Based on this I proceeded with outpatient radial diagnostic cath 07/20/2022 revealing high-grade LAD/diagonal branch bifurcation disease and high-grade dominant RCA disease in the proximal, mid and distal segment.  Based on this I recommended that he undergo coronary artery bypass grafting which he had by Dr. Delia Chimes 08/04/2022.  He had a LIMA to his LAD, vein to diagonal branch and PDA.  He did complete cardiac rehab at [facility] regional which he said was beneficial.  His major issue has been with wound dehiscence in his right thigh which is ultimately healed.
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
## 2023-08-06 NOTE — Patient Instructions (Signed)
 Medication Instructions:  Your physician recommends that you continue on your current medications as directed. Please refer to the Current Medication list given to you today.  *If you need a refill on your cardiac medications before your next appointment, please call your pharmacy*   Follow-Up: At [facility], you and your health needs are our priority.  As part of our continuing mission to provide you with exceptional heart care, we have created designated Provider Care Teams.  These Care Teams include your primary Cardiologist (physician) and Advanced Practice Providers (APPs -  Physician Assistants and Nurse Practitioners) who all work together to provide you with the care you need, when you need it.  We recommend signing up for the patient portal called "MyChart".  Sign up information is provided on this After Visit Summary.  MyChart is used to connect with patients for Virtual Visits (Telemedicine).  Patients are able to view lab/test results, encounter notes, upcoming appointments, etc.  Non-urgent messages can be sent to your provider as well.   To learn more about what you can do with MyChart, go to [email or web address].    Your next appointment:   6 month(s)  Provider:   Marjie Skiff, PA-C, Robet Leu, PA-C, Azalee Course, PA-C, Bernadene Person, NP, or Reather Littler, NP       Then, Nanetta Batty, MD will plan to see you again in 12 month(s).    Other Instructions   1st Floor: - Lobby - Registration  - Pharmacy  - Lab - Cafe  2nd Floor: - PV Lab - Diagnostic Testing (echo, CT, nuclear med)  3rd Floor: - Vacant  4th Floor: - TCTS (cardiothoracic surgery) - AFib Clinic - Structural Heart Clinic - Vascular Surgery  - Vascular Ultrasound  5th Floor: - [facility] (general and EP) - Clinical Pharmacy for coumadin, hypertension, lipid, weight-loss medications, and med management appointments    Valet parking services will be available as well.

## 2023-08-06 NOTE — Assessment & Plan Note (Signed)
 History of essential hypertension her blood pressure measured today at 149/69.  He is on metoprolol.

## 2023-08-11 DIAGNOSIS — M5023 Other cervical disc displacement, cervicothoracic region: Secondary | ICD-10-CM | POA: Diagnosis not present

## 2023-08-11 DIAGNOSIS — Z6825 Body mass index (BMI) 25.0-25.9, adult: Secondary | ICD-10-CM | POA: Diagnosis not present

## 2023-08-11 DIAGNOSIS — M5416 Radiculopathy, lumbar region: Secondary | ICD-10-CM | POA: Diagnosis not present

## 2023-08-11 DIAGNOSIS — M4316 Spondylolisthesis, lumbar region: Secondary | ICD-10-CM | POA: Diagnosis not present

## 2023-09-16 DIAGNOSIS — M5116 Intervertebral disc disorders with radiculopathy, lumbar region: Secondary | ICD-10-CM | POA: Diagnosis not present

## 2023-09-16 DIAGNOSIS — M5416 Radiculopathy, lumbar region: Secondary | ICD-10-CM | POA: Diagnosis not present

## 2023-09-30 ENCOUNTER — Other Ambulatory Visit: Payer: Self-pay | Admitting: Neurology

## 2023-10-15 DIAGNOSIS — H43822 Vitreomacular adhesion, left eye: Secondary | ICD-10-CM | POA: Diagnosis not present

## 2023-10-15 DIAGNOSIS — H35371 Puckering of macula, right eye: Secondary | ICD-10-CM | POA: Diagnosis not present

## 2023-10-15 DIAGNOSIS — H43812 Vitreous degeneration, left eye: Secondary | ICD-10-CM | POA: Diagnosis not present

## 2023-10-29 DIAGNOSIS — R7989 Other specified abnormal findings of blood chemistry: Secondary | ICD-10-CM | POA: Diagnosis not present

## 2023-10-29 DIAGNOSIS — R413 Other amnesia: Secondary | ICD-10-CM | POA: Diagnosis not present

## 2023-10-29 DIAGNOSIS — G20A1 Parkinson's disease without dyskinesia, without mention of fluctuations: Secondary | ICD-10-CM | POA: Diagnosis not present

## 2023-10-29 DIAGNOSIS — R030 Elevated blood-pressure reading, without diagnosis of hypertension: Secondary | ICD-10-CM | POA: Diagnosis not present

## 2023-10-29 DIAGNOSIS — E538 Deficiency of other specified B group vitamins: Secondary | ICD-10-CM | POA: Diagnosis not present

## 2023-10-29 DIAGNOSIS — Z86711 Personal history of pulmonary embolism: Secondary | ICD-10-CM | POA: Diagnosis not present

## 2023-10-29 DIAGNOSIS — M549 Dorsalgia, unspecified: Secondary | ICD-10-CM | POA: Diagnosis not present

## 2023-10-29 DIAGNOSIS — E785 Hyperlipidemia, unspecified: Secondary | ICD-10-CM | POA: Diagnosis not present

## 2023-10-29 DIAGNOSIS — I6990 Unspecified sequelae of unspecified cerebrovascular disease: Secondary | ICD-10-CM | POA: Diagnosis not present

## 2023-10-29 DIAGNOSIS — I700 Atherosclerosis of aorta: Secondary | ICD-10-CM | POA: Diagnosis not present

## 2023-10-29 DIAGNOSIS — K760 Fatty (change of) liver, not elsewhere classified: Secondary | ICD-10-CM | POA: Diagnosis not present

## 2023-10-29 DIAGNOSIS — M25571 Pain in right ankle and joints of right foot: Secondary | ICD-10-CM | POA: Diagnosis not present

## 2023-10-29 DIAGNOSIS — I2510 Atherosclerotic heart disease of native coronary artery without angina pectoris: Secondary | ICD-10-CM | POA: Diagnosis not present

## 2023-10-29 DIAGNOSIS — M1990 Unspecified osteoarthritis, unspecified site: Secondary | ICD-10-CM | POA: Diagnosis not present

## 2023-10-29 DIAGNOSIS — F430 Acute stress reaction: Secondary | ICD-10-CM | POA: Diagnosis not present

## 2023-11-15 DIAGNOSIS — L820 Inflamed seborrheic keratosis: Secondary | ICD-10-CM | POA: Diagnosis not present

## 2023-11-15 DIAGNOSIS — Z85828 Personal history of other malignant neoplasm of skin: Secondary | ICD-10-CM | POA: Diagnosis not present

## 2023-11-15 DIAGNOSIS — L565 Disseminated superficial actinic porokeratosis (DSAP): Secondary | ICD-10-CM | POA: Diagnosis not present

## 2023-11-15 DIAGNOSIS — L814 Other melanin hyperpigmentation: Secondary | ICD-10-CM | POA: Diagnosis not present

## 2023-11-15 DIAGNOSIS — L821 Other seborrheic keratosis: Secondary | ICD-10-CM | POA: Diagnosis not present

## 2023-11-15 DIAGNOSIS — D692 Other nonthrombocytopenic purpura: Secondary | ICD-10-CM | POA: Diagnosis not present

## 2023-11-15 DIAGNOSIS — L218 Other seborrheic dermatitis: Secondary | ICD-10-CM | POA: Diagnosis not present

## 2024-01-06 DIAGNOSIS — M5416 Radiculopathy, lumbar region: Secondary | ICD-10-CM | POA: Diagnosis not present

## 2024-01-06 DIAGNOSIS — M5116 Intervertebral disc disorders with radiculopathy, lumbar region: Secondary | ICD-10-CM | POA: Diagnosis not present

## 2024-01-06 DIAGNOSIS — M5117 Intervertebral disc disorders with radiculopathy, lumbosacral region: Secondary | ICD-10-CM | POA: Diagnosis not present

## 2024-01-24 ENCOUNTER — Ambulatory Visit: Payer: Medicare Other

## 2024-01-24 VITALS — BP 146/83 | HR 61 | Temp 97.7°F | Resp 20 | Ht 67.5 in | Wt 164.0 lb

## 2024-01-24 DIAGNOSIS — E782 Mixed hyperlipidemia: Secondary | ICD-10-CM | POA: Diagnosis not present

## 2024-01-24 DIAGNOSIS — I2510 Atherosclerotic heart disease of native coronary artery without angina pectoris: Secondary | ICD-10-CM | POA: Diagnosis not present

## 2024-01-24 MED ORDER — INCLISIRAN SODIUM 284 MG/1.5ML ~~LOC~~ SOSY
284.0000 mg | PREFILLED_SYRINGE | Freq: Once | SUBCUTANEOUS | Status: AC
  Administered 2024-01-24: 284 mg via SUBCUTANEOUS
  Filled 2024-01-24: qty 1.5

## 2024-01-24 NOTE — Progress Notes (Signed)
 Diagnosis: Hyperlipidemia  Provider:  Chilton Greathouse MD  Procedure: Injection  Leqvio (inclisiran), Dose: 284 mg, Site: subcutaneous, Number of injections: 1  Injection Site(s): Right arm  Post Care:  right arm injection  Discharge: Condition: Good, Destination: Home . AVS Declined  Performed by:  Rico Ala, LPN

## 2024-02-03 ENCOUNTER — Encounter: Payer: Self-pay | Admitting: Cardiovascular Disease

## 2024-03-25 ENCOUNTER — Encounter (HOSPITAL_BASED_OUTPATIENT_CLINIC_OR_DEPARTMENT_OTHER): Payer: Self-pay | Admitting: Emergency Medicine

## 2024-03-25 ENCOUNTER — Other Ambulatory Visit: Payer: Self-pay

## 2024-03-25 ENCOUNTER — Emergency Department (HOSPITAL_BASED_OUTPATIENT_CLINIC_OR_DEPARTMENT_OTHER): Admission: EM | Admit: 2024-03-25 | Discharge: 2024-03-25 | Disposition: A | Discharge status: Home / Self Care

## 2024-03-25 ENCOUNTER — Emergency Department (HOSPITAL_BASED_OUTPATIENT_CLINIC_OR_DEPARTMENT_OTHER)

## 2024-03-25 DIAGNOSIS — S50811A Abrasion of right forearm, initial encounter: Secondary | ICD-10-CM | POA: Insufficient documentation

## 2024-03-25 DIAGNOSIS — W010XXA Fall on same level from slipping, tripping and stumbling without subsequent striking against object, initial encounter: Secondary | ICD-10-CM | POA: Insufficient documentation

## 2024-03-25 DIAGNOSIS — M47812 Spondylosis without myelopathy or radiculopathy, cervical region: Secondary | ICD-10-CM | POA: Diagnosis not present

## 2024-03-25 DIAGNOSIS — I2510 Atherosclerotic heart disease of native coronary artery without angina pectoris: Secondary | ICD-10-CM | POA: Insufficient documentation

## 2024-03-25 DIAGNOSIS — Y9301 Activity, walking, marching and hiking: Secondary | ICD-10-CM | POA: Diagnosis not present

## 2024-03-25 DIAGNOSIS — Z7982 Long term (current) use of aspirin: Secondary | ICD-10-CM | POA: Insufficient documentation

## 2024-03-25 DIAGNOSIS — S51811A Laceration without foreign body of right forearm, initial encounter: Secondary | ICD-10-CM | POA: Diagnosis not present

## 2024-03-25 DIAGNOSIS — R220 Localized swelling, mass and lump, head: Secondary | ICD-10-CM | POA: Diagnosis not present

## 2024-03-25 DIAGNOSIS — W19XXXA Unspecified fall, initial encounter: Secondary | ICD-10-CM

## 2024-03-25 DIAGNOSIS — S199XXA Unspecified injury of neck, initial encounter: Secondary | ICD-10-CM | POA: Diagnosis not present

## 2024-03-25 DIAGNOSIS — S0990XA Unspecified injury of head, initial encounter: Secondary | ICD-10-CM

## 2024-03-25 DIAGNOSIS — G20C Parkinsonism, unspecified: Secondary | ICD-10-CM | POA: Insufficient documentation

## 2024-03-25 DIAGNOSIS — Y92007 Garden or yard of unspecified non-institutional (private) residence as the place of occurrence of the external cause: Secondary | ICD-10-CM | POA: Insufficient documentation

## 2024-03-25 DIAGNOSIS — S0993XA Unspecified injury of face, initial encounter: Secondary | ICD-10-CM | POA: Diagnosis present

## 2024-03-25 DIAGNOSIS — M25561 Pain in right knee: Secondary | ICD-10-CM | POA: Diagnosis not present

## 2024-03-25 DIAGNOSIS — Z87442 Personal history of urinary calculi: Secondary | ICD-10-CM | POA: Insufficient documentation

## 2024-03-25 DIAGNOSIS — S0181XA Laceration without foreign body of other part of head, initial encounter: Secondary | ICD-10-CM | POA: Insufficient documentation

## 2024-03-25 DIAGNOSIS — Z8546 Personal history of malignant neoplasm of prostate: Secondary | ICD-10-CM | POA: Diagnosis not present

## 2024-03-25 DIAGNOSIS — M1711 Unilateral primary osteoarthritis, right knee: Secondary | ICD-10-CM | POA: Diagnosis not present

## 2024-03-25 DIAGNOSIS — I10 Essential (primary) hypertension: Secondary | ICD-10-CM | POA: Diagnosis not present

## 2024-03-25 MED ORDER — CEPHALEXIN 500 MG PO CAPS
500.0000 mg | ORAL_CAPSULE | Freq: Three times a day (TID) | ORAL | 0 refills | Status: AC
Start: 2024-03-25 — End: 2024-04-01

## 2024-03-25 MED ORDER — LIDOCAINE-EPINEPHRINE (PF) 2 %-1:200000 IJ SOLN
10.0000 mL | Freq: Once | INTRAMUSCULAR | Status: AC
  Administered 2024-03-25: 10 mL
  Filled 2024-03-25: qty 20

## 2024-03-25 MED ORDER — BACITRACIN ZINC 500 UNIT/GM EX OINT
TOPICAL_OINTMENT | Freq: Two times a day (BID) | CUTANEOUS | Status: DC
  Filled 2024-03-25: qty 28.35

## 2024-03-25 NOTE — ED Provider Notes (Signed)
 [facility] Provider Note   CSN: [id number] Arrival date & time: 03/25/24  1508     Patient presents with: Terry [name] is a 73 y.o. male.  {Add pertinent medical, surgical, social history, OB history to YEP:67052} The history is provided by the patient and the spouse. No language interpreter was used.       Prior to Admission medications   Medication Sig Start Date End Date Taking? Authorizing Provider  cephALEXin  (KEFLEX ) 500 MG capsule Take 1 capsule (500 mg total) by mouth 3 (three) times daily for 7 days. 03/25/24 04/01/24 Yes Maat Kafer, PA-C  acetaminophen  (TYLENOL ) 500 MG tablet Take 500-1,000 mg by mouth every 6 (six) hours as needed for moderate pain.    [provider]  aspirin  EC 81 MG tablet Take 1 tablet (81 mg total) by mouth daily. Swallow whole. 07/17/22   Court Dorn PARAS, MD  carbidopa -levodopa  (SINEMET  IR) 25-100 MG tablet Take 1 tablet by mouth 3 (three) times daily. 03/29/23   Onita Duos, MD  cetirizine (ZYRTEC) 10 MG tablet Take 10 mg by mouth daily as needed for allergies.    [provider]  COLLAGEN PO Take 6 tablets by mouth daily.    [provider]  cyanocobalamin  (VITAMIN B12) 1000 MCG tablet Take 1,000 mcg by mouth daily.    [provider]  donepezil  (ARICEPT ) 10 MG tablet TAKE 1 TABLET (10 MG TOTAL) BY MOUTH DAILY AFTER SUPPER. APPOINTMENT NEEDED FOR FURTHER REFILLS 09/30/23   Onita Duos, MD  gabapentin  (NEURONTIN ) 300 MG capsule Take 300 mg by mouth at bedtime as needed (pain). 04/15/22   [provider]  inclisiran (LEQVIO ) 284 MG/1.5ML SOSY injection Inject at 0, 3 months then q 6 months thereafter 10/08/22   Court Dorn PARAS, MD  lidocaine  (LIDODERM ) 5 % Place 1 patch onto the skin daily as needed (pain). 12/11/22   [provider]  Melatonin 10 MG CAPS Take 20 mg by mouth at bedtime.    [provider]  metoprolol  tartrate (LOPRESSOR )  25 MG tablet Take 0.5 tablets (12.5 mg total) by mouth 2 (two) times daily. 03/08/23   Obadiah Coy, MD  omeprazole  (PRILOSEC  OTC) 20 MG tablet Take 20 mg by mouth in the morning.    [provider]  Polyvinyl Alcohol -Povidone PF (REFRESH) 1.4-0.6 % SOLN Place 1 drop into both eyes daily as needed (luberication and dry eyes).    [provider]  rivaroxaban  (XARELTO ) 20 MG TABS tablet Take 20 mg by mouth daily with supper.    [provider]  traMADol  (ULTRAM ) 50 MG tablet Take 1 tablet (50 mg total) by mouth every 6 (six) hours as needed. 01/28/23   Obadiah Coy, MD    Allergies: Lipitor [atorvastatin ] and Penicillins    Review of Systems  Updated Vital Signs BP (!) 140/81 (BP Location: Right Arm)   Pulse 95   Temp 98.4 F (36.9 C)   Resp 18   Ht 5' 7.5 (1.715 m)   Wt 74.4 kg   SpO2 97%   BMI 25.31 kg/m   Physical Exam Vitals and nursing note reviewed.  Constitutional:      General: He is not in acute distress.    Appearance: Normal appearance. He is not ill-appearing.  HENT:     Head: Normocephalic and atraumatic.     Nose: Nose normal.  Eyes:     Conjunctiva/sclera: Conjunctivae normal.  Cardiovascular:  Rate and Rhythm: Normal rate and regular rhythm.  Pulmonary:     Effort: Pulmonary effort is normal. No respiratory distress.  Abdominal:     General: There is no distension.     Palpations: Abdomen is soft.     Tenderness: There is no abdominal tenderness. There is no guarding.  Musculoskeletal:        General: No deformity.     Cervical back: Normal range of motion.     Comments: Cervical, thoracic, lumbar spine without tenderness to palpation.  Good range of motion in bilateral upper and lower extremities including all major joints.  He does have an abrasion over the right knee, left knee, and right forearm.  Compartments to all of these extremities are soft.  Neurovascularly intact in bilateral upper and lower extremities.   Skin:    Findings: No rash.     Comments: 3 cm laceration noted to right forehead extending onto the right eyebrow.  Neurological:     Mental Status: He is alert.     (all labs ordered are listed, but only abnormal results are displayed) Labs Reviewed - No data to display  EKG: None  Radiology: CT Cervical Spine Wo Contrast Result Date: 03/25/2024 EXAM: CT CERVICAL SPINE WITHOUT CONTRAST 03/25/2024 03:49:25 PM TECHNIQUE: CT of the cervical spine was performed without the administration of intravenous contrast. Multiplanar reformatted images are provided for review. Automated exposure control, iterative reconstruction, and/or weight based adjustment of the mA/kV was utilized to reduce the radiation dose to as low as reasonably achievable. COMPARISON: CT of the cervical spine 11/29/2015. CLINICAL HISTORY: Neck trauma (Age >= 65y). Head trauma, minor (Age >= 65y); Neck trauma (Age >= 65y); Fall on a hiking trail today. Patient tripped while walking on trail. FINDINGS: CERVICAL SPINE: BONES AND ALIGNMENT: No acute fracture or traumatic malalignment. Straightening of the normal cervical lordosis is present. DEGENERATIVE CHANGES: Solid fusion is present at C5-C6 and C6-C7. Progressive endplate degenerative change and uncovertebral spurring is present at C3-C4. SOFT TISSUES: No prevertebral soft tissue swelling. IMPRESSION: 1. No acute abnormality of the cervical spine related to the reported trauma. 2. Solid fusion at C5-6 and C6-7. 3. Straightening of the cervical lordosis. 4. Progressive endplate degenerative change and uncovertebral spurring at C3-4. Electronically signed by: Lonni Necessary MD 03/25/2024 04:10 PM EDT RP Workstation: [id number]   DG Knee Complete 4 Views Right Result Date: 03/25/2024 CLINICAL DATA:  Right knee pain after fall. EXAM: RIGHT KNEE - COMPLETE 4+ VIEW COMPARISON:  None Available. FINDINGS: Medial tibiofemoral joint space narrowing. Minimal tricompartmental  peripheral spurring. No fracture, erosion, or focal bone abnormality. No joint effusion. Unremarkable soft tissues. IMPRESSION: 1. No fracture or subluxation of the right knee. 2. Mild tricompartmental osteoarthritis. Electronically Signed   By: Andrea Gasman M.D.   On: 03/25/2024 16:10   CT Head Wo Contrast Result Date: 03/25/2024 EXAM: CT HEAD WITHOUT CONTRAST 03/25/2024 03:49:25 PM TECHNIQUE: CT of the head was performed without the administration of intravenous contrast. Automated exposure control, iterative reconstruction, and/or weight based adjustment of the mA/kV was utilized to reduce the radiation dose to as low as reasonably achievable. COMPARISON: None available. CLINICAL HISTORY: Head trauma, minor (Age >= 65y). Patient tripped while walking on a trail. Abrasions to right side of the face. FINDINGS: BRAIN AND VENTRICLES: No acute hemorrhage. No evidence of acute infarct. No hydrocephalus. No extra-axial collection. No mass effect or midline shift. Age-related atrophy. Moderate vascular calcifications within the carotid siphons. ORBITS: Status post bilateral lens replacement.  Minimal right periorbital soft tissue swelling is present. SINUSES: No acute abnormality. SOFT TISSUES AND SKULL: Minimal right periorbital soft tissue swelling is present without underlying fracture. No skull fracture. IMPRESSION: 1. No acute intracranial abnormality. 2. Minimal right periorbital soft tissue swelling without underlying fracture. Electronically signed by: Lonni Necessary MD 03/25/2024 04:05 PM EDT RP Workstation: [id number]    {Document cardiac monitor, telemetry assessment procedure when appropriate:32947} .SABRALac repair Afsheen Antony  Date/Time: 03/25/2024 5:46 PM  Performed by: Hildegard Loge, PA-C Authorized by: Hildegard Loge, PA-C   Consent:    Consent obtained:  Verbal   Consent given by:  Patient   Risks discussed:  Need for additional repair, infection, retained foreign body, poor cosmetic result and  poor wound healing   Alternatives discussed:  No treatment Universal protocol:    Procedure explained and questions answered to patient or proxy's satisfaction: yes     Relevant documents present and verified: yes     Patient identity confirmed:  Verbally with patient and arm band Anesthesia:    Anesthesia method:  Local infiltration   Local anesthetic:  Lidocaine  2% WITH epi Laceration details:    Location:  Face   Face location:  Forehead   Length (cm):  3 Pre-procedure details:    Preparation:  Patient was prepped and draped in usual sterile fashion Treatment:    Area cleansed with:  Saline and povidone-iodine    Amount of cleaning:  Extensive   Irrigation solution:  Sterile saline   Irrigation volume:  500   Irrigation method:  Tap   Debridement:  None   Undermining:  None Skin repair:    Repair method:  Sutures   Suture size:  5-0   Suture material:  Prolene   Suture technique:  Simple interrupted   Number of sutures:  3 Approximation:    Approximation:  Close Repair type:    Repair type:  Simple Post-procedure details:    Dressing:  Non-adherent dressing and antibiotic ointment   Procedure completion:  Tolerated well, no immediate complications .SABRALac repair [place] Serpe  Date/Time: 03/25/2024 5:47 PM  Performed by: Hildegard Loge, PA-C Authorized by: Hildegard Loge, PA-C   Consent:    Consent obtained:  Verbal   Consent given by:  Patient   Risks discussed:  Need for additional repair, infection, retained foreign body, poor cosmetic result and poor wound healing   Alternatives discussed:  No treatment Universal protocol:    Procedure explained and questions answered to patient or proxy's satisfaction: yes     Relevant documents present and verified: yes     Patient identity confirmed:  Verbally with patient and arm band Laceration details:    Location:  Shoulder/arm   Shoulder/arm location:  R lower arm   Length (cm):  4 Pre-procedure details:    Preparation:  Patient  was prepped and draped in usual sterile fashion Treatment:    Debridement:  None   Undermining:  None Skin repair:    Repair method:  Steri-Strips   Number of Steri-Strips:  3 Approximation:    Approximation:  Close Repair type:    Repair type:  Simple Post-procedure details:    Dressing:  Non-adherent dressing   Procedure completion:  Tolerated well, no immediate complications Comments:     Skin tear that was approximated and Steri-Stripped in place.    Medications Ordered in the ED  bacitracin  ointment (has no administration in time range)  lidocaine -EPINEPHrine  (XYLOCAINE  W/EPI) 2 %-1:200000 (PF) injection 10 mL (10 mLs Infiltration Given by Other  03/25/24 1636)      {Click here for ABCD2, HEART and other calculators REFRESH Note before signing:1}                              Medical Decision Making Amount and/or Complexity of Data Reviewed Radiology: ordered.  Risk OTC drugs. Prescription drug management.   Medical Decision Making / ED Course   This patient presents to the ED for concern of fall, this involves an extensive number of treatment options, and is a complaint that carries with it a high risk of complications and morbidity.  The differential diagnosis includes head injury, concussion, laceration, fracture, contusion  MDM: 73 year old male presents today for concern of a fall that occurred while he was on a trail behind his house.  He has history of Parkinson's.  He went down a steep section and lost his footing causing him to fall.  No loss of consciousness.  He is on aspirin  but no other anticoagulant.  Laceration noted to right forehead extending onto the right eyebrow.  Multiple abrasions noted.  Most notably on the right forearm which still has the skin intact that needs to be approximated. Extensive irrigation done of the laceration.  No foreign bodies noted.  CT head, C-spine did not note any acute findings. Knee x-ray without acute bony  abnormality.  Laceration repaired.  Wound care discussed.  Return precautions discussed at length.  Keflex  started for potential dirty wound.  Although extensively irrigated in the department.  Discharged in stable condition.  Return precaution discussed.  Patient voices understanding and is in agreement with plan.    Additional history obtained: -Additional history obtained from wife at bedside -External records from outside source obtained and reviewed including: Chart review including previous notes, labs, imaging, consultation notes   Lab Tests: -I ordered, reviewed, and interpreted labs.   The pertinent results include:   Labs Reviewed - No data to display    EKG  EKG Interpretation Date/Time:    Ventricular Rate:    PR Interval:    QRS Duration:    QT Interval:    QTC Calculation:   R Axis:      Text Interpretation:           Imaging Studies ordered: I ordered imaging studies including CT head, CT C-spine, right knee x-ray I independently visualized and interpreted imaging. I agree with the radiologist interpretation   Medicines ordered and prescription drug management: Meds ordered this encounter  Medications  . lidocaine -EPINEPHrine  (XYLOCAINE  W/EPI) 2 %-1:200000 (PF) injection 10 mL  . bacitracin  ointment  . cephALEXin  (KEFLEX ) 500 MG capsule    Sig: Take 1 capsule (500 mg total) by mouth 3 (three) times daily for 7 days.    Dispense:  21 capsule    Refill:  0    Tolerated this most recently in May 2025 without any difficulty.    Supervising Provider:   CLEOTILDE ROGUE [[id number]]    -I have reviewed the patients home medicines and have made adjustments as needed   Reevaluation: After the interventions noted above, I reevaluated the patient and found that they have :improved Offered Tylenol  but he declined.  Does not want anything for pain at this time.  Co morbidities that complicate the patient evaluation . Past Medical History:  Diagnosis Date   . Arthritis    neck, back, knees, right ankle  . Bladder calculus   . Chronic back pain   .  Coronary artery disease   . GERD (gastroesophageal reflux disease)   . History of colon polyps    benign  . History of kidney stones    and bladder stone  . History of prostate cancer urologist-- dr renda   dx 2014---  s/p  prostatectomy 05-22-2013 ,  Gleason 3+3  . Hypercholesterolemia    takes Crestor  daily  . Hyperlipidemia   . Hypertension   . Pneumonia 10/2019  . Pulmonary emboli (HCC) 10/2019   x2  . Wears glasses       Dispostion: Discharged in stable condition.  Return precaution discussed.  Patient voices understanding and is in agreement with plan.  Final diagnoses:  Fall, initial encounter  Laceration of forehead, initial encounter  Abrasion of right forearm, initial encounter  Injury of head, initial encounter    ED Discharge Orders          Ordered    cephALEXin  (KEFLEX ) 500 MG capsule  3 times daily       Note to Pharmacy: Tolerated this most recently in May 2025 without any difficulty.   03/25/24 1719

## 2024-03-25 NOTE — ED Triage Notes (Signed)
 Pt fell while walking on a trail today; abrasions and skin tears noted to RT side face, RUE; does not take blood thinners; c/o pain to RT knee

## 2024-03-25 NOTE — Discharge Instructions (Addendum)
 Your CT scan of the head, neck, and the x-ray did not show any concerning findings.  Your forehead laceration was repaired with 3 stitches.  These will need to be removed in 7 days.  You can have your primary care doctor remove these or return to the emergency department.  I have sent antibiotic into the pharmacy to keep this from getting infected in case there was a retained object in there that we were unable to identify when cleaning the wound.  If you notice any purulent drainage, worsening redness surrounding the wound or fever without any other source please return for evaluation.  Apply Neosporin over the wound.

## 2024-03-25 NOTE — ED Notes (Signed)
 ED Provider at bedside.

## 2024-03-31 ENCOUNTER — Other Ambulatory Visit: Payer: Self-pay | Admitting: Neurology

## 2024-04-03 DIAGNOSIS — S0181XA Laceration without foreign body of other part of head, initial encounter: Secondary | ICD-10-CM | POA: Diagnosis not present

## 2024-04-03 DIAGNOSIS — Z9181 History of falling: Secondary | ICD-10-CM | POA: Diagnosis not present

## 2024-04-03 DIAGNOSIS — M5416 Radiculopathy, lumbar region: Secondary | ICD-10-CM | POA: Diagnosis not present

## 2024-04-03 DIAGNOSIS — Z4802 Encounter for removal of sutures: Secondary | ICD-10-CM | POA: Diagnosis not present

## 2024-04-03 DIAGNOSIS — G20A1 Parkinson's disease without dyskinesia, without mention of fluctuations: Secondary | ICD-10-CM | POA: Diagnosis not present

## 2024-04-03 DIAGNOSIS — Z23 Encounter for immunization: Secondary | ICD-10-CM | POA: Diagnosis not present

## 2024-04-10 ENCOUNTER — Encounter: Payer: Self-pay | Admitting: Adult Health

## 2024-04-10 ENCOUNTER — Ambulatory Visit (INDEPENDENT_AMBULATORY_CARE_PROVIDER_SITE_OTHER): Payer: Medicare Other | Admitting: Adult Health

## 2024-04-10 ENCOUNTER — Encounter: Payer: Self-pay | Admitting: Cardiovascular Disease

## 2024-04-10 VITALS — BP 143/86 | HR 60 | Ht 67.0 in | Wt 168.4 lb

## 2024-04-10 DIAGNOSIS — R419 Unspecified symptoms and signs involving cognitive functions and awareness: Secondary | ICD-10-CM

## 2024-04-10 DIAGNOSIS — G20C Parkinsonism, unspecified: Secondary | ICD-10-CM | POA: Diagnosis not present

## 2024-04-10 NOTE — Progress Notes (Signed)
 PATIENT: Terry [name] DOB: 26-Aug-1950  REASON FOR VISIT: follow up HISTORY FROM: patient PRIMARY NEUROLOGIST: Dr. Onita   Chief Complaint  Patient presents with   Follow-up    Pt in 4 with wife Pt here for PD/memory f/u  Pt states fell 2 weeks ago and has a black eye. Wife states pt short term memory is worse Pt no longer drives Pt had open heart surgery Feb 2024      HISTORY OF PRESENT ILLNESS:  HISTORY (Copied from Dr. Georgianne note) Terry [name] is a 73 year old male, seen in request by neurosurgeon Dr. Colon Shove, for evaluation of possible Parkinson's disease, his primary care physician is Dr. Onita Rush, MD    I reviewed and summarized the referring note. PMHX. Memory loss GERD PE, on xarelto  History of kidney and bladder stone. Prostate cancer Cervical decompression surgery Multiple lumbar decompression surgery   Patient noticed right hand shaking around 2022, gradually getting worse, he has mild gait abnormality, but still able to walk up to 5 miles a day, he had multiple lumbar decompression surgery in the past, continued to experience low back pain  In addition he suffered right Achilles tendon injury in the past, postsurgical infection, required prolonged healing, right Achilles tendon continues to bother him intermittently  He denies loss sense of smell, denies significant difficulty sleeping, denies orthostatic hypotension,   His sister of 88 years old, has mild bilateral hand shaking, otherwise there is no family history of tremor   Personally reviewed MRI of the brain in January 2022, no acute intracranial abnormality, old right cerebellar infarction, small vessel disease  Labs in February 2023, normal CBC, CMP,   UPDATE Sept 18 2023: He is accompanied by his wife at today's visit, tolerating Sinemet  25/100 mg 3 times a day well, does notice mild improvement, has less hand shaking,   [employer]  May 2023 showed decreased radiotracer activity within  striata consistent with Parkinson syndrome pathology, greater loss activity on the right,   He continue has mild memory loss, MoCA examination today is 18 out of 30, noticeable visual-spatial executive dysfunction, slow reaction time, short-term memory loss, he denies hallucinations, does complains of excessive day time fatigue and drowsiness,   He continue has mild gait abnormality, which is certainly complicated with his worsening low back pain, radiating pain to right lower extremity, we personally reviewed MRI of lumbar spine in September 2023, new right foraminal disc extrusion with superior migration at L2-3 with severe right neuroforaminal stenosis, going to be reevaluated by Dr. Colon soon     UPDATE 04/08/23(MM)  Terry [name] is a 73 y.o. male who has been followed in this office for Parkinson's disease and memory disturbance. Returns today for follow-up.  Overall he feels relatively stable.  He did have triple bypass in March.  Denies any changes with his gait or balance.  Reports that tremor is relatively stable.  States that in the last 3 weeks it may have been slightly worse but correlates that with anxiety.  No trouble chewing or swallowing food.  Reports that he is sleeping well.  He states that since his triple bypass he no longer snores does not have any trouble falling asleep.  He does not want to proceed with a sleep study at this time.  He feels that his memory is stable.  Remains on Aricept  10 mg at bedtime.   04/10/24 (MM)  Terry [name] is a 73 y.o. male with a history of Parkinson disease  and memory disturbance. Returns today for follow-up.  In regards to his Parkinson's he feels that he is relatively stable.  He states that he most recently had a fall while out walking on a walking trail.  He states that he fell forward and had to get stitches and has a black eye on the right side.  Reports that he did go to the ED for evaluation.  He feels that his tremor is relatively  stable.  Worse when he is tired or after strenuous activity.  Denies any trouble chewing or swallowing food.  Reports that he sleeps fairly well.  Feels that his memory is stable.  He states that he may be more fuzzy after his recent fall.  Able to complete all ADLs at home.  He manages his own medications appointments and finances.  His wife does offer him some assistance but he reports that he can do it himself if needed.  No longer driving.  Returns today for an evaluation.   REVIEW OF SYSTEMS: Out of a complete 14 system review of symptoms, the patient complains only of the following symptoms, and all other reviewed systems are negative.  ALLERGIES: Allergies  Allergen Reactions   Lipitor [Atorvastatin ] Other (See Comments)    Muscle issues   Penicillins Hives, Itching and Other (See Comments)    HOME MEDICATIONS: Outpatient Medications Prior to Visit  Medication Sig Dispense Refill   acetaminophen  (TYLENOL ) 500 MG tablet Take 500-1,000 mg by mouth every 6 (six) hours as needed for moderate pain.     aspirin  EC 81 MG tablet Take 1 tablet (81 mg total) by mouth daily. Swallow whole.     carbidopa -levodopa  (SINEMET  IR) 25-100 MG tablet TAKE 1 TABLET BY MOUTH THREE TIMES A DAY 270 tablet 3   cetirizine (ZYRTEC) 10 MG tablet Take 10 mg by mouth daily as needed for allergies.     cyanocobalamin  (VITAMIN B12) 1000 MCG tablet Take 1,000 mcg by mouth daily.     donepezil  (ARICEPT ) 10 MG tablet TAKE 1 TABLET (10 MG TOTAL) BY MOUTH DAILY AFTER SUPPER. APPOINTMENT NEEDED FOR FURTHER REFILLS 90 tablet 1   gabapentin  (NEURONTIN ) 300 MG capsule Take 300 mg by mouth at bedtime as needed (pain).     inclisiran (LEQVIO ) 284 MG/1.5ML SOSY injection Inject at 0, 3 months then q 6 months thereafter 1.5 mL 0   lidocaine  (LIDODERM ) 5 % Place 1 patch onto the skin daily as needed (pain).     Melatonin 10 MG CAPS Take 20 mg by mouth at bedtime.     metoprolol  tartrate (LOPRESSOR ) 25 MG tablet Take 0.5  tablets (12.5 mg total) by mouth 2 (two) times daily. 60 tablet 4   omeprazole  (PRILOSEC  OTC) 20 MG tablet Take 20 mg by mouth in the morning.     Polyvinyl Alcohol -Povidone PF (REFRESH) 1.4-0.6 % SOLN Place 1 drop into both eyes daily as needed (luberication and dry eyes).     traMADol  (ULTRAM ) 50 MG tablet Take 1 tablet (50 mg total) by mouth every 6 (six) hours as needed. 28 tablet 0   COLLAGEN PO Take 6 tablets by mouth daily.     rivaroxaban  (XARELTO ) 20 MG TABS tablet Take 20 mg by mouth daily with supper.     No facility-administered medications prior to visit.    PAST MEDICAL HISTORY: Past Medical History:  Diagnosis Date   Arthritis    neck, back, knees, right ankle   Bladder calculus    Chronic back pain  Coronary artery disease    GERD (gastroesophageal reflux disease)    History of colon polyps    benign   History of kidney stones    and bladder stone   History of prostate cancer urologist-- dr renda   dx 2014---  s/p  prostatectomy 05-22-2013 ,  Gleason 3+3   Hypercholesterolemia    takes Crestor  daily   Hyperlipidemia    Hypertension    Pneumonia 10/2019   Pulmonary emboli (HCC) 10/2019   x2   Wears glasses     PAST SURGICAL HISTORY: Past Surgical History:  Procedure Laterality Date   ACHILLES TENDON SURGERY Right 06-07-2018   @[facility]    reconstruction   ANTERIOR CERVICAL DECOMP/DISCECTOMY FUSION N/A 06/27/2014   Procedure: ANTERIOR CERVICAL DECOMPRESSION/DISCECTOMY FUSION C5-C7   (2 LEVELS);  Surgeon: Donaciano Sprang, MD;  Location[facility] OR;  Service: Orthopedics;  Laterality: N/A;   back fusion  03/2022   x2   CARDIAC CATHETERIZATION  2004   CLIPPING OF ATRIAL APPENDAGE Left 08/04/2022   Procedure: CLIPPING OF ATRIAL APPENDAGE USING AN ATRICURE 45 ATRICLIP;  Surgeon: Murriel Toribio DEL, MD;  Location: [facility] OR;  Service: Open Heart Surgery;  Laterality: Left;   COLONOSCOPY     CORONARY ARTERY BYPASS GRAFT N/A 08/04/2022   Procedure: CORONARY ARTERY BYPASS  GRAFTING (CABG) X THREE USING LEFT INTERNAL MAMMARY ARTERY (LIMA) AND ENDOSCOPICALLY HARVESTED RIGHT GREATER SAPHENOUS VEIN.;  Surgeon: Murriel Toribio DEL, MD;  Location: [facility] OR;  Service: Open Heart Surgery;  Laterality: N/A;   CYSTOSCOPY WITH LITHOLAPAXY N/A 03/27/2019   Procedure: CYSTOSCOPY WITH REMOVAL OF BLADDER STONE AND FOREIGN BODY;  Surgeon: Renda Glance, MD;  Location: [facility];  Service: Urology;  Laterality: N/A;   EYE SURGERY     lazy eye as child, lasik surgery   FINGER SURGERY Right    4th finger   I & D EXTREMITY Right 07/10/2018   Procedure: IRRIGATION AND DEBRIDEMENT RIGHT ANKLE WOUND AND WOUND VAC PLACEMENT;  Surgeon: Kit Rush, MD;  Location: [facility] OR;  Service: Orthopedics;  Laterality: Right;   I & D EXTREMITY Right 01/14/2023   Procedure: IRRIGATION AND DEBRIDEMENT RIGHT LOWER EXTREMITY SEROMA;  Surgeon: Shyrl Linnie KIDD, MD;  Location: [facility] OR;  Service: Vascular;  Laterality: Right;   KNEE ARTHROSCOPY Bilateral right ?;  left 05-25-2007  @[facility]    LEFT HEART CATH AND CORONARY ANGIOGRAPHY N/A 07/20/2022   Procedure: LEFT HEART CATH AND CORONARY ANGIOGRAPHY;  Surgeon: Court Dorn PARAS, MD;  Location: [facility] INVASIVE CV LAB;  Service: Cardiovascular;  Laterality: N/A;   POSTERIOR LAMINECTOMY / DECOMPRESSION LUMBAR SPINE  08-31-2016    dr elsner  @[facility]    PROSTATE BIOPSY  04/13/2013   gleason 3+3=6, vol 55.4 cc   ROBOT ASSISTED LAPAROSCOPIC RADICAL PROSTATECTOMY N/A 05/22/2013   Procedure: ROBOTIC ASSISTED LAPAROSCOPIC RADICAL PROSTATECTOMY LEVEL 1;  Surgeon: Noretta Renda, MD;  Location: [facility] ORS;  Service: Urology;  Laterality: N/A;   TEE WITHOUT CARDIOVERSION N/A 08/04/2022   Procedure: TRANSESOPHAGEAL ECHOCARDIOGRAM;  Surgeon: Murriel Toribio DEL, MD;  Location: [facility] OR;  Service: Open Heart Surgery;  Laterality: N/A;   TONSILLECTOMY      FAMILY HISTORY: Family History  Problem Relation Age of Onset   Pneumonia Mother        bacterial   Alzheimer's disease Mother     Heart disease Father    Heart attack Father    Dementia Maternal Grandmother    Colon cancer Neg Hx    Sleep apnea Neg Hx  Parkinson's disease Neg Hx     SOCIAL HISTORY: Social History   Socioeconomic History   Marital status: Married    Spouse name: Leeroy   Number of children: 2   Years of education: Not on file   Highest education level: Not on file  Occupational History   Occupation: retired  Tobacco Use   Smoking status: Never   Smokeless tobacco: Never  Vaping Use   Vaping status: Never Used  Substance and Sexual Activity   Alcohol  use: Not Currently    Comment: maybe 1 beer per month   Drug use: Never   Sexual activity: Not Currently    Comment: vasectomy  Other Topics Concern   Not on file  Social History Narrative   Pt lives with wife    Retired    Chief Executive Officer Drivers of [employer] Strain: Not on file  Food Insecurity: No Food Insecurity (08/08/2022)   Hunger Vital Sign    Worried About Running Out of Food in the Last Year: Never true    Ran Out of Food in the Last Year: Never true  Transportation Needs: No Transportation Needs (08/08/2022)   PRAPARE - Administrator, Civil Service (Medical): No    Lack of Transportation (Non-Medical): No  Physical Activity: Not on file  Stress: Not on file  Social Connections: Unknown (10/14/2021)   Received from [facility]   Social Network    Social Network: Not on file  Intimate Partner Violence: Not At Risk (08/08/2022)   Humiliation, Afraid, Rape, and Kick questionnaire    Fear of Current or Ex-Partner: No    Emotionally Abused: No    Physically Abused: No    Sexually Abused: No      PHYSICAL EXAM  Vitals:   04/10/24 0941  BP: (!) 143/86  Pulse: 60  Weight: 168 lb 6.4 oz (76.4 kg)  Height: 5' 7 (1.702 m)   Body mass index is 26.38 kg/m.     04/08/2023    9:19 AM 02/16/2022   10:00 AM 09/29/2021    9:49 AM  Montreal Cognitive Assessment   Visuospatial/ Executive (0/5) 1  1 2   Naming (0/3) 3 3 3   Attention: Read list of digits (0/2) 2 2 2   Attention: Read list of letters (0/1) 1 1 1   Attention: Serial 7 subtraction starting at 100 (0/3) 2 1 2   Language: Repeat phrase (0/2) 2 2 1   Language : Fluency (0/1) 1 1 1   Abstraction (0/2) 2 2 2   Delayed Recall (0/5) 3 0 0  Orientation (0/6) 5 5 4   Total 22 18 18   Adjusted Score (based on education)   18      04/10/2024    9:50 AM  MMSE - Mini Mental State Exam  Orientation to time 5  Orientation to Place 4  Registration 3  Attention/ Calculation 1  Recall 3  Language- name 2 objects 2  Language- repeat 1  Language- follow 3 step command 3  Language- read & follow direction 1  Write a sentence 1  Copy design 0  Total score 24      Generalized: Well developed, in no acute distress   Neurological examination  Mentation: Alert oriented to time, place, history taking. Follows all commands speech and language fluent Cranial nerve II-XII: Pupils were equal round reactive to light. Extraocular movements were full, visual field were full on confrontational test. Facial sensation and strength were normal. . Head turning and shoulder shrug  were normal and symmetric. Motor: The motor testing reveals 5 over 5 strength of all 4 extremities. Good symmetric motor tone is noted throughout.   Sensory: Sensory testing is intact to soft touch on all 4 extremities. No evidence of extinction is noted.  Coordination: Cerebellar testing reveals good finger-nose-finger and heel-to-shin bilaterally.  Mild tremor noted in the upper extremities Gait and station: Able to stand without assistance.  Good stride.  Decreased arm swing on the right 2-3 steps with turns. Reflexes: Deep tendon reflexes are symmetric and normal bilaterally.   DIAGNOSTIC DATA (LABS, IMAGING, TESTING) - I reviewed patient records, labs, notes, testing and imaging myself where available.  Lab Results  Component Value Date   WBC 12.5 (H) 01/12/2023    HGB 13.7 01/12/2023   HCT 44.9 01/12/2023   MCV 75.2 (L) 01/12/2023   PLT 374 01/12/2023      Component Value Date/Time   NA 139 01/12/2023 1458   NA 140 07/09/2022 1056   K 4.2 01/12/2023 1458   CL 101 01/12/2023 1458   CO2 27 01/12/2023 1458   GLUCOSE 120 (H) 01/12/2023 1458   BUN 25 (H) 01/12/2023 1458   BUN 19 07/09/2022 1056   CREATININE 0.95 01/12/2023 1458   CREATININE 1.02 07/07/2021 1406   CALCIUM  9.4 01/12/2023 1458   PROT 6.7 01/12/2023 1458   ALBUMIN  3.7 01/12/2023 1458   AST 19 01/12/2023 1458   AST 24 07/07/2021 1406   ALT 17 01/12/2023 1458   ALT 25 07/07/2021 1406   ALKPHOS 79 01/12/2023 1458   BILITOT 0.6 01/12/2023 1458   BILITOT 0.8 07/07/2021 1406   GFRNONAA >60 01/12/2023 1458   GFRNONAA >60 07/07/2021 1406   GFRAA >60 07/13/2018 0308   Lab Results  Component Value Date   CHOL 68 (L) 12/10/2022   HDL 46 12/10/2022   LDLCALC 0 12/10/2022   TRIG 121 12/10/2022   CHOLHDL 1.5 12/10/2022   Lab Results  Component Value Date   HGBA1C 6.2 (H) 07/31/2022   Lab Results  Component Value Date   VITAMINB12 237 09/29/2021   Lab Results  Component Value Date   TSH 2.520 09/29/2021      ASSESSMENT AND PLAN 73 y.o. year old male  has a past medical history of Arthritis, Bladder calculus, Chronic back pain, Coronary artery disease, GERD (gastroesophageal reflux disease), History of colon polyps, History of kidney stones, History of prostate cancer (urologist-- dr renda), Hypercholesterolemia, Hyperlipidemia, Hypertension, Pneumonia (10/2019), Pulmonary emboli (HCC) (10/2019), and Wears glasses. here with:  1.  Parkinson's disease 2.  Memory disturbance  -Continue Sinemet  25-100 mg 3 times daily -Memory score stable -Continue Aricept  10 mg at bedtime -Advised if symptoms worsen or she develops new symptoms he should let us  know -Follow-up in 6 to 8 months or sooner if needed   Duwaine Russell, MSN, NP-C 04/10/2024, 9:59 AM [facility] [address] [place], [place] [address] [phone]  The patient's condition requires frequent monitoring and adjustments in the treatment plan, reflecting the ongoing complexity of care.  This provider is the continuing focal point for all needed services for this condition.

## 2024-04-10 NOTE — Patient Instructions (Signed)
 Your Plan:  Continue Sinemet  and Aricept  If you have any additional falls please let us  know If your symptoms worsen or you develop new symptoms please let us  know.       Thank you for coming to see us  at [facility]. I hope we have been able to provide you high quality care today.  You may receive a patient satisfaction survey over the next few weeks. We would appreciate your feedback and comments so that we may continue to improve ourselves and the health of our patients.

## 2024-04-13 DIAGNOSIS — Z961 Presence of intraocular lens: Secondary | ICD-10-CM | POA: Diagnosis not present

## 2024-04-13 DIAGNOSIS — H35371 Puckering of macula, right eye: Secondary | ICD-10-CM | POA: Diagnosis not present

## 2024-04-13 DIAGNOSIS — H53002 Unspecified amblyopia, left eye: Secondary | ICD-10-CM | POA: Diagnosis not present

## 2024-04-18 DIAGNOSIS — Z1212 Encounter for screening for malignant neoplasm of rectum: Secondary | ICD-10-CM | POA: Diagnosis not present

## 2024-04-18 DIAGNOSIS — E7849 Other hyperlipidemia: Secondary | ICD-10-CM | POA: Diagnosis not present

## 2024-04-18 DIAGNOSIS — E538 Deficiency of other specified B group vitamins: Secondary | ICD-10-CM | POA: Diagnosis not present

## 2024-05-11 DIAGNOSIS — H524 Presbyopia: Secondary | ICD-10-CM | POA: Diagnosis not present

## 2024-05-11 DIAGNOSIS — H5340 Unspecified visual field defects: Secondary | ICD-10-CM | POA: Diagnosis not present

## 2024-06-12 ENCOUNTER — Telehealth: Payer: Self-pay

## 2024-06-12 NOTE — Telephone Encounter (Signed)
 Auth Submission: NO AUTH NEEDED Site of care: Site of care: [facility] Payer: Medicare A/B with BCBS supplement Medication & CPT/J Code(s) submitted: Leqvio  (Inclisiran) J1306 Diagnosis Code:  Route of submission (phone, fax, portal):  Phone # Fax # Auth type: Buy/Bill PB Units/visits requested: 284mg  x 2 doses Reference number:  Approval from: 06/12/24 to 07/01/25

## 2024-06-26 ENCOUNTER — Ambulatory Visit: Admitting: Cardiovascular Disease

## 2024-06-30 ENCOUNTER — Other Ambulatory Visit: Payer: Self-pay | Admitting: Neurological Surgery

## 2024-06-30 DIAGNOSIS — M4316 Spondylolisthesis, lumbar region: Secondary | ICD-10-CM

## 2024-07-01 ENCOUNTER — Other Ambulatory Visit

## 2024-07-13 ENCOUNTER — Other Ambulatory Visit

## 2024-07-26 ENCOUNTER — Ambulatory Visit

## 2024-09-04 ENCOUNTER — Ambulatory Visit: Admitting: Cardiovascular Disease

## 2025-01-18 ENCOUNTER — Ambulatory Visit: Admitting: Adult Health
